# Patient Record
Sex: Female | Born: 1950 | Race: Black or African American | Hispanic: No | Marital: Married
Health system: Southern US, Community
[De-identification: ages and names within clinical notes are randomized; demographics above are authoritative.]

## PROBLEM LIST (undated history)

## (undated) DIAGNOSIS — Z923 Personal history of irradiation: Secondary | ICD-10-CM

## (undated) DIAGNOSIS — E119 Type 2 diabetes mellitus without complications: Secondary | ICD-10-CM

## (undated) DIAGNOSIS — Z8669 Personal history of other diseases of the nervous system and sense organs: Secondary | ICD-10-CM

## (undated) DIAGNOSIS — M1990 Unspecified osteoarthritis, unspecified site: Secondary | ICD-10-CM

## (undated) DIAGNOSIS — G4710 Hypersomnia, unspecified: Secondary | ICD-10-CM

## (undated) DIAGNOSIS — T7840XA Allergy, unspecified, initial encounter: Secondary | ICD-10-CM

## (undated) DIAGNOSIS — Z9989 Dependence on other enabling machines and devices: Secondary | ICD-10-CM

## (undated) DIAGNOSIS — G4733 Obstructive sleep apnea (adult) (pediatric): Secondary | ICD-10-CM

## (undated) DIAGNOSIS — R42 Dizziness and giddiness: Secondary | ICD-10-CM

## (undated) DIAGNOSIS — Z9221 Personal history of antineoplastic chemotherapy: Secondary | ICD-10-CM

## (undated) DIAGNOSIS — K5790 Diverticulosis of intestine, part unspecified, without perforation or abscess without bleeding: Secondary | ICD-10-CM

## (undated) DIAGNOSIS — I10 Essential (primary) hypertension: Secondary | ICD-10-CM

## (undated) DIAGNOSIS — C50911 Malignant neoplasm of unspecified site of right female breast: Secondary | ICD-10-CM

## (undated) DIAGNOSIS — Z8543 Personal history of malignant neoplasm of ovary: Secondary | ICD-10-CM

## (undated) DIAGNOSIS — Z9889 Other specified postprocedural states: Secondary | ICD-10-CM

## (undated) DIAGNOSIS — R112 Nausea with vomiting, unspecified: Secondary | ICD-10-CM

## (undated) DIAGNOSIS — J309 Allergic rhinitis, unspecified: Secondary | ICD-10-CM

## (undated) DIAGNOSIS — F329 Major depressive disorder, single episode, unspecified: Secondary | ICD-10-CM

## (undated) DIAGNOSIS — Z8489 Family history of other specified conditions: Secondary | ICD-10-CM

## (undated) DIAGNOSIS — G629 Polyneuropathy, unspecified: Secondary | ICD-10-CM

## (undated) DIAGNOSIS — F32A Depression, unspecified: Secondary | ICD-10-CM

## (undated) DIAGNOSIS — E785 Hyperlipidemia, unspecified: Secondary | ICD-10-CM

## (undated) DIAGNOSIS — Z8639 Personal history of other endocrine, nutritional and metabolic disease: Secondary | ICD-10-CM

## (undated) HISTORY — DX: Allergy, unspecified, initial encounter: T78.40XA

## (undated) HISTORY — PX: TUBAL LIGATION: SHX77

## (undated) HISTORY — PX: TONSILLECTOMY: SUR1361

## (undated) HISTORY — PX: EYE SURGERY: SHX253

## (undated) HISTORY — DX: Essential (primary) hypertension: I10

## (undated) HISTORY — PX: COLONOSCOPY: SHX174

## (undated) HISTORY — DX: Major depressive disorder, single episode, unspecified: F32.9

## (undated) HISTORY — DX: Personal history of other endocrine, nutritional and metabolic disease: Z86.39

## (undated) HISTORY — DX: Personal history of other diseases of the nervous system and sense organs: Z86.69

## (undated) HISTORY — DX: Hypersomnia, unspecified: G47.10

## (undated) HISTORY — DX: Allergic rhinitis, unspecified: J30.9

## (undated) HISTORY — DX: Depression, unspecified: F32.A

## (undated) HISTORY — DX: Hyperlipidemia, unspecified: E78.5

## (undated) HISTORY — DX: Obstructive sleep apnea (adult) (pediatric): G47.33

## (undated) HISTORY — DX: Type 2 diabetes mellitus without complications: E11.9

## (undated) HISTORY — DX: Personal history of malignant neoplasm of ovary: Z85.43

## (undated) HISTORY — DX: Dependence on other enabling machines and devices: Z99.89

---

## 1993-02-02 HISTORY — PX: TOTAL ABDOMINAL HYSTERECTOMY W/ BILATERAL SALPINGOOPHORECTOMY: SHX83

## 1995-09-05 HISTORY — PX: ABDOMINAL ADHESION SURGERY: SHX90

## 1995-09-05 HISTORY — PX: LAPAROSCOPIC SALPINGO OOPHERECTOMY: SHX5927

## 1998-10-05 HISTORY — PX: BREAST BIOPSY: SHX20

## 1999-04-25 ENCOUNTER — Other Ambulatory Visit: Admission: RE | Admit: 1999-04-25 | Discharge: 1999-04-25 | Payer: Self-pay | Admitting: Obstetrics and Gynecology

## 1999-05-06 ENCOUNTER — Other Ambulatory Visit: Admission: RE | Admit: 1999-05-06 | Discharge: 1999-05-06 | Payer: Self-pay | Admitting: Podiatry

## 1999-06-16 ENCOUNTER — Encounter (HOSPITAL_BASED_OUTPATIENT_CLINIC_OR_DEPARTMENT_OTHER): Payer: Self-pay | Admitting: General Surgery

## 1999-06-16 ENCOUNTER — Ambulatory Visit (HOSPITAL_COMMUNITY): Admission: RE | Admit: 1999-06-16 | Discharge: 1999-06-16 | Payer: Self-pay | Admitting: General Surgery

## 1999-12-30 ENCOUNTER — Ambulatory Visit (HOSPITAL_COMMUNITY): Admission: RE | Admit: 1999-12-30 | Discharge: 1999-12-30 | Payer: Self-pay | Admitting: General Surgery

## 1999-12-30 ENCOUNTER — Encounter (HOSPITAL_BASED_OUTPATIENT_CLINIC_OR_DEPARTMENT_OTHER): Payer: Self-pay | Admitting: General Surgery

## 2000-09-21 ENCOUNTER — Encounter (INDEPENDENT_AMBULATORY_CARE_PROVIDER_SITE_OTHER): Payer: Self-pay

## 2000-09-21 ENCOUNTER — Other Ambulatory Visit: Admission: RE | Admit: 2000-09-21 | Discharge: 2000-09-21 | Payer: Self-pay | Admitting: Internal Medicine

## 2001-05-13 ENCOUNTER — Encounter: Admission: RE | Admit: 2001-05-13 | Discharge: 2001-08-11 | Payer: Self-pay | Admitting: Family Medicine

## 2001-08-15 ENCOUNTER — Emergency Department (HOSPITAL_COMMUNITY): Admission: EM | Admit: 2001-08-15 | Discharge: 2001-08-15 | Payer: Self-pay | Admitting: Emergency Medicine

## 2001-08-24 ENCOUNTER — Encounter: Admission: RE | Admit: 2001-08-24 | Discharge: 2001-11-22 | Payer: Self-pay | Admitting: Family Medicine

## 2001-08-25 ENCOUNTER — Encounter: Payer: Self-pay | Admitting: Obstetrics and Gynecology

## 2001-08-25 ENCOUNTER — Ambulatory Visit (HOSPITAL_COMMUNITY): Admission: RE | Admit: 2001-08-25 | Discharge: 2001-08-25 | Payer: Self-pay | Admitting: Obstetrics and Gynecology

## 2002-01-03 ENCOUNTER — Encounter: Admission: RE | Admit: 2002-01-03 | Discharge: 2002-04-03 | Payer: Self-pay | Admitting: Family Medicine

## 2002-06-22 ENCOUNTER — Encounter: Admission: RE | Admit: 2002-06-22 | Discharge: 2002-09-20 | Payer: Self-pay | Admitting: Family Medicine

## 2002-07-05 HISTORY — PX: BREAST REDUCTION SURGERY: SHX8

## 2002-10-05 HISTORY — PX: REDUCTION MAMMAPLASTY: SUR839

## 2002-11-06 ENCOUNTER — Encounter: Admission: RE | Admit: 2002-11-06 | Discharge: 2003-02-04 | Payer: Self-pay | Admitting: Family Medicine

## 2003-01-17 ENCOUNTER — Emergency Department (HOSPITAL_COMMUNITY): Admission: EM | Admit: 2003-01-17 | Discharge: 2003-01-17 | Payer: Self-pay | Admitting: Emergency Medicine

## 2003-01-17 ENCOUNTER — Encounter: Payer: Self-pay | Admitting: Emergency Medicine

## 2003-04-16 ENCOUNTER — Encounter: Admission: RE | Admit: 2003-04-16 | Discharge: 2003-07-15 | Payer: Self-pay | Admitting: Family Medicine

## 2003-05-10 ENCOUNTER — Encounter (HOSPITAL_BASED_OUTPATIENT_CLINIC_OR_DEPARTMENT_OTHER): Payer: Self-pay | Admitting: General Surgery

## 2003-05-10 ENCOUNTER — Ambulatory Visit (HOSPITAL_COMMUNITY): Admission: RE | Admit: 2003-05-10 | Discharge: 2003-05-10 | Payer: Self-pay | Admitting: General Surgery

## 2003-05-18 ENCOUNTER — Encounter (HOSPITAL_BASED_OUTPATIENT_CLINIC_OR_DEPARTMENT_OTHER): Payer: Self-pay | Admitting: General Surgery

## 2003-05-18 ENCOUNTER — Encounter: Admission: RE | Admit: 2003-05-18 | Discharge: 2003-05-18 | Payer: Self-pay | Admitting: General Surgery

## 2003-07-19 ENCOUNTER — Encounter: Admission: RE | Admit: 2003-07-19 | Discharge: 2003-10-17 | Payer: Self-pay | Admitting: Family Medicine

## 2004-03-31 ENCOUNTER — Ambulatory Visit (HOSPITAL_BASED_OUTPATIENT_CLINIC_OR_DEPARTMENT_OTHER): Admission: RE | Admit: 2004-03-31 | Discharge: 2004-03-31 | Payer: Self-pay | Admitting: Family Medicine

## 2004-11-17 ENCOUNTER — Emergency Department (HOSPITAL_COMMUNITY): Admission: EM | Admit: 2004-11-17 | Discharge: 2004-11-18 | Payer: Self-pay | Admitting: Emergency Medicine

## 2004-12-03 ENCOUNTER — Encounter: Admission: RE | Admit: 2004-12-03 | Discharge: 2004-12-03 | Payer: Self-pay | Admitting: Obstetrics and Gynecology

## 2004-12-26 ENCOUNTER — Ambulatory Visit: Payer: Self-pay | Admitting: Internal Medicine

## 2005-01-11 ENCOUNTER — Ambulatory Visit (HOSPITAL_BASED_OUTPATIENT_CLINIC_OR_DEPARTMENT_OTHER): Admission: RE | Admit: 2005-01-11 | Discharge: 2005-01-11 | Payer: Self-pay | Admitting: Internal Medicine

## 2005-01-18 ENCOUNTER — Ambulatory Visit: Payer: Self-pay | Admitting: Internal Medicine

## 2005-01-23 ENCOUNTER — Ambulatory Visit: Payer: Self-pay | Admitting: Internal Medicine

## 2005-02-12 ENCOUNTER — Ambulatory Visit (HOSPITAL_BASED_OUTPATIENT_CLINIC_OR_DEPARTMENT_OTHER): Admission: RE | Admit: 2005-02-12 | Discharge: 2005-02-12 | Payer: Self-pay | Admitting: Internal Medicine

## 2005-02-26 ENCOUNTER — Ambulatory Visit: Payer: Self-pay | Admitting: Internal Medicine

## 2005-03-24 ENCOUNTER — Ambulatory Visit: Payer: Self-pay | Admitting: Internal Medicine

## 2005-04-03 ENCOUNTER — Ambulatory Visit: Payer: Self-pay

## 2005-04-23 ENCOUNTER — Ambulatory Visit: Payer: Self-pay | Admitting: Internal Medicine

## 2005-06-18 ENCOUNTER — Ambulatory Visit: Payer: Self-pay | Admitting: Internal Medicine

## 2005-07-21 ENCOUNTER — Ambulatory Visit: Payer: Self-pay | Admitting: Internal Medicine

## 2005-07-28 ENCOUNTER — Ambulatory Visit: Admission: RE | Admit: 2005-07-28 | Discharge: 2005-07-28 | Payer: Self-pay | Admitting: Gynecologic Oncology

## 2005-08-19 ENCOUNTER — Emergency Department (HOSPITAL_COMMUNITY): Admission: EM | Admit: 2005-08-19 | Discharge: 2005-08-19 | Payer: Self-pay | Admitting: Emergency Medicine

## 2006-01-19 ENCOUNTER — Ambulatory Visit: Payer: Self-pay | Admitting: Internal Medicine

## 2006-01-29 ENCOUNTER — Encounter: Admission: RE | Admit: 2006-01-29 | Discharge: 2006-01-29 | Payer: Self-pay | Admitting: Obstetrics and Gynecology

## 2006-04-08 ENCOUNTER — Encounter: Admission: RE | Admit: 2006-04-08 | Discharge: 2006-04-08 | Payer: Self-pay | Admitting: Obstetrics & Gynecology

## 2006-04-21 ENCOUNTER — Encounter: Admission: RE | Admit: 2006-04-21 | Discharge: 2006-04-21 | Payer: Self-pay | Admitting: Internal Medicine

## 2006-06-11 ENCOUNTER — Encounter: Admission: RE | Admit: 2006-06-11 | Discharge: 2006-06-11 | Payer: Self-pay | Admitting: Internal Medicine

## 2006-09-22 ENCOUNTER — Ambulatory Visit: Admission: RE | Admit: 2006-09-22 | Discharge: 2006-09-22 | Payer: Self-pay | Admitting: Gynecologic Oncology

## 2006-11-05 HISTORY — PX: LAPAROSCOPIC SALPINGO OOPHERECTOMY: SHX5927

## 2006-11-05 HISTORY — PX: LAPAROSCOPY ABDOMEN DIAGNOSTIC: PRO50

## 2007-01-04 ENCOUNTER — Ambulatory Visit: Admission: RE | Admit: 2007-01-04 | Discharge: 2007-01-04 | Payer: Self-pay | Admitting: Gynecologic Oncology

## 2007-02-01 ENCOUNTER — Encounter: Admission: RE | Admit: 2007-02-01 | Discharge: 2007-02-01 | Payer: Self-pay | Admitting: Obstetrics and Gynecology

## 2007-08-03 ENCOUNTER — Ambulatory Visit: Admission: RE | Admit: 2007-08-03 | Discharge: 2007-08-03 | Payer: Self-pay | Admitting: Gynecologic Oncology

## 2007-12-16 DIAGNOSIS — E669 Obesity, unspecified: Secondary | ICD-10-CM | POA: Insufficient documentation

## 2007-12-16 DIAGNOSIS — J309 Allergic rhinitis, unspecified: Secondary | ICD-10-CM | POA: Insufficient documentation

## 2007-12-16 DIAGNOSIS — G4733 Obstructive sleep apnea (adult) (pediatric): Secondary | ICD-10-CM | POA: Insufficient documentation

## 2007-12-16 DIAGNOSIS — E119 Type 2 diabetes mellitus without complications: Secondary | ICD-10-CM | POA: Insufficient documentation

## 2007-12-16 DIAGNOSIS — E7800 Pure hypercholesterolemia, unspecified: Secondary | ICD-10-CM | POA: Insufficient documentation

## 2007-12-29 ENCOUNTER — Ambulatory Visit: Payer: Self-pay | Admitting: Internal Medicine

## 2007-12-30 ENCOUNTER — Ambulatory Visit: Payer: Self-pay | Admitting: Internal Medicine

## 2007-12-30 DIAGNOSIS — I10 Essential (primary) hypertension: Secondary | ICD-10-CM | POA: Insufficient documentation

## 2007-12-30 DIAGNOSIS — G4710 Hypersomnia, unspecified: Secondary | ICD-10-CM | POA: Insufficient documentation

## 2007-12-30 DIAGNOSIS — E785 Hyperlipidemia, unspecified: Secondary | ICD-10-CM | POA: Insufficient documentation

## 2007-12-30 DIAGNOSIS — G4700 Insomnia, unspecified: Secondary | ICD-10-CM | POA: Insufficient documentation

## 2008-01-20 ENCOUNTER — Ambulatory Visit (HOSPITAL_COMMUNITY): Admission: RE | Admit: 2008-01-20 | Discharge: 2008-01-20 | Payer: Self-pay | Admitting: Internal Medicine

## 2008-01-31 ENCOUNTER — Ambulatory Visit: Payer: Self-pay | Admitting: Internal Medicine

## 2008-02-01 ENCOUNTER — Encounter: Payer: Self-pay | Admitting: Internal Medicine

## 2008-02-06 ENCOUNTER — Encounter: Admission: RE | Admit: 2008-02-06 | Discharge: 2008-02-06 | Payer: Self-pay | Admitting: Internal Medicine

## 2008-03-20 ENCOUNTER — Ambulatory Visit: Payer: Self-pay | Admitting: Internal Medicine

## 2008-04-19 ENCOUNTER — Ambulatory Visit: Payer: Self-pay | Admitting: Internal Medicine

## 2008-05-21 ENCOUNTER — Encounter: Payer: Self-pay | Admitting: Internal Medicine

## 2009-01-09 ENCOUNTER — Ambulatory Visit: Admission: RE | Admit: 2009-01-09 | Discharge: 2009-01-09 | Payer: Self-pay | Admitting: Gynecologic Oncology

## 2009-02-11 ENCOUNTER — Encounter: Admission: RE | Admit: 2009-02-11 | Discharge: 2009-02-11 | Payer: Self-pay | Admitting: Internal Medicine

## 2009-03-15 ENCOUNTER — Ambulatory Visit: Payer: Self-pay | Admitting: Internal Medicine

## 2009-10-03 ENCOUNTER — Encounter: Payer: Self-pay | Admitting: Internal Medicine

## 2009-10-21 ENCOUNTER — Encounter: Payer: Self-pay | Admitting: Internal Medicine

## 2009-12-09 ENCOUNTER — Telehealth: Payer: Self-pay | Admitting: Internal Medicine

## 2010-01-06 ENCOUNTER — Encounter: Payer: Self-pay | Admitting: Internal Medicine

## 2010-01-10 ENCOUNTER — Telehealth: Payer: Self-pay | Admitting: Internal Medicine

## 2010-02-03 ENCOUNTER — Encounter: Payer: Self-pay | Admitting: Internal Medicine

## 2010-02-19 ENCOUNTER — Encounter: Admission: RE | Admit: 2010-02-19 | Discharge: 2010-02-19 | Payer: Self-pay | Admitting: Internal Medicine

## 2010-05-13 ENCOUNTER — Ambulatory Visit: Payer: Self-pay | Admitting: Internal Medicine

## 2010-07-21 ENCOUNTER — Encounter
Admission: RE | Admit: 2010-07-21 | Discharge: 2010-08-19 | Payer: Self-pay | Source: Home / Self Care | Attending: Endocrinology | Admitting: Endocrinology

## 2010-11-06 NOTE — Miscellaneous (Signed)
Summary: noshow appt 01-30-08  Clinical Lists Changes  pt noshowed for appointment with Dr. Maple Hudson 01-30-08, has not rescheduled. Boone Master CNA  February 01, 2008 2:42 PM

## 2010-11-06 NOTE — Medication Information (Signed)
Summary: Denied/[facility]co  Denied/[facility]   Imported By: Lester Landover 11/21/2009 07:02:22  _____________________________________________________________________  External Attachment:    Type:   Image     Comment:   External Document

## 2010-11-06 NOTE — Medication Information (Signed)
Summary: Prior autho for Nuvigil/[facility]  Prior autho for Nuvigil/[facility]   Imported By: Sherian Rein 10/23/2009 12:06:33  _____________________________________________________________________  External Attachment:    Type:   Image     Comment:   External Document

## 2010-11-06 NOTE — Assessment & Plan Note (Signed)
Summary: rov 1 month////kp   Visit Type:  Follow-up PCP:  Avebeure  Chief Complaint:  1 month follow up-still sleepy.  History of Present Illness: Current Problems:  SLEEP APNEA, OBSTRUCTIVE (ICD-327.23) INSOMNIA (ICD-780.52) ALLERGIC RHINITIS (ICD-477.9) HYPERTENSION (ICD-401.9) HYPERLIPIDEMIA (ICD-272.4) DIABETES, TYPE 2 (ICD-250.00) HYPERSOMNIA (ICD-780.54) EXOGENOUS OBESITY (ICD-278.00) HYPERCHOLESTEROLEMIA (ICD-272.0) DIABETES MELLITUS (ICD-40.24)  60 year old woman returning for follow-up of sleep apnea with insomnia, complicated by allergic rhinitis.  She is using her CPAP machine  longer each night.  She avoids driving and uses bus to avoid risk of daytime sleepiness.  She failed Ritalin and aAdderall and still fights daytime sleepiness.  Ambien, CR 12.5 mg helps consolidate sleep.  Nuvigil has been some help in the daytime, sometimes needing two tablets.  She denies syncope, headache, palpitation, nausea or vomiting.  She does complain of waking after sleep onset and of daytime sleepiness.  I do not get a history suggesting cataplexy or sleep paralysis.       Prior Medications Reviewed Using: Patient Recall  Updated Prior Medication List: LIPITOR 40 MG  TABS (ATORVASTATIN CALCIUM) take 1 by mouth at bedtime DIOVAN HCT 320-25 MG  TABS (VALSARTAN-HYDROCHLOROTHIAZIDE) take 1 by mouth once daily * CPAP 13  LEVEMIR FLEXPEN 100 UNIT/ML  SOLN (INSULIN DETEMIR) take 20 units at bedtime TOPROL XL 50 MG  TB24 (METOPROLOL SUCCINATE) once daily PROVIGIL 200 MG  TABS (MODAFINIL) take 1 by mouth once daily AVANDAMET 4-500 MG  TABS (ROSIGLITAZONE-METFORMIN) take 1 by mouth two times a day AMBIEN CR 12.5 MG  TBCR (ZOLPIDEM TARTRATE) 1 at bedtime * NUVIGIL 150 MG  TABS (ARMODAFINIL) 1 daily as needed  Current Allergies (reviewed today): No known allergies   Past Medical History:    Reviewed history from 12/30/2007 and no changes required:       Allergic Rhinitis  Diabetes, Type 2       Hyperlipidemia       Hypertension       Sleep Apnea   Social History:    Reviewed history from 12/30/2007 and no changes required:       Patient never smoked.     Review of Systems      See HPI   Vital Signs:  Patient Profile:   60 Years Old Female Weight:      249.50 pounds O2 Sat:      99 % O2 treatment:    Room Air Pulse rate:   80 / minute BP sitting:   114 / 74  (left arm) Cuff size:   regular  Vitals Entered By: Reynaldo Minium CMA (April 19, 2008 3:50 PM)                 Physical Exam  GENERAL:  A/Ox3; pleasant & cooperative.NAD, obese. Neuro grossly intact to observation. HEENT:  [place]/AT, EOM-wnl, PERRLA, EACs-clear, TMs-wnl, NOSE-clear, THROAT-clear & wnl. NECK:  Supple w/ fair ROM; no JVD; normal carotid impulses w/o bruits; no thyromegaly or nodules palpated; no lymphadenopathy. CHEST: Clear to P&A HEART:  RRR, no m/r/g  heard ABDOMEN:  Soft & nt; nml bowel sounds; no organomegaly or masses detected. EXT: Warm bilat,  no calf pain, edema, clubbing, pulses intact Skin: no rash/lesion        Impression & Recommendations:  Problem # 1:  SLEEP APNEA, OBSTRUCTIVE (ICD-327.23) weight loss would help.  Continued efforts at improving CPAP compliance and comfort.  Problem # 2:  INSOMNIA (ICD-780.52) complaints of insomnia with difficulty initiating and maintaining sleep, but also complains of significant  daytime sleepiness, requiring stimulant medication and naps.  We are going to auto titrate CPAP for pressure check, try samples of Nuvigil 250 mg and continue Ambien C. R. with sleep hygiene efforts at night.  If problems persist, consider multiple sleep latency test. Her updated medication list for this problem includes:    Ambien Cr 12.5 Mg Tbcr (Zolpidem tartrate) .Marland Kitchen... 1 at bedtime    Patient Instructions: 1)  Please schedule a follow-up appointment in 3 months. 2)  Try samples of Nuvigil 250, one daily   ]

## 2010-11-06 NOTE — Progress Notes (Signed)
Summary: nuvigil rx-  Phone Note Call from Patient Call back at Home Phone [phone]   Caller: Patient Call For: Jessilyn Catino Reason for Call: Talk to Nurse Summary of Call: Patient is requesting to speak to nurse about nuvigil rx.   Initial call taken by: Lehman Prom,  December 09, 2009 12:14 PM  Follow-up for Phone Call        [facility]. Carron Curie CMA  December 09, 2009 12:34 PM   Additional Follow-up for Phone Call Additional follow up Details #1::        [facility] had denied Rx for Nuvigil back in January according to note in chart.  Pt has not had meds since then and is c/o being extremely sleepy all the time.  Pt reports that her out of pocket for this med would be $90 and she will pay this if she has to but would prefer not to.  Please advise on what to do next. Abigail Miyamoto RN  December 09, 2009 1:04 PM     Additional Follow-up for Phone Call Additional follow up Details #2::    I have dictated an appeal letter. Follow-up by: Waymon Budge MD,  December 09, 2009 5:31 PM  Additional Follow-up for Phone Call Additional follow up Details #3:: Details for Additional Follow-up Action Taken: [facility] to advised appeal letter dictated. Carron Curie CMA  December 10, 2009 9:23 AM  Pt advised that appeal letter dictated and faxed to ins company. Carron Curie CMA  December 11, 2009 10:06 AM

## 2010-11-06 NOTE — Medication Information (Signed)
Summary: Nuvigil/[facility]  Nuvigil/[facility]   Imported By: Sherian Rein 02/13/2010 09:33:08  _____________________________________________________________________  External Attachment:    Type:   Image     Comment:   External Document

## 2010-11-06 NOTE — Assessment & Plan Note (Signed)
Summary: follow up/ mbw   PCP:  Avebeure  Chief Complaint:  follow up visit.  History of Present Illness: Current Problems:  SLEEP APNEA (ICD-780.57) HYPERTENSION (ICD-401.9) HYPERLIPIDEMIA (ICD-272.4) DIABETES, TYPE 2 (ICD-250.00) HYPERSOMNIA (ICD-780.54) INSOMNIA (ICD-780.52) EXOGENOUS OBESITY (ICD-278.00) ALLERGIC RHINITIS (ICD-477.9) SLEEP APNEA, OBSTRUCTIVE (ICD-327.23) HYPERCHOLESTEROLEMIA (ICD-272.0) DIABETES MELLITUS (ICD-250.00) HYPERTENSION NEC (ICD-997.91)       Current Allergies (reviewed today): No known allergies       Vital Signs:  Patient Profile:   60 Years Old Female Weight:      244.13 pounds O2 Sat:      100 % O2 treatment:    Room Air Pulse rate:   54 / minute BP sitting:   126 / 72  (left arm) Cuff size:   large  Vitals Entered By: Reynaldo Minium CMA (March 20, 2008 10:41 AM)             Comments Medications reviewed with patient Reynaldo Minium CMA  March 20, 2008 10:41 AM         Medications Added to Medication List This Visit: 1)  Lipitor 40 Mg Tabs (Atorvastatin calcium) .... Take 1 by mouth at bedtime 2)  Diovan Hct 320-25 Mg Tabs (Valsartan-hydrochlorothiazide) .... Take 1 by mouth once daily 3)  Levemir Flexpen 100 Unit/ml Soln (Insulin detemir) .... Take 20 units at bedtime 4)  Toprol Xl 50 Mg Tb24 (Metoprolol succinate) .... Once daily 5)  Provigil 200 Mg Tabs (Modafinil) .... Take 1 by mouth once daily 6)  Avandamet 4-500 Mg Tabs (Rosiglitazone-metformin) .... Take 1 by mouth two times a day 7)  Ambien Cr 12.5 Mg Tbcr (Zolpidem tartrate) .Marland Kitchen.. 1 at bedtime 8)  Nuvigil 250 Mg Tabs (Armodafinil) .Marland Kitchen.. 1 daily as needed 9)  Nuvigil 150 Mg Tabs (armodafinil)  .Marland Kitchen.. 1 daily as needed   Patient Instructions: 1)  Please schedule a follow-up appointment in 1 month. 2)  Please call us the strength of your Ambien 3)  Try Nuvigil in the morning instead of Provigil   Prescriptions: NUVIGIL 250 MG  TABS (ARMODAFINIL) 1 daily as  needed  #30 x 5   Entered and Authorized by:   Waymon Budge MD   Signed by:   Waymon Budge MD on 03/20/2008   Method used:   Print then Give to Patient   RxID:   [id number]  ]  Appended Document: follow up/ mbw Office note signed prematurely. addendum to repair documentation.    Visit Type:  Follow-up PCP:  Avebeure   History of Present Illness: Current Problems:  SLEEP APNEA, OBSTRUCTIVE (ICD-327.23) INSOMNIA (ICD-780.52) ALLERGIC RHINITIS (ICD-477.9) HYPERTENSION (ICD-401.9) HYPERLIPIDEMIA (ICD-272.4) DIABETES, TYPE 2 (ICD-250.00) HYPERSOMNIA (ICD-780.54) EXOGENOUS OBESITY (ICD-278.00) HYPERCHOLESTEROLEMIA (ICD-272.0) DIABETES MELLITUS (ICD-51.2)  60 year old woman with sleep apnea, and rhinitis, returning for follow-up.  She fights, daytime sleepiness when she sits.  She had been given Ambien C. R., hoping to consolidate nighttime sleep.  She is afraid to drive in the morning and uses a bus.  She is trying to wear CPAP at 13 but takes it off after a few hours.  She tosses and turns in her sleep. If  She takes two ambiens she can sleep through the night fairly well, but she still wakes at night repeatedly if she only takes one.  Provigil has worked well in the morning to maintain daytime alertness.  She has run out of samples.  We discussed sleep hygiene, and CPAP.  We discussed sleep medications and stimulants, and I emphasize strongly her  responsibility to drive safely or not to drive.     Current Allergies: No known allergies   Past Medical History:    Reviewed history from 12/30/2007 and no changes required:       Allergic Rhinitis       Diabetes, Type 2       Hyperlipidemia       Hypertension       Sleep Apnea   Social History:    Reviewed history from 12/30/2007 and no changes required:       Patient never smoked.     Review of Systems      See HPI    Physical Exam  GENERAL:  A/Ox3; pleasant & cooperative.NAD, obese HEENT:  [place]/AT,  EOM-wnl, PERRLA, EACs-clear, TMs-wnl, NOSE-clear, THROAT-clear & wnl. NECK:  Supple w/ fair ROM; no JVD; normal carotid impulses w/o bruits; no thyromegaly or nodules palpated; no lymphadenopathy. CHEST:Clear to P&A HEART:  RRR, no m/r/g  heard ABDOMEN:  Soft & nt; nml bowel sounds; no organomegaly or masses detected. EXT: Warm bilat,  no calf pain, edema, clubbing, pulses intact Skin: no rash/lesion       Impression & Recommendations:  Problem # 1:  SLEEP APNEA, OBSTRUCTIVE (ICD-327.23) we have a management problem with significant sleep apnea, unable to tolerate CPAP comfortably, unable to sleep soundly at night, and residual daytime sleepiness.  We discussed sleep hygiene, CPAP, driving responsibility and treatment options today.  We are going to try Nuvigil Orders: Est. Patient Level III (66440)    Patient Instructions: 1)  Please schedule a follow-up appointment in 1 month. 2)  Try Nuvigil 250, 1 qAM 3)  Consolidate this note into base note for this date.   ]

## 2010-11-06 NOTE — Assessment & Plan Note (Signed)
Summary: rov/ mbw   Primary Kathy Reese/Referring [name]:  Avebeure  CC:  Follow up visit-sleep and insomnia; still having trouble staying awake on some days; been taking 2 nuvigil tablets on those days.Kathy Reese  History of Present Illness: 04/19/08- 60 year old woman returning for follow-up of sleep apnea with insomnia, complicated by allergic rhinitis.  She is using her CPAP machine  longer each night.  She avoids driving and uses bus to avoid risk of daytime sleepiness.  She failed Ritalin and aAdderall and still fights daytime sleepiness.  Ambien, CR 12.5 mg helps consolidate sleep.  Nuvigil has been some help in the daytime, sometimes needing two tablets.  She denies syncope, headache, palpitation, nausea or vomiting.  She does complain of waking after sleep onset and of daytime sleepiness.  I do not get a history suggesting cataplexy or sleep paralysis.  2009/03/17- OSA Continues to wake at lest briefly at night despite CPAP, but main c/o is irresistable daytime somnolence. She failed ritalin and adderall. Nuvigil worked well at 150 mg daily. No headache or intolerance problems.  Ambien helped night time sleep, but insurance no longer covers., but only helped draytime sleepiness "some'".Denies cataplexy or sleep paralysis. Some lightheaded if she bends over.  May 13, 2010- OSA, Hypersomnia Still uses the CPAP every night. She still fights sleepiness- finding she will go to sleep anytime she sits. Takes Nuvigil two times a day - works very well for about 5-6 hours. Takes usually 8AM and 3PM. No headache or palpitation. She likes Nuvigil much better than ritalin or adderall tried prevously. She is always sleepy. She tries some days to get by on one pill. She will take a nap most days after work, for 1-2 hours. Denies cataplexy, sleep paralysis.    Preventive Screening-Counseling & Management  Alcohol-Tobacco     Smoking Status: never  Current Medications (verified): 1)  Lipitor 40 Mg  Tabs  (Atorvastatin Calcium) .... Take 1 By Mouth At Bedtime 2)  Diovan Hct 320-25 Mg  Tabs (Valsartan-Hydrochlorothiazide) .... Take 1 By Mouth Once Daily 3)  Cpap 13 .... Ahc 4)  Levemir Flexpen 100 Unit/ml  Soln (Insulin Detemir) .... Take 20 Units At Bedtime 5)  Toprol Xl 50 Mg  Tb24 (Metoprolol Succinate) .... Once Daily 6)  Nuvigil 150 Mg  Tabs (Armodafinil) .Kathy Reese.. 1 Daily As Needed  Allergies (verified): No Known Drug Allergies  Past History:  Past Surgical History: Last updated: 03-17-09 T A H and B S O Tonsillectomy Plantar fasciitis  Family History: Last updated: 03-17-2009 Mother - died lung cancer- smoked Father- died colon cancer  Social History: Last updated: 2009/03/17 Patient never smoked.  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    Vital Signs:  Patient profile:   60 year old female Height:      64 inches Weight:      257.13 pounds BMI:     44.30 O2 Sat:      91 % on Room air Pulse rate:   68 / minute BP sitting:   130 / 68  (left arm) Cuff size:   large  Vitals Entered By: Florentina Addison  Welchel CMA (May 13, 2010 3:54 PM)  O2 Flow:  Room air CC: Follow up visit-sleep and insomnia; still having trouble staying awake on some days; been taking 2 nuvigil tablets on those days.   Physical Exam  Additional Exam:  GENERAL:  A/Ox3; pleasant & cooperative.NAD, obese.  Neuro grossly intact to observation. Calm and appropriate HEENT:  [place]/AT, EOM-wnl, PERRLA, EACs-clear, TMs-wnl, NOSE-clear,  THROAT-clear & wnl. NECK:  Supple w/ fair ROM; no JVD; normal carotid impulses w/o bruits; no thyromegaly or nodules palpated; no lymphadenopathy. CHEST: Clear to P&A HEART:  RRR, no m/r/g  heard ABDOMEN:  Soft & nl, overweight EXT: Warm bilat,  no calf pain, edema, clubbing, pulses intact Skin: no rash/lesion     Impression & Recommendations:  Problem # 1:  SLEEP APNEA, OBSTRUCTIVE (ICD-327.23)  Good cpap control and compliance.  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 Ronne Binning is working for her and is the best treatment I currently can offer.   Problem # 2:  HYPERSOMNIA (ICD-780.54) Discussion as above. We again reviewed basic sleep hygiene. It sounds as if she is spending generous amounts of time asleep.  Medications Added to Medication List This Visit: 1)  Cpap 13  .... Ahc  Other Orders: Consultation Level IV (16109) Est. Patient Level III (60454)  Patient Instructions: 1)  Please schedule a follow-up appointment in 6 months. 2)  Call sooner if needed

## 2010-11-06 NOTE — Medication Information (Signed)
Summary: Tax adviser   Imported By: Valinda Hoar 01/06/2010 07:54:57  _____________________________________________________________________  External Attachment:    Type:   Image     Comment:   External Document

## 2010-11-06 NOTE — Assessment & Plan Note (Signed)
Summary: can't stay awake/using cpap/apc   Primary Provider/Referring Provider:  Avebeure  CC:  can't sleep; using CPAP;woke up two times last night..  History of Present Illness: 04/19/08- 60 year old woman returning for follow-up of sleep apnea with insomnia, complicated by allergic rhinitis.  She is using her CPAP machine  longer each night.  She avoids driving and uses bus to avoid risk of daytime sleepiness.  She failed Ritalin and aAdderall and still fights daytime sleepiness.  Ambien, CR 12.5 mg helps consolidate sleep.  Nuvigil has been some help in the daytime, sometimes needing two tablets.  She denies syncope, headache, palpitation, nausea or vomiting.  She does complain of waking after sleep onset and of daytime sleepiness.  I do not get a history suggesting cataplexy or sleep paralysis.  03/22/2009- OSA Continues to wake at lest briefly at night despite CPAP, but main c/o is irresistable daytime somnolence. She failed ritalin and adderall. Nuvigil worked well at 150 mg daily. No headache or intolerance problems.  Ambien helped night time sleep, but insurance no longer covers., but only helped draytime sleepiness "some'".Denies cataplexy or sleep paralysis. Some lightheaded if she bends over.    Current Medications (verified): 1)  Lipitor 40 Mg  Tabs (Atorvastatin Calcium) .... Take 1 By Mouth At Bedtime 2)  Diovan Hct 320-25 Mg  Tabs (Valsartan-Hydrochlorothiazide) .... Take 1 By Mouth Once Daily 3)  Cpap 13 4)  Levemir Flexpen 100 Unit/ml  Soln (Insulin Detemir) .... Take 20 Units At Bedtime 5)  Toprol Xl 50 Mg  Tb24 (Metoprolol Succinate) .... Once Daily 6)  Avandamet 4-500 Mg  Tabs (Rosiglitazone-Metformin) .... Take 1 By Mouth Two Times A Day 7)  Ambien Cr 12.5 Mg  Tbcr (Zolpidem Tartrate) .Marland Kitchen.. 1 At Bedtime 8)  Nuvigil 150 Mg  Tabs (Armodafinil) .Marland Kitchen.. 1 Daily As Needed  Allergies (verified): No Known Drug Allergies  Past History:  Past Medical History: Last updated:  12/30/2007 Allergic Rhinitis Diabetes, Type 2 Hyperlipidemia Hypertension Sleep Apnea  Family History: Last updated: 2009-03-22 Mother - died lung cancer- smoked Father- died colon cancer  Social History: Last updated: Mar 22, 2009 Patient never smoked.  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 Patient never smoked.  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 Comments Medications reviewed with patient Reynaldo Minium CMA  2009-03-22 2:36 PM    Physical Exam  Additional Exam:  GENERAL:  A/Ox3; pleasant & cooperative.NAD, obese. Neuro grossly intact to observation. HEENT:  [place]/AT, EOM-wnl, PERRLA, EACs-clear, TMs-wnl, NOSE-clear, THROAT-clear & wnl. NECK:  Supple w/ fair ROM; no JVD; normal carotid impulses w/o bruits; no thyromegaly or  nodules palpated; no lymphadenopathy. CHEST: Clear to P&A HEART:  RRR, no m/r/g  heard ABDOMEN:  Soft & nl EXT: Warm bilat,  no calf pain, edema, clubbing, pulses intact Skin: no rash/lesion     Impression & Recommendations:  Problem # 1:  SLEEP APNEA, OBSTRUCTIVE (ICD-327.23)  Significant residual hypersomnolence despite good compliance with cpap. She makes an effort at sleep hygiene. Best approachhas been to use Nuvigil- will refill  that.  Orders: Est. Patient Level III (04540)  Patient Instructions: 1)  Please schedule a follow-up appointment in 1 year. 2)  Script and card for Nuvigil 3)  Continue CPAP and be sure you are going to bed on time. Prescriptions: NUVIGIL 150 MG  TABS (ARMODAFINIL) 1 daily as needed  #30 x 5   Entered and Authorized by:   Waymon Budge MD   Signed by:   Waymon Budge MD on 03/15/2009   Method used:   Print then Give to Patient   RxID:   [id number]

## 2010-11-06 NOTE — Medication Information (Signed)
Summary: Denial/[facility]  Denial/[facility]   Imported By: Lester [place] 12/05/2009 10:31:41  _____________________________________________________________________  External Attachment:    Type:   Image     Comment:   External Document

## 2010-11-06 NOTE — Medication Information (Signed)
Summary: Nuvigil / [facility] - # [phone]  Nuvigil / [facility] - # [id number]   Imported By: Lennie Odor 10/10/2009 14:29:29  _____________________________________________________________________  External Attachment:    Type:   Image     Comment:   External Document

## 2010-11-06 NOTE — Progress Notes (Signed)
Summary: nuvigil request  Phone Note Call from Patient Call back at Home Phone [phone]   Caller: Patient Call For: Tenisha Fleece Summary of Call: pt states she received a letter stating that her ins has approved nuvigil. [facility] [address] # [phone].  Initial call taken by: Tivis Ringer, CNA,  January 10, 2010 10:33 AM  Follow-up for Phone Call        called pt pahrmacy adn advised nuvigil was approved. they ran it through and states copay will be $105. I called the pt to advise of this. She states she will think about htis an dlet [place] know if she is able to get med or not. Carron Curie CMA  January 10, 2010 11:57 AM

## 2010-11-10 ENCOUNTER — Encounter: Payer: Self-pay | Admitting: Internal Medicine

## 2010-11-10 ENCOUNTER — Ambulatory Visit (INDEPENDENT_AMBULATORY_CARE_PROVIDER_SITE_OTHER): Payer: Self-pay | Admitting: Internal Medicine

## 2010-11-10 DIAGNOSIS — G4733 Obstructive sleep apnea (adult) (pediatric): Secondary | ICD-10-CM

## 2010-11-10 DIAGNOSIS — G4700 Insomnia, unspecified: Secondary | ICD-10-CM

## 2010-11-10 DIAGNOSIS — G4710 Hypersomnia, unspecified: Secondary | ICD-10-CM

## 2010-11-20 NOTE — Assessment & Plan Note (Signed)
Summary: 6 MONTHS   Primary Provider/Referring Provider:  Teresa Pelton  CC:  6 month follow up visit-OSA; uses CPAP each night but still waking up in middle of night..  History of Present Illness: Mar 20, 2009- OSA Continues to wake at lest briefly at night despite CPAP, but main c/o is irresistable daytime somnolence. She failed ritalin and adderall. Nuvigil worked well at 150 mg daily. No headache or intolerance problems.  Ambien helped night time sleep, but insurance no longer covers., but only helped draytime sleepiness "some'".Denies cataplexy or sleep paralysis. Some lightheaded if she bends over.  May 13, 2010- OSA, Hypersomnia Still uses the CPAP every night. She still fights sleepiness- finding she will go to sleep anytime she sits. Takes Nuvigil two times a day - works very well for about 5-6 hours. Takes usually 8AM and 3PM. No headache or palpitation. She likes Nuvigil much better than ritalin or adderall tried prevously. She is always sleepy. She tries some days to get by on one pill. She will take a nap most days after work, for 1-2 hours. Denies cataplexy, sleep paralysis.  November 10, 2010-  OSA, Hypersomnia Nurse-CC:  6 month follow up visit-OSA; uses CPAP each night but still waking up in middle of night. CPAP unchanged and used every night. She doesn't feel that is waking her. Continues to need Nuvigil most days, once or twice, for daytime sleepiness. If a second dose, takes around 3PM. Bedtime around 9PM, up 4AM. She takes bus in AM because of sleepiness, for driving safety. No cataplexy.     Preventive Screening-Counseling & Management  Alcohol-Tobacco     Smoking Status: never  Current Medications (verified): 1)  Lipitor 40 Mg  Tabs (Atorvastatin Calcium) .... Take 1 By Mouth At Bedtime 2)  Diovan Hct 320-25 Mg  Tabs (Valsartan-Hydrochlorothiazide) .... Take 1 By Mouth Once Daily 3)  Cpap 13 .... Ahc 4)  Levemir Flexpen 100 Unit/ml  Soln (Insulin Detemir) .... Take 20  Units At Bedtime 5)  Toprol Xl 50 Mg  Tb24 (Metoprolol Succinate) .... Once Daily 6)  Nuvigil 150 Mg  Tabs (Armodafinil) .Marland Kitchen.. 1 Daily As Needed  Allergies (verified): No Known Drug Allergies  Past History:  Past Medical History: Last updated: 05/13/2010 Allergic Rhinitis Diabetes, Type 2 Hyperlipidemia Hypertension Sleep Apnea Hypersomnia  Past Surgical History: Last updated: 03-20-2009 T A H and B S O Tonsillectomy Plantar fasciitis  Family History: Last updated: 20-Mar-2009 Mother - died lung cancer- smoked Father- died colon cancer  Social History: Last updated: Mar 20, 2009 Patient never smoked.  Married  Customer service for [employer]  Risk Factors: Smoking Status: never (11/10/2010)  Review of Systems      See HPI  The patient denies anorexia, fever, weight loss, weight gain, vision loss, decreased hearing, hoarseness, chest pain, syncope, dyspnea on exertion, peripheral edema, prolonged cough, headaches, hemoptysis, abdominal pain, melena, severe indigestion/heartburn, enlarged lymph nodes, and angioedema.    Vital Signs:  Patient profile:   60 year old female Height:      64 inches Weight:      249.50 pounds BMI:     42.98 O2 Sat:      100 % on Room air Pulse rate:   64 / minute BP sitting:   114 / 74  (left arm) Cuff size:   large  Vitals Entered By: Reynaldo Minium CMA (November 10, 2010 4:10 PM)  O2 Flow:  Room air CC: 6 month follow up visit-OSA; uses CPAP each night but  still waking up in middle of night.   Physical Exam  Additional Exam:  GENERAL:  A/Ox3; pleasant & cooperative.NAD, obese.  Neuro grossly intact to observation. Calm and appropriate, fully alert and pleasant HEENT:  [place]/AT, EOM-wnl, PERRLA, EACs-clear, TMs-wnl, NOSE-clear, THROAT-clear & wnl. NECK:  Supple w/ fair ROM; no JVD; normal carotid impulses w/o bruits; no thyromegaly or nodules palpated; no lymphadenopathy. 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 She does snore through her CPAP at times, so we will try increasing pressure to 14. We will also try temazepam for sleep consolidation on an as needed basis. Emphasis on sleep hygiene, with question of inteviewing her husband for his ovbservations about her sleep.  Medications Added to Medication List This Visit: 1)  Cpap 14  .... Ahc 2)  Nuvigil 150 Mg Tabs (armodafinil)  .... 1, twice daily as needed 3)  Temazepam 15 Mg Caps (Temazepam) .Marland Kitchen.. 1 or 2 for sleep as needed  Other Orders: Est. Patient Level III (62130) DME Referral (DME)  Patient Instructions: 1)  Please schedule a follow-up appointment in 6 months. 2)  We will ask [facility] to increase your CPAP to 14 3)  Try temazepam for sleep as needed 4)  Ok to continue Nuvigil Prescriptions: TEMAZEPAM 15 MG CAPS (TEMAZEPAM) 1 or 2 for sleep as needed  #50 x 5   Entered and Authorized by:   Waymon Budge MD   Signed by:   Waymon Budge MD on 11/10/2010   Method used:   Print then Give to Patient   RxID:   [id number]

## 2011-02-17 NOTE — Assessment & Plan Note (Signed)
[facility]                         GASTROENTEROLOGY OFFICE NOTE   Kathy Reese, Kathy Reese                        MRN:          [id number]  DATE:12/29/2007                            DOB:          Mar 14, 1951    Kathy Reese is a 60 year old African American female who is to set up for  colonoscopy.  She has a positive family history of colon cancer in her  father and she is a personal history of tubular adenoma on colonoscopy  in December 2001.  Last colonoscopy was done in April 2004 and did not  show any recurrent polyps.  She denies any specific GI symptoms.  She is  here today to set up a colonoscopy and discussed the insulin management  prior to the colonoscopy.   MEDICATIONS:  1. Avandamet 4/500 1 p.o. b.i.d.  2. Diovan/Hydrochlorothiazide 320/25 mg p.o. daily.  3. Toprol XL 50 mg daily.  4. __________20 units nightly.  5. Lipitor 40 mg daily.  6. CPAP machine nightly.   PHYSICAL EXAMINATION:  Blood pressure 134/76.  Pulse 96.  Weight 242  pounds.  The patient was alert, oriented in no distress.  LUNGS:  Clear to auscultation.  CORE:  Normal S1 and normal S2.  ABDOMEN:  Obese, soft, nontender.  RECTAL:  Not done.   IMPRESSION:  A 60 year old African American female diabetic, insulin  dependent, with strong family history of colon cancer in close relative.  She due for colonoscopy.   PLAN:  1. Routine colonoscopic prep.  2. Reduce insulin dose to 10 units the night prior to the colonoscopy      and hold the insulin dose on the day of the colonoscopy, which will      be scheduled in the morning.  I have discussed the procedure,      sedation, and the prep with the patient.     Kathy Reese. Kathy Chance, MD  Electronically Signed    DMB/MedQ  DD: 12/29/2007  DT: 12/29/2007  Job #: [id number]   cc:   Duncan Dull, M.D.

## 2011-02-17 NOTE — Consult Note (Signed)
NAME:  Kathy Reese, Kathy Reese                 ACCOUNT NO.:  [id number]   MEDICAL RECORD NO.:  [id number]          PATIENT TYPE:  OUT   LOCATION:  GYN                          FACILITY:  [facility]   PHYSICIAN:  Paola A. Duard Brady, MD    DATE OF BIRTH:  10/17/1950   DATE OF CONSULTATION:  08/03/2007  DATE OF DISCHARGE:                                 CONSULTATION   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  She underwent diagnostic  laparoscopy, exploratory laparotomy, extensive lysis of adhesions on  December 03, 2006.  Operative findings included extensive adhesive  disease of the small bowel to the anterior abdominal wall, to the colon,  to the pelvic floor.  It was noted that she had a 4 x 5 cm multicystic  firm and nodular mass with minimal ascites.  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  She comes in today for  follow-up.  I last saw her in April 2008 at which time her exam was  unremarkable.  We had discussed hormone replacement therapy and she  declined.  She comes in today for follow-up.  She is overall doing quite  well and denies any significant complaints.  She did have some issues  with hemorrhoids, was seen by her primary care physician and was placed  on Citrucel which has helped her constipation symptoms and in turn has  helped significantly with her hemorrhoids.  She otherwise denies any  abdominal pain, pelvic pressure, increased abdominal girth.   Medication list is reviewed and is unchanged.   MAMMOGRAM AND COLONOSCOPY:  Mammogram is up to date.  Colonoscopy, she  is due.   FAMILY HISTORY:  There is no interval change.   REVIEW OF SYSTEMS:  She has any chest pain, shortness of breath, nausea,  vomiting, fevers, chills, headaches, visual changes significant change  in her bladder habits.  She does have the bowel issues as discussed  above.   PHYSICAL EXAMINATION:  Weight  235 pounds which is up 9 pounds from her  last visit, blood pressure 110/70.  Well-nourished, well-developed  female in no acute distress.  NECK:  Is supple.  There is no lymphadenopathy, no thyromegaly.  LUNGS:  Are clear to auscultation bilaterally.  CARDIOVASCULAR EXAM:  Regular rate and rhythm.  ABDOMEN:  Shows a well-healed vertical skin incision.  Abdomen is soft,  nontender, nondistended.  There is no palpable mass or  hepatosplenomegaly. Groins are negative for adenopathy.  EXTREMITIES:  There is no edema.  PELVIC:  External genitalia is within normal limits.  Bimanual  examination reveals no masses or nodularity.  Rectal confirms.  Exam is  somewhat limited by habitus.   ASSESSMENT:  A 60-year with a stage IC low-malignant potential tumor of  the ovary who clinically has no evidence of recurrent disease.   PLAN:  We will follow up on the results of her CA-125 from today.  She  will return to me in 6 months.      Paola A.  Duard Brady, MD  Electronically Signed     PAG/MEDQ  D:  08/03/2007  T:  08/04/2007  Job:  [id number]   cc:   Edwena Felty. Romine, M.D.  Fax: [phone]   Telford Nab, R.N.  [address]  [place], [place] [address]   Fleet Contras, M.D.  Fax: [phone]

## 2011-02-17 NOTE — Consult Note (Signed)
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   HISTORY OF PRESENT ILLNESS:  This is a 60 year old who underwent  exploratory laparotomy, extensive lysis of adhesions and staging on  December 03, 2006 for a low malignant potential tumor.  Findings were  notable for adhesive disease of the small bowel, anterior abdominal and  a 4 x 5 cm multicystic firm nodular mass with minimal ascites.  Final  pathology is consistent with serous cystadenofibroma with LMP component.  At this visit, Kathy Reese is without any complaints.   PAST MEDICAL HISTORY:  No changes.   PAST SURGICAL HISTORY:  No changes.   SCREENING HISTORY:  Mammogram will be collected this month.  Kathy Reese  informs that her colonoscopy has been performed.   SOCIAL HISTORY:  Her husband was treated for brain aneurysm with  resultant short-term memory loss.  Otherwise, she states her life is  unremarkable.   REVIEW OF SYSTEMS:  A 10-point review of systems was unremarkable.   PHYSICAL EXAMINATION:  GENERAL:  Well-developed female in no acute  distress.  VITAL SIGNS:  Weight 253 pounds.  ABDOMEN:  Soft, obese, nontender.  No palpable masses.  No evidence of  fluid wave.  PELVIC:  Normal external genitalia.  BARTHOLIN, URETHRA, SKENE:  No nodularity noted within the cul-de-sac.  RECTAL:  Good sphincter tone without any masses.   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  Ms.  Reese remains asymptomatic and is without any evidence of disease.   FOLLOW UP:  1. I have asked her to  follow up with Dr. Tresa Res in October 2010 for      her annual evaluation.  2. I have asked that she follow up in our office in April 2011.      Laurette Schimke, MD  Electronically Signed     WB/MEDQ  D:  01/09/2009  T:  01/09/2009  Job:  [id number]   cc:   Telford Nab, R.N.  [address]  [place], [place] [address]   Edwena Felty. Romine, M.D.  Fax: [phone]   Fleet Contras, M.D.  Fax: [phone]

## 2011-02-20 NOTE — Consult Note (Signed)
NAME:  Kathy Reese, Kathy Reese                 ACCOUNT NO.:  [id number]   MEDICAL RECORD NO.:  [id number]          PATIENT TYPE:  OUT   LOCATION:  GYN                          FACILITY:  [facility]   PHYSICIAN:  Paola A. Duard Brady, MD    DATE OF BIRTH:  05-23-1951   DATE OF CONSULTATION:  01/04/2007  DATE OF DISCHARGE:                                 CONSULTATION   Ms. Bizzarro is a very pleasant 60 year old with a history of having  undergone a hysterectomy for fibroids and ovarian cyst removal several  years later.  She has been followed with ultrasounds and CA-125 for a  complex ovarian cyst and peritoneal inclusion cyst.  Most recently, she  had an ultrasound in Dr. Harlene Salts office that revealed a 3.2 x 3.9 x 2.4  cm cystic and solid mass with a 4 mm excrescence with septations and  thickened walls.  Her CA-125 was normal.  After discussion, we opted to  proceed with surgery secondary to the excrescences, though they were  smaller.  She wished to proceed with laparoscopic surgery.  We did get  medical clearance from her.  She subsequently underwent diagnostic  laparoscopy, extensive lysis of adhesions, exploratory laparotomy on  December 03, 2006.  Operative findings included extensive adhesions of  the small bowel to the anterior abdominal wall and to the colon to the  pelvic floor obscuring the view to the pelvis.  There was a 4 x 5 cm  multicystic firm, nodular left ovary with minimal ascites.  Postoperatively, she did quite well.  Final pathology washings were  consistent with serous tumor.  The pelvic mass revealed a serous  cystadenofibroma with low malignant potential, a Brenner tumorlet of 3  mm.  There was no evidence of malignancy.  There was a pelvic nodule  that was positive.  Final pathology was consistent with a stage 1C low  malignant potential tumor of the ovary.  She was dispositioned to close  follow-up.  She comes in today for her postoperative check.   She is overall doing quite  well and denies any significant complaints.  She feels that she has recovered fairly well from surgery.  She denies  any vasomotor symptoms or any abdominal pain.  She has normal return of  bowel and bladder function.   PHYSICAL EXAMINATION:  GENERAL APPEARANCE:  Weight 226 pounds.  Well-  nourished, well-developed female in no acute distress.  ABDOMEN:  Shows a well-healing vertical skin incision.  Abdomen is soft  and nontender.   ASSESSMENT:  A 60 year old status post exploratory laparotomy, left  salpingo-oophorectomy, extensive lysis of adhesions for a stage 1C low  malignant potential tumor of the ovary.  We spent an extensive amount of  time discussing her pathology and the need for follow-up on an every 6  month basis with pelvic examination, CA-125, if there is ever a concern  based on exam or patient's symptomatology that there may be a recurrence  of her low malignant probability tumor, we would proceed with imaging.  She understands the need for every 6 month follow-up and will return  to  see [place] in 6 months.   SUMMARY:  1. We had discussion regarding hormone replacement therapy.  At this      point, the patient is not having any      vasomotor symptoms and she does not wish to proceed with any.  I      think that would be reasonable.  She knows that this could be an      ongoing conversation should her symptoms change.  2. Ultimately, we may be able to alternate visits with Dr. Harlene Salts      office, but will see how she does initially.      Paola A. Duard Brady, MD  Electronically Signed     PAG/MEDQ  D:  01/04/2007  T:  01/04/2007  Job:  [id number]   cc:   Edwena Felty. Romine, M.D.  Fax: [phone]   Telford Nab, R.N.  [address]  [place], [place] [address]   Fleet Contras, M.D.  Fax: [phone]

## 2011-02-20 NOTE — Procedures (Signed)
NAME:  Kathy Reese, Kathy Reese                 ACCOUNT NO.:  [id number]   MEDICAL RECORD NO.:  [id number]          PATIENT TYPE:  OUT   LOCATION:  SLEEP CENTER                 FACILITY:  [facility]   PHYSICIAN:  Clinton D. Maple Hudson, M.D. DATE OF BIRTH:  September 24, 1951   DATE OF STUDY:  02/12/2005                              NOCTURNAL POLYSOMNOGRAM   REFERRING PHYSICIAN:  Dr. Jetty Duhamel   DATE OF STUDY:  Feb 12, 2005   INDICATION FOR STUDY:  Hypersomnia with sleep apnea.  Epworth Sleepiness  Score 18/24, BMI 42, weight 232 pounds.  A diagnostic NPSG on January 11, 2005  recorded RDI of 51.9 per hour.  CPAP titration is requested.   SLEEP ARCHITECTURE:  Total sleep time 385 minutes with sleep efficiency 82%.  Stage I was 7%, stage II 88%, stages III and IV 2%, REM was 3% of total  sleep time.  Sleep latency 15.5 minutes, REM latency 131 minutes, awake  after sleep onset 68 minutes, arousal index 26.  She had been provided  Sonata but technician did not know if she took it, commenting that the  patient nodded off to sleep while being placed on monitors at 8:45 p.m.  Patient had complained of headache on the study night.   RESPIRATORY DATA:  CPAP titration protocol.  CPAP was titrated up to 15 CWP  with recommended initial pressure setting 9 CWP, RDI 1.1 per hour.  A petite  Profile Gel Mask was used with heated humidifier.   OXYGEN DATA:  Mean oxygen saturation through the study was 96% on room air  while on CPAP.   CARDIAC DATA:  Normal sinus rhythm with some PACs.   MOVEMENT/PARASOMNIA:  Occasional leg jerks with arousal, 1.4 per hour.   IMPRESSION/RECOMMENDATION:  Successful continuous positive airway pressure  titration suggesting initial trial pressure of 9 CWP, respiratory  disturbance index 1.1 per hour.  She may also do well at 13 CWP, respiratory  disturbance index 3.7 per hour associated with a longer trial time and a  lower percentage of time awake while on that pressure.  A Profile Gel  Mask,  size petite, was used with heated humidifier.      CDY/MEDQ  D:  02/15/2005 14:44:20  T:  02/15/2005 20:07:44  Job:  [id number]

## 2011-02-20 NOTE — Letter (Signed)
December 09, 2009     RE:  [name], [name]  MRN:  [id number]  /  DOB:  1951-07-27   To Whom It May Concern:   This is a letter of appeal for denial of your coverage for Nuvigil  therapy.  I believe this woman meets the standard criteria for use of  Nuvigil to treat residual significant daytime hypersomnolence.  She is  compliant with CPAP therapy for documented obstructive sleep apnea.  She  failed to tolerate Ritalin or Adderall.  Ambien is used to consolidate  nighttime sleep, but does not contribute to daytime sleepiness, which  has been a significant and persistent complaint for her.  Nuvigil has  been well tolerated and very effective.  We have addressed sleep hygiene  and excluded other potentially complicating medical conditions.  Thank  you for reconsidering Nuvigil coverage at 150 mg daily, #90 tablets for  a 8-month cycle, refilled x3.    Sincerely,      Clinton D. Maple Hudson, MD, Tonny Bollman, FACP  Electronically Signed    CDY/MedQ  DD: 12/09/2009  DT: 12/10/2009  Job #: [id number]

## 2011-02-20 NOTE — Consult Note (Signed)
NAME:  Kathy Reese, Kathy Reese                 ACCOUNT NO.:  [id number]   MEDICAL RECORD NO.:  [id number]          PATIENT TYPE:  OUT   LOCATION:  GYN                          [facility]ACILITY:  [facility]   PHYSICIAN:  Paola A. Duard Brady, MD    DATE OF BIRTH:  10-Jun-1951   DATE OF CONSULTATION:  09/22/2006  DATE OF DISCHARGE:                                 CONSULTATION   HISTORY:  [name] is a 60 year old who was initially seen by me in  October 2006.  She had a history of undergoing hysterectomy  approximately 20 years prior for fibroids and an ovarian cyst removal  several years later which per her report was benign, since March 2004  she had been followed with ultrasounds and a CA-125 for complex ovarian  cyst and peritoneal inclusion cyst.  Most recently, she had an  ultrasound performed by Dr. Harlene Salts office that revealed a 3.2 x 3.9 x  2.4 cm cystic and solid mass with a 4 mm excrescence.  There has been  septations and thickened walls.  In addition there was a 4.1 x 1.7 x 7.1  cm collection of echo free fluid with a single septation transversing  the fluid.  This appeared to be all arising from the left adnexa.  CA-  125 was normal.  This was a significant change from the 1.5 x 1.4 x 2.2  cm cyst that she had in the past.  The excrescence is smaller (in the  past it was 6 x 5 mm, today it is 4 x 4 mm).  After discussion the  patient wished to proceed with surgery.  Since we last saw her, her  husband had a ruptured brain aneurysm and has had significant issues  with short-term memory so she has had significant stressors in her life  but she is willing to proceed with surgery.  She would very much like to  proceed with laparoscopic surgery.  I discussed this at length with her,  that we would attempt it laparoscopically, the biggest limitation of  proceeding with laparoscopic surgery would be her adhesive disease.   PHYSICAL EXAMINATION:  VITAL SIGNS:  Weight 234 pounds, height 5 feet 2  inches.  GENERAL:  Well-nourished, well-developed female in no acute distress.  ABDOMEN:  Well-healed vertical skin incision.  Her abdomen is morbidly  obese, soft, nontender, there is no palpable mass or hepatosplenomegaly  though exam is limited by habitus.  Groins are negative for adenopathy.  EXTREMITIES:  There is no edema.  BIMANUAL EXAMINATION:  There is no masses palpable.   ASSESSMENT:  A 60 year old morbidly obese female status post  hysterectomy/right salpingo-oophorectomy who has a complex left adnexal  mass that over the past year has enlarged.   PLAN:  She wish to proceed with laparoscopic surgery.  I have a letter  of medical clearance from her primary physician, Dr. Concepcion Elk who feels  that the patient is mild to moderate risk.  They recommend that she hold  her Avandamet for 48 hours after surgery, that we carefully monitor her  glucose and use sliding scale NovoLog,  that we encourage chest PT and  spirometry and DVT prophylaxis.  I discussed with her that if we are  able to do this laparoscopically she will be discharged the same day and  may resume her regular medications.  If this is a malignant process or  if she has adhesive disease too significant to proceed with laparoscopic  surgery, she does consent to a laparotomy in which case she will be in  the hospital for a longer period of time and she will have a longer  convalescence.  Risks and benefits of surgery were discussed with the  patient and she wished to proceed.  At this point we are looking at  performing the surgery on January 22, which is our next available OR  date here in [place].  We will contact of Dr. Harlene Salts office.      Paola A. Duard Brady, MD  Electronically Signed     PAG/MEDQ  D:  09/22/2006  T:  09/22/2006  Job:  [id number]   cc:   Edwena Felty. Romine, M.D.  Fax: [phone]   Telford Nab, R.N.  [address]  [place], [place] [address]   Fleet Contras, M.D.  Fax: [phone]

## 2011-02-20 NOTE — Procedures (Signed)
NAME:  Kathy Reese, CULMER                 ACCOUNT NO.:  [id number]   MEDICAL RECORD NO.:  [id number]          PATIENT TYPE:  OUT   LOCATION:  SLEEP CENTER                 FACILITY:  [facility]   PHYSICIAN:  Clinton D. Maple Hudson, M.D. DATE OF BIRTH:  25-Apr-1951   DATE OF STUDY:  01/12/2005                            MULTIPLE SLEEP LATENCY TEST   DATE OF STUDY:  January 12, 2005   REFERRING PHYSICIAN:  Dr. Jetty Duhamel   INDICATION FOR STUDY:  Narcolepsy with cataplexy, hypersomnia with sleep  apnea.  Epworth Sleepiness Score 20/24.  No medications taken.  NPSG on  January 11, 2005 recorded 372 minutes of sleep including 24% REM, RDI 51.9 per  hour.   Nap Times:              Sleep Latency:                REM Latency:  1)  0800                      13 minutes                    N/A  2)  1000                      12 minutes                    2.5 minutes  3)  1200                      6 minutes                     11 minutes  4)  1400                      20 minutes                    N/A  5)  1600                      20 minutes                    N/A   MEAN SLEEP LATENCY:  12.8 minutes   IMPRESSION/RECOMMENDATION:  Mean sleep latency of 12.8 minutes is not  clearly abnormal and may be normal, especially in the context of diagnosed  obstructive sleep apnea/hypopnea based on a nocturnal polysomnogram the  previous night.      CDY/MEDQ  D:  01/18/2005 15:35:19  T:  01/18/2005 20:15:43  Job:  [id number]

## 2011-02-20 NOTE — Procedures (Signed)
NAME:  Kathy Reese, Kathy Reese                 ACCOUNT NO.:  [id number]   MEDICAL RECORD NO.:  [id number]          PATIENT TYPE:  OUT   LOCATION:  SLEEP CENTER                 FACILITY:  [facility]   PHYSICIAN:  Clinton D. Maple Hudson, M.D. DATE OF BIRTH:  07-16-1951   DATE OF STUDY:  01/11/2005                              NOCTURNAL POLYSOMNOGRAM   STUDY DATE:  January 11, 2005   REFERRING PHYSICIAN:  Dr. Jetty Duhamel   INDICATION FOR STUDY:  Hypersomnia with sleep apnea.  Epworth Sleepiness  Score 20/24, BMI 34, weight 192 pounds.   SLEEP ARCHITECTURE:  Total sleep time 372 minutes with sleep efficiency 81%.  Stage I was 15%, stage II 61%, stages III and IV 1%, REM was 24% of total  sleep time.  Sleep latency 33 minutes, REM latency 215 minutes, awake after  sleep onset 58 minutes, arousal index 21.  No medications were taken at  bedtime.   RESPIRATORY DATA:  Respiratory disturbance index (RDI, AHI) 51.9 obstructive  events per hour indicating severe obstructive apnea/hypopnea syndrome.  This  included 11 obstructive apneas and 311 hypopneas.  Events were not  positional.  REM RDI was 66.  CPAP titration was not requested.   OXYGEN DATA:  Moderate snoring with oxygen desaturation to a nadir of 84%.  Mean oxygen saturation through the study was 95% on room air.   CARDIAC DATA:  Normal sinus rhythm.   MOVEMENT/PARASOMNIA:  Frequent leg jerks but very few had any impact on  sleep.   IMPRESSION/RECOMMENDATION:  1.  Severe obstructive sleep apnea/hypopnea syndrome, respiratory      disturbance index 51.9 per hour, mostly hypopneas.  Oxygen desaturation      to 84%.  2.  See multiple sleep latency test report.      CDY/MEDQ  D:  01/18/2005 15:26:33  T:  01/18/2005 19:27:41  Job:  [id number]

## 2011-02-20 NOTE — Consult Note (Signed)
NAME:  Kathy Reese, Kathy Reese                 ACCOUNT NO.:  [id number]   MEDICAL RECORD NO.:  [id number]          PATIENT TYPE:  OUT   LOCATION:  GYN                          FACILITY:  [facility]   PHYSICIAN:  Paola A. Duard Brady, MD    DATE OF BIRTH:  September 12, 1951   DATE OF CONSULTATION:  07/28/2005  DATE OF DISCHARGE:                                   CONSULTATION   HISTORY OF PRESENT ILLNESS:  The patient is seen today in consultation at  the request of Dr. Anibal Henderson.  Kathy Reese is a 60 year old gravida 4, para 3 who  underwent a hysterectomy about 10-15 years ago for fibroids and ovarian cyst  removal for which she describes as benign disease a few years later.  She  has been followed since March 2004 with ultrasounds of CA-125 for complex  ovarian cyst and peritoneal inclusion cyst.  She is followed on a fairly  religious basis.  Most recently, she had a transvaginal ultrasound May 27, 2005, which revealed a 3.3x3.2x3.4 cm ovarian mass.  There was a  continued presence of a 1.5x1.4x2.2 cm cyst that contained low level echo  with a 6x5 mm excressence that was not apparent in the past.  In addition,  she had the cul-de-sac fluid collection measuring 5.9x3.1x1.8 cm.  Prior to  that, she had an ultrasound September 15, 2005, that revealed a complex cyst  measuring 15x14x28 mm and increased peritoneal fluid collection measuring  4.9x2.0x8.0 cm.  CA-125 has always been normal.  Per Dr. Alver Fisher last note,  the plan was to proceed with routine follow up and surveillance.  However,  the patient wished to be seen for a second opinion.  As stated above, her  most recent CA-125 was normal August 23 at 8.5.  Prior to that, in May 2006,  it was 13.4.  She is, otherwise, asymptomatic.  She denies any pain.  She  denies any change in bowel or bladder habits, nausea, vomiting, fevers,  chills, early satiety.   PAST MEDICAL HISTORY:  1.  Hypertension.  2.  Diabetes.  3.  Hypercholesterolemia.  4.  Sleep apnea.   MEDICATIONS:  1.  Diovan.  2.  Metformin.  3.  Lipitor.  4.  Other medications, she does not recall the names of.  5.  Per Dr. Alver Fisher note include:      1.  Hydrochlorothiazide.      2.  Zoloft.  6.  She is also on CPAP at night.   ALLERGIES:  None.   PAST SURGICAL HISTORY:  1.  Hysterectomy as above.  2.  RSO.  3.  NSVD x3.   SOCIAL HISTORY:  She is married.  She works for [employer].  She denies use  of tobacco or alcohol.   FAMILY HISTORY:  Her brother has diabetes.  Her mother had lung cancer, but  she was a smoker.  Father had prostate cancer in his 15's.  She has a  brother with prostate cancer at age 16.  Her children are alive and well.   HEALTH MANAGEMENT:  Mammogram negative in 2006.  She has never had a  colonoscopy.   PHYSICAL EXAMINATION:  VITAL SIGNS:  Height 5 feet 2 inches, weight 228  pounds, blood pressure 128/68, pulse 72, respirations 18.  GENERAL APPEARANCE:  Well-nourished, well-developed female in no acute  distress.  NECK:  Supple.  No lymphadenopathy.  No thyromegaly.  LUNGS:  Clear to auscultation bilaterally.  CARDIOVASCULAR:  Regular rate and rhythm with distant heart sounds.  ABDOMEN:  Morbidly obese.  She has a well healed vertical skin incision and  well healed transverse skin incision.  No evidence of incision drain, though  exam is limited.  Soft, nontender, nondistended.  There are no palpable  masses or hepatosplenomegaly.  Exam is limited by habitus.  Groin is  negative for adenopathy.  PELVIC:  Bimanual examination reveals no masses or nodularity.  RECTAL:  Confirmed.  Exam is limited by obesity.   ASSESSMENT:  A 60 year old gravida 4, para 3 with adnexal mass that has been  followed and has some intermittent changes since 2004.  The patient very  much would like to avoid surgery if at all possible.  When I discussed this  with her, her fear is not menopausal loss of the ovary but the fear of  surgery and how it relates to her other  medical comorbidities.  I discussed  with her that I do not believe that this represents a malignant process as  it has been followed over time and has not changed.  However, I could not  completely exclude a malignant process with a persistent ovarian mass in a  woman who is perimenopausal and acidic fluids.  I think it would be  reasonable to proceed with a repeat ultrasound in approximately three weeks  which will be three months from her prior ultrasound.  The mass does appear  to be getting small, and based on the measurements given to me, the fluid  collection also appears to be smaller.  I think it is reasonable to follow  up, and she will call Dr. Woody Seller office to schedule an appointment for  follow up ultrasound and CA-125.  In the interim, I would recommend that she  contact her primary care physician, Dr. Shaune Pollack in [place], to see  what her perioperative risks would be in terms of risk stratification.  She  will forward her ultrasound note to [place] when it is available so we can review  it and discuss the results and determine disposition pending them.      Paola A. Duard Brady, MD  Electronically Signed     PAG/MEDQ  D:  07/28/2005  T:  07/29/2005  Job:  [id number]   cc:   Cheryle Horsfall, M.D.   Duncan Dull, M.D.  Fax: [phone]   Telford Nab, R.N.  [address]  [place], [place] [address]

## 2011-02-24 ENCOUNTER — Other Ambulatory Visit: Payer: Self-pay | Admitting: Internal Medicine

## 2011-02-24 DIAGNOSIS — Z1231 Encounter for screening mammogram for malignant neoplasm of breast: Secondary | ICD-10-CM

## 2011-02-27 ENCOUNTER — Ambulatory Visit
Admission: RE | Admit: 2011-02-27 | Discharge: 2011-02-27 | Disposition: A | Discharge status: Home / Self Care | Payer: 59 | Source: Ambulatory Visit | Attending: Internal Medicine | Admitting: Internal Medicine

## 2011-02-27 DIAGNOSIS — Z1231 Encounter for screening mammogram for malignant neoplasm of breast: Secondary | ICD-10-CM

## 2011-05-06 ENCOUNTER — Encounter: Payer: Self-pay | Admitting: Internal Medicine

## 2011-05-11 ENCOUNTER — Ambulatory Visit: Payer: Self-pay | Admitting: Internal Medicine

## 2011-06-02 ENCOUNTER — Ambulatory Visit: Payer: Self-pay | Admitting: Internal Medicine

## 2011-07-01 ENCOUNTER — Telehealth: Payer: Self-pay | Admitting: Internal Medicine

## 2011-07-01 NOTE — Telephone Encounter (Signed)
LMTCB

## 2011-07-01 NOTE — Telephone Encounter (Signed)
ATC NA unable to leave VM WCB 

## 2011-07-01 NOTE — Telephone Encounter (Signed)
ATC NA WCB unable to leave VM 

## 2011-07-02 NOTE — Telephone Encounter (Signed)
lmomtcb  

## 2011-07-03 NOTE — Telephone Encounter (Signed)
LMTCB

## 2011-07-07 NOTE — Telephone Encounter (Signed)
Before I send I want to be sure she knows she will be getting nuvigil and not provigil which is what she inquired about in the initial msg taken. LMTCB

## 2011-07-07 NOTE — Telephone Encounter (Signed)
Per CY-okay to give Rx.

## 2011-07-07 NOTE — Telephone Encounter (Signed)
Called, spoke with pt.  States she is using the cpap every night but still feeling really tired during the day.  She is requesting a refill for the "medication to help with this." Per med list, pt has nuvigil 150 mg 1 twice daily as needed.  She would like a 90 day rx for [facility].  Her last OV with CDY was on 11/10/10.  She does have a pending appt with him on Oct 11 and plans to keep this.  Dr. Maple Hudson, are you ok with a 90 day rx for this?  Pls advise.  Thanks!

## 2011-07-08 NOTE — Telephone Encounter (Signed)
LMTCB

## 2011-07-09 NOTE — Telephone Encounter (Signed)
ATC pt at home # but mailbox is full and unable to accept new messages at this time but I was given an option to leave a call back # which I did- left the office phone # for pt to call back.

## 2011-07-14 NOTE — Telephone Encounter (Signed)
ATC pt at home # but was not able to leave VM [facility]

## 2011-07-15 LAB — CA 125: CA 125: 6

## 2011-07-15 NOTE — Telephone Encounter (Signed)
Called [phone] and was able to leave message on pt's named VM -- lmomtcb to clarify below.

## 2011-07-16 ENCOUNTER — Encounter: Payer: Self-pay | Admitting: Internal Medicine

## 2011-07-16 ENCOUNTER — Ambulatory Visit (INDEPENDENT_AMBULATORY_CARE_PROVIDER_SITE_OTHER): Payer: 59 | Admitting: Internal Medicine

## 2011-07-16 VITALS — BP 128/78 | HR 70 | Ht 64.0 in | Wt 220.0 lb

## 2011-07-16 DIAGNOSIS — Z23 Encounter for immunization: Secondary | ICD-10-CM

## 2011-07-16 DIAGNOSIS — G4710 Hypersomnia, unspecified: Secondary | ICD-10-CM

## 2011-07-16 DIAGNOSIS — G4733 Obstructive sleep apnea (adult) (pediatric): Secondary | ICD-10-CM

## 2011-07-16 MED ORDER — ARMODAFINIL 150 MG PO TABS: ORAL_TABLET | ORAL | Status: DC

## 2011-07-16 NOTE — Telephone Encounter (Signed)
Pt is scheduled for an appt with CDY today at 3:15pm. I will forward msg to Florentina Addison so she is aware and make sure the pt is aware the rx is for nuvigil and not provigil. RX has not been sent to [facility].

## 2011-07-16 NOTE — Telephone Encounter (Signed)
This was addressed with patient by CY during OV today.

## 2011-07-16 NOTE — Progress Notes (Signed)
07/16/11- 60 year old female never smoker followed for obstructive sleep apnea, hypersomnia complicated by DM, HBP , allergic rhinitis. Last here -11/10/2010 Continues CPAP 14 CWP/Advanced, using at all night every night. She says she still fights daytime sleepiness. I don't get a history of sleep process or cataplexy. She works a Water engineer job. Temazepam does help consolidate sleep. She uses Nuvigil once or rarely twice in a day but only uses a couple of them every 3 months or so.  ROS See HPI Constitutional:   No-   weight loss, night sweats, fevers, chills, fatigue, lassitude. HEENT:   No-  headaches, difficulty swallowing, tooth/dental problems, sore throat,       No-  sneezing, itching, ear ache, nasal congestion, post nasal drip,  CV:  No-   chest pain, orthopnea, PND, swelling in lower extremities, anasarca, dizziness, palpitations Resp: No-   shortness of breath with exertion or at rest.              No-   productive cough,  No non-productive cough,  No-  coughing up of blood.              No-   change in color of mucus.  No- wheezing.   Skin: No-   rash or lesions. GI:  No-   heartburn, indigestion, abdominal pain, nausea, vomiting, diarrhea,                 change in bowel habits, loss of appetite GU: No-   dysuria, change in color of urine, no urgency or frequency.  No- flank pain. MS:  No-   joint pain or swelling.  No- decreased range of motion.  No- back pain. Neuro- grossly normal to observation, Or:  Psych:  No- change in mood or affect. No depression or anxiety.  No memory loss.  OBJ General- Alert, Oriented, Affect-appropriate, Distress- none acute; obese, pleasant and cooperative Skin- rash-none, lesions- none, excoriation- none Lymphadenopathy- none Head- atraumatic            Eyes- Gross vision intact, PERRLA, conjunctivae clear secretions            Ears- Hearing, canals-normal            Nose- Clear, no-Septal dev, mucus, polyps, erosion, perforation   Throat- Mallampati III , mucosa clear , drainage- none, tonsils- atrophic Neck- flexible , trachea midline, no stridor , thyroid nl, carotid no bruit Chest - symmetrical excursion , unlabored           Heart/CV- RRR , no murmur , no gallop  , no rub, nl s1 s2                           - JVD- none , edema- none, stasis changes- none, varices- none           Lung- clear to P&A, wheeze- none, cough- none , dullness-none, rub- none           Chest wall-  Abd- tender-no, distended-no, bowel sounds-present, HSM- no Br/ Gen/ Rectal- Not done, not indicated Extrem- cyanosis- none, clubbing, none, atrophy- none, strength- nl Neuro- grossly intact to observation

## 2011-07-16 NOTE — Patient Instructions (Addendum)
Continue CPAP- our goal is "all night every night"  Ok to use 1 or 2 Nuvigil tabs (150 mg) daily if needed       Please call as needed  Try to catch an occasional nap when you can  Flu vax

## 2011-07-18 NOTE — Assessment & Plan Note (Addendum)
I believe she is adequately compliant with CPAP and the pressure control is appropriate. There has been and insomnia component at rest with temazepam.

## 2011-07-18 NOTE — Assessment & Plan Note (Signed)
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 We have discussed the possibility of getting a multiple sleep latency test. She uses Nuvigil appropriately, so for now I think we can continue with that treatment.

## 2011-07-27 ENCOUNTER — Telehealth: Payer: Self-pay | Admitting: Internal Medicine

## 2011-07-27 NOTE — Telephone Encounter (Signed)
LMTCB

## 2011-07-27 NOTE — Telephone Encounter (Signed)
Dr Maple Hudson, I just want to verify that you wanted to give pt the rx for nuvigil 150 mg 1 to 2 daily before I call the pharmacy back. Please advise, thanks!

## 2011-07-27 NOTE — Telephone Encounter (Signed)
Per CY-tell her that her insurance limits her to 1 of the 150mg  tabs/daily so give her #30 with 5 refills.

## 2011-07-28 NOTE — Telephone Encounter (Signed)
Kathy [facility]ese with [facility] aware of the new rx given below for nuvigil 150mg  1 daily #90 with 1 refill. Left message on pt's cell phone with this info also and stated if she had further questions to please call back and leave a new message. We have tried to reach pt a couple of times and the pharmacy was going to close out the rx so we had to go ahead and give the new order advised by dr young

## 2011-07-28 NOTE — Telephone Encounter (Signed)
lmomtcb for pt 

## 2011-07-28 NOTE — Telephone Encounter (Signed)
Kathy Reese w/ [facility] called re: same msg as yesterday- ref# is the same as well as call back #- needs an answer before thurs of this wk. Kathy Reese

## 2012-01-14 ENCOUNTER — Ambulatory Visit: Payer: 59 | Admitting: Internal Medicine

## 2012-02-12 ENCOUNTER — Other Ambulatory Visit: Payer: Self-pay | Admitting: Internal Medicine

## 2012-02-12 DIAGNOSIS — Z1231 Encounter for screening mammogram for malignant neoplasm of breast: Secondary | ICD-10-CM

## 2012-02-15 ENCOUNTER — Ambulatory Visit (INDEPENDENT_AMBULATORY_CARE_PROVIDER_SITE_OTHER): Payer: 59 | Admitting: Internal Medicine

## 2012-02-15 ENCOUNTER — Encounter: Payer: Self-pay | Admitting: Internal Medicine

## 2012-02-15 VITALS — BP 124/76 | HR 69 | Ht 64.0 in | Wt 222.8 lb

## 2012-02-15 DIAGNOSIS — G4710 Hypersomnia, unspecified: Secondary | ICD-10-CM

## 2012-02-15 DIAGNOSIS — E669 Obesity, unspecified: Secondary | ICD-10-CM

## 2012-02-15 DIAGNOSIS — G4733 Obstructive sleep apnea (adult) (pediatric): Secondary | ICD-10-CM

## 2012-02-15 MED ORDER — TEMAZEPAM 15 MG PO CAPS: 15.0000 mg | ORAL_CAPSULE | Freq: Every evening | ORAL | Status: DC | PRN

## 2012-02-15 NOTE — Progress Notes (Signed)
07/16/11- 60 year old female never smoker followed for obstructive sleep apnea, hypersomnia complicated by DM, HBP , allergic rhinitis. Last here -11/10/2010 Continues CPAP 14 CWP/Advanced, using at all night every night. She says she still fights daytime sleepiness. I don't get a history of sleep process or cataplexy. She works a Water engineer job. Temazepam does help consolidate sleep. She uses Nuvigil once or rarely twice in a day but only uses a couple of them every 3 months or so.  02/15/12- 61 year old female never smoker followed for obstructive sleep apnea, hypersomnia complicated by DM, HBP , allergic rhinitis. Uses CPAP every night for approx 3-4 hours right now due to severe knee pain-may end up having knee surgery. Temazepam used to help her sleep. Work interferes with nap opportunities during the day. 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  ROS-See HPI Constitutional:   No-   weight loss, night sweats, fevers, chills, fatigue, lassitude. HEENT:   No-  headaches, difficulty swallowing, tooth/dental problems, sore throat,       No-  sneezing, itching, ear ache, nasal congestion, post nasal drip,  CV:  No-   chest pain, orthopnea, PND, swelling in lower extremities, anasarca, dizziness, palpitations Resp: No-   shortness of breath with exertion or at rest.              No-   productive cough,  No non-productive cough,  No-  coughing up of blood.              No-   change in color of mucus.  No- wheezing.   Skin: No-   rash or lesions. GI:  No-   heartburn, indigestion, abdominal pain, nausea, vomiting,  GU:  MS:  + joint pain or swelling.   Neuro- grossly normal to observation, Or:  Psych:  No- change in mood or affect. No depression or anxiety.  No memory loss.  OBJ General- Alert, Oriented, Affect-appropriate, Distress- none acute; obese, pleasant and cooperative Skin- rash-none, lesions- none, excoriation- none Lymphadenopathy- none Head- atraumatic        Eyes- Gross vision intact, PERRLA, conjunctivae clear secretions            Ears- Hearing, canals-normal            Nose- Clear, no-Septal dev, mucus, polyps, erosion, perforation             Throat- Mallampati III-IV , mucosa clear , drainage- none, tonsils- atrophic Neck- flexible , trachea midline, no stridor , thyroid nl, carotid no bruit Chest - symmetrical excursion , unlabored           Heart/CV- RRR , no murmur , no gallop  , no rub, nl s1 s2                           - JVD- none , edema- none, stasis changes- none, varices- none           Lung- clear to P&A, wheeze- none, cough- none , dullness-none, rub- none           Chest wall-  Abd-  Br/ Gen/ Rectal- Not done, not indicated Extrem- cyanosis- none, clubbing, none, atrophy- none, strength- nl Neuro- grossly intact to observation

## 2012-02-15 NOTE — Patient Instructions (Signed)
Refill script for temazepam  Keep using CPAP all night, every night while you are sleeping.  Pease call as needed

## 2012-02-20 NOTE — Assessment & Plan Note (Signed)
Her weight loss efforts have not been successful in her further limited by arthritis.

## 2012-02-20 NOTE — Assessment & Plan Note (Signed)
Arthritis pain is keeping her awake. This needs to be addressed by her orthopedist. CPAP provides good control when she wears it.

## 2012-02-20 NOTE — Assessment & Plan Note (Signed)
Limited use of CPAP makes her more dependent on Nuvigil so that she can stay employed during the daytime. We have discussed sleep hygiene and use of medication for alertness. Emphasized responsibility to drive safely.

## 2012-03-01 ENCOUNTER — Ambulatory Visit
Admission: RE | Admit: 2012-03-01 | Discharge: 2012-03-01 | Disposition: A | Discharge status: Home / Self Care | Payer: 59 | Source: Ambulatory Visit | Attending: Internal Medicine | Admitting: Internal Medicine

## 2012-03-01 DIAGNOSIS — Z1231 Encounter for screening mammogram for malignant neoplasm of breast: Secondary | ICD-10-CM

## 2012-06-24 ENCOUNTER — Other Ambulatory Visit: Payer: Self-pay | Admitting: Orthopedic Surgery

## 2012-06-30 ENCOUNTER — Encounter (HOSPITAL_BASED_OUTPATIENT_CLINIC_OR_DEPARTMENT_OTHER): Payer: Self-pay | Admitting: *Deleted

## 2012-06-30 NOTE — Progress Notes (Signed)
Pt to bring all meds  Day of surgery and will also bring cpap/

## 2012-06-30 NOTE — Progress Notes (Signed)
Will come Monday 9 30 2013 for labs and ekg .

## 2012-07-04 ENCOUNTER — Encounter (HOSPITAL_BASED_OUTPATIENT_CLINIC_OR_DEPARTMENT_OTHER)
Admission: RE | Admit: 2012-07-04 | Discharge: 2012-07-04 | Disposition: A | Discharge status: Home / Self Care | Payer: 59 | Source: Ambulatory Visit | Attending: Orthopedic Surgery | Admitting: Orthopedic Surgery

## 2012-07-04 LAB — BASIC METABOLIC PANEL
BUN: 17 mg/dL (ref 6–23)
CO2: 28 mEq/L (ref 19–32)
Calcium: 9.3 mg/dL (ref 8.4–10.5)
Chloride: 103 mEq/L (ref 96–112)
Creatinine, Ser: 0.56 mg/dL (ref 0.50–1.10)
GFR calc Af Amer: >90 mL/min (ref >90)
GFR calc non Af Amer: >90 mL/min (ref >90)
Glucose, Bld: 169 mg/dL — ABNORMAL HIGH (ref 70–99)
Potassium: 4 mEq/L (ref 3.5–5.1)
Sodium: 137 mEq/L (ref 135–145)

## 2012-07-06 ENCOUNTER — Ambulatory Visit (HOSPITAL_BASED_OUTPATIENT_CLINIC_OR_DEPARTMENT_OTHER)
Admission: RE | Admit: 2012-07-06 | Discharge: 2012-07-06 | Disposition: A | Discharge status: Home / Self Care | Payer: 59 | Source: Ambulatory Visit | Attending: Orthopedic Surgery | Admitting: Orthopedic Surgery

## 2012-07-06 ENCOUNTER — Ambulatory Visit (HOSPITAL_BASED_OUTPATIENT_CLINIC_OR_DEPARTMENT_OTHER): Payer: 59 | Admitting: Anesthesiology

## 2012-07-06 ENCOUNTER — Encounter (HOSPITAL_BASED_OUTPATIENT_CLINIC_OR_DEPARTMENT_OTHER)
Admission: RE | Disposition: A | Discharge status: Home / Self Care | Payer: Self-pay | Source: Ambulatory Visit | Attending: Orthopedic Surgery

## 2012-07-06 ENCOUNTER — Encounter (HOSPITAL_BASED_OUTPATIENT_CLINIC_OR_DEPARTMENT_OTHER): Payer: Self-pay

## 2012-07-06 ENCOUNTER — Encounter (HOSPITAL_BASED_OUTPATIENT_CLINIC_OR_DEPARTMENT_OTHER): Payer: Self-pay | Admitting: Anesthesiology

## 2012-07-06 DIAGNOSIS — Z794 Long term (current) use of insulin: Secondary | ICD-10-CM | POA: Insufficient documentation

## 2012-07-06 DIAGNOSIS — Z01810 Encounter for preprocedural cardiovascular examination: Secondary | ICD-10-CM | POA: Insufficient documentation

## 2012-07-06 DIAGNOSIS — E119 Type 2 diabetes mellitus without complications: Secondary | ICD-10-CM | POA: Insufficient documentation

## 2012-07-06 DIAGNOSIS — I10 Essential (primary) hypertension: Secondary | ICD-10-CM | POA: Insufficient documentation

## 2012-07-06 DIAGNOSIS — M6530 Trigger finger, unspecified finger: Secondary | ICD-10-CM | POA: Insufficient documentation

## 2012-07-06 DIAGNOSIS — Z01812 Encounter for preprocedural laboratory examination: Secondary | ICD-10-CM | POA: Insufficient documentation

## 2012-07-06 HISTORY — PX: TRIGGER FINGER RELEASE: SHX641

## 2012-07-06 LAB — GLUCOSE, CAPILLARY
Glucose-Capillary: 90 mg/dL (ref 70–99)
Glucose-Capillary: 110 mg/dL — ABNORMAL HIGH (ref 70–99)

## 2012-07-06 LAB — POCT HEMOGLOBIN-HEMACUE: Hemoglobin: 11.8 g/dL — ABNORMAL LOW (ref 12.0–15.0)

## 2012-07-06 SURGERY — RELEASE, A1 PULLEY, FOR TRIGGER FINGER
Anesthesia: General | Site: Finger | Laterality: Left | Wound class: Clean

## 2012-07-06 MED ORDER — HYDROCODONE-ACETAMINOPHEN 5-500 MG PO TABS: 1.0000 | ORAL_TABLET | Freq: Four times a day (QID) | ORAL | Status: DC | PRN

## 2012-07-06 MED ORDER — PROPOFOL 10 MG/ML IV BOLUS
INTRAVENOUS | Status: DC | PRN
  Administered 2012-07-06: 150 mg via INTRAVENOUS
  Administered 2012-07-06: 100 mg via INTRAVENOUS

## 2012-07-06 MED ORDER — POVIDONE-IODINE 7.5 % EX SOLN: Freq: Once | CUTANEOUS | Status: DC

## 2012-07-06 MED ORDER — MIDAZOLAM HCL 2 MG/2ML IJ SOLN: 0.5000 mg | Freq: Once | INTRAMUSCULAR | Status: DC | PRN

## 2012-07-06 MED ORDER — BUPIVACAINE HCL (PF) 0.5 % IJ SOLN
INTRAMUSCULAR | Status: DC | PRN
  Administered 2012-07-06: 5 mL via INTRA_ARTICULAR

## 2012-07-06 MED ORDER — FENTANYL CITRATE 0.05 MG/ML IJ SOLN
INTRAMUSCULAR | Status: DC | PRN
  Administered 2012-07-06: 50 ug via INTRAVENOUS

## 2012-07-06 MED ORDER — FENTANYL CITRATE 0.05 MG/ML IJ SOLN: 25.0000 ug | INTRAMUSCULAR | Status: DC | PRN

## 2012-07-06 MED ORDER — DEXAMETHASONE SODIUM PHOSPHATE 10 MG/ML IJ SOLN
INTRAMUSCULAR | Status: DC | PRN
  Administered 2012-07-06: 5 mg via INTRAVENOUS

## 2012-07-06 MED ORDER — ONDANSETRON HCL 4 MG/2ML IJ SOLN
INTRAMUSCULAR | Status: DC | PRN
  Administered 2012-07-06: 4 mg via INTRAVENOUS

## 2012-07-06 MED ORDER — LACTATED RINGERS IV SOLN
INTRAVENOUS | Status: DC
  Administered 2012-07-06: 12:00:00 via INTRAVENOUS

## 2012-07-06 MED ORDER — CEFAZOLIN SODIUM-DEXTROSE 2-3 GM-% IV SOLR
2.0000 g | INTRAVENOUS | Status: AC
  Administered 2012-07-06: 2 g via INTRAVENOUS

## 2012-07-06 MED ORDER — MIDAZOLAM HCL 5 MG/5ML IJ SOLN
INTRAMUSCULAR | Status: DC | PRN
  Administered 2012-07-06: 2 mg via INTRAVENOUS

## 2012-07-06 MED ORDER — PROMETHAZINE HCL 25 MG/ML IJ SOLN: 6.2500 mg | INTRAMUSCULAR | Status: DC | PRN

## 2012-07-06 MED ORDER — MEPERIDINE HCL 25 MG/ML IJ SOLN: 6.2500 mg | INTRAMUSCULAR | Status: DC | PRN

## 2012-07-06 SURGICAL SUPPLY — 45 items
BLADE MINI RND TIP GREEN BEAV (BLADE) IMPLANT
BLADE SURG 15 STRL LF DISP TIS (BLADE) ×1 IMPLANT
BLADE SURG 15 STRL SS (BLADE) ×2
BNDG CMPR 9X4 STRL LF SNTH (GAUZE/BANDAGES/DRESSINGS)
BNDG CMPR MD 5X2 ELC HKLP STRL (GAUZE/BANDAGES/DRESSINGS) ×1
BNDG ELASTIC 2 VLCR STRL LF (GAUZE/BANDAGES/DRESSINGS) ×2 IMPLANT
BNDG ESMARK 4X9 LF (GAUZE/BANDAGES/DRESSINGS) IMPLANT
CLOTH BEACON ORANGE TIMEOUT ST (SAFETY) ×2 IMPLANT
COVER MAYO STAND STRL (DRAPES) ×2 IMPLANT
COVER TABLE BACK 60X90 (DRAPES) ×2 IMPLANT
CUFF TOURNIQUET SINGLE 18IN (TOURNIQUET CUFF) ×1 IMPLANT
DECANTER SPIKE VIAL GLASS SM (MISCELLANEOUS) IMPLANT
DRAPE EXTREMITY T 121X128X90 (DRAPE) ×2 IMPLANT
DRSG EMULSION OIL 3X3 NADH (GAUZE/BANDAGES/DRESSINGS) ×1 IMPLANT
DURAPREP 26ML APPLICATOR (WOUND CARE) ×2 IMPLANT
ELECT NDL TIP 2.8 STRL (NEEDLE) IMPLANT
ELECT NEEDLE TIP 2.8 STRL (NEEDLE) IMPLANT
ELECT REM PT RETURN 9FT ADLT (ELECTROSURGICAL)
ELECTRODE REM PT RTRN 9FT ADLT (ELECTROSURGICAL) IMPLANT
GLOVE BIO SURGEON STRL SZ 6.5 (GLOVE) ×1 IMPLANT
GLOVE BIOGEL PI IND STRL 8 (GLOVE) ×2 IMPLANT
GLOVE BIOGEL PI INDICATOR 8 (GLOVE) ×2
GLOVE ECLIPSE 7.5 STRL STRAW (GLOVE) ×4 IMPLANT
GOWN BRE IMP PREV XXLGXLNG (GOWN DISPOSABLE) ×3 IMPLANT
GOWN PREVENTION PLUS XLARGE (GOWN DISPOSABLE) ×2 IMPLANT
GOWN PREVENTION PLUS XXLARGE (GOWN DISPOSABLE) ×1 IMPLANT
NDL HYPO 25X1 1.5 SAFETY (NEEDLE) IMPLANT
NEEDLE HYPO 25X1 1.5 SAFETY (NEEDLE) ×2 IMPLANT
NS IRRIG 1000ML POUR BTL (IV SOLUTION) ×2 IMPLANT
PACK BASIN DAY SURGERY FS (CUSTOM PROCEDURE TRAY) ×2 IMPLANT
PADDING CAST ABS 4INX4YD NS (CAST SUPPLIES) ×1
PADDING CAST ABS COTTON 4X4 ST (CAST SUPPLIES) ×1 IMPLANT
PADDING UNDERCAST 2  STERILE (CAST SUPPLIES) ×2 IMPLANT
PENCIL BUTTON HOLSTER BLD 10FT (ELECTRODE) IMPLANT
SPLINT PLASTER CAST XFAST 3X15 (CAST SUPPLIES) IMPLANT
SPLINT PLASTER XTRA FASTSET 3X (CAST SUPPLIES)
SPONGE GAUZE 4X4 12PLY (GAUZE/BANDAGES/DRESSINGS) ×2 IMPLANT
STOCKINETTE 4X48 STRL (DRAPES) ×2 IMPLANT
SUT ETHILON 4 0 P 3 18 (SUTURE) ×2 IMPLANT
SYR BULB 3OZ (MISCELLANEOUS) ×1 IMPLANT
SYR CONTROL 10ML LL (SYRINGE) ×1 IMPLANT
TOWEL OR 17X24 6PK STRL BLUE (TOWEL DISPOSABLE) ×4 IMPLANT
TOWEL OR NON WOVEN STRL DISP B (DISPOSABLE) ×2 IMPLANT
UNDERPAD 30X30 INCONTINENT (UNDERPADS AND DIAPERS) ×2 IMPLANT
WATER STERILE IRR 1000ML POUR (IV SOLUTION) ×1 IMPLANT

## 2012-07-06 NOTE — Anesthesia Preprocedure Evaluation (Signed)
Anesthesia Evaluation  Patient identified by MRN, date of birth, ID band Patient awake    Reviewed: Allergy & Precautions, H&P , NPO status , Patient's Chart, lab work & pertinent test results, reviewed documented beta blocker date and time   History of Anesthesia Complications Negative for: history of anesthetic complications  Airway Mallampati: II TM Distance: >3 FB Neck ROM: Full    Dental  (+) Teeth Intact and Dental Advisory Given   Pulmonary sleep apnea and Continuous Positive Airway Pressure Ventilation ,  breath sounds clear to auscultation  Pulmonary exam normal       Cardiovascular hypertension, Pt. on medications and Pt. on home beta blockers Rhythm:Regular Rate:Normal     Neuro/Psych negative neurological ROS     GI/Hepatic negative GI ROS, Neg liver ROS,   Endo/Other  diabetes, Well Controlled, Type 2, Insulin Dependent and Oral Hypoglycemic AgentsMorbid obesity  Renal/GU negative Renal ROS     Musculoskeletal   Abdominal (+) + obese,   Peds  Hematology negative hematology ROS (+)   Anesthesia Other Findings   Reproductive/Obstetrics                           Anesthesia Physical Anesthesia Plan  ASA: III  Anesthesia Plan: General   Post-op Pain Management:    Induction: Intravenous  Airway Management Planned: LMA  Additional Equipment:   Intra-op Plan:   Post-operative Plan:   Informed Consent: I have reviewed the patients History and Physical, chart, labs and discussed the procedure including the risks, benefits and alternatives for the proposed anesthesia with the patient or authorized representative who has indicated his/her understanding and acceptance.   Dental advisory given  Plan Discussed with: CRNA and Surgeon  Anesthesia Plan Comments: (Plan routine monitors, GA- LMA OK)        Anesthesia Quick Evaluation

## 2012-07-06 NOTE — Anesthesia Procedure Notes (Addendum)
Performed by: Signa Kell C   Procedure Name: LMA Insertion Date/Time: 07/06/2012 12:07 PM Performed by: Burna Cash Pre-anesthesia Checklist: Patient identified, Emergency Drugs available, Suction available and Patient being monitored Patient Re-evaluated:Patient Re-evaluated prior to inductionOxygen Delivery Method: Circle System Utilized Preoxygenation: Pre-oxygenation with 100% oxygen Intubation Type: IV induction Ventilation: Mask ventilation without difficulty LMA: LMA with gastric port inserted LMA Size: 4.0 Number of attempts: 2 Placement Confirmation: positive ETCO2 Tube secured with: Tape Dental Injury: Teeth and Oropharynx as per pre-operative assessment

## 2012-07-06 NOTE — H&P (Signed)
PREOPERATIVE H&P  Chief Complaint: l. Ring trigger finger  HPI: Kathy Reese is a 61 y.o. female who presents for evaluation of l. Ring trigger. It has been present for greater than 3 months and has been worsening. She has failed conservative measures. Pain is rated as moderate.  Past Medical History  Diagnosis Date  . Allergic rhinitis, cause unspecified   . Type II or unspecified type diabetes mellitus without mention of complication, not stated as uncontrolled   . Other and unspecified hyperlipidemia   . Unspecified essential hypertension   . OSA on CPAP   . Hypersomnia    Past Surgical History  Procedure Date  . Tonsillectomy   . Total abdominal hysterectomy w/ bilateral salpingoophorectomy    History   Social History  . Marital Status: Married    Spouse Name: N/A    Number of Children: N/A  . Years of Education: N/A   Occupational History  . [employer]    Social History Main Topics  . Smoking status: Never Smoker   . Smokeless tobacco: None  . Alcohol Use: None  . Drug Use: None  . Sexually Active: None   Other Topics Concern  . None   Social History Narrative  . None   Family History  Problem Relation Age of Onset  . Lung cancer Mother   . Colon cancer Father   . Stroke Brother    No Known Allergies Prior to Admission medications   Medication Sig Start Date End Date Taking? Authorizing Provider  Armodafinil (NUVIGIL) 150 MG tablet 1-2 tablets at bedtime as needed 07/16/11 07/15/12 Yes Clinton D Young, MD  atorvastatin (LIPITOR) 40 MG tablet Take 40 mg by mouth daily.     Yes Historical Provider, MD  glipiZIDE (GLUCOTROL) 5 MG tablet Take 5 mg by mouth 2 (two) times daily before a meal.   Yes Historical Provider, MD  insulin detemir (LEVEMIR) 100 UNIT/ML injection Inject 20 Units into the skin at bedtime.     Yes Historical Provider, MD  metFORMIN (GLUCOPHAGE) 500 MG tablet Take 500 mg by mouth 2 (two) times daily with a meal.   Yes Historical  Provider, MD  metoprolol (TOPROL-XL) 50 MG 24 hr tablet Take 50 mg by mouth daily.     Yes Historical Provider, MD  temazepam (RESTORIL) 15 MG capsule Take 1 capsule (15 mg total) by mouth at bedtime as needed for sleep. 02/15/12 02/14/13 Yes Clinton D Young, MD  valsartan-hydrochlorothiazide (DIOVAN-HCT) 320-25 MG per tablet Take 1 tablet by mouth daily.     Yes Historical Provider, MD     Positive ROS: none  All other systems have been reviewed and were otherwise negative with the exception of those mentioned in the HPI and as above.  Physical Exam: There were no vitals filed for this visit.  General: Alert, no acute distress Cardiovascular: No pedal edema Respiratory: No cyanosis, no use of accessory musculature GI: No organomegaly, abdomen is soft and non-tender Skin: No lesions in the area of chief complaint Neurologic: Sensation intact distally Psychiatric: Patient is competent for consent with normal mood and affect Lymphatic: No axillary or cervical lymphadenopathy  MUSCULOSKELETAL: l ring: locked trigger finger  Assessment/Plan: left ring finger trigger Plan for Procedure(s): RELEASE TRIGGER FINGER/A-1 PULLEY  The risks benefits and alternatives were discussed with the patient including but not limited to the risks of nonoperative treatment, versus surgical intervention including infection, bleeding, nerve injury, malunion, nonunion, hardware prominence, hardware failure, need for hardware removal,  blood clots, cardiopulmonary complications, morbidity, mortality, among others, and they were willing to proceed.  Predicted outcome is good, although there will be at least a six to nine month expected recovery.  Jesica Goheen L, MD 07/06/2012 11:16 AM

## 2012-07-06 NOTE — Progress Notes (Signed)
All documentation since admission done by Makynlee Kressin,RN. 

## 2012-07-06 NOTE — Brief Op Note (Signed)
07/06/2012  12:21 PM  PATIENT:  Abbeygail D Contino  61 y.o. female  PRE-OPERATIVE DIAGNOSIS:  left ring finger trigger  POST-OPERATIVE DIAGNOSIS:  left ring finger trigger  PROCEDURE:  Procedure(s) (LRB) with comments: RELEASE TRIGGER FINGER/A-1 PULLEY (Left) - left ring finger  SURGEON:  Surgeon(s) and Role:    * Harvie Junior, MD - Primary  PHYSICIAN ASSISTANT:   ASSISTANTS: bethune   ANESTHESIA:   general  EBL:  Total I/O In: 300 [I.V.:300] Out: -   BLOOD ADMINISTERED:none  DRAINS: none   LOCAL MEDICATIONS USED:  MARCAINE     SPECIMEN:  No Specimen  DISPOSITION OF SPECIMEN:  N/A  COUNTS:  YES  TOURNIQUET:  * Missing tourniquet times found for documented tourniquets in log:  [address] *  DICTATION: .Other Dictation: Dictation Number [id number]  PLAN OF CARE: Discharge to home after PACU  PATIENT DISPOSITION:  PACU - hemodynamically stable.   Delay start of Pharmacological VTE agent (>24hrs) due to surgical blood loss or risk of bleeding: not applicable

## 2012-07-06 NOTE — Anesthesia Postprocedure Evaluation (Signed)
  Anesthesia Post-op Note  Patient: Kathy Reese  Procedure(s) Performed: Procedure(s) (LRB) with comments: RELEASE TRIGGER FINGER/A-1 PULLEY (Left) - left ring finger  Patient Location: PACU  Anesthesia Type: General  Level of Consciousness: awake, alert  and oriented  Airway and Oxygen Therapy: Patient Spontanous Breathing  Post-op Pain: none  Post-op Assessment: Post-op Vital signs reviewed, Patient's Cardiovascular Status Stable, Respiratory Function Stable, Patent Airway, No signs of Nausea or vomiting and Pain level controlled  Post-op Vital Signs: Reviewed and stable  Complications: No apparent anesthesia complications

## 2012-07-06 NOTE — Transfer of Care (Signed)
Immediate Anesthesia Transfer of Care Note  Patient: Kathy Reese  Procedure(s) Performed: Procedure(s) (LRB) with comments: RELEASE TRIGGER FINGER/A-1 PULLEY (Left) - left ring finger  Patient Location: PACU  Anesthesia Type: General  Level of Consciousness: awake, alert  and oriented  Airway & Oxygen Therapy: Patient Spontanous Breathing and Patient connected to face mask oxygen  Post-op Assessment: Report given to PACU RN and Post -op Vital signs reviewed and stable  Post vital signs: Reviewed and stable  Complications: No apparent anesthesia complications

## 2012-07-07 ENCOUNTER — Encounter (HOSPITAL_BASED_OUTPATIENT_CLINIC_OR_DEPARTMENT_OTHER): Payer: Self-pay | Admitting: Orthopedic Surgery

## 2012-07-07 NOTE — Op Note (Signed)
NAME:  TRACYE, SZUCH                 ACCOUNT NO.:  [id number]  MEDICAL RECORD NO.:  [id number]  LOCATION:                                 FACILITY:  PHYSICIAN:  Harvie Junior, M.D.   DATE OF BIRTH:  [birth year]  DATE OF PROCEDURE:  07/06/2012 DATE OF DISCHARGE:                              OPERATIVE REPORT   PREOPERATIVE DIAGNOSIS:  Left long trigger finger.  POSTOPERATIVE DIAGNOSIS:  Left long trigger finger.  PROCEDURE:  Left long trigger finger release.  SURGEON:  Harvie Junior, MD  ASSISTANT:  Marshia Ly, PA.  ANESTHESIA:  General.  BRIEF HISTORY:  Ms. Quaranta is a 61 year old female with a history of having left long trigger finger.  She had been locking with this; and in fact, she came in today locked.  Then, we had a long talk about treatment options and felt that release was the only appropriate course of action given she had failed conservative care.  She was taken to the operating room for this procedure.  PROCEDURE IN DETAIL:  The patient taken to the operating room.  After adequate general anesthesia obtained with general anesthetic, the patient was placed on the operating table.  The left hand was then prepped and draped in usual sterile fashion.  Following this, a small incision was made over the A1 pulley, subcutaneous tissue down to the level of the A1 pulley.  Retractors were then placed to protect the neurovascular bundle.  A1 pulley was clearly identified and released. The tendons were then pulled into the wound, and there was no triggering.  At this point, the wound was irrigated, suctioned dry, closed with 4-0 nylon interrupted suture.  Sterile compressive dressing was applied.  The patient was taken to the recovery.  She was noted to be in satisfactory condition.  Estimated blood loss for the procedure was none.     Harvie Junior, M.D.     Ranae Plumber  D:  07/06/2012  T:  07/07/2012  Job:  [id number]

## 2012-08-16 ENCOUNTER — Ambulatory Visit: Payer: 59 | Admitting: Internal Medicine

## 2012-09-16 ENCOUNTER — Telehealth: Payer: Self-pay | Admitting: Internal Medicine

## 2012-09-16 NOTE — Telephone Encounter (Signed)
Per CY---pt will need to explain a lot better what meds, and why it is a problem for her.  lmomtcb

## 2012-09-16 NOTE — Telephone Encounter (Signed)
Pt returned call.  Kathy Reese ° °

## 2012-09-16 NOTE — Telephone Encounter (Signed)
Called and spoke with pt and she stated that she works for [employer] and they are wanting to change her hours of work.  She stated that she currently works from 9:30 am to 6:30 pm.   She stated that they wanted to change her hours to 10;30 am to 7:30 pm.   She stated that she would like CY to write her a letter stating that she will need to keep her current hours due to the meds that she is taking, etc.  CY please advise if you can do this letter.  Thanks  Last ov--02/15/2012 No pending ov   No Known Allergies

## 2012-09-16 NOTE — Telephone Encounter (Signed)
LMOM TCB x1 - as this is a named voice mail, left detailed message stating that CY needs additional information on why her work schedule effects her medications, etc.

## 2012-09-19 ENCOUNTER — Encounter: Payer: Self-pay | Admitting: Internal Medicine

## 2012-09-19 NOTE — Telephone Encounter (Signed)
Pt states that she needs the letter so she can go back to 9:30am to 6"30pm at work. She takes Nuvigil once in the morning and only takes the second dose if needed in the afternoon. She does not drive much anymore due to her sleepiness and must take the bus to and from work. The last bus runs at 6:30pm according to the pt. She is afraid of having an accident and does not want to drive that far to work daily. Pls advise on letter.

## 2012-09-19 NOTE — Telephone Encounter (Signed)
Spoke with patient-aware that letter has been done by CY-faxed to [phone] at patients request and sent letter to patient by mail.

## 2012-09-19 NOTE — Telephone Encounter (Signed)
Letter done

## 2012-12-29 ENCOUNTER — Encounter: Payer: Self-pay | Admitting: Internal Medicine

## 2013-01-25 ENCOUNTER — Encounter: Payer: Self-pay | Admitting: Internal Medicine

## 2013-02-03 ENCOUNTER — Other Ambulatory Visit: Payer: Self-pay

## 2013-02-03 DIAGNOSIS — Z1231 Encounter for screening mammogram for malignant neoplasm of breast: Secondary | ICD-10-CM

## 2013-03-08 ENCOUNTER — Ambulatory Visit (AMBULATORY_SURGERY_CENTER): Payer: BC Managed Care – PPO

## 2013-03-08 ENCOUNTER — Encounter: Payer: Self-pay | Admitting: Internal Medicine

## 2013-03-08 VITALS — Ht 63.0 in | Wt 230.0 lb

## 2013-03-08 DIAGNOSIS — Z1211 Encounter for screening for malignant neoplasm of colon: Secondary | ICD-10-CM

## 2013-03-08 DIAGNOSIS — Z86010 Personal history of colonic polyps: Secondary | ICD-10-CM

## 2013-03-08 DIAGNOSIS — Z800 Family history of malignant neoplasm of digestive organs: Secondary | ICD-10-CM

## 2013-03-08 MED ORDER — MOVIPREP 100 G PO SOLR: ORAL | Status: DC

## 2013-03-13 ENCOUNTER — Ambulatory Visit
Admission: RE | Admit: 2013-03-13 | Discharge: 2013-03-13 | Disposition: A | Discharge status: Home / Self Care | Payer: BC Managed Care – PPO | Source: Ambulatory Visit

## 2013-03-13 DIAGNOSIS — Z1231 Encounter for screening mammogram for malignant neoplasm of breast: Secondary | ICD-10-CM

## 2013-03-22 ENCOUNTER — Encounter: Payer: Self-pay | Admitting: Internal Medicine

## 2013-03-22 ENCOUNTER — Ambulatory Visit (AMBULATORY_SURGERY_CENTER): Payer: BC Managed Care – PPO | Admitting: Internal Medicine

## 2013-03-22 VITALS — BP 154/87 | HR 59 | Temp 97.4°F | Resp 16 | Ht 63.0 in | Wt 230.0 lb

## 2013-03-22 DIAGNOSIS — Z1211 Encounter for screening for malignant neoplasm of colon: Secondary | ICD-10-CM

## 2013-03-22 DIAGNOSIS — Z860100 Personal history of colon polyps, unspecified: Secondary | ICD-10-CM

## 2013-03-22 LAB — GLUCOSE, CAPILLARY
Glucose-Capillary: 153 mg/dL — ABNORMAL HIGH (ref 70–99)
Glucose-Capillary: 150 mg/dL — ABNORMAL HIGH (ref 70–99)

## 2013-03-22 MED ORDER — SODIUM CHLORIDE 0.9 % IV SOLN: 500.0000 mL | INTRAVENOUS | Status: DC

## 2013-03-22 NOTE — Op Note (Signed)
[facility] [address] [place], [address]   COLONOSCOPY PROCEDURE REPORT  PATIENT: Kathy, Reese  MR#: [id number] BIRTHDATE: 09-Aug-1951 , 61  yrs. old GENDER: Female ENDOSCOPIST: Hart Carwin, MD REFERRED BY:  Fleet Contras, M.D. PROCEDURE DATE:  03/22/2013 PROCEDURE:   Colonoscopy, screening ASA CLASS:   Class II INDICATIONS:2001 colonoscopy- adenomatous polyp, 2009 colon- no polyp. MEDICATIONS: MAC sedation, administered by CRNA and propofol (Diprivan) 250mg  IV  DESCRIPTION OF PROCEDURE:   After the risks and benefits and of the procedure were explained, informed consent was obtained.  A digital rectal exam revealed no abnormalities of the rectum.    The LB [id number]  endoscope was introduced through the anus and advanced to the cecum, which was identified by both the appendix and ileocecal valve .  The quality of the prep was good, using MoviPrep .  The instrument was then slowly withdrawn as the colon was fully examined.     COLON FINDINGS: There was mild diverticulosis noted in the sigmoid colon with associated muscular hypertrophy.     Retroflexed views revealed no abnormalities.     The scope was then withdrawn from the patient and the procedure completed.  COMPLICATIONS: There were no complications. ENDOSCOPIC IMPRESSION: There was mild diverticulosis noted in the sigmoid colon  RECOMMENDATIONS: high fiber diet   REPEAT EXAM: In 10 year(s)  for Colonoscopy.  cc:  _______________________________ eSignedHart Carwin, MD 03/22/2013 8:35 AM

## 2013-03-22 NOTE — Progress Notes (Signed)
Pt stable to RR 

## 2013-03-22 NOTE — Progress Notes (Signed)
Patient did not experience any of the following events: a burn prior to discharge; a fall within the facility; wrong site/side/patient/procedure/implant event; or a hospital transfer or hospital admission upon discharge from the facility. (G8907) Patient did not have preoperative order for IV antibiotic SSI prophylaxis. (G8918)  

## 2013-03-22 NOTE — Patient Instructions (Signed)
YOU HAD AN ENDOSCOPIC PROCEDURE TODAY AT THE  ENDOSCOPY CENTER: Refer to the procedure report that was given to you for any specific questions about what was found during the examination.  If the procedure report does not answer your questions, please call your gastroenterologist to clarify.  If you requested that your care partner not be given the details of your procedure findings, then the procedure report has been included in a sealed envelope for you to review at your convenience later.  YOU SHOULD EXPECT: Some feelings of bloating in the abdomen. Passage of more gas than usual.  Walking can help get rid of the air that was put into your GI tract during the procedure and reduce the bloating. If you had a lower endoscopy (such as a colonoscopy or flexible sigmoidoscopy) you may notice spotting of blood in your stool or on the toilet paper. If you underwent a bowel prep for your procedure, then you may not have a normal bowel movement for a few days.  DIET: Your first meal following the procedure should be a light meal and then it is ok to progress to your normal diet.  A half-sandwich or bowl of soup is an example of a good first meal.  Heavy or fried foods are harder to digest and may make you feel nauseous or bloated.  Likewise meals heavy in dairy and vegetables can cause extra gas to form and this can also increase the bloating.  Drink plenty of fluids but you should avoid alcoholic beverages for 24 hours.  ACTIVITY: Your care partner should take you home directly after the procedure.  You should plan to take it easy, moving slowly for the rest of the day.  You can resume normal activity the day after the procedure however you should NOT DRIVE or use heavy machinery for 24 hours (because of the sedation medicines used during the test).    SYMPTOMS TO REPORT IMMEDIATELY: A gastroenterologist can be reached at any hour.  During normal business hours, 8:30 AM to 5:00 PM Monday through Friday,  call [phone].  After hours and on weekends, please call the GI answering service at [phone] who will take a message and have the physician on call contact you.   Following lower endoscopy (colonoscopy or flexible sigmoidoscopy):  Excessive amounts of blood in the stool  Significant tenderness or worsening of abdominal pains  Swelling of the abdomen that is new, acute  Fever of 100F or higher    FOLLOW UP: If any biopsies were taken you will be contacted by phone or by letter within the next 1-3 weeks.  Call your gastroenterologist if you have not heard about the biopsies in 3 weeks.  Our staff will call the home number listed on your records the next business day following your procedure to check on you and address any questions or concerns that you may have at that time regarding the information given to you following your procedure. This is a courtesy call and so if there is no answer at the home number and we have not heard from you through the emergency physician on call, we will assume that you have returned to your regular daily activities without incident.  SIGNATURES/CONFIDENTIALITY: You and/or your care partner have signed paperwork which will be entered into your electronic medical record.  These signatures attest to the fact that that the information above on your After Visit Summary has been reviewed and is understood.  Full responsibility of the confidentiality   of this discharge information lies with you and/or your care-partner.     

## 2013-03-23 ENCOUNTER — Telehealth: Payer: Self-pay

## 2013-03-23 NOTE — Telephone Encounter (Signed)
  Follow up Call-  Call back number 03/22/2013  Post procedure Call Back phone  # [phone]  Permission to leave phone message Yes     Patient questions:  Do you have a fever, pain , or abdominal swelling? no Pain Score  0 *  Have you tolerated food without any problems? yes  Have you been able to return to your normal activities? yes  Do you have any questions about your discharge instructions: Diet   no Medications  no Follow up visit  no  Do you have questions or concerns about your Care? no  Actions: * If pain score is 4 or above: No action needed, pain <4.

## 2013-04-28 ENCOUNTER — Encounter (HOSPITAL_COMMUNITY): Payer: Self-pay | Admitting: Pharmacy Technician

## 2013-05-01 ENCOUNTER — Other Ambulatory Visit: Payer: Self-pay | Admitting: Orthopedic Surgery

## 2013-05-04 NOTE — Pre-Procedure Instructions (Addendum)
Tayonna D Shepherd  05/04/2013  Your procedure is scheduled on:  Friday, August 8th   Report to [facility] at  5:30 AM.             Bonita Quin will come through Entrance "A", register at desk)  Call this number if you have problems the morning of surgery: [phone]   Remember:   Do not eat food or drink liquids after midnight Thursday.   Take these medicines the morning of surgery with A SIP OF WATER: Metoprolol, Diovan   Do not wear jewelry, make-up or nail polish.  Do not wear lotions, powders, or perfumes. You may NOT wear deodorant.  Do not shave underarms & legs 48 hours prior to surgery.  Do not bring valuables to the hospital.  [facility] is not responsible for any belongings or valuables.   Contacts, dentures or bridgework may not be worn into surgery.  Leave suitcase in the car. After surgery it may be brought to your room.   For patients admitted to the hospital, checkout time is 11:00 AM the day of discharge.   Name and phone number of your driver:    Special Instructions: Shower using CHG 2 nights before surgery and the night before surgery.  If you shower the day of surgery use CHG.  Use special wash - you have one bottle of CHG for all showers.  You should use approximately 1/3 of the bottle for each shower.   Please read over the following fact sheets that you were given: Pain Booklet, Coughing and Deep Breathing, Blood Transfusion Information, MRSA Information and Surgical Site Infection Prevention

## 2013-05-05 ENCOUNTER — Encounter (HOSPITAL_COMMUNITY): Payer: Self-pay

## 2013-05-05 ENCOUNTER — Encounter (HOSPITAL_COMMUNITY)
Admission: RE | Admit: 2013-05-05 | Discharge: 2013-05-05 | Disposition: A | Discharge status: Home / Self Care | Payer: BC Managed Care – PPO | Source: Ambulatory Visit | Attending: Orthopedic Surgery | Admitting: Orthopedic Surgery

## 2013-05-05 DIAGNOSIS — Z01812 Encounter for preprocedural laboratory examination: Secondary | ICD-10-CM | POA: Insufficient documentation

## 2013-05-05 DIAGNOSIS — Z01818 Encounter for other preprocedural examination: Secondary | ICD-10-CM | POA: Insufficient documentation

## 2013-05-05 HISTORY — DX: Diverticulosis of intestine, part unspecified, without perforation or abscess without bleeding: K57.90

## 2013-05-05 HISTORY — DX: Other specified postprocedural states: Z98.890

## 2013-05-05 HISTORY — DX: Nausea with vomiting, unspecified: R11.2

## 2013-05-05 HISTORY — DX: Unspecified osteoarthritis, unspecified site: M19.90

## 2013-05-05 LAB — COMPREHENSIVE METABOLIC PANEL
ALT: 24 U/L (ref 0–35)
AST: 17 U/L (ref 0–37)
Albumin: 3.6 g/dL (ref 3.5–5.2)
Alkaline Phosphatase: 147 U/L — ABNORMAL HIGH (ref 39–117)
BUN: 10 mg/dL (ref 6–23)
CO2: 30 mEq/L (ref 19–32)
Calcium: 9.3 mg/dL (ref 8.4–10.5)
Chloride: 104 mEq/L (ref 96–112)
Creatinine, Ser: 0.51 mg/dL (ref 0.50–1.10)
GFR calc Af Amer: >90 mL/min (ref >90)
GFR calc non Af Amer: >90 mL/min (ref >90)
Glucose, Bld: 148 mg/dL — ABNORMAL HIGH (ref 70–99)
Potassium: 3.5 mEq/L (ref 3.5–5.1)
Sodium: 141 mEq/L (ref 135–145)
Total Bilirubin: 1.2 mg/dL (ref 0.3–1.2)
Total Protein: 6.6 g/dL (ref 6.0–8.3)

## 2013-05-05 LAB — DIFFERENTIAL
Basophils Absolute: 0 10*3/uL (ref 0.0–0.1)
Basophils Relative: 0 % (ref 0–1)
Eosinophils Absolute: 0.1 10*3/uL (ref 0.0–0.7)
Eosinophils Relative: 2 % (ref 0–5)
Lymphocytes Relative: 35 % (ref 12–46)
Lymphs Abs: 2.5 10*3/uL (ref 0.7–4.0)
Monocytes Absolute: 0.5 10*3/uL (ref 0.1–1.0)
Monocytes Relative: 8 % (ref 3–12)
Neutro Abs: 3.9 10*3/uL (ref 1.7–7.7)
Neutrophils Relative %: 55 % (ref 43–77)

## 2013-05-05 LAB — SURGICAL PCR SCREEN
MRSA, PCR: NEGATIVE
Staphylococcus aureus: NEGATIVE

## 2013-05-05 LAB — URINALYSIS, ROUTINE W REFLEX MICROSCOPIC
Bilirubin Urine: NEGATIVE
Glucose, UA: NEGATIVE mg/dL
Hgb urine dipstick: NEGATIVE
Ketones, ur: NEGATIVE mg/dL
Leukocytes, UA: NEGATIVE
Nitrite: NEGATIVE
Protein, ur: NEGATIVE mg/dL
Specific Gravity, Urine: 1.019 (ref 1.005–1.030)
Urobilinogen, UA: 0.2 mg/dL (ref 0.0–1.0)
pH: 5.5 (ref 5.0–8.0)

## 2013-05-05 LAB — CBC
HCT: 38.6 % (ref 36.0–46.0)
Hemoglobin: 13.4 g/dL (ref 12.0–15.0)
MCH: 30.2 pg (ref 26.0–34.0)
MCHC: 34.7 g/dL (ref 30.0–36.0)
MCV: 86.9 fL (ref 78.0–100.0)
Platelets: 192 10*3/uL (ref 150–400)
RBC: 4.44 MIL/uL (ref 3.87–5.11)
RDW: 13.3 % (ref 11.5–15.5)
WBC: 6.9 10*3/uL (ref 4.0–10.5)

## 2013-05-05 LAB — TYPE AND SCREEN
ABO/RH(D): O POS
Antibody Screen: NEGATIVE

## 2013-05-05 LAB — ABO/RH: ABO/RH(D): O POS

## 2013-05-05 LAB — APTT: aPTT: 32 seconds (ref 24–37)

## 2013-05-05 LAB — PROTIME-INR
INR: 1.01 (ref 0.00–1.49)
Prothrombin Time: 13.1 seconds (ref 11.6–15.2)

## 2013-05-05 NOTE — Progress Notes (Addendum)
Pt uses Cpap, but does not know settings  (sleep study results inside chart) She states she had CXR & EKG done at PCP's...will call for that...DA 1220....message left for Aldean Baker for orders.  DA

## 2013-05-11 MED ORDER — CEFAZOLIN SODIUM-DEXTROSE 2-3 GM-% IV SOLR
2.0000 g | INTRAVENOUS | Status: AC
  Administered 2013-05-12: 2 g via INTRAVENOUS
  Filled 2013-05-11 (×2): qty 50

## 2013-05-12 ENCOUNTER — Inpatient Hospital Stay (HOSPITAL_COMMUNITY)
Admission: RE | Admit: 2013-05-12 | Discharge: 2013-05-15 | DRG: 209 | Disposition: A | Discharge status: Home Health | Payer: BC Managed Care – PPO | Source: Ambulatory Visit | Attending: Orthopedic Surgery | Admitting: Orthopedic Surgery

## 2013-05-12 ENCOUNTER — Ambulatory Visit (HOSPITAL_COMMUNITY): Payer: BC Managed Care – PPO | Admitting: Certified Registered"

## 2013-05-12 ENCOUNTER — Encounter (HOSPITAL_COMMUNITY): Payer: Self-pay | Admitting: *Deleted

## 2013-05-12 ENCOUNTER — Ambulatory Visit (HOSPITAL_COMMUNITY): Payer: BC Managed Care – PPO

## 2013-05-12 ENCOUNTER — Encounter (HOSPITAL_COMMUNITY): Payer: Self-pay | Admitting: Certified Registered"

## 2013-05-12 ENCOUNTER — Encounter (HOSPITAL_COMMUNITY)
Admission: RE | Disposition: A | Discharge status: Home Health | Payer: Self-pay | Source: Ambulatory Visit | Attending: Orthopedic Surgery

## 2013-05-12 DIAGNOSIS — M1711 Unilateral primary osteoarthritis, right knee: Secondary | ICD-10-CM

## 2013-05-12 DIAGNOSIS — Z800 Family history of malignant neoplasm of digestive organs: Secondary | ICD-10-CM

## 2013-05-12 DIAGNOSIS — E119 Type 2 diabetes mellitus without complications: Secondary | ICD-10-CM | POA: Diagnosis present

## 2013-05-12 DIAGNOSIS — Z7982 Long term (current) use of aspirin: Secondary | ICD-10-CM

## 2013-05-12 DIAGNOSIS — Z823 Family history of stroke: Secondary | ICD-10-CM

## 2013-05-12 DIAGNOSIS — Z79899 Other long term (current) drug therapy: Secondary | ICD-10-CM

## 2013-05-12 DIAGNOSIS — G4733 Obstructive sleep apnea (adult) (pediatric): Secondary | ICD-10-CM | POA: Diagnosis present

## 2013-05-12 DIAGNOSIS — Z801 Family history of malignant neoplasm of trachea, bronchus and lung: Secondary | ICD-10-CM

## 2013-05-12 DIAGNOSIS — Z794 Long term (current) use of insulin: Secondary | ICD-10-CM

## 2013-05-12 DIAGNOSIS — E785 Hyperlipidemia, unspecified: Secondary | ICD-10-CM | POA: Diagnosis present

## 2013-05-12 DIAGNOSIS — Z6841 Body Mass Index (BMI) 40.0 and over, adult: Secondary | ICD-10-CM

## 2013-05-12 DIAGNOSIS — M1710 Unilateral primary osteoarthritis, unspecified knee: Principal | ICD-10-CM | POA: Diagnosis present

## 2013-05-12 DIAGNOSIS — I10 Essential (primary) hypertension: Secondary | ICD-10-CM | POA: Diagnosis present

## 2013-05-12 DIAGNOSIS — E669 Obesity, unspecified: Secondary | ICD-10-CM | POA: Diagnosis present

## 2013-05-12 DIAGNOSIS — K5730 Diverticulosis of large intestine without perforation or abscess without bleeding: Secondary | ICD-10-CM | POA: Diagnosis present

## 2013-05-12 DIAGNOSIS — E7800 Pure hypercholesterolemia, unspecified: Secondary | ICD-10-CM | POA: Diagnosis present

## 2013-05-12 HISTORY — DX: Family history of other specified conditions: Z84.89

## 2013-05-12 HISTORY — PX: TOTAL KNEE ARTHROPLASTY: SHX125

## 2013-05-12 LAB — GLUCOSE, CAPILLARY
Glucose-Capillary: 123 mg/dL — ABNORMAL HIGH (ref 70–99)
Glucose-Capillary: 185 mg/dL — ABNORMAL HIGH (ref 70–99)
Glucose-Capillary: 152 mg/dL — ABNORMAL HIGH (ref 70–99)
Glucose-Capillary: 171 mg/dL — ABNORMAL HIGH (ref 70–99)

## 2013-05-12 SURGERY — ARTHROPLASTY, KNEE, TOTAL
Anesthesia: General | Site: Knee | Laterality: Right | Wound class: Clean

## 2013-05-12 MED ORDER — PROPOFOL 10 MG/ML IV BOLUS
INTRAVENOUS | Status: DC | PRN
  Administered 2013-05-12: 110 mg via INTRAVENOUS

## 2013-05-12 MED ORDER — BUPIVACAINE HCL (PF) 0.25 % IJ SOLN
INTRAMUSCULAR | Status: AC
  Filled 2013-05-12: qty 10

## 2013-05-12 MED ORDER — BUPIVACAINE-EPINEPHRINE PF 0.25-1:200000 % IJ SOLN
INTRAMUSCULAR | Status: AC
  Filled 2013-05-12: qty 30

## 2013-05-12 MED ORDER — LIDOCAINE HCL (CARDIAC) 20 MG/ML IV SOLN
INTRAVENOUS | Status: DC | PRN
  Administered 2013-05-12: 80 mg via INTRAVENOUS

## 2013-05-12 MED ORDER — SODIUM CHLORIDE 0.9 % IV SOLN
INTRAVENOUS | Status: DC
  Administered 2013-05-12: 75 mL/h via INTRAVENOUS
  Administered 2013-05-13: 03:00:00 via INTRAVENOUS

## 2013-05-12 MED ORDER — HYDROMORPHONE HCL PF 1 MG/ML IJ SOLN
INTRAMUSCULAR | Status: AC
  Filled 2013-05-12: qty 1

## 2013-05-12 MED ORDER — SCOPOLAMINE 1 MG/3DAYS TD PT72
MEDICATED_PATCH | TRANSDERMAL | Status: AC
  Administered 2013-05-12: 1 via TRANSDERMAL
  Filled 2013-05-12: qty 1

## 2013-05-12 MED ORDER — ACETAMINOPHEN 650 MG RE SUPP: 650.0000 mg | Freq: Four times a day (QID) | RECTAL | Status: DC | PRN

## 2013-05-12 MED ORDER — DOCUSATE SODIUM 100 MG PO CAPS
100.0000 mg | ORAL_CAPSULE | Freq: Two times a day (BID) | ORAL | Status: DC
Start: 2013-05-12 — End: 2013-05-15
  Administered 2013-05-12 – 2013-05-15 (×6): 100 mg via ORAL
  Filled 2013-05-12 (×7): qty 1

## 2013-05-12 MED ORDER — CEFUROXIME SODIUM 1.5 G IJ SOLR: INTRAMUSCULAR | Status: DC | PRN

## 2013-05-12 MED ORDER — KETOROLAC TROMETHAMINE 30 MG/ML IJ SOLN
INTRAMUSCULAR | Status: DC | PRN
  Administered 2013-05-12: 30 mg via INTRAVENOUS

## 2013-05-12 MED ORDER — DIPHENHYDRAMINE HCL 12.5 MG/5ML PO ELIX
12.5000 mg | ORAL_SOLUTION | ORAL | Status: DC | PRN
  Administered 2013-05-13: 12.5 mg via ORAL
  Administered 2013-05-14 – 2013-05-15 (×2): 25 mg via ORAL
  Filled 2013-05-12 (×3): qty 10

## 2013-05-12 MED ORDER — LACTATED RINGERS IV SOLN
INTRAVENOUS | Status: DC | PRN
  Administered 2013-05-12 (×2): via INTRAVENOUS

## 2013-05-12 MED ORDER — ALUM & MAG HYDROXIDE-SIMETH 200-200-20 MG/5ML PO SUSP: 30.0000 mL | ORAL | Status: DC | PRN

## 2013-05-12 MED ORDER — ONDANSETRON HCL 4 MG PO TABS: 4.0000 mg | ORAL_TABLET | Freq: Four times a day (QID) | ORAL | Status: DC | PRN

## 2013-05-12 MED ORDER — ACETAMINOPHEN 325 MG PO TABS: 650.0000 mg | ORAL_TABLET | Freq: Four times a day (QID) | ORAL | Status: DC | PRN

## 2013-05-12 MED ORDER — METHOCARBAMOL 500 MG PO TABS
500.0000 mg | ORAL_TABLET | Freq: Four times a day (QID) | ORAL | Status: DC | PRN
  Administered 2013-05-13 – 2013-05-15 (×4): 500 mg via ORAL
  Filled 2013-05-12 (×4): qty 1

## 2013-05-12 MED ORDER — GLYCOPYRROLATE 0.2 MG/ML IJ SOLN
INTRAMUSCULAR | Status: DC | PRN
  Administered 2013-05-12: .5 mg via INTRAVENOUS

## 2013-05-12 MED ORDER — INSULIN DETEMIR 100 UNIT/ML ~~LOC~~ SOLN
20.0000 [IU] | Freq: Every day | SUBCUTANEOUS | Status: DC
  Administered 2013-05-12 – 2013-05-14 (×3): 20 [IU] via SUBCUTANEOUS
  Filled 2013-05-12 (×4): qty 0.2

## 2013-05-12 MED ORDER — DEXTROSE 5 % IV SOLN
500.0000 mg | Freq: Four times a day (QID) | INTRAVENOUS | Status: DC | PRN
  Filled 2013-05-12: qty 5

## 2013-05-12 MED ORDER — METOPROLOL SUCCINATE ER 50 MG PO TB24
50.0000 mg | ORAL_TABLET | Freq: Every day | ORAL | Status: DC
Start: 2013-05-13 — End: 2013-05-15
  Administered 2013-05-13 – 2013-05-15 (×3): 50 mg via ORAL
  Filled 2013-05-12 (×3): qty 1

## 2013-05-12 MED ORDER — TRANEXAMIC ACID 100 MG/ML IV SOLN
1000.0000 mg | INTRAVENOUS | Status: AC
  Administered 2013-05-12: 1000 mg via INTRAVENOUS
  Filled 2013-05-12: qty 10

## 2013-05-12 MED ORDER — INSULIN DETEMIR 100 UNIT/ML ~~LOC~~ SOLN
25.0000 [IU] | Freq: Every day | SUBCUTANEOUS | Status: DC
  Administered 2013-05-12 – 2013-05-15 (×4): 25 [IU] via SUBCUTANEOUS
  Filled 2013-05-12 (×4): qty 0.25

## 2013-05-12 MED ORDER — ONDANSETRON HCL 4 MG/2ML IJ SOLN: 4.0000 mg | Freq: Four times a day (QID) | INTRAMUSCULAR | Status: DC | PRN

## 2013-05-12 MED ORDER — DEXAMETHASONE SODIUM PHOSPHATE 10 MG/ML IJ SOLN: 10.0000 mg | Freq: Once | INTRAMUSCULAR | Status: DC

## 2013-05-12 MED ORDER — CEFAZOLIN SODIUM-DEXTROSE 2-3 GM-% IV SOLR
2.0000 g | Freq: Four times a day (QID) | INTRAVENOUS | Status: AC
Start: 2013-05-12 — End: 2013-05-12
  Administered 2013-05-12 (×2): 2 g via INTRAVENOUS
  Filled 2013-05-12 (×2): qty 50

## 2013-05-12 MED ORDER — IRBESARTAN 150 MG PO TABS
150.0000 mg | ORAL_TABLET | Freq: Every day | ORAL | Status: DC
  Administered 2013-05-13 – 2013-05-15 (×3): 150 mg via ORAL
  Filled 2013-05-12 (×3): qty 1

## 2013-05-12 MED ORDER — POLYETHYLENE GLYCOL 3350 17 G PO PACK
17.0000 g | PACK | Freq: Every day | ORAL | Status: DC | PRN
  Administered 2013-05-15: 17 g via ORAL
  Filled 2013-05-12: qty 1

## 2013-05-12 MED ORDER — OFLOXACIN 0.3 % OP SOLN
1.0000 [drp] | Freq: Two times a day (BID) | OPHTHALMIC | Status: DC
  Administered 2013-05-13 – 2013-05-14 (×2): 1 [drp] via OPHTHALMIC
  Filled 2013-05-12: qty 5

## 2013-05-12 MED ORDER — CEFUROXIME SODIUM 1.5 G IJ SOLR
INTRAMUSCULAR | Status: AC
  Filled 2013-05-12: qty 1.5

## 2013-05-12 MED ORDER — FENTANYL CITRATE 0.05 MG/ML IJ SOLN
INTRAMUSCULAR | Status: DC | PRN
  Administered 2013-05-12 (×2): 50 ug via INTRAVENOUS
  Administered 2013-05-12: 25 ug via INTRAVENOUS
  Administered 2013-05-12: 125 ug via INTRAVENOUS
  Administered 2013-05-12: 25 ug via INTRAVENOUS
  Administered 2013-05-12: 50 ug via INTRAVENOUS
  Administered 2013-05-12 (×3): 25 ug via INTRAVENOUS
  Administered 2013-05-12 (×2): 50 ug via INTRAVENOUS

## 2013-05-12 MED ORDER — KETOROLAC TROMETHAMINE 15 MG/ML IJ SOLN
15.0000 mg | Freq: Four times a day (QID) | INTRAMUSCULAR | Status: AC
  Administered 2013-05-12 – 2013-05-13 (×3): 15 mg via INTRAVENOUS
  Filled 2013-05-12 (×4): qty 1

## 2013-05-12 MED ORDER — PROMETHAZINE HCL 25 MG/ML IJ SOLN: 12.5000 mg | Freq: Four times a day (QID) | INTRAMUSCULAR | Status: DC | PRN

## 2013-05-12 MED ORDER — MIDAZOLAM HCL 5 MG/5ML IJ SOLN
INTRAMUSCULAR | Status: DC | PRN
  Administered 2013-05-12: 2 mg via INTRAVENOUS

## 2013-05-12 MED ORDER — FERROUS SULFATE 325 (65 FE) MG PO TABS
325.0000 mg | ORAL_TABLET | Freq: Two times a day (BID) | ORAL | Status: DC
  Administered 2013-05-12 – 2013-05-15 (×6): 325 mg via ORAL
  Filled 2013-05-12 (×8): qty 1

## 2013-05-12 MED ORDER — INSULIN DETEMIR 100 UNIT/ML ~~LOC~~ SOLN: 20.0000 [IU] | Freq: Two times a day (BID) | SUBCUTANEOUS | Status: DC

## 2013-05-12 MED ORDER — ONDANSETRON HCL 4 MG/2ML IJ SOLN
INTRAMUSCULAR | Status: DC | PRN
  Administered 2013-05-12: 4 mg via INTRAVENOUS

## 2013-05-12 MED ORDER — OXYCODONE-ACETAMINOPHEN 5-325 MG PO TABS
1.0000 | ORAL_TABLET | ORAL | Status: DC | PRN
  Administered 2013-05-12 – 2013-05-15 (×11): 2 via ORAL
  Filled 2013-05-12 (×12): qty 2

## 2013-05-12 MED ORDER — INSULIN ASPART 100 UNIT/ML ~~LOC~~ SOLN
0.0000 [IU] | Freq: Three times a day (TID) | SUBCUTANEOUS | Status: DC
  Administered 2013-05-12 – 2013-05-13 (×4): 3 [IU] via SUBCUTANEOUS
  Administered 2013-05-14: 5 [IU] via SUBCUTANEOUS
  Administered 2013-05-14 – 2013-05-15 (×2): 2 [IU] via SUBCUTANEOUS

## 2013-05-12 MED ORDER — HYDROMORPHONE HCL PF 1 MG/ML IJ SOLN
1.0000 mg | INTRAMUSCULAR | Status: DC | PRN
  Administered 2013-05-12 – 2013-05-14 (×3): 1 mg via INTRAVENOUS
  Filled 2013-05-12 (×3): qty 1

## 2013-05-12 MED ORDER — ROCURONIUM BROMIDE 100 MG/10ML IV SOLN
INTRAVENOUS | Status: DC | PRN
  Administered 2013-05-12: 50 mg via INTRAVENOUS

## 2013-05-12 MED ORDER — HYDROMORPHONE HCL PF 1 MG/ML IJ SOLN
0.2500 mg | INTRAMUSCULAR | Status: DC | PRN
  Administered 2013-05-12: 0.5 mg via INTRAVENOUS

## 2013-05-12 MED ORDER — BUPIVACAINE HCL (PF) 0.25 % IJ SOLN
INTRAMUSCULAR | Status: AC
  Filled 2013-05-12: qty 30

## 2013-05-12 MED ORDER — ATORVASTATIN CALCIUM 40 MG PO TABS
40.0000 mg | ORAL_TABLET | Freq: Two times a day (BID) | ORAL | Status: DC
  Administered 2013-05-12 – 2013-05-15 (×6): 40 mg via ORAL
  Filled 2013-05-12 (×7): qty 1

## 2013-05-12 MED ORDER — NEOSTIGMINE METHYLSULFATE 1 MG/ML IJ SOLN
INTRAMUSCULAR | Status: DC | PRN
  Administered 2013-05-12: 3 mg via INTRAVENOUS

## 2013-05-12 MED ORDER — ZOLPIDEM TARTRATE 5 MG PO TABS: 5.0000 mg | ORAL_TABLET | Freq: Every evening | ORAL | Status: DC | PRN

## 2013-05-12 MED ORDER — METFORMIN HCL 500 MG PO TABS
500.0000 mg | ORAL_TABLET | Freq: Two times a day (BID) | ORAL | Status: DC
  Administered 2013-05-12 – 2013-05-15 (×6): 500 mg via ORAL
  Filled 2013-05-12 (×8): qty 1

## 2013-05-12 MED ORDER — SODIUM CHLORIDE 0.9 % IR SOLN
Status: DC | PRN
  Administered 2013-05-12: 1000 mL

## 2013-05-12 MED ORDER — ASPIRIN EC 325 MG PO TBEC
325.0000 mg | DELAYED_RELEASE_TABLET | Freq: Two times a day (BID) | ORAL | Status: DC
  Administered 2013-05-12 – 2013-05-15 (×6): 325 mg via ORAL
  Filled 2013-05-12 (×8): qty 1

## 2013-05-12 SURGICAL SUPPLY — 65 items
APL SKNCLS STERI-STRIP NONHPOA (GAUZE/BANDAGES/DRESSINGS) ×1
BANDAGE ELASTIC 6 VELCRO ST LF (GAUZE/BANDAGES/DRESSINGS) ×1 IMPLANT
BANDAGE ESMARK 6X9 LF (GAUZE/BANDAGES/DRESSINGS) ×1 IMPLANT
BENZOIN TINCTURE PRP APPL 2/3 (GAUZE/BANDAGES/DRESSINGS) ×2 IMPLANT
BLADE SAGITTAL 25.0X1.19X90 (BLADE) ×2 IMPLANT
BLADE SAW SAG 90X13X1.27 (BLADE) ×2 IMPLANT
BNDG CMPR 9X6 STRL LF SNTH (GAUZE/BANDAGES/DRESSINGS) ×1
BNDG ESMARK 6X9 LF (GAUZE/BANDAGES/DRESSINGS) ×2
BOWL SMART MIX CTS (DISPOSABLE) ×2 IMPLANT
CAPT RP KNEE ×1 IMPLANT
CEMENT HV SMART SET (Cement) ×4 IMPLANT
CLOTH BEACON ORANGE TIMEOUT ST (SAFETY) ×2 IMPLANT
COVER SURGICAL LIGHT HANDLE (MISCELLANEOUS) ×2 IMPLANT
CUFF TOURNIQUET SINGLE 34IN LL (TOURNIQUET CUFF) ×2 IMPLANT
CUFF TOURNIQUET SINGLE 44IN (TOURNIQUET CUFF) IMPLANT
DRAPE EXTREMITY T 121X128X90 (DRAPE) ×2 IMPLANT
DRAPE U-SHAPE 47X51 STRL (DRAPES) ×2 IMPLANT
DRSG PAD ABDOMINAL 8X10 ST (GAUZE/BANDAGES/DRESSINGS) ×2 IMPLANT
DURAPREP 26ML APPLICATOR (WOUND CARE) ×2 IMPLANT
ELECT REM PT RETURN 9FT ADLT (ELECTROSURGICAL) ×2
ELECTRODE REM PT RTRN 9FT ADLT (ELECTROSURGICAL) ×1 IMPLANT
EVACUATOR 1/8 PVC DRAIN (DRAIN) ×2 IMPLANT
FACESHIELD LNG OPTICON STERILE (SAFETY) ×2 IMPLANT
GAUZE XEROFORM 5X9 LF (GAUZE/BANDAGES/DRESSINGS) ×2 IMPLANT
GLOVE BIOGEL PI IND STRL 8 (GLOVE) ×2 IMPLANT
GLOVE BIOGEL PI INDICATOR 8 (GLOVE) ×2
GLOVE ECLIPSE 7.5 STRL STRAW (GLOVE) ×5 IMPLANT
GOWN PREVENTION PLUS LG XLONG (DISPOSABLE) IMPLANT
GOWN STRL NON-REIN LRG LVL3 (GOWN DISPOSABLE) ×3 IMPLANT
GOWN STRL REIN XL XLG (GOWN DISPOSABLE) ×4 IMPLANT
HANDPIECE INTERPULSE COAX TIP (DISPOSABLE) ×2
HOOD PEEL AWAY FACE SHEILD DIS (HOOD) ×6 IMPLANT
IMMOBILIZER KNEE 20 (SOFTGOODS)
IMMOBILIZER KNEE 20 THIGH 36 (SOFTGOODS) IMPLANT
IMMOBILIZER KNEE 22 UNIV (SOFTGOODS) ×2 IMPLANT
IMMOBILIZER KNEE 24 THIGH 36 (MISCELLANEOUS) IMPLANT
IMMOBILIZER KNEE 24 UNIV (MISCELLANEOUS)
KIT BASIN OR (CUSTOM PROCEDURE TRAY) ×2 IMPLANT
KIT ROOM TURNOVER OR (KITS) ×2 IMPLANT
MANIFOLD NEPTUNE II (INSTRUMENTS) ×2 IMPLANT
NDL HYPO 25GX1X1/2 BEV (NEEDLE) IMPLANT
NEEDLE HYPO 25GX1X1/2 BEV (NEEDLE) ×2 IMPLANT
NS IRRIG 1000ML POUR BTL (IV SOLUTION) ×2 IMPLANT
PACK TOTAL JOINT (CUSTOM PROCEDURE TRAY) ×2 IMPLANT
PAD ARMBOARD 7.5X6 YLW CONV (MISCELLANEOUS) ×4 IMPLANT
PAD CAST 4YDX4 CTTN HI CHSV (CAST SUPPLIES) ×1 IMPLANT
PADDING CAST ABS 4INX4YD NS (CAST SUPPLIES) ×1
PADDING CAST ABS COTTON 4X4 ST (CAST SUPPLIES) IMPLANT
PADDING CAST COTTON 4X4 STRL (CAST SUPPLIES) ×2
SET HNDPC FAN SPRY TIP SCT (DISPOSABLE) ×1 IMPLANT
SPONGE GAUZE 4X4 12PLY (GAUZE/BANDAGES/DRESSINGS) ×2 IMPLANT
STAPLER VISISTAT 35W (STAPLE) ×1 IMPLANT
STRIP CLOSURE SKIN 1/2X4 (GAUZE/BANDAGES/DRESSINGS) ×2 IMPLANT
SUCTION FRAZIER TIP 10 FR DISP (SUCTIONS) ×2 IMPLANT
SUT MON AB 3-0 SH 27 (SUTURE) ×2
SUT MON AB 3-0 SH27 (SUTURE) IMPLANT
SUT VIC AB 0 CTB1 27 (SUTURE) ×4 IMPLANT
SUT VIC AB 1 CT1 27 (SUTURE) ×4
SUT VIC AB 1 CT1 27XBRD ANBCTR (SUTURE) ×2 IMPLANT
SUT VIC AB 2-0 CTB1 (SUTURE) ×4 IMPLANT
SYR CONTROL 10ML LL (SYRINGE) IMPLANT
TOWEL OR 17X24 6PK STRL BLUE (TOWEL DISPOSABLE) ×2 IMPLANT
TOWEL OR 17X26 10 PK STRL BLUE (TOWEL DISPOSABLE) ×2 IMPLANT
TRAY FOLEY CATH 16FRSI W/METER (SET/KITS/TRAYS/PACK) ×2 IMPLANT
WATER STERILE IRR 1000ML POUR (IV SOLUTION) ×4 IMPLANT

## 2013-05-12 NOTE — Anesthesia Procedure Notes (Addendum)
Procedure Name: Intubation Date/Time: 05/12/2013 8:00 AM Performed by: Armandina Gemma Pre-anesthesia Checklist: Patient identified, Timeout performed, Emergency Drugs available, Suction available and Patient being monitored Patient Re-evaluated:Patient Re-evaluated prior to inductionOxygen Delivery Method: Circle system utilized Preoxygenation: Pre-oxygenation with 100% oxygen Intubation Type: IV induction Ventilation: Mask ventilation without difficulty Laryngoscope Size: Miller and 2 Tube type: Oral Tube size: 7.0 mm Number of attempts: 1 Airway Equipment and Method: Stylet and LTA kit utilized Placement Confirmation: ETT inserted through vocal cords under direct vision,  breath sounds checked- equal and bilateral and positive ETCO2 Secured at: 21 cm Tube secured with: Tape Dental Injury: Teeth and Oropharynx as per pre-operative assessment  Comments: IV induction Edwards- intubation AM CRNA- atraumatic teeth and mouth as preop   Anesthesia Regional Block:  Femoral nerve block  Pre-Anesthetic Checklist: ,, timeout performed, Correct Patient, Correct Site, Correct Laterality, Correct Procedure, Correct Position, site marked, Risks and benefits discussed,  Surgical consent,  Pre-op evaluation,  At surgeon's request and post-op pain management  Laterality: Right  Prep: chloraprep       Needles:   Needle Type: Echogenic Stimulator Needle          Additional Needles:  Procedures: Doppler guided and nerve stimulator Femoral nerve block  Nerve Stimulator or Paresthesia:  Response: 0.5 mA,   Additional Responses:   Narrative:  Start time: 05/12/2013 7:25 AM End time: 05/12/2013 7:40 AM Injection made incrementally with aspirations every 5 mL.  Performed by: Personally  Anesthesiologist: Dr. Randa Evens

## 2013-05-12 NOTE — Evaluation (Signed)
Physical Therapy Evaluation Patient Details Name: Kathy Reese MRN: [id number] DOB: 10-10-1950 Today's Date: 05/12/2013 Time: 1442-1500 PT Time Calculation (min): 18 min  PT Assessment / Plan / Recommendation History of Present Illness  s/p elective Rt TKA  Clinical Impression  Pt is a 62 y.o. Female s/p Rt TKA POD#0 resulting in the deficits listed below (see PT Problem List). Pt limited in eval today due to "10/10" pain with WB through Rt LE.  Pt will benefit from skilled PT to increase their independence and safety with mobility to allow discharge to the venue listed below.      PT Assessment  Patient needs continued PT services    Follow Up Recommendations  Home health PT;Supervision/Assistance - 24 hour    Does the patient have the potential to tolerate intense rehabilitation      Barriers to Discharge   none    Equipment Recommendations  None recommended by PT    Recommendations for Other Services OT consult   Frequency 7X/week    Precautions / Restrictions Precautions Precautions: Fall;Knee Required Braces or Orthoses: Knee Immobilizer - Right Knee Immobilizer - Right: Discontinue once straight leg raise with < 10 degree lag Restrictions Weight Bearing Restrictions: Yes RLE Weight Bearing: Weight bearing as tolerated   Pertinent Vitals/Pain 10/10; pt premedicated by RN       Mobility  Bed Mobility Bed Mobility: Supine to Sit;Sitting - Scoot to Edge of Bed Supine to Sit: 4: Min assist;HOB elevated;With rails Sitting - Scoot to Edge of Bed: With rail;4: Min assist Details for Bed Mobility Assistance: (A) to advance and control Rt LE to/off EOB; required increased time due to pain; vc's for hand placement and sequencing  Transfers Transfers: Sit to Stand;Stand to Sit Sit to Stand: 1: +2 Total assist;From bed;With upper extremity assist Sit to Stand: Patient Percentage: 60% Stand to Sit: 3: Mod assist;To chair/3-in-1;With upper extremity assist;With  armrests Details for Transfer Assistance: pt required increased (A) due to pain to achieve upright standing position; vc's for hand placement and safety with RW  Ambulation/Gait Ambulation/Gait Assistance: 3: Mod assist Ambulation Distance (Feet): 3 Feet Assistive device: Rolling walker Ambulation/Gait Assistance Details: Pt required facilitation to shift weight and advacne Rt LE; pt c/o 10/10 with activity and WB through Rt LE which limited amb distance; vc's for gt sequencing; required (A) to manage RW  Gait Pattern: Step-to pattern;Decreased weight shift to right;Decreased stance time - right;Decreased step length - left;Trunk flexed Gait velocity: signifcantly decreased  Stairs: No Wheelchair Mobility Wheelchair Mobility: No    Exercises Total Joint Exercises Ankle Circles/Pumps: AROM;15 reps;Both;Supine;Seated   PT Diagnosis: Difficulty walking;Acute pain;Generalized weakness  PT Problem List: Decreased strength;Decreased activity tolerance;Decreased balance;Decreased range of motion;Decreased mobility;Decreased knowledge of use of DME;Pain PT Treatment Interventions: DME instruction;Stair training;Gait training;Functional mobility training;Therapeutic activities;Therapeutic exercise;Balance training;Neuromuscular re-education;Patient/family education     PT Goals(Current goals can be found in the care plan section) Acute Rehab PT Goals Patient Stated Goal: to have less pain PT Goal Formulation: With patient Time For Goal Achievement: 05/19/13 Potential to Achieve Goals: Good  Visit Information  Last PT Received On: 05/12/13 Assistance Needed: +2 History of Present Illness: s/p elective Rt TKA       Prior Functioning  Home Living Family/patient expects to be discharged to:: Private residence Living Arrangements: Spouse/significant other Available Help at Discharge: Family;Available 24 hours/day Type of Home: House Home Access: Stairs to enter [employer] of  Steps: 2 Entrance Stairs-Rails: None Home Layout: One level Home  Equipment: Cane - single point;Bedside commode;Walker - 2 wheels Prior Function Level of Independence: Independent (used SPC only when pain was increased ) Communication Communication: No difficulties Dominant Hand: Right    Cognition  Cognition Arousal/Alertness: Awake/alert Behavior During Therapy: WFL for tasks assessed/performed Overall Cognitive Status: Within Functional Limits for tasks assessed    Extremity/Trunk Assessment Upper Extremity Assessment Upper Extremity Assessment: Defer to OT evaluation Lower Extremity Assessment Lower Extremity Assessment: RLE deficits/detail RLE Deficits / Details: ankle WFL; unable to assess knee fully due to pain RLE: Unable to fully assess due to pain RLE Sensation: decreased light touch Cervical / Trunk Assessment Cervical / Trunk Assessment: Kyphotic   Balance Balance Balance Assessed: Yes Static Sitting Balance Static Sitting - Balance Support: Bilateral upper extremity supported;Feet supported Static Sitting - Level of Assistance: 5: Stand by assistance  End of Session PT - End of Session Equipment Utilized During Treatment: Gait belt;Right knee immobilizer Activity Tolerance: Patient limited by pain Patient left: in chair;with call bell/phone within reach;with family/visitor present;with nursing/sitter in room Nurse Communication: Mobility status;Patient requests pain meds CPM Right Knee CPM Right Knee: Off Right Knee Flexion (Degrees): 60 Right Knee Extension (Degrees): 0  GP     Shelva Majestic [place], [place] [phone] 05/12/2013, 3:08 PM

## 2013-05-12 NOTE — Progress Notes (Signed)
Utilization Review Completed.Shizuko Wojdyla T8/05/2013  

## 2013-05-12 NOTE — Anesthesia Postprocedure Evaluation (Signed)
  Anesthesia Post-op Note  Patient: Kathy Reese  Procedure(s) Performed: Procedure(s): TOTAL KNEE ARTHROPLASTY (Right)  Patient Location: PACU  Anesthesia Type:General  Level of Consciousness: awake  Airway and Oxygen Therapy: Patient Spontanous Breathing  Post-op Pain: mild  Post-op Assessment: Post-op Vital signs reviewed  Post-op Vital Signs: Reviewed  Complications: No apparent anesthesia complications

## 2013-05-12 NOTE — H&P (Signed)
TOTAL KNEE ADMISSION H&P  Patient is being admitted for right total knee arthroplasty.  Subjective:  Chief Complaint:right knee pain.  HPI: Kathy Reese, 62 y.o. female, has a history of pain and functional disability in the right knee due to arthritis and has failed non-surgical conservative treatments for greater than 12 weeks to includeNSAID's and/or analgesics, corticosteriod injections, viscosupplementation injections, supervised PT with diminished ADL's post treatment and weight reduction as appropriate.  Onset of symptoms was gradual, starting 3 years ago with gradually worsening course since that time. The patient noted no past surgery on the right knee(s).  Patient currently rates pain in the right knee(s) at 9 out of 10 with activity. Patient has night pain, worsening of pain with activity and weight bearing, pain that interferes with activities of daily living, pain with passive range of motion and joint swelling.  Patient has evidence of subchondral sclerosis, periarticular osteophytes and joint subluxation by imaging studies. This patient has had failure of all conservative care. There is no active infection.  Patient Active Problem List   Diagnosis Date Noted  . HYPERLIPIDEMIA 12/30/2007  . HYPERTENSION 12/30/2007  . INSOMNIA 12/30/2007  . HYPERSOMNIA 12/30/2007  . DM w/o Complication Type II 12/16/2007  . HYPERCHOLESTEROLEMIA 12/16/2007  . EXOGENOUS OBESITY 12/16/2007  . SLEEP APNEA, OBSTRUCTIVE 12/16/2007  . ALLERGIC RHINITIS 12/16/2007   Past Medical History  Diagnosis Date  . Allergic rhinitis, cause unspecified   . Type II or unspecified type diabetes mellitus without mention of complication, not stated as uncontrolled   . Other and unspecified hyperlipidemia   . Unspecified essential hypertension   . Hypersomnia   . PONV (postoperative nausea and vomiting)   . OSA on CPAP     ? what yr or exactly when  . Diverticulosis   . Arthritis     Past Surgical History   Procedure Laterality Date  . Tonsillectomy    . Total abdominal hysterectomy w/ bilateral salpingoophorectomy    . Trigger finger release  07/06/2012    Procedure: RELEASE TRIGGER FINGER/A-1 PULLEY;  Surgeon: Harvie Junior, MD;  Location: [facility];  Service: Orthopedics;  Laterality: Left;  left ring finger  . Abdominal hysterectomy    . Eye surgery Left     cataracts    Prescriptions prior to admission  Medication Sig Dispense Refill  . aspirin 81 MG tablet Take 81 mg by mouth daily.      Marland Kitchen atorvastatin (LIPITOR) 40 MG tablet Take 40 mg by mouth 2 (two) times daily.       . Calcium Carbonate (CALTRATE 600 PO) Take 1 tablet by mouth 2 (two) times daily.      . insulin detemir (LEVEMIR) 100 UNIT/ML injection Inject 20-25 Units into the skin 2 (two) times daily. 25 qam and 20 qhs      . metFORMIN (GLUCOPHAGE) 500 MG tablet Take 500 mg by mouth 2 (two) times daily with a meal.      . metoprolol (TOPROL-XL) 50 MG 24 hr tablet Take 50 mg by mouth daily.        Marland Kitchen ofloxacin (OCUFLOX) 0.3 % ophthalmic solution Place 1 drop into the left eye 2 (two) times daily.      . valsartan (DIOVAN) 160 MG tablet Take 160 mg by mouth daily.       No Known Allergies  History  Substance Use Topics  . Smoking status: Never Smoker   . Smokeless tobacco: Never Used  . Alcohol Use: No  Family History  Problem Relation Age of Onset  . Lung cancer Mother   . Colon cancer Father   . Stroke Brother   . Stroke Sister      ROS  Objective:  Physical Exam  Vital signs in last 24 hours: Temp:  [98.2 F (36.8 C)] 98.2 F (36.8 C) (08/08 0602) Pulse Rate:  [63] 63 (08/08 0602) Resp:  [18] 18 (08/08 0602) BP: (155)/(82) 155/82 mmHg (08/08 0602) SpO2:  [100 %] 100 % (08/08 0602)  Labs:   Estimated body mass index is 40.75 kg/(m^2) as calculated from the following:   Height as of 03/22/13: 5\' 3"  (1.6 m).   Weight as of 03/22/13: 104.327 kg (230 lb).   Imaging Review Plain  radiographs demonstrate severe degenerative joint disease of the right knee(s). The overall alignment ismild varus. The bone quality appears to be good for age and reported activity level.  Assessment/Plan:  End stage arthritis, right knee   The patient history, physical examination, clinical judgment of the provider and imaging studies are consistent with end stage degenerative joint disease of the right knee(s) and total knee arthroplasty is deemed medically necessary. The treatment options including medical management, injection therapy arthroscopy and arthroplasty were discussed at length. The risks and benefits of total knee arthroplasty were presented and reviewed. The risks due to aseptic loosening, infection, stiffness, patella tracking problems, thromboembolic complications and other imponderables were discussed. The patient acknowledged the explanation, agreed to proceed with the plan and consent was signed. Patient is being admitted for inpatient treatment for surgery, pain control, PT, OT, prophylactic antibiotics, VTE prophylaxis, progressive ambulation and ADL's and discharge planning. The patient is planning to be discharged home with home health services

## 2013-05-12 NOTE — Transfer of Care (Signed)
Immediate Anesthesia Transfer of Care Note  Patient: Kathy Reese  Procedure(s) Performed: Procedure(s): TOTAL KNEE ARTHROPLASTY (Right)  Patient Location: PACU  Anesthesia Type:General  Level of Consciousness: awake, alert  and oriented  Airway & Oxygen Therapy: Patient Spontanous Breathing and Patient connected to nasal cannula oxygen  Post-op Assessment: Report given to PACU RN and Post -op Vital signs reviewed and stable  Post vital signs: Reviewed and stable  Complications: No apparent anesthesia complications

## 2013-05-12 NOTE — Progress Notes (Signed)
Orthopedic Tech Progress Note Patient Details:  [name] [birth year] Patient just came out of CPM after being in for several hours after surgery. Is now sitting up in chair eating lunch. Patient to go back in CPM tonight.  Patient ID: Kathy Reese, female   DOB: Feb 21, 1951, 62 y.o.   MRN: [id number]   Orie Rout 05/12/2013, 3:19 PM

## 2013-05-12 NOTE — Anesthesia Preprocedure Evaluation (Signed)
Anesthesia Evaluation  Patient identified by MRN, date of birth, ID band Patient awake    Reviewed: Allergy & Precautions, H&P , NPO status   Airway Mallampati: II      Dental   Pulmonary sleep apnea ,          Cardiovascular hypertension,     Neuro/Psych    GI/Hepatic   Endo/Other  diabetes  Renal/GU      Musculoskeletal   Abdominal   Peds  Hematology   Anesthesia Other Findings   Reproductive/Obstetrics                           Anesthesia Physical Anesthesia Plan  ASA: III  Anesthesia Plan: General   Post-op Pain Management:    Induction:   Airway Management Planned: Oral ETT  Additional Equipment:   Intra-op Plan:   Post-operative Plan: Extubation in OR  Informed Consent:   Dental advisory given  Plan Discussed with:   Anesthesia Plan Comments:         Anesthesia Quick Evaluation

## 2013-05-12 NOTE — Progress Notes (Signed)
Orthopedic Tech Progress Note Patient Details:  Kathy Reese 06/30/1951 [id number] CPM applied to Right LE with appropriate settings. OHF applied to bed.  CPM Right Knee CPM Right Knee: On Right Knee Flexion (Degrees): 60 Right Knee Extension (Degrees): 0   Asia R Thompson 05/12/2013, 12:13 PM

## 2013-05-12 NOTE — Brief Op Note (Signed)
05/12/2013  9:42 AM  PATIENT:  Kathy Reese  62 y.o. female  PRE-OPERATIVE DIAGNOSIS:  DEGENERATIVE JOINT   POST-OPERATIVE DIAGNOSIS:  degenerative joint  PROCEDURE:  Procedure(s): TOTAL KNEE ARTHROPLASTY (Right)  SURGEON:  Surgeon(s) and Role:    * Harvie Junior, MD - Primary  PHYSICIAN ASSISTANT:   ASSISTANTS: bethune   ANESTHESIA:   general  EBL:  Total I/O In: 1000 [I.V.:1000] Out: 300 [Urine:250; Blood:50]  BLOOD ADMINISTERED:none  DRAINS: (1) Hemovact drain(s) in the r knee with  Suction Open   LOCAL MEDICATIONS USED:  NONE  SPECIMEN:  No Specimen  DISPOSITION OF SPECIMEN:  N/A  COUNTS:  YES  TOURNIQUET:   Total Tourniquet Time Documented: Thigh (Right) - 59 minutes Total: Thigh (Right) - 59 minutes   DICTATION: .Other Dictation: Dictation Number [id number]  PLAN OF CARE: Admit to inpatient   PATIENT DISPOSITION:  PACU - hemodynamically stable.   Delay start of Pharmacological VTE agent (>24hrs) due to surgical blood loss or risk of bleeding: no

## 2013-05-12 NOTE — Progress Notes (Signed)
[facility]  Patient Status: New  [facility] is providing the following services: PT - referral from MD office. On the schedule for a PT visit at home on Monday.  If patient discharges after hours, please call [phone].   Jodene Nam 05/12/2013, 5:10 PM

## 2013-05-12 NOTE — Preoperative (Signed)
Beta Blockers   Reason not to administer Beta Blockers:Not Applicable 

## 2013-05-13 LAB — GLUCOSE, CAPILLARY
Glucose-Capillary: 166 mg/dL — ABNORMAL HIGH (ref 70–99)
Glucose-Capillary: 192 mg/dL — ABNORMAL HIGH (ref 70–99)
Glucose-Capillary: 175 mg/dL — ABNORMAL HIGH (ref 70–99)

## 2013-05-13 LAB — BASIC METABOLIC PANEL
BUN: 11 mg/dL (ref 6–23)
CO2: 27 mEq/L (ref 19–32)
Calcium: 8.1 mg/dL — ABNORMAL LOW (ref 8.4–10.5)
Chloride: 101 mEq/L (ref 96–112)
Creatinine, Ser: 0.62 mg/dL (ref 0.50–1.10)
GFR calc Af Amer: >90 mL/min (ref >90)
GFR calc non Af Amer: >90 mL/min (ref >90)
Glucose, Bld: 155 mg/dL — ABNORMAL HIGH (ref 70–99)
Potassium: 3.4 mEq/L — ABNORMAL LOW (ref 3.5–5.1)
Sodium: 137 mEq/L (ref 135–145)

## 2013-05-13 LAB — CBC
HCT: 33.7 % — ABNORMAL LOW (ref 36.0–46.0)
Hemoglobin: 11.1 g/dL — ABNORMAL LOW (ref 12.0–15.0)
MCH: 29.7 pg (ref 26.0–34.0)
MCHC: 32.9 g/dL (ref 30.0–36.0)
MCV: 90.1 fL (ref 78.0–100.0)
Platelets: 178 10*3/uL (ref 150–400)
RBC: 3.74 MIL/uL — ABNORMAL LOW (ref 3.87–5.11)
RDW: 13.7 % (ref 11.5–15.5)
WBC: 8.6 10*3/uL (ref 4.0–10.5)

## 2013-05-13 NOTE — Op Note (Signed)
NAME:  Kathy Reese, BIAS NO.:  [id number]  MEDICAL RECORD NO.:  [id number]  LOCATION:  [id number]                        FACILITY:  [facility]  PHYSICIAN:  Harvie Junior, M.D.   DATE OF BIRTH:  1951/07/12  DATE OF PROCEDURE:  05/12/2013 DATE OF DISCHARGE:                              OPERATIVE REPORT   PREOPERATIVE DIAGNOSIS:  End-stage degenerative joint disease, right knee.  POSTOPERATIVE DIAGNOSIS:  End-stage degenerative joint disease, right knee.  PROCEDURE:  Right total knee replacement with a Sigma system, size 2.5 femur, size 2.5 tibia, 10 mm bridging bearing, and a 32 mm all polyethylene patella.  SURGEON:  Harvie Junior, M.D.  ASSISTANT:  Marshia Ly, P.A.  ANESTHESIA:  General.  BRIEF HISTORY:  Kathy Reese is a 62 year old female with history of having had severe complaints of arthritic pain in her right knee.  She has been treated conservatively for prolonged period of time and failed all conservative care.  She was having significant night pain and light activity pain.  After failure of all conservative care, and x-ray showing bone-on-bone change.  The patient was taken to the operating room for right total knee replacement.  PROCEDURE IN DETAIL:  The patient was taken to the operating room. After adequate anesthesia was obtained with general anesthetic, patient was placed supine on the operating table.  The right leg was then prepped and draped in sterile fashion.  Following this, the leg was exsanguinated.  Tourniquet was inflated to 350 mmHg.  Following this, a midline incision was made in the subcutaneous tissue and dissected down to the level of the extensor mechanism and a medial parapatellar arthrotomy was undertaken.  Once this was completed, attention was turned to the knee where medial and lateral meniscus were removed retropatellar fat pad, and synovium in the anterior aspect of the femur. Once this was done, attention was turned to the  tibia, which was cut perpendicular to its long axis.  Attention was turned to the femur, which is cut with a 5-degree valgus attitude with an intramedullary rod as alignment.  Once this was done, the spacing block was put in place. The patient does come into full extension with a 10 block.  Following this, attention was turned towards the femur where it sized to a 2.5. Anterior and posterior cuts were made with chamfers and box.  Attention was then turned to the tibia, which was sized to a 2.5.  It was drilled and keeled and the trial components were then put into place with a 2.5 tibia and 2.5 femur.  The patella is very small and cut down to a level of 13 mm and a 32 trial chosen.  It was generous for her patella, though there was no smaller size.  Once this was done, attention turned towards the trial components.  The knee was put through range of motion; excellent stability and range of motion was achieved at this time.  Once this was completed, attention was turned towards the knee, which is copious and thoroughly lavaged with pulsatile lavage irrigation and suctioned dry.  The final components were opened and there then cemented into place size 2.5, tibia size 2.5  femur, a 10 mm bridging bearing trial was placed and a 32 all poly patella was placed and held with a clamp.  The knee was then put through a range of motion, and excellent stability and full range of motion was achieved.  At this point, tourniquet was let down.  A 1 g of tranexamic acid was given during the surgery about 30 minutes after incision and was just completing at the time of letting the tourniquet down.  All bleeding was controlled with electrocautery.  Trial poly was removed.  The final poly is put in place.  Excellent range of motion and stability of the knee was achieved.  A medium Hemovac drain was placed.  The medial parapatellar arthrotomy was closed with a #1 Vicryl running suture.  The skin was then  closed with 0 and 2-0 Vicryl and 3-0 Monocryl subcuticular sutures. Benzoin and Steri-Strips were applied.  Sterile compressive dressing were applied.  The patient was taken to the recovery room and was noted in satisfactory condition.  Estimated blood loss for this procedure was minimal.     Harvie Junior, M.D.     Ranae Plumber  D:  05/12/2013  T:  05/13/2013  Job:  [id number]

## 2013-05-13 NOTE — Progress Notes (Signed)
Physical Therapy Treatment Patient Details Name: Kathy Reese MRN: [id number] DOB: Jun 26, 1951 Today's Date: 05/13/2013 Time: 8756-4332 PT Time Calculation (min): 27 min  PT Assessment / Plan / Recommendation  History of Present Illness s/p elective Rt TKA   PT Comments   Patient progressing with ambulation and therex. Family present this afternoon and pleased with progress. Will continue with current POC and plan that patient will DC on Monday  Follow Up Recommendations  Home health PT;Supervision/Assistance - 24 hour     Does the patient have the potential to tolerate intense rehabilitation     Barriers to Discharge        Equipment Recommendations  None recommended by PT    Recommendations for Other Services OT consult  Frequency 7X/week   Progress towards PT Goals Progress towards PT goals: Progressing toward goals  Plan Current plan remains appropriate    Precautions / Restrictions Precautions Precautions: Fall;Knee Required Braces or Orthoses: Knee Immobilizer - Right Knee Immobilizer - Right: Discontinue once straight leg raise with < 10 degree lag Restrictions RLE Weight Bearing: Weight bearing as tolerated   Pertinent Vitals/Pain 8/10 R knee pain. patient repositioned and iced for comfort     Mobility  Bed Mobility Bed Mobility: Sit to Supine Supine to Sit: 4: Min assist;HOB elevated;With rails Sitting - Scoot to Edge of Bed: With rail;4: Min assist Sit to Supine: 4: Min assist Details for Bed Mobility Assistance: (A) to advance and control Rt LE to/off EOB; required increased time due to pain; vc's for hand placement and sequencing  Transfers Sit to Stand: 3: Mod assist;With upper extremity assist;From bed Stand to Sit: 4: Min assist;With upper extremity assist;To bed Details for Transfer Assistance: A for R LE. Cues for safe technique Ambulation/Gait Ambulation/Gait Assistance: 4: Min guard Ambulation Distance (Feet): 50 Feet Assistive device: Rolling  walker Ambulation/Gait Assistance Details: Cues to increase step length.  Gait Pattern: Step-to pattern;Decreased weight shift to right;Decreased stance time - right;Decreased step length - left;Trunk flexed Gait velocity: decreased    Exercises Total Joint Exercises Quad Sets: AROM;Right;10 reps Heel Slides: AAROM;Right;10 reps Hip ABduction/ADduction: Right;AAROM;10 reps Straight Leg Raises: AAROM;Right;10 reps   PT Diagnosis:    PT Problem List:   PT Treatment Interventions:     PT Goals (current goals can now be found in the care plan section)    Visit Information  Last PT Received On: 05/13/13 Assistance Needed: +2 History of Present Illness: s/p elective Rt TKA    Subjective Data      Cognition  Cognition Arousal/Alertness: Awake/alert Behavior During Therapy: WFL for tasks assessed/performed Overall Cognitive Status: Within Functional Limits for tasks assessed    Balance     End of Session PT - End of Session Equipment Utilized During Treatment: Gait belt;Right knee immobilizer Activity Tolerance: Patient tolerated treatment well Patient left: in chair;with call bell/phone within reach;with family/visitor present;with nursing/sitter in room Nurse Communication: Mobility status;Patient requests pain meds   GP     Robinette, Adline Potter 05/13/2013, 1:40 PM 05/13/2013 Fredrich Birks PTA [phone] pager [phone] office

## 2013-05-13 NOTE — Progress Notes (Signed)
Orthopedic Tech Progress Note Patient Details:  Kathy Reese, Kathy Reese [id number] On cpm at 8:00 pm RLE 0-40 Patient ID: Kathy Reese, female   DOB: [birth year], 62 y.o.   MRN: [id number]   Kathy Reese 05/13/2013, 8:06 PM

## 2013-05-13 NOTE — Progress Notes (Signed)
PATIENT ID: Kathy Reese   1 Day Post-Op Procedure(s) (LRB): TOTAL KNEE ARTHROPLASTY (Right)  Subjective: Reports soreness in right knee put overall pain under control. Eating food this am and tolerating well. Ready to get up with PT today.   Objective:  Filed Vitals:   05/13/13 0547  BP: 110/54  Pulse: 64  Temp: 98.8 F (37.1 C)  Resp: 18     R knee dressing c/d/i Drain removed today Mild swelling distally, calf soft, nontender Wiggles toes, distally nvi  Labs:   Recent Labs  05/13/13 0545  HGB 11.1*   Recent Labs  05/13/13 0545  WBC 8.6  RBC 3.74*  HCT 33.7*  PLT 178   Recent Labs  05/13/13 0545  NA 137  K 3.4*  CL 101  CO2 27  BUN 11  CREATININE 0.62  GLUCOSE 155*  CALCIUM 8.1*    Assessment and Plan: 1 day s/p TKA, right Will get up with PT today WBAT anticipate  D/c home tomorrow or Monday Cont current pain mgmt  VTE proph: ASA 325mg  BID, SCDs

## 2013-05-13 NOTE — Progress Notes (Signed)
Physical Therapy Treatment Patient Details Name: LINETH THIELKE MRN: [id number] DOB: 1950/11/16 Today's Date: 05/13/2013 Time: 6213-0865 PT Time Calculation (min): 31 min  PT Assessment / Plan / Recommendation  History of Present Illness s/p elective Rt TKA   PT Comments   Patient progressing with ambulation this morning. Still limited by pain. Will attempt to increase ambulation and add more therex this afternoon  Follow Up Recommendations  Home health PT;Supervision/Assistance - 24 hour     Does the patient have the potential to tolerate intense rehabilitation     Barriers to Discharge        Equipment Recommendations       Recommendations for Other Services    Frequency 7X/week   Progress towards PT Goals Progress towards PT goals: Progressing toward goals  Plan Current plan remains appropriate    Precautions / Restrictions Precautions Precautions: Fall;Knee Required Braces or Orthoses: Knee Immobilizer - Right Knee Immobilizer - Right: Discontinue once straight leg raise with < 10 degree lag Restrictions RLE Weight Bearing: Weight bearing as tolerated   Pertinent Vitals/Pain 8/10 knee pain. patient repositioned for comfort     Mobility  Bed Mobility Supine to Sit: 4: Min assist;HOB elevated;With rails Sitting - Scoot to Edge of Bed: With rail;4: Min assist Details for Bed Mobility Assistance: (A) to advance and control Rt LE to/off EOB; required increased time due to pain; vc's for hand placement and sequencing  Transfers Sit to Stand: 1: +2 Total assist;From bed;With upper extremity assist;From toilet Sit to Stand: Patient Percentage: 80% Stand to Sit: 4: Min assist;To toilet;To chair/3-in-1 Details for Transfer Assistance: pt required increased (A) due to pain to achieve upright standing position; vc's for hand placement and safety with RW  Ambulation/Gait Ambulation/Gait Assistance: 4: Min assist Ambulation Distance (Feet): 20 Feet Assistive device: Rolling  walker Ambulation/Gait Assistance Details: A for RW management and stability Gait Pattern: Step-to pattern;Decreased weight shift to right;Decreased stance time - right;Decreased step length - left;Trunk flexed Gait velocity: decreased    Exercises Total Joint Exercises Quad Sets: AROM;Right;10 reps Heel Slides: AAROM;Right;10 reps   PT Diagnosis:    PT Problem List:   PT Treatment Interventions:     PT Goals (current goals can now be found in the care plan section)    Visit Information  Last PT Received On: 05/13/13 Assistance Needed: +2 (for ambulation) History of Present Illness: s/p elective Rt TKA    Subjective Data      Cognition  Cognition Arousal/Alertness: Awake/alert Behavior During Therapy: WFL for tasks assessed/performed Overall Cognitive Status: Within Functional Limits for tasks assessed    Balance     End of Session PT - End of Session Equipment Utilized During Treatment: Gait belt;Right knee immobilizer Activity Tolerance: Patient limited by pain Patient left: in chair;with call bell/phone within reach;with family/visitor present;with nursing/sitter in room Nurse Communication: Mobility status;Patient requests pain meds   GP     Fredrich Birks 05/13/2013, 9:25 AM 05/13/2013 Fredrich Birks PTA [phone] pager [phone] office

## 2013-05-14 LAB — GLUCOSE, CAPILLARY
Glucose-Capillary: 149 mg/dL — ABNORMAL HIGH (ref 70–99)
Glucose-Capillary: 134 mg/dL — ABNORMAL HIGH (ref 70–99)
Glucose-Capillary: 214 mg/dL — ABNORMAL HIGH (ref 70–99)
Glucose-Capillary: 121 mg/dL — ABNORMAL HIGH (ref 70–99)
Glucose-Capillary: 213 mg/dL — ABNORMAL HIGH (ref 70–99)

## 2013-05-14 LAB — CBC
HCT: 31.4 % — ABNORMAL LOW (ref 36.0–46.0)
Hemoglobin: 10.6 g/dL — ABNORMAL LOW (ref 12.0–15.0)
MCH: 30 pg (ref 26.0–34.0)
MCHC: 33.8 g/dL (ref 30.0–36.0)
MCV: 89 fL (ref 78.0–100.0)
Platelets: 172 10*3/uL (ref 150–400)
RBC: 3.53 MIL/uL — ABNORMAL LOW (ref 3.87–5.11)
RDW: 13.9 % (ref 11.5–15.5)
WBC: 9.5 10*3/uL (ref 4.0–10.5)

## 2013-05-14 LAB — BASIC METABOLIC PANEL
BUN: 8 mg/dL (ref 6–23)
CO2: 28 mEq/L (ref 19–32)
Calcium: 8.5 mg/dL (ref 8.4–10.5)
Chloride: 103 mEq/L (ref 96–112)
Creatinine, Ser: 0.57 mg/dL (ref 0.50–1.10)
GFR calc Af Amer: >90 mL/min (ref >90)
GFR calc non Af Amer: >90 mL/min (ref >90)
Glucose, Bld: 144 mg/dL — ABNORMAL HIGH (ref 70–99)
Potassium: 3.5 mEq/L (ref 3.5–5.1)
Sodium: 138 mEq/L (ref 135–145)

## 2013-05-14 NOTE — Progress Notes (Signed)
PATIENT ID: Kathy Reese   2 Days Post-Op Procedure(s) (LRB): TOTAL KNEE ARTHROPLASTY (Right)  Subjective: Reports she is feeling well today. Encouraged by her work with PT yesterday. Reports pain is well controlled and she is comfortable.   Objective:  Filed Vitals:   05/14/13 0544  BP: 151/69  Pulse: 69  Temp: 99.2 F (37.3 C)  Resp: 18     R knee dressing removed today and mepliex applied Incision benign, no erythema, warmth Mild swelling distally, calf soft, nontender  Wiggles toes, distally nvi   Labs:   Recent Labs  05/13/13 0545 05/14/13 0645  HGB 11.1* 10.6*   Recent Labs  05/13/13 0545 05/14/13 0645  WBC 8.6 9.5  RBC 3.74* 3.53*  HCT 33.7* 31.4*  PLT 178 172   Recent Labs  05/13/13 0545 05/14/13 0645  NA 137 138  K 3.4* 3.5  CL 101 103  CO2 27 28  BUN 11 8  CREATININE 0.62 0.57  GLUCOSE 155* 144*  CALCIUM 8.1* 8.5    Assessment and Plan: 2 days s/p TKA, right  Will work with PT again today WBAT, CPM anticipate D/c home tomorrow with home health PT Cont current pain mgmt   VTE proph: ASA 325mg  BID, SCDs

## 2013-05-14 NOTE — Progress Notes (Signed)
Reviewed and agree;  Mykela Mewborn, PT [phone]  

## 2013-05-14 NOTE — Progress Notes (Signed)
Physical Therapy Treatment Patient Details Name: Kathy Reese MRN: [id number] DOB: July 30, 1951 Today's Date: 05/14/2013 Time: 1133-1206 PT Time Calculation (min): 33 min  PT Assessment / Plan / Recommendation  History of Present Illness s/p elective Rt TKA   PT Comments   Pt progressing with ambulation and stair training. Performed exercises in bed, limited by pain. Simulated home environment by getting out of bed with step stool.  Taught pt stair descent with RW from bed, family was also present and taught stair technique. Pt reported lightheadedness upon standing and initial ambulation that did not resolve.  Pt required two standing rest breaks during ambulation and needed to sit down reporting lightheadedness and fatigue.   Pt will benefit from continued PT to increase independence and safety with mobility. D/C plan remains appropriate.   Follow Up Recommendations  Home health PT;Supervision/Assistance - 24 hour                 Frequency 7X/week   Progress towards PT Goals Progress towards PT goals: Progressing toward goals  Plan Current plan remains appropriate    Precautions / Restrictions Precautions Precautions: Fall;Knee Restrictions Weight Bearing Restrictions: Yes RLE Weight Bearing: Weight bearing as tolerated   Pertinent Vitals/Pain Pt reported 4/10 pain with exercises and ambulation, repositioned for comfort.    Mobility  Bed Mobility Bed Mobility: Supine to Sit;Sitting - Scoot to Edge of Bed Supine to Sit: 4: Min assist;HOB flat (Pt needed UE assist) Sitting - Scoot to Edge of Bed: 4: Min guard Details for Bed Mobility Assistance:  (Pt required increased time, cueing for hand placement) Transfers Transfers: Sit to Stand;Stand to Sit Sit to Stand: 4: Min assist;From bed;With upper extremity assist Stand to Sit: 4: Min guard;With upper extremity assist;With armrests;To chair/3-in-1 Details for Transfer Assistance: Pt needed assistance with RLE and cues for  hand placement Ambulation/Gait Ambulation/Gait Assistance: 4: Min guard Ambulation Distance (Feet): 35 Feet Assistive device: Rolling walker Ambulation/Gait Assistance Details: Pt required two standing rest breaks, reported feeling lightheaded and needed to sit down after ambulating, second person helpful for following behind with chair for concern of increasing lightheadedness. Verbal cues for upright posture. Gait Pattern: Step-to pattern;Decreased weight shift to right;Decreased stride length;Antalgic Gait velocity: decreased General Gait Details: Pt reported that she had recently ambulated ~50 feet earlier in the day.  Pt required 2 standing rest breaks and reported lightheadedness that did not get better while ambulating. Stairs: Yes Stairs Assistance: 4: Min assist Stairs Assistance Details (indicate cue type and reason): Pt taught to go down one step first with bad leg then follow with the LLE with RW. Pt uses step stool at home to get in/out of bed, and we used a step to simulate.  Stair Management Technique: No rails;Forwards;With walker;Step to pattern Number of Stairs: 1    Exercises Total Joint Exercises Quad Sets: 5 reps;Right;AROM Short Arc Quad: AAROM;10 reps;Right;Supine Straight Leg Raises: AAROM;Right;10 reps;Supine     PT Goals (current goals can now be found in the care plan section) Acute Rehab PT Goals Time For Goal Achievement: 05/19/13 Potential to Achieve Goals: Good  Visit Information  Last PT Received On: 05/14/13 Assistance Needed: +2 History of Present Illness: s/p elective Rt TKA       Cognition  Cognition Arousal/Alertness: Awake/alert Behavior During Therapy: WFL for tasks assessed/performed Overall Cognitive Status: Within Functional Limits for tasks assessed    Balance    Standing balanced assessed with weight shift to RLE without knee immobilizer, pt stable on  R knee with RW  End of Session PT - End of Session Equipment Utilized During  Treatment: Gait belt Activity Tolerance: Patient tolerated treatment well Patient left: in chair;with call bell/phone within reach;with family/visitor present Nurse Communication: Mobility status   GP     Faye Ramsay 05/14/2013, 2:11 PM

## 2013-05-14 NOTE — Progress Notes (Signed)
Orthopedic Tech Progress Note Patient Details:  Kathy Reese 1950/11/02 [id number] On cpm at 3:00 pm RLE 0-45 Patient ID: [name], female   DOB: 1951/04/01, 62 y.o.   MRN: [id number]   Jennye Moccasin 05/14/2013, 3:08 PM

## 2013-05-14 NOTE — Progress Notes (Signed)
Physical Therapy Treatment Patient Details Name: Kathy Reese MRN: [id number] DOB: 03/01/1951 Today's Date: 05/14/2013 Time: 8469-6295 PT Time Calculation (min): 32 min  PT Assessment / Plan / Recommendation  History of Present Illness     PT Comments   Pt progressing towards goals. Pt required increased time with bed mobility due to pain.  Pt able to ambulate much further compared to this morning.  Performed 1 step up/down at edge of bed and in gym, both without rails. Pt required two standing rest breaks with ambulation, no reported lightheadedness.   D/C plan remains appropriate.  Follow Up Recommendations  Home health PT;Supervision/Assistance - 24 hour                 Frequency 7X/week   Progress towards PT Goals Progress towards PT goals: Progressing toward goals  Plan Current plan remains appropriate    Precautions / Restrictions Precautions Precautions: Fall;Knee Restrictions Weight Bearing Restrictions: Yes RLE Weight Bearing: Weight bearing as tolerated   Pertinent Vitals/Pain Pt reported 3/10 with ambulation, repositioned for comfort.     Mobility  Bed Mobility Bed Mobility: Supine to Sit;Sit to Supine;Sitting - Scoot to Edge of Bed Supine to Sit: 4: Min assist;HOB flat Sitting - Scoot to [place] of Bed: 4: Min guard Sit to Supine: 4: Min assist;HOB flat Details for Bed Mobility Assistance: Pt needed UE assist with supine to sit and RLE assist with sit to supine Transfers Transfers: Sit to Stand;Stand to Sit Sit to Stand: 4: Min assist;From bed;With upper extremity assist Stand to Sit: 4: Min guard;To bed;With upper extremity assist Details for Transfer Assistance: Pt needed cueing for technique and hand placement for pushing up into sitting and going from sit to supine Ambulation/Gait Ambulation/Gait Assistance: 4: Min guard Ambulation Distance (Feet): 125 Feet Assistive device: Rolling walker Ambulation/Gait Assistance Details: Pt required two standing  rest breaks, reported no dizziness/lightheadedness.  Decreased gait velocity with increased distance. Gait Pattern: Step-to pattern;Step-through pattern;Decreased stride length;Antalgic;Decreased weight shift to right Gait velocity: decreased General Gait Details: Pt required two standing rest breaks, decreased velocity with increased distance. Stairs: Yes Stairs Assistance: 4: Min assist Stairs Assistance Details (indicate cue type and reason): Pt descended stair in/out of bed with step stool. Pt also performed 1 step without rails with RW and proper technique. Stair Management Technique: No rails;Step to pattern;Backwards;Forwards;With walker Number of Stairs: 2      PT Goals (current goals can now be found in the care plan section) Acute Rehab PT Goals PT Goal Formulation: With patient Time For Goal Achievement: 05/19/13 Potential to Achieve Goals: Good  Visit Information  Last PT Received On: 05/14/13 Assistance Needed: +1       Cognition  Cognition Arousal/Alertness: Awake/alert;Lethargic (Lathargic at beginning session, quickly became awake/alert) Behavior During Therapy: WFL for tasks assessed/performed Overall Cognitive Status: Within Functional Limits for tasks assessed       End of Session PT - End of Session Equipment Utilized During Treatment: Gait belt Activity Tolerance: Patient tolerated treatment well Patient left: in bed;in CPM;with call bell/phone within reach;with family/visitor present   GP     Faye Ramsay 05/14/2013, 4:11 PM

## 2013-05-15 ENCOUNTER — Encounter (HOSPITAL_COMMUNITY): Payer: Self-pay | Admitting: Orthopedic Surgery

## 2013-05-15 LAB — CBC
HCT: 32 % — ABNORMAL LOW (ref 36.0–46.0)
Hemoglobin: 10.8 g/dL — ABNORMAL LOW (ref 12.0–15.0)
MCH: 30 pg (ref 26.0–34.0)
MCHC: 33.8 g/dL (ref 30.0–36.0)
MCV: 88.9 fL (ref 78.0–100.0)
Platelets: 171 10*3/uL (ref 150–400)
RBC: 3.6 MIL/uL — ABNORMAL LOW (ref 3.87–5.11)
RDW: 13.6 % (ref 11.5–15.5)
WBC: 8.5 10*3/uL (ref 4.0–10.5)

## 2013-05-15 LAB — BASIC METABOLIC PANEL
BUN: 9 mg/dL (ref 6–23)
CO2: 29 mEq/L (ref 19–32)
Calcium: 8.9 mg/dL (ref 8.4–10.5)
Chloride: 104 mEq/L (ref 96–112)
Creatinine, Ser: 0.53 mg/dL (ref 0.50–1.10)
GFR calc Af Amer: >90 mL/min (ref >90)
GFR calc non Af Amer: >90 mL/min (ref >90)
Glucose, Bld: 133 mg/dL — ABNORMAL HIGH (ref 70–99)
Potassium: 3.8 mEq/L (ref 3.5–5.1)
Sodium: 141 mEq/L (ref 135–145)

## 2013-05-15 LAB — GLUCOSE, CAPILLARY
Glucose-Capillary: 135 mg/dL — ABNORMAL HIGH (ref 70–99)
Glucose-Capillary: 111 mg/dL — ABNORMAL HIGH (ref 70–99)

## 2013-05-15 MED ORDER — OXYCODONE-ACETAMINOPHEN 5-325 MG PO TABS: 1.0000 | ORAL_TABLET | ORAL | Status: DC | PRN

## 2013-05-15 MED ORDER — ASPIRIN 325 MG PO TBEC: 325.0000 mg | DELAYED_RELEASE_TABLET | Freq: Two times a day (BID) | ORAL | Status: DC

## 2013-05-15 MED ORDER — METHOCARBAMOL 750 MG PO TABS: 750.0000 mg | ORAL_TABLET | Freq: Three times a day (TID) | ORAL | Status: DC

## 2013-05-15 NOTE — Progress Notes (Signed)
Reviewed and agree;  Rasheka Denard, PT [phone]  

## 2013-05-15 NOTE — Progress Notes (Signed)
Subjective: 3 Days Post-Op Procedure(s) (LRB): TOTAL KNEE ARTHROPLASTY (Right) Patient reports pain as 4 on 0-10 scale.   Progressing with PT. Taking po/voiding.  Objective: Vital signs in last 24 hours: Temp:  [97.9 F (36.6 C)-99.3 F (37.4 C)] 98.8 F (37.1 C) (08/11 0659) Pulse Rate:  [62-75] 62 (08/11 0659) Resp:  [18] 18 (08/11 0659) BP: (121-157)/(46-64) 145/46 mmHg (08/11 0659) SpO2:  [94 %-97 %] 97 % (08/11 0659)  Intake/Output from previous day: 08/10 0701 - 08/11 0700 In: 2040 [P.O.:2040] Out: -  Intake/Output this shift:     Recent Labs  05/13/13 0545 05/14/13 0645 05/15/13 0500  HGB 11.1* 10.6* 10.8*    Recent Labs  05/14/13 0645 05/15/13 0500  WBC 9.5 8.5  RBC 3.53* 3.60*  HCT 31.4* 32.0*  PLT 172 171    Recent Labs  05/14/13 0645 05/15/13 0500  NA 138 141  K 3.5 3.8  CL 103 104  CO2 28 29  BUN 8 9  CREATININE 0.57 0.53  GLUCOSE 144* 133*  CALCIUM 8.5 8.9   Right knee exam: Dressing clean and dry. Calf soft and non tender. NV intact distally. Assessment/Plan: 3 Days Post-Op Procedure(s) (LRB): TOTAL KNEE ARTHROPLASTY (Right) Plan: Discharge home with home health  Aziza Stuckert G 05/15/2013, 8:09 AM

## 2013-05-15 NOTE — Discharge Summary (Signed)
Patient ID: Kathy Reese MRN: [id number] DOB/AGE: 01/03/1951 62 y.o.  Admit date: 05/12/2013 Discharge date: 05/15/2013  Admission Diagnoses:  Principal Problem:   Osteoarthritis of right knee Active Problems:   DM w/o Complication Type II   EXOGENOUS OBESITY   SLEEP APNEA, OBSTRUCTIVE   Discharge Diagnoses:  Same  Past Medical History  Diagnosis Date  . Allergic rhinitis, cause unspecified   . Type II or unspecified type diabetes mellitus without mention of complication, not stated as uncontrolled   . Other and unspecified hyperlipidemia   . Unspecified essential hypertension   . Hypersomnia   . PONV (postoperative nausea and vomiting)   . OSA on CPAP     ? what yr or exactly when  . Diverticulosis   . Arthritis   . Family history of anesthesia complication     NIECE HAD DIFFICULTY WAKING UP    Surgeries: Procedure(s):RIGHT TOTAL KNEE ARTHROPLASTY on 05/12/2013    Discharged Condition: Improved  Hospital Course: Kathy Reese is an 62 y.o. female who was admitted 05/12/2013 for operative treatment ofOsteoarthritis of right knee. Patient has severe unremitting pain that affects sleep, daily activities, and work/hobbies. After pre-op clearance the patient was taken to the operating room on 05/12/2013 and underwent  Procedure(s): TOTAL KNEE ARTHROPLASTY.    Patient was given perioperative antibiotics: Anti-infectives   Start     Dose/Rate Route Frequency Ordered Stop   05/12/13 1430  ceFAZolin (ANCEF) IVPB 2 g/50 mL premix     2 g 100 mL/hr over 30 Minutes Intravenous Every 6 hours 05/12/13 1338 05/12/13 2355   05/12/13 0836  cefUROXime (ZINACEF) injection  Status:  Discontinued       As needed 05/12/13 0836 05/12/13 0956   05/12/13 0600  ceFAZolin (ANCEF) IVPB 2 g/50 mL premix     2 g 100 mL/hr over 30 Minutes Intravenous 30 min pre-op 05/11/13 2038 05/12/13 0803       Patient was given sequential compression devices, early ambulation, and chemoprophylaxis to prevent  DVT.  Patient benefited maximally from hospital stay and there were no complications.    Recent vital signs: Patient Vitals for the past 24 hrs:  BP Temp Pulse Resp SpO2  05/15/13 0659 145/46 mmHg 98.8 F (37.1 C) 62 18 97 %  05/14/13 2342 - 98.5 F (36.9 C) 75 18 96 %  05/14/13 2131 157/64 mmHg 99.3 F (37.4 C) 74 18 95 %  05/14/13 1358 121/55 mmHg 97.9 F (36.6 C) 67 18 94 %     Recent laboratory studies:  Recent Labs  05/14/13 0645 05/15/13 0500  WBC 9.5 8.5  HGB 10.6* 10.8*  HCT 31.4* 32.0*  PLT 172 171  NA 138 141  K 3.5 3.8  CL 103 104  CO2 28 29  BUN 8 9  CREATININE 0.57 0.53  GLUCOSE 144* 133*  CALCIUM 8.5 8.9     Discharge Medications:     Medication List    STOP taking these medications       aspirin 81 MG tablet  Replaced by:  aspirin 325 MG EC tablet      TAKE these medications       aspirin 325 MG EC tablet  Take 1 tablet (325 mg total) by mouth 2 (two) times daily after a meal.     atorvastatin 40 MG tablet  Commonly known as:  LIPITOR  Take 40 mg by mouth 2 (two) times daily.     CALTRATE 600 PO  Take 1 tablet  by mouth 2 (two) times daily.     insulin detemir 100 UNIT/ML injection  Commonly known as:  LEVEMIR  Inject 20-25 Units into the skin 2 (two) times daily. 25 qam and 20 qhs     metFORMIN 500 MG tablet  Commonly known as:  GLUCOPHAGE  Take 500 mg by mouth 2 (two) times daily with a meal.     methocarbamol 750 MG tablet  Commonly known as:  ROBAXIN-750  Take 1 tablet (750 mg total) by mouth 3 (three) times daily. Prn spasm.     metoprolol succinate 50 MG 24 hr tablet  Commonly known as:  TOPROL-XL  Take 50 mg by mouth daily.     ofloxacin 0.3 % ophthalmic solution  Commonly known as:  OCUFLOX  Place 1 drop into the left eye 2 (two) times daily.     oxyCODONE-acetaminophen 5-325 MG per tablet  Commonly known as:  PERCOCET/ROXICET  Take 1-2 tablets by mouth every 4 (four) hours as needed for pain.     valsartan 160 MG  tablet  Commonly known as:  DIOVAN  Take 160 mg by mouth daily.        Diagnostic Studies: Dg Chest 2 View  05/12/2013   *RADIOLOGY REPORT*  Clinical Data: Preoperative respiratory films.  CHEST - 2 VIEW  Comparison: Single view of the chest 11/17/2004.  Findings: Lungs are clear.  Heart size is normal.  No pneumothorax or pleural effusion.  No focal bony abnormality.  IMPRESSION: Negative chest.   Original Report Authenticated By: Holley Dexter, M.D.    Disposition: 01-Home or Self Care      Discharge Orders   Future Appointments Provider Department Dept Phone   08/15/2013 8:30 AM Lauro Franklin, FNP [facility] [phone]   Future Orders Complete By Expires     CPM  As directed     Comments:      Continuous passive motion machine (CPM):      Use the CPM from 0 degrees  to 60 degrees for 6-8 hours per day.      You may increase by 10 degrees per day.  You may break it up into 2 or 3 sessions per day.      Use CPM for 1-2 weeks or until you are told to stop.    Call MD / Call 911  As directed     Comments:      If you experience chest pain or shortness of breath, CALL 911 and be transported to the hospital emergency room.  If you develope a fever above 101 F, pus (white drainage) or increased drainage or redness at the wound, or calf pain, call your surgeon's office.    Constipation Prevention  As directed     Comments:      Drink plenty of fluids.  Prune juice may be helpful.  You may use a stool softener, such as Colace (over the counter) 100 mg twice a day.  Use MiraLax (over the counter) for constipation as needed.    Diet Carb Modified  As directed     Do not put a pillow under the knee. Place it under the heel.  As directed     Increase activity slowly as tolerated  As directed     Weight bearing as tolerated  As directed        Follow-up Information   Follow up with GRAVES,JOHN L, MD. Schedule an appointment as soon as possible for a visit in  2 weeks.   Contact information:   [address] [place] [address] [phone]        Signed: Matthew Folks 05/15/2013, 8:17 AM

## 2013-05-15 NOTE — Progress Notes (Signed)
05/15/13 Patient set up with [facility] for HHPT by MD office. Spoke with patient, no change in d/c plan.She states that she has a rolling walker and BSC at home. Contacted Debbie at [facility] and informed her that patient being d/c'd to home today. They will see patient 05/16/13. Jacquelynn Cree RN, BSN, CCM

## 2013-05-15 NOTE — Progress Notes (Signed)
D/C instruction and scripts given to Pt. Pt verbalized understanding of instructions and husband is on the way to take Pt home.

## 2013-05-15 NOTE — Progress Notes (Signed)
Physical Therapy Treatment Patient Details Name: Kathy Reese MRN: [id number] DOB: 1950/10/27 Today's Date: 05/15/2013 Time: 1308-6578 PT Time Calculation (min): 30 min  PT Assessment / Plan / Recommendation  History of Present Illness  s/p elective R TKA   PT Comments   Pt is progressing towards goals. Pain was 2/10 with bed mobility and ambulation.  Pt descended one step out of bed with RW and without rails with proper technique and sequence. Pt able to ambulate further today, with several short standing rest breaks, and no reported lightheadedness.  In regards to functional mobility, pt is ready to D/C home today safely with 24 hour supervision and assistance.   Follow Up Recommendations  Home health PT;Supervision/Assistance - 24 hour           Equipment Recommendations  None recommended by PT       Frequency 7X/week   Progress towards PT Goals Progress towards PT goals: Progressing toward goals  Plan Current plan remains appropriate    Precautions / Restrictions Precautions Precautions: Fall;Knee Restrictions Weight Bearing Restrictions: Yes RLE Weight Bearing: Weight bearing as tolerated   Pertinent Vitals/Pain 2/10 pain with bed mobility, transfers, and ambulation, pt repositioned at end of session for comfort.     Mobility  Bed Mobility Bed Mobility: Supine to Sit;Sitting - Scoot to Edge of Bed Supine to Sit: 4: Min guard;HOB elevated Sitting - Scoot to [place] of Bed: 5: Supervision Details for Bed Mobility Assistance: Cueing for hand placement and technique with sheet looped around RLE to assist with lowering leg out of bed. Transfers Transfers: Sit to Stand;Stand to Sit Sit to Stand: 4: Min guard;From bed Stand to Sit: 5: Supervision;With armrests;To chair/3-in-1 Details for Transfer Assistance: Pt demonstrated correct technique with transfers, some cueing for hand placement and RLE placement. Ambulation/Gait Ambulation/Gait Assistance: 4: Min guard;5:  Supervision Ambulation Distance (Feet): 290 Feet Assistive device: Rolling walker Ambulation/Gait Assistance Details: Pt required several short standing rest breaks, but reported no dizziness/lightheadedness. Gait Pattern: Step-to pattern;Step-through pattern;Decreased stride length;Decreased weight shift to right;Antalgic Gait velocity: decreased Stairs: Yes Stairs Assistance: 4: Min guard Stairs Assistance Details (indicate cue type and reason): Pt used step to get out of bed to simulate home environment, required some cueing for proper technique to descend step. Used RW, no rails. Stair Management Technique: No rails;Step to pattern;Forwards;With walker Number of Stairs: 1    Exercises Total Joint Exercises Quad Sets: 5 reps;Right;AROM     PT Goals (current goals can now be found in the care plan section) Acute Rehab PT Goals PT Goal Formulation: With patient Time For Goal Achievement: 05/19/13 Potential to Achieve Goals: Good  Visit Information  Last PT Received On: 05/15/13 Assistance Needed: +1       Cognition  Cognition Arousal/Alertness: Awake/alert Behavior During Therapy: WFL for tasks assessed/performed Overall Cognitive Status: Within Functional Limits for tasks assessed       End of Session PT - End of Session Equipment Utilized During Treatment: Gait belt Activity Tolerance: Patient tolerated treatment well Patient left: in chair;with call bell/phone within reach   GP     Faye Ramsay 05/15/2013, 10:01 AM

## 2013-05-15 NOTE — Progress Notes (Signed)
Reviewed and agree;  Lark Runk, PT [phone]  

## 2013-07-17 ENCOUNTER — Telehealth: Payer: Self-pay | Admitting: Internal Medicine

## 2013-07-17 NOTE — Telephone Encounter (Signed)
Spoke with patient-appointment has been made for Thursday 07-27-13 at 11:00am. Nothing more needed at this time.

## 2013-07-27 ENCOUNTER — Encounter: Payer: Self-pay | Admitting: Internal Medicine

## 2013-07-27 ENCOUNTER — Ambulatory Visit (INDEPENDENT_AMBULATORY_CARE_PROVIDER_SITE_OTHER): Payer: BC Managed Care – PPO | Admitting: Internal Medicine

## 2013-07-27 VITALS — BP 114/68 | HR 68 | Ht 64.0 in | Wt 225.6 lb

## 2013-07-27 DIAGNOSIS — G4710 Hypersomnia, unspecified: Secondary | ICD-10-CM

## 2013-07-27 DIAGNOSIS — G4733 Obstructive sleep apnea (adult) (pediatric): Secondary | ICD-10-CM

## 2013-07-27 NOTE — Progress Notes (Signed)
07/16/11- 62 year old female never smoker followed for obstructive sleep apnea, hypersomnia complicated by DM, HBP , allergic rhinitis. Last here -11/10/2010 Continues CPAP 14 CWP/[facility], using at all night every night. She says she still fights daytime sleepiness. I don't get a history of sleep process or cataplexy. She works a Water engineer job. Temazepam does help consolidate sleep. She uses Nuvigil once or rarely twice in a day but only uses a couple of them every 3 months or so.  02/15/12- 62 year old female never smoker followed for obstructive sleep apnea, hypersomnia complicated by DM, HBP , allergic rhinitis. Uses CPAP every night for approx 3-4 hours right now due to severe knee pain-may end up having knee surgery. Temazepam used to help her sleep. Work interferes with nap opportunities during the day. 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  07/27/13- 62 year old female never smoker followed for obstructive sleep apnea, hypersomnia complicated by DM, HBP , allergic rhinitis. FOLLOWS FOR:Had Rt.knee replacement 2 mths. ago,needs replacement supplies,wears CPAP 14/ Advanced doing well She says old machine needs replacement, not running right. She has not been needing Nuvigil while out of work after knee replacement.  ROS-See HPI Constitutional:   No-   weight loss, night sweats, fevers, chills, fatigue, lassitude. HEENT:   No-  headaches, difficulty swallowing, tooth/dental problems, sore throat,       No-  sneezing, itching, ear ache, nasal congestion, post nasal drip,  CV:  No-   chest pain, orthopnea, PND, swelling in lower extremities, anasarca, dizziness, palpitations Resp: No-   shortness of breath with exertion or at rest.              No-   productive cough,  No non-productive cough,  No-  coughing up of blood.              No-   change in color of mucus.  No- wheezing.   Skin: No-   rash or lesions. GI:  No-   heartburn, indigestion, abdominal pain,  nausea, vomiting,  GU:  MS:  + joint pain or swelling.   Neuro- grossly normal to observation Psych:  No- change in mood or affect. No depression or anxiety.  No memory loss.  OBJ General- Alert, Oriented, Affect-appropriate, Distress- none acute; obese, pleasant and cooperative Skin- rash-none, lesions- none, excoriation- none Lymphadenopathy- none Head- atraumatic            Eyes- Gross vision intact, PERRLA, conjunctivae clear secretions            Ears- Hearing, canals-normal            Nose- Clear, no-Septal dev, mucus, polyps, erosion, perforation             Throat- Mallampati III-IV , mucosa clear , drainage- none, tonsils- atrophic Neck- flexible , trachea midline, no stridor , thyroid nl, carotid no bruit Chest - symmetrical excursion , unlabored           Heart/CV- RRR , no murmur , no gallop  , no rub, nl s1 s2                           - JVD- none , edema- none, stasis changes- none, varices- none           Lung- clear to P&A, wheeze- none, cough- none , dullness-none, rub- none           Chest wall-  Abd-  Br/  Gen/ Rectal- Not done, not indicated Extrem- cyanosis- none, clubbing, none, atrophy- none, strength- nl Neuro- grossly intact to observation

## 2013-07-27 NOTE — Patient Instructions (Signed)
Order- [facility]- replacement for worn out CPAP machine 14 cwp, humidifier/ climate tubing, mask of choice, supplies   Dx OSA  Please call as needed

## 2013-08-10 ENCOUNTER — Telehealth: Payer: Self-pay | Admitting: Nurse Practitioner

## 2013-08-12 NOTE — Assessment & Plan Note (Signed)
Appropriate occasional use, careful not to aggravate insomnia at night. Refill as needed

## 2013-08-12 NOTE — Assessment & Plan Note (Signed)
Good compliance and control. Needs replacement for worn out machine 

## 2013-08-14 ENCOUNTER — Encounter: Payer: Self-pay | Admitting: Obstetrics and Gynecology

## 2013-08-15 ENCOUNTER — Ambulatory Visit: Payer: BC Managed Care – PPO | Admitting: Nurse Practitioner

## 2013-08-29 ENCOUNTER — Ambulatory Visit (INDEPENDENT_AMBULATORY_CARE_PROVIDER_SITE_OTHER): Payer: BC Managed Care – PPO | Admitting: Nurse Practitioner

## 2013-08-29 ENCOUNTER — Encounter: Payer: Self-pay | Admitting: Nurse Practitioner

## 2013-08-29 VITALS — BP 140/82 | HR 80 | Resp 16 | Ht 63.25 in | Wt 227.0 lb

## 2013-08-29 DIAGNOSIS — Z124 Encounter for screening for malignant neoplasm of cervix: Secondary | ICD-10-CM

## 2013-08-29 DIAGNOSIS — Z8543 Personal history of malignant neoplasm of ovary: Secondary | ICD-10-CM

## 2013-08-29 DIAGNOSIS — Z01419 Encounter for gynecological examination (general) (routine) without abnormal findings: Secondary | ICD-10-CM

## 2013-08-29 NOTE — Patient Instructions (Addendum)
General topics  Next pap or exam is  due in 1 year Take a Women's multivitamin Take 1200 mg. of calcium daily - prefer dietary If any concerns in interim to call back  Breast Self-Awareness Practicing breast self-awareness may pick up problems early, prevent significant medical complications, and possibly save your life. By practicing breast self-awareness, you can become familiar with how your breasts look and feel and if your breasts are changing. This allows you to notice changes early. It can also offer you some reassurance that your breast health is good. One way to learn what is normal for your breasts and whether your breasts are changing is to do a breast self-exam. If you find a lump or something that was not present in the past, it is best to contact your caregiver right away. Other findings that should be evaluated by your caregiver include nipple discharge, especially if it is bloody; skin changes or reddening; areas where the skin seems to be pulled in (retracted); or new lumps and bumps. Breast pain is seldom associated with cancer (malignancy), but should also be evaluated by a caregiver. BREAST SELF-EXAM The best time to examine your breasts is 5 7 days after your menstrual period is over.  ExitCare Patient Information 2013 [employer].   Exercise to Stay Healthy Exercise helps you become and stay healthy. EXERCISE IDEAS AND TIPS Choose exercises that:  You enjoy.  Fit into your day. You do not need to exercise really hard to be healthy. You can do exercises at a slow or medium level and stay healthy. You can:  Stretch before and after working out.  Try yoga, Pilates, or tai chi.  Lift weights.  Walk fast, swim, jog, run, climb stairs, bicycle, dance, or rollerskate.  Take aerobic classes. Exercises that burn about 150 calories:  Running 1  miles in 15 minutes.  Playing volleyball for 45 to 60 minutes.  Washing and waxing a car for 45 to 60  minutes.  Playing touch football for 45 minutes.  Walking 1  miles in 35 minutes.  Pushing a stroller 1  miles in 30 minutes.  Playing basketball for 30 minutes.  Raking leaves for 30 minutes.  Bicycling 5 miles in 30 minutes.  Walking 2 miles in 30 minutes.  Dancing for 30 minutes.  Shoveling snow for 15 minutes.  Swimming laps for 20 minutes.  Walking up stairs for 15 minutes.  Bicycling 4 miles in 15 minutes.  Gardening for 30 to 45 minutes.  Jumping rope for 15 minutes.  Washing windows or floors for 45 to 60 minutes. Document Released: 10/24/2010 Document Revised: 12/14/2011 Document Reviewed: 10/24/2010 ExitCare Patient Information 2013 [employer].   Other topics ( that may be useful information):    Sexually Transmitted Disease Sexually transmitted disease (STD) refers to any infection that is passed from person to person during sexual activity. This may happen by way of saliva, semen, blood, vaginal mucus, or urine. Common STDs include:  Gonorrhea.  Chlamydia.  Syphilis.  HIV/AIDS.  Genital herpes.  Hepatitis B and C.  Trichomonas.  Human papillomavirus (HPV).  Pubic lice. CAUSES  An STD may be spread by bacteria, virus, or parasite. A person can get an STD by:  Sexual intercourse with an infected person.  Sharing sex toys with an infected person.  Sharing needles with an infected person.  Having intimate contact with the genitals, mouth, or rectal areas of an infected person. SYMPTOMS  Some people may not have any symptoms, but   they can still pass the infection to others. Different STDs have different symptoms. Symptoms include:  Painful or bloody urination.  Pain in the pelvis, abdomen, vagina, anus, throat, or eyes.  Skin rash, itching, irritation, growths, or sores (lesions). These usually occur in the genital or anal area.  Abnormal vaginal discharge.  Penile discharge in men.  Soft, flesh-colored skin growths in the  genital or anal area.  Fever.  Pain or bleeding during sexual intercourse.  Swollen glands in the groin area.  Yellow skin and eyes (jaundice). This is seen with hepatitis. DIAGNOSIS  To make a diagnosis, your caregiver may:  Take a medical history.  Perform a physical exam.  Take a specimen (culture) to be examined.  Examine a sample of discharge under a microscope.  Perform blood test TREATMENT   Chlamydia, gonorrhea, trichomonas, and syphilis can be cured with antibiotic medicine.  Genital herpes, hepatitis, and HIV can be treated, but not cured, with prescribed medicines. The medicines will lessen the symptoms.  Genital warts from HPV can be treated with medicine or by freezing, burning (electrocautery), or surgery. Warts may come back.  HPV is a virus and cannot be cured with medicine or surgery.However, abnormal areas may be followed very closely by your caregiver and may be removed from the cervix, vagina, or vulva through office procedures or surgery. If your diagnosis is confirmed, your recent sexual partners need treatment. This is true even if they are symptom-free or have a negative culture or evaluation. They should not have sex until their caregiver says it is okay. HOME CARE INSTRUCTIONS  All sexual partners should be informed, tested, and treated for all STDs.  Take your antibiotics as directed. Finish them even if you start to feel better.  Only take over-the-counter or prescription medicines for pain, discomfort, or fever as directed by your caregiver.  Rest.  Eat a balanced diet and drink enough fluids to keep your urine clear or pale yellow.  Do not have sex until treatment is completed and you have followed up with your caregiver. STDs should be checked after treatment.  Keep all follow-up appointments, Pap tests, and blood tests as directed by your caregiver.  Only use latex condoms and water-soluble lubricants during sexual activity. Do not use  petroleum jelly or oils.  Avoid alcohol and illegal drugs.  Get vaccinated for HPV and hepatitis. If you have not received these vaccines in the past, talk to your caregiver about whether one or both might be right for you.  Avoid risky sex practices that can break the skin. The only way to avoid getting an STD is to avoid all sexual activity.Latex condoms and dental dams (for oral sex) will help lessen the risk of getting an STD, but will not completely eliminate the risk. SEEK MEDICAL CARE IF:   You have a fever.  You have any new or worsening symptoms. Document Released: 12/12/2002 Document Revised: 12/14/2011 Document Reviewed: 12/19/2010 ExitCare Patient Information 2013 [employer].    Domestic Abuse You are being battered or abused if someone close to you hits, pushes, or physically hurts you in any way. You also are being abused if you are forced into activities. You are being sexually abused if you are forced to have sexual contact of any kind. You are being emotionally abused if you are made to feel worthless or if you are constantly threatened. It is important to remember that help is available. No one has the right to abuse you. PREVENTION OF FURTHER   ABUSE  Learn the warning signs of danger. This varies with situations but may include: the use of alcohol, threats, isolation from friends and family, or forced sexual contact. Leave if you feel that violence is going to occur.  If you are attacked or beaten, report it to the police so the abuse is documented. You do not have to press charges. The police can protect you while you or the attackers are leaving. Get the officer's name and badge number and a copy of the report.  Find someone you can trust and tell them what is happening to you: your caregiver, a nurse, clergy member, close friend or family member. Feeling ashamed is natural, but remember that you have done nothing wrong. No one deserves abuse. Document Released:  09/18/2000 Document Revised: 12/14/2011 Document Reviewed: 11/27/2010 ExitCare Patient Information 2013 [employer].    How Much is Too Much Alcohol? Drinking too much alcohol can cause injury, accidents, and health problems. These types of problems can include:   Car crashes.  Falls.  Family fighting (domestic violence).  Drowning.  Fights.  Injuries.  Burns.  Damage to certain organs.  Having a baby with birth defects. ONE DRINK CAN BE TOO MUCH WHEN YOU ARE:  Working.  Pregnant or breastfeeding.  Taking medicines. Ask your doctor.  Driving or planning to drive. If you or someone you know has a drinking problem, get help from a doctor.  Document Released: 07/18/2009 Document Revised: 12/14/2011 Document Reviewed: 07/18/2009 ExitCare Patient Information 2013 [employer].   Smoking Hazards Smoking cigarettes is extremely bad for your health. Tobacco smoke has over 200 known poisons in it. There are over 60 chemicals in tobacco smoke that cause cancer. Some of the chemicals found in cigarette smoke include:   Cyanide.  Benzene.  Formaldehyde.  Methanol (wood alcohol).  Acetylene (fuel used in welding torches).  Ammonia. Cigarette smoke also contains the poisonous gases nitrogen oxide and carbon monoxide.  Cigarette smokers have an increased risk of many serious medical problems and Smoking causes approximately:  90% of all lung cancer deaths in men.  80% of all lung cancer deaths in women.  90% of deaths from chronic obstructive lung disease. Compared with nonsmokers, smoking increases the risk of:  Coronary heart disease by 2 to 4 times.  Stroke by 2 to 4 times.  Men developing lung cancer by 23 times.  Women developing lung cancer by 13 times.  Dying from chronic obstructive lung diseases by 12 times.  . Smoking is the most preventable cause of death and disease in our society.  WHY IS SMOKING ADDICTIVE?  Nicotine is the chemical  agent in tobacco that is capable of causing addiction or dependence.  When you smoke and inhale, nicotine is absorbed rapidly into the bloodstream through your lungs. Nicotine absorbed through the lungs is capable of creating a powerful addiction. Both inhaled and non-inhaled nicotine may be addictive.  Addiction studies of cigarettes and spit tobacco show that addiction to nicotine occurs mainly during the teen years, when young people begin using tobacco products. WHAT ARE THE BENEFITS OF QUITTING?  There are many health benefits to quitting smoking.   Likelihood of developing cancer and heart disease decreases. Health improvements are seen almost immediately.  Blood pressure, pulse rate, and breathing patterns start returning to normal soon after quitting. QUITTING SMOKING   American Lung Association - 1-800-LUNGUSA  American Cancer Society - 1-800-ACS-2345 Document Released: 10/29/2004 Document Revised: 12/14/2011 Document Reviewed: 07/03/2009 ExitCare Patient Information 2013 [employer].   Stress Management Stress is a state of physical or mental tension that often results from changes in your life or normal routine. Some common causes of stress are:  Death of a loved one.  Injuries or severe illnesses.  Getting fired or changing jobs.  Moving into a new home. Other causes may be:  Sexual problems.  Business or financial losses.  Taking on a large debt.  Regular conflict with someone at home or at work.  Constant tiredness from lack of sleep. It is not just bad things that are stressful. It may be stressful to:  Win the lottery.  Get married.  Buy a new car. The amount of stress that can be easily tolerated varies from person to person. Changes generally cause stress, regardless of the types of change. Too much stress can affect your health. It may lead to physical or emotional problems. Too little stress (boredom) may also become stressful. SUGGESTIONS TO  REDUCE STRESS:  Talk things over with your family and friends. It often is helpful to share your concerns and worries. If you feel your problem is serious, you may want to get help from a professional counselor.  Consider your problems one at a time instead of lumping them all together. Trying to take care of everything at once may seem impossible. List all the things you need to do and then start with the most important one. Set a goal to accomplish 2 or 3 things each day. If you expect to do too many in a single day you will naturally fail, causing you to feel even more stressed.  Do not use alcohol or drugs to relieve stress. Although you may feel better for a short time, they do not remove the problems that caused the stress. They can also be habit forming.  Exercise regularly - at least 3 times per week. Physical exercise can help to relieve that "uptight" feeling and will relax you.  The shortest distance between despair and hope is often a good night's sleep.  Go to bed and get up on time allowing yourself time for appointments without being rushed.  Take a short "time-out" period from any stressful situation that occurs during the day. Close your eyes and take some deep breaths. Starting with the muscles in your face, tense them, hold it for a few seconds, then relax. Repeat this with the muscles in your neck, shoulders, hand, stomach, back and legs.  Take good care of yourself. Eat a balanced diet and get plenty of rest.  Schedule time for having fun. Take a break from your daily routine to relax. HOME CARE INSTRUCTIONS   Call if you feel overwhelmed by your problems and feel you can no longer manage them on your own.  Return immediately if you feel like hurting yourself or someone else. Document Released: 03/17/2001 Document Revised: 12/14/2011 Document Reviewed: 11/07/2007 ExitCare Patient Information 2013 [employer].  EXERCISE AND DIET:  We recommended that you start or  continue a regular exercise program for good health. Regular exercise means any activity that makes your heart beat faster and makes you sweat.  We recommend exercising at least 30 minutes per day at least 3 days a week, preferably 4 or 5.  We also recommend a diet low in fat and sugar.  Inactivity, poor dietary choices and obesity can cause diabetes, heart attack, stroke, and kidney damage, among others.    ALCOHOL AND SMOKING:  Women should limit their alcohol intake to no   more than 7 drinks/beers/glasses of wine (combined, not each!) per week. Moderation of alcohol intake to this level decreases your risk of breast cancer and liver damage. And of course, no recreational drugs are part of a healthy lifestyle.  And absolutely no smoking or even second hand smoke. Most people know smoking can cause heart and lung diseases, but did you know it also contributes to weakening of your bones? Aging of your skin?  Yellowing of your teeth and nails?  CALCIUM AND VITAMIN D:  Adequate intake of calcium and Vitamin D are recommended.  The recommendations for exact amounts of these supplements seem to change often, but generally speaking 600 mg of calcium (either carbonate or citrate) and 800 units of Vitamin D per day seems prudent. Certain women may benefit from higher intake of Vitamin D.  If you are among these women, your doctor will have told you during your visit.    PAP SMEARS:  Pap smears, to check for cervical cancer or precancers,  have traditionally been done yearly, although recent scientific advances have shown that most women can have pap smears less often.  However, every woman still should have a physical exam from her gynecologist every year. It will include a breast check, inspection of the vulva and vagina to check for abnormal growths or skin changes, a visual exam of the cervix, and then an exam to evaluate the size and shape of the uterus and ovaries.  And after 62 years of age, a rectal exam is  indicated to check for rectal cancers. We will also provide age appropriate advice regarding health maintenance, like when you should have certain vaccines, screening for sexually transmitted diseases, bone density testing, colonoscopy, mammograms, etc.   MAMMOGRAMS:  All women over 40 years old should have a yearly mammogram. Many facilities now offer a "3D" mammogram, which may cost around $50 extra out of pocket. If possible,  we recommend you accept the option to have the 3D mammogram performed.  It both reduces the number of women who will be called back for extra views which then turn out to be normal, and it is better than the routine mammogram at detecting truly abnormal areas.    COLONOSCOPY:  Colonoscopy to screen for colon cancer is recommended for all women at age 50.  We know, you hate the idea of the prep.  We agree, BUT, having colon cancer and not knowing it is worse!!  Colon cancer so often starts as a polyp that can be seen and removed at colonscopy, which can quite literally save your life!  And if your first colonoscopy is normal and you have no family history of colon cancer, most women don't have to have it again for 10 years.  Once every ten years, you can do something that may end up saving your life, right?  We will be happy to help you get it scheduled when you are ready.  Be sure to check your insurance coverage so you understand how much it will cost.  It may be covered as a preventative service at no cost, but you should check your particular policy.      

## 2013-08-29 NOTE — Progress Notes (Signed)
Patient ID: Kathy Reese, female   DOB: June 10, 1951, 62 y.o.   MRN: [id number] 62 y.o. [id number] Married African American Fe here for annual exam.   right knee replacement 05/12/13.  Will retire 09/04/13.  No LMP recorded. Patient has had a hysterectomy.          Sexually active: no  The current method of family planning is status post hysterectomy.    Exercising: yes  walking around building at work three times a day.  Pt will continuing walking daily at home after 09/04/13. Smoker:  no  Health Maintenance: Pap; 08/09/12, WNL MMG: 03/13/13, Bi-Rads 1: negative Colonoscopy: 03/22/13, repeat 10 years BMD: 04/14/06, Normal TDaP: UTD Labs: PCP   reports that she has never smoked. She has never used smokeless tobacco. She reports that she does not drink alcohol or use illicit drugs.  Past Medical History  Diagnosis Date  . Allergic rhinitis, cause unspecified   . Type II or unspecified type diabetes mellitus without mention of complication, not stated as uncontrolled   . Other and unspecified hyperlipidemia   . Unspecified essential hypertension   . Hypersomnia   . PONV (postoperative nausea and vomiting)   . OSA on CPAP     ? what yr or exactly when  . Diverticulosis   . Arthritis   . Family history of anesthesia complication     NIECE HAD DIFFICULTY WAKING UP  . Personal history of malignant neoplasm of ovary   . Depression     Past Surgical History  Procedure Laterality Date  . Tonsillectomy    . Total abdominal hysterectomy w/ bilateral salpingoophorectomy  5/94    secondary to uterine fibroids  . Trigger finger release  07/06/2012    Procedure: RELEASE TRIGGER FINGER/A-1 PULLEY;  Surgeon: Harvie Junior, MD;  Location: [facility];  Service: Orthopedics;  Laterality: Left;  left ring finger  . Eye surgery Left     cataracts  . Total knee arthroplasty Right 05/12/2013    Procedure: TOTAL KNEE ARTHROPLASTY;  Surgeon: Harvie Junior, MD;  Location: [facility];  Service:  Orthopedics;  Laterality: Right;  . Tubal ligation    . Laparoscopic salpingo oopherectomy Right 12/96  . Abdominal adhesion surgery  12/96    exc peritoneal cyst  . Breast biopsy Left 2000    stereotactic, negative  . Laparoscopy abdomen diagnostic  2/08    w/LOA, LSO  . 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 Breast reduction surgery Bilateral 10/03    Current Outpatient Prescriptions  Medication Sig Dispense Refill  . aspirin 325 MG EC tablet Take 325 mg by mouth daily.      Marland Kitchen atorvastatin (LIPITOR) 40 MG tablet Take 40 mg by mouth 2 (two) times daily.       . Calcium Carbonate (CALTRATE 600 PO) Take 1 tablet by mouth 2 (two) times daily.      . insulin detemir (LEVEMIR) 100 UNIT/ML injection Inject 20-25 Units into the skin 2 (two) times daily. 25 qam and 20 qhs      . meloxicam (MOBIC) 15 MG tablet Take 1 tablet by mouth daily as needed.      . metFORMIN (GLUCOPHAGE) 500 MG tablet Take 500 mg by mouth 2 (two) times daily with a meal.      . metoprolol (TOPROL-XL) 50 MG 24 hr tablet Take 50 mg by mouth daily.        Marland Kitchen  oxyCODONE-acetaminophen (PERCOCET/ROXICET) 5-325 MG per tablet Take 1-2 tablets by mouth every 4 (four) hours as needed for pain.  50 tablet  0  . valsartan (DIOVAN) 160 MG tablet Take 160 mg by mouth daily.       No current facility-administered medications for this visit.    Family History  Problem Relation Age of Onset  . Lung cancer Mother   . Colon cancer Father   . Stroke Brother   . Stroke Sister   . Pulmonary Hypertension Sister     ROS:  Pertinent items are noted in HPI.  Otherwise, a comprehensive ROS was negative.  Exam:   BP 140/82  Pulse 80  Resp 16  Ht 5' 3.25" (1.607 m)  Wt 227 lb (102.967 kg)  BMI 39.87 kg/m2 Height: 5' 3.25" (160.7 cm)  Ht Readings from Last 3 Encounters:  08/29/13 5' 3.25" (1.607 m)  07/27/13 5\' 4"  (1.626 m)  03/22/13 5\' 3"  (1.6 m)    General appearance: alert,  cooperative and appears stated age Head: Normocephalic, without obvious abnormality, atraumatic Neck: no adenopathy, supple, symmetrical, trachea midline and thyroid normal to inspection and palpation Lungs: clear to auscultation bilaterally Breasts: normal appearance, no masses or tenderness Heart: regular rate and rhythm Abdomen: soft, non-tender; no masses,  no organomegaly Extremities: extremities normal, atraumatic, no cyanosis or edema Skin: Skin color, texture, turgor normal. No rashes or lesions Lymph nodes: Cervical, supraclavicular, and axillary nodes normal. No abnormal inguinal nodes palpated Neurologic: Grossly normal   Pelvic: External genitalia:  no lesions              Urethra:  normal appearing urethra with no masses, tenderness or lesions              Bartholin's and Skene's: normal                 Vagina: normal appearing vagina with normal color and discharge, no lesions              Cervix: absent              Pap taken: yes Bimanual Exam:  Uterus:  uterus absent              Adnexa: no mass, fullness, tenderness               Rectovaginal: Confirms               Anus:  normal sphincter tone, no lesions  A:  Well Woman with normal exam  S/P TAH secondary to fibroids 5/94  S/P RSO, LOA peritoneal cyst 12/96  S/P Diag Lap with LOA, LSO 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

## 2013-08-30 LAB — VITAMIN D 25 HYDROXY (VIT D DEFICIENCY, FRACTURES): Vit D, 25-Hydroxy: 49 ng/mL (ref 30–89)

## 2013-08-30 NOTE — Progress Notes (Signed)
Encounter reviewed by Dr. Brook Silva.  

## 2013-09-01 LAB — IPS PAP TEST WITH REFLEX TO HPV

## 2014-04-26 ENCOUNTER — Other Ambulatory Visit: Payer: Self-pay

## 2014-07-27 ENCOUNTER — Ambulatory Visit: Payer: BC Managed Care – PPO | Admitting: Internal Medicine

## 2014-08-06 ENCOUNTER — Encounter: Payer: Self-pay | Admitting: Nurse Practitioner

## 2014-08-22 ENCOUNTER — Other Ambulatory Visit: Payer: Self-pay | Admitting: Obstetrics and Gynecology

## 2014-08-22 DIAGNOSIS — Z1231 Encounter for screening mammogram for malignant neoplasm of breast: Secondary | ICD-10-CM

## 2014-09-11 ENCOUNTER — Ambulatory Visit: Payer: BC Managed Care – PPO | Admitting: Nurse Practitioner

## 2014-10-11 ENCOUNTER — Ambulatory Visit (HOSPITAL_COMMUNITY): Payer: Self-pay

## 2014-10-11 ENCOUNTER — Ambulatory Visit (HOSPITAL_COMMUNITY): Payer: Self-pay | Attending: Obstetrics and Gynecology

## 2014-11-15 ENCOUNTER — Ambulatory Visit (HOSPITAL_COMMUNITY)
Admission: RE | Admit: 2014-11-15 | Discharge: 2014-11-15 | Disposition: A | Discharge status: Home / Self Care | Payer: Self-pay | Source: Ambulatory Visit | Attending: Obstetrics and Gynecology | Admitting: Obstetrics and Gynecology

## 2014-11-15 ENCOUNTER — Encounter (HOSPITAL_COMMUNITY): Payer: Self-pay

## 2014-11-15 VITALS — BP 132/80 | Temp 98.3°F | Ht 62.0 in | Wt 230.0 lb

## 2014-11-15 DIAGNOSIS — Z1239 Encounter for other screening for malignant neoplasm of breast: Secondary | ICD-10-CM

## 2014-11-15 DIAGNOSIS — Z1231 Encounter for screening mammogram for malignant neoplasm of breast: Secondary | ICD-10-CM

## 2014-11-15 NOTE — Addendum Note (Signed)
Encounter addended by: Loletta Parish, RN on: 11/15/2014  1:31 PM<BR>     Documentation filed: Notes Section

## 2014-11-15 NOTE — Patient Instructions (Signed)
Education materials on breast awareness given. Explained to [name] that she did not need a Pap smear today due to her history of a hysterectomy for benign reasons. Let patient know that she does not need any further Pap smears due to her history of a hysterectomy for benign reasons. Let patient know the [facility] will follow up with her within the next couple weeks with results by letter or phone. Kathy Reese verbalized understanding. Patient escorted to mammography for a screening mammogram.  [name], Arvil Chaco, RN 11:53 AM

## 2014-11-15 NOTE — Progress Notes (Addendum)
No complaints today.  Pap Smear: Pap smear not completed today. Last Pap smear was 08/29/2013 and normal Per patient has no history of an abnormal Pap smear. Patient has a history of a complete hysterectomy due to fibroids. Patient does not need any further Pap smears due to her history of a hysterectomy for benign reasons. Last Pap smear result is in EPIC.  Physical exam: Breasts Breasts symmetrical. Scars bilateral breasts from history of breast reduction. No nipple retraction bilateral breasts. No nipple discharge bilateral breasts. No lymphadenopathy. No lumps palpated bilateral breasts. No complaints of pain or tenderness on exam. Patient escorted to mammography for a screening mammogram.        Pelvic/Bimanual No Pap smear completed today since last Pap smear was 08/29/2013 and patient has a history of a hysterectomy for benign reasons. Pap smear not indicated per BCCCP guidelines.

## 2014-11-20 ENCOUNTER — Encounter (HOSPITAL_COMMUNITY): Payer: Self-pay | Admitting: Emergency Medicine

## 2014-11-20 ENCOUNTER — Emergency Department (HOSPITAL_COMMUNITY)
Admission: EM | Admit: 2014-11-20 | Discharge: 2014-11-20 | Disposition: A | Discharge status: Home / Self Care | Payer: No Typology Code available for payment source | Attending: Emergency Medicine | Admitting: Emergency Medicine

## 2014-11-20 DIAGNOSIS — Z9981 Dependence on supplemental oxygen: Secondary | ICD-10-CM | POA: Diagnosis not present

## 2014-11-20 DIAGNOSIS — Z7982 Long term (current) use of aspirin: Secondary | ICD-10-CM | POA: Diagnosis not present

## 2014-11-20 DIAGNOSIS — G4733 Obstructive sleep apnea (adult) (pediatric): Secondary | ICD-10-CM | POA: Diagnosis not present

## 2014-11-20 DIAGNOSIS — Z79899 Other long term (current) drug therapy: Secondary | ICD-10-CM | POA: Insufficient documentation

## 2014-11-20 DIAGNOSIS — I10 Essential (primary) hypertension: Secondary | ICD-10-CM | POA: Diagnosis not present

## 2014-11-20 DIAGNOSIS — E785 Hyperlipidemia, unspecified: Secondary | ICD-10-CM | POA: Diagnosis not present

## 2014-11-20 DIAGNOSIS — Y9389 Activity, other specified: Secondary | ICD-10-CM | POA: Insufficient documentation

## 2014-11-20 DIAGNOSIS — Z794 Long term (current) use of insulin: Secondary | ICD-10-CM | POA: Diagnosis not present

## 2014-11-20 DIAGNOSIS — Y92410 Unspecified street and highway as the place of occurrence of the external cause: Secondary | ICD-10-CM | POA: Diagnosis not present

## 2014-11-20 DIAGNOSIS — Y998 Other external cause status: Secondary | ICD-10-CM | POA: Insufficient documentation

## 2014-11-20 DIAGNOSIS — Z8679 Personal history of other diseases of the circulatory system: Secondary | ICD-10-CM

## 2014-11-20 DIAGNOSIS — M1990 Unspecified osteoarthritis, unspecified site: Secondary | ICD-10-CM | POA: Diagnosis not present

## 2014-11-20 DIAGNOSIS — Z8719 Personal history of other diseases of the digestive system: Secondary | ICD-10-CM | POA: Insufficient documentation

## 2014-11-20 DIAGNOSIS — Z8659 Personal history of other mental and behavioral disorders: Secondary | ICD-10-CM | POA: Diagnosis not present

## 2014-11-20 DIAGNOSIS — Z8543 Personal history of malignant neoplasm of ovary: Secondary | ICD-10-CM | POA: Diagnosis not present

## 2014-11-20 DIAGNOSIS — Z043 Encounter for examination and observation following other accident: Secondary | ICD-10-CM | POA: Insufficient documentation

## 2014-11-20 DIAGNOSIS — E119 Type 2 diabetes mellitus without complications: Secondary | ICD-10-CM | POA: Diagnosis not present
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
## 2014-11-20 NOTE — Discharge Instructions (Signed)
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
## 2014-11-20 NOTE — ED Notes (Signed)
Pt denies pain at this time

## 2015-01-17 ENCOUNTER — Ambulatory Visit: Payer: Self-pay | Admitting: Nurse Practitioner

## 2015-01-22 ENCOUNTER — Ambulatory Visit: Payer: Self-pay | Admitting: Nurse Practitioner

## 2015-03-06 ENCOUNTER — Encounter: Payer: Self-pay | Admitting: Nurse Practitioner

## 2015-03-06 ENCOUNTER — Ambulatory Visit (INDEPENDENT_AMBULATORY_CARE_PROVIDER_SITE_OTHER): Payer: Self-pay | Admitting: Nurse Practitioner

## 2015-03-06 VITALS — BP 116/68 | HR 68 | Ht 63.0 in | Wt 228.0 lb

## 2015-03-06 DIAGNOSIS — Z8543 Personal history of malignant neoplasm of ovary: Secondary | ICD-10-CM

## 2015-03-06 DIAGNOSIS — Z01419 Encounter for gynecological examination (general) (routine) without abnormal findings: Secondary | ICD-10-CM

## 2015-03-06 DIAGNOSIS — T8351XD Infection and inflammatory reaction due to indwelling urinary catheter, subsequent encounter: Secondary | ICD-10-CM

## 2015-03-06 DIAGNOSIS — N390 Urinary tract infection, site not specified: Secondary | ICD-10-CM

## 2015-03-06 DIAGNOSIS — Z0000 Encounter for general adult medical examination without abnormal findings: Secondary | ICD-10-CM

## 2015-03-06 DIAGNOSIS — T83511D Infection and inflammatory reaction due to indwelling urethral catheter, subsequent encounter: Secondary | ICD-10-CM

## 2015-03-06 LAB — POCT URINALYSIS DIPSTICK
Blood, UA: NEGATIVE
Glucose, UA: NEGATIVE
Ketones, UA: NEGATIVE
Leukocytes, UA: NEGATIVE
Nitrite, UA: NEGATIVE
Urobilinogen, UA: 1
pH, UA: 5

## 2015-03-06 NOTE — Patient Instructions (Addendum)

## 2015-03-06 NOTE — Progress Notes (Addendum)
Patient ID: Kathy Reese, female   DOB: December 17, 1950, 64 y.o.   MRN: [id number] 64 y.o. [id number] Married  African American Fe here for annual exam.  Now retired and will go on [employer] next year.  UTI recently at PCP and wants to get TOC while here today.  Patient's last menstrual period was 02/02/1993.          Sexually active: No.  The current method of family planning is post hysterectomy  Exercising: Yes.    walking X5 Smoker:  no  Health Maintenance: Pap:  Hysterectomy secondary to fibroids, but history of low grade OV cancer MMG: 11/15/2014 Bi RADS Negative Colonoscopy:  03-22-2013  No abnormalities BMD:   04-14-2006  T Score: S,0.2/L hip neck,1.1 TDaP: 05/1994 Labs: PCP CA 125 yearly here   reports that she has never smoked. She has never used smokeless tobacco. She reports that she does not drink alcohol or use illicit drugs.  Past Medical History  Diagnosis Date  . Allergic rhinitis, cause unspecified   . Type II or unspecified type diabetes mellitus without mention of complication, not stated as uncontrolled   . Other and unspecified hyperlipidemia   . Unspecified essential hypertension   . Hypersomnia   . PONV (postoperative nausea and vomiting)   . OSA on CPAP     ? what yr or exactly when  . Diverticulosis   . Arthritis   . Family history of anesthesia complication     NIECE HAD DIFFICULTY WAKING UP  . Depression   . Personal history of malignant neoplasm of ovary     Past Surgical History  Procedure Laterality Date  . Tonsillectomy    . Total abdominal hysterectomy w/ bilateral salpingoophorectomy  5/94    secondary to uterine fibroids  . Trigger finger release  07/06/2012    Procedure: RELEASE TRIGGER FINGER/A-1 PULLEY;  Surgeon: Alta Corning, MD;  Location: [facility];  Service: Orthopedics;  Laterality: Left;  left ring finger  . Eye surgery Left     cataracts  . Total knee arthroplasty Right 05/12/2013    Procedure: TOTAL KNEE ARTHROPLASTY;   Surgeon: Alta Corning, MD;  Location: [facility];  Service: Orthopedics;  Laterality: Right;  . Tubal ligation    . Laparoscopic salpingo oopherectomy Right 12/96  . Abdominal adhesion surgery  12/96    exc peritoneal cyst  . Breast biopsy Left 2000    stereotactic, negative  . Laparoscopy abdomen diagnostic  2/08    w/LOA, LSO  . 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 Breast reduction surgery Bilateral 10/03    Current Outpatient Prescriptions  Medication Sig Dispense Refill  . aspirin 325 MG EC tablet Take 81 mg by mouth daily.     Marland Kitchen atorvastatin (LIPITOR) 40 MG tablet Take 40 mg by mouth every morning.     . Calcium Carbonate (CALTRATE 600 PO) Take 1 tablet by mouth 2 (two) times daily.    . insulin detemir (LEVEMIR) 100 UNIT/ML injection Inject 20-25 Units into the skin 2 (two) times daily. 25 qam and 30 qhs    . meloxicam (MOBIC) 15 MG tablet Take 1 tablet by mouth daily as needed.    . metFORMIN (GLUCOPHAGE) 500 MG tablet Take 500 mg by mouth every morning.     . metoprolol (TOPROL-XL) 50 MG 24 hr tablet Take 50 mg by mouth daily.      Marland Kitchen oxyCODONE-acetaminophen (PERCOCET/ROXICET)  5-325 MG per tablet Take 1-2 tablets by mouth every 4 (four) hours as needed for pain. 50 tablet 0  . valsartan (DIOVAN) 160 MG tablet Take 160 mg by mouth daily.     No current facility-administered medications for this visit.    Family History  Problem Relation Age of Onset  . Lung cancer Mother   . Colon cancer Father   . Stroke Brother   . Stroke Sister   . Pulmonary Hypertension Sister     ROS:  Pertinent items are noted in HPI.  Otherwise, a comprehensive ROS was negative.  Exam:   BP 116/68 mmHg  Pulse 68  Ht 5\' 3"  (1.6 m)  Wt 228 lb (103.42 kg)  BMI 40.40 kg/m2  LMP 02/02/1993 Height: 5\' 3"  (160 cm) Ht Readings from Last 3 Encounters:  03/06/15 5\' 3"  (1.6 m)  11/20/14 5\' 2"  (1.575 m)  11/15/14 5\' 2"  (1.575 m)    General  appearance: alert, cooperative and appears stated age Head: Normocephalic, without obvious abnormality, atraumatic Neck: no adenopathy, supple, symmetrical, trachea midline and thyroid normal to inspection and palpation Lungs: clear to auscultation bilaterally Breasts: normal appearance, no masses or tenderness Heart: regular rate and rhythm Abdomen: soft, non-tender; no masses,  no organomegaly Extremities: extremities normal, atraumatic, no cyanosis or edema Skin: Skin color, texture, turgor normal. No rashes or lesions Lymph nodes: Cervical, supraclavicular, and axillary nodes normal. No abnormal inguinal nodes palpated Neurologic: Grossly normal   Pelvic: External genitalia:  no lesions              Urethra:  normal appearing urethra with no masses, tenderness or lesions              Bartholin's and Skene's: normal                 Vagina: normal appearing vagina with normal color and discharge, no lesions              Cervix: absent              Pap taken: Yes.   Bimanual Exam:  Uterus:  uterus absent              Adnexa: no mass, fullness, tenderness               Rectovaginal: Confirms               Anus:  normal sphincter tone, no lesions  Chaperone present: No  A:  Well Woman with normal exam  S/P TAH secondary to fibroids 5/94 S/P RSO, LOA peritoneal cyst 12/96 S/P Diag Lap with LOA, LSO state 1C low malignancy tumor of ovary 12/03/06 Vit D deficiency, DM, HTN  P:   Reviewed health and wellness pertinent to exam  Pap smear as above  Mammogram is due 11/2015  Follow with CA 125  Counseled on breast self exam, mammography screening, adequate intake of calcium and vitamin D, diet and exercise return annually or prn  An After Visit Summary was printed and given to the patient.

## 2015-03-07 LAB — CA 125: CA 125: 4 U/mL (ref <35)

## 2015-03-08 LAB — URINE CULTURE
Colony Count: NO GROWTH
Organism ID, Bacteria: NO GROWTH

## 2015-03-10 NOTE — Progress Notes (Signed)
Encounter reviewed by Dr. Brook Silva.  

## 2015-03-13 ENCOUNTER — Telehealth: Payer: Self-pay | Admitting: *Deleted

## 2015-03-13 NOTE — Telephone Encounter (Signed)
   Notes Recorded by Elroy Channel, CMA on 03/13/2015 at 1:57 PM LM for pt to call back. Notes Recorded by Kem Boroughs, FNP on 03/12/2015 at 5:38 PM Note for follow up to pt about CA 125 - may need to call her about this as well.  Kathy Reese, I was able to discuss the CA 125 test with Dr. Sabra Heck and she said to continue having this test done yearly.  Will repeat next year.

## 2015-03-13 NOTE — Telephone Encounter (Signed)
Patient states she calling back for results wanting to speak with [facility]. Patient ok for call back

## 2015-03-13 NOTE — Telephone Encounter (Signed)
Spoke with patient. Advised of message as seen below from Milford Cage, [place]. Patient is agreeable and verbalizes understanding.  Routing to provider for final review. Patient agreeable to disposition. Will close encounter.

## 2015-04-01 ENCOUNTER — Other Ambulatory Visit: Payer: Self-pay

## 2015-12-18 ENCOUNTER — Other Ambulatory Visit: Payer: Self-pay | Admitting: Nurse Practitioner

## 2015-12-18 DIAGNOSIS — Z1231 Encounter for screening mammogram for malignant neoplasm of breast: Secondary | ICD-10-CM

## 2015-12-19 ENCOUNTER — Ambulatory Visit
Admission: RE | Admit: 2015-12-19 | Discharge: 2015-12-19 | Disposition: A | Discharge status: Home / Self Care | Payer: No Typology Code available for payment source | Source: Ambulatory Visit | Attending: Nurse Practitioner | Admitting: Nurse Practitioner

## 2015-12-19 DIAGNOSIS — Z1231 Encounter for screening mammogram for malignant neoplasm of breast: Secondary | ICD-10-CM

## 2016-09-09 ENCOUNTER — Encounter: Payer: Self-pay | Admitting: Nurse Practitioner

## 2016-09-09 ENCOUNTER — Ambulatory Visit (INDEPENDENT_AMBULATORY_CARE_PROVIDER_SITE_OTHER): Payer: Medicare Other | Admitting: Nurse Practitioner

## 2016-09-09 VITALS — BP 122/80 | HR 60 | Ht 63.0 in | Wt 225.0 lb

## 2016-09-09 DIAGNOSIS — Z01419 Encounter for gynecological examination (general) (routine) without abnormal findings: Secondary | ICD-10-CM | POA: Diagnosis not present

## 2016-09-09 DIAGNOSIS — Z0000 Encounter for general adult medical examination without abnormal findings: Secondary | ICD-10-CM | POA: Diagnosis not present

## 2016-09-09 DIAGNOSIS — Z8543 Personal history of malignant neoplasm of ovary: Secondary | ICD-10-CM

## 2016-09-09 NOTE — Progress Notes (Signed)
Patient ID: Kathy Reese, female   DOB: [birth year], 65 y.o.   MRN: [id number]  65 y.o. [id number] Married  African American Fe here for annual exam.  Doing well.  Now on Medicare.  She is loving retirement and has been on several trips with friends.  Upset that her husband has been diagnosed with mild dementia.  Patient's last menstrual period was 02/02/1993.          Sexually active: No.  The current method of family planning is abstinence.    Exercising: Yes.    Gym/ health club routine includes aerobics, water aerobics or exercise machines every day. Smoker:  no  Health Maintenance: Pap:  08/29/13, Negative MMG:  12/19/15, Bi-Rads 1:  Negative Colonoscopy:  03/22/13, Normal, repeat in 10 years BMD: 04/14/06 T Score: 0.2 Spine / 1.1 Left Femur Neck TDaP: 1995 Shingles: Never - will get at pharmacy Pneumonia: Never Hep C and HIV:  discuss today and will do at PCP Labs: PCP takes care of labs, pt has CA 125 yearly   reports that she has never smoked. She has never used smokeless tobacco. She reports that she does not drink alcohol or use drugs.  Past Medical History:  Diagnosis Date  . Allergic rhinitis, cause unspecified   . Arthritis   . Depression   . Diverticulosis   . Family history of anesthesia complication    NIECE HAD DIFFICULTY WAKING UP  . Hypersomnia   . OSA on CPAP    ? what yr or exactly when  . Other and unspecified hyperlipidemia   . Personal history of malignant neoplasm of ovary   . PONV (postoperative nausea and vomiting)   . Type II or unspecified type diabetes mellitus without mention of complication, not stated as uncontrolled   . Unspecified essential hypertension     Past Surgical History:  Procedure Laterality Date  . ABDOMINAL ADHESION SURGERY  12/96   exc peritoneal cyst  . BREAST BIOPSY Left 2000   stereotactic, negative  . BREAST REDUCTION SURGERY Bilateral 10/03  . EYE SURGERY Left    cataracts  . LAPAROSCOPIC SALPINGO OOPHERECTOMY Right 12/96   . 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 LAPAROSCOPY ABDOMEN DIAGNOSTIC  2/08   w/LOA, LSO  . TONSILLECTOMY    . TOTAL ABDOMINAL HYSTERECTOMY W/ BILATERAL SALPINGOOPHORECTOMY  5/94   secondary to uterine fibroids  . TOTAL KNEE ARTHROPLASTY Right 05/12/2013   Procedure: TOTAL KNEE ARTHROPLASTY;  Surgeon: Alta Corning, MD;  Location: [facility];  Service: Orthopedics;  Laterality: Right;  . TRIGGER FINGER RELEASE  07/06/2012   Procedure: RELEASE TRIGGER FINGER/A-1 PULLEY;  Surgeon: Alta Corning, MD;  Location: [facility];  Service: Orthopedics;  Laterality: Left;  left ring finger  . TUBAL LIGATION      Current Outpatient Prescriptions  Medication Sig Dispense Refill  . aspirin 325 MG EC tablet Take 81 mg by mouth daily.     Marland Kitchen atorvastatin (LIPITOR) 40 MG tablet Take 40 mg by mouth every morning.     . Calcium Carbonate (CALTRATE 600 PO) Take 1 tablet by mouth 2 (two) times daily.    . insulin detemir (LEVEMIR) 100 UNIT/ML injection Inject 20-25 Units into the skin 2 (two) times daily. 25 qam and 30 qhs    . meloxicam (MOBIC) 15 MG tablet Take 1 tablet by mouth daily as needed.    . metFORMIN (GLUCOPHAGE) 500 MG tablet Take  500 mg by mouth every morning.     . metoprolol (TOPROL-XL) 50 MG 24 hr tablet Take 50 mg by mouth daily.      Marland Kitchen oxyCODONE-acetaminophen (PERCOCET/ROXICET) 5-325 MG per tablet Take 1-2 tablets by mouth every 4 (four) hours as needed for pain. 50 tablet 0  . valsartan (DIOVAN) 160 MG tablet Take 160 mg by mouth daily.     No current facility-administered medications for this visit.     Family History  Problem Relation Age of Onset  . Lung cancer Mother   . Colon cancer Father   . Stroke Brother   . Stroke Sister   . Pulmonary Hypertension Sister     ROS:  Pertinent items are noted in HPI.  Otherwise, a comprehensive ROS was negative.  Exam:   LMP 02/02/1993    Ht Readings from Last 3 Encounters:   03/06/15 5\' 3"  (1.6 m)  11/20/14 5\' 2"  (1.575 m)  11/15/14 5\' 2"  (1.575 m)    General appearance: alert, cooperative and appears stated age Head: Normocephalic, without obvious abnormality, atraumatic Neck: no adenopathy, supple, symmetrical, trachea midline and thyroid normal to inspection and palpation Lungs: clear to auscultation bilaterally Breasts: normal appearance, no masses or tenderness Heart: regular rate and rhythm Abdomen: soft, non-tender; no masses,  no organomegaly Extremities: extremities normal, atraumatic, no cyanosis or edema Skin: Skin color, texture, turgor normal. No rashes or lesions Lymph nodes: Cervical, supraclavicular, and axillary nodes normal. No abnormal inguinal nodes palpated Neurologic: Grossly normal   Pelvic: External genitalia:  no lesions              Urethra:  normal appearing urethra with no masses, tenderness or lesions              Bartholin's and Skene's: normal                 Vagina: normal appearing vagina with normal color and discharge, no lesions              Cervix: absent              Pap taken: No. Bimanual Exam:  Uterus:  uterus absent              Adnexa: no mass, fullness, tenderness               Rectovaginal: Confirms               Anus:  normal sphincter tone, no lesions  Chaperone present: yes  A:  Well Woman with normal exam     S/P TAH secondary to fibroids 5/94 S/P RSO, LOA peritoneal cyst 12/96 S/P Diag Lap with LOA, LSO state 1C low malignancy tumor of ovary 12/03/06 Vit D deficiency, DM, HTN    P:   Reviewed health and wellness pertinent to exam  Pap smear as above  Mammogram is due 12/2016  Will follow with labs   She is given a list of immunizations she needs to get updated  Counseled on breast self exam, mammography screening, adequate intake of calcium and vitamin D, diet and exercise return annually or prn  An After Visit Summary was printed and given to the  patient.

## 2016-09-09 NOTE — Patient Instructions (Addendum)

## 2016-09-10 LAB — CA 125: CA 125: 3 U/mL (ref <35)

## 2016-09-11 NOTE — Progress Notes (Signed)
Encounter reviewed by Dr. Nada Godley Amundson C. Silva.  

## 2016-10-22 ENCOUNTER — Ambulatory Visit: Payer: Medicare Other | Admitting: Podiatry

## 2016-10-29 ENCOUNTER — Ambulatory Visit: Payer: Medicare Other | Admitting: Podiatry

## 2016-11-04 ENCOUNTER — Encounter: Payer: Self-pay | Admitting: Podiatry

## 2016-11-04 ENCOUNTER — Ambulatory Visit (INDEPENDENT_AMBULATORY_CARE_PROVIDER_SITE_OTHER): Payer: Medicare Other | Admitting: Podiatry

## 2016-11-04 VITALS — BP 133/72 | HR 53 | Resp 16 | Ht 63.0 in | Wt 219.0 lb

## 2016-11-04 DIAGNOSIS — M79605 Pain in left leg: Secondary | ICD-10-CM

## 2016-11-04 DIAGNOSIS — M79604 Pain in right leg: Secondary | ICD-10-CM | POA: Diagnosis not present

## 2016-11-04 DIAGNOSIS — B351 Tinea unguium: Secondary | ICD-10-CM

## 2016-11-04 NOTE — Progress Notes (Signed)
   Subjective:    Patient ID: Kathy Reese, female    DOB: [birth year], 66 y.o.   MRN: [id number]  HPI Chief Complaint  Patient presents with  . Diabetic Foot Check    Pt stated,"Feet do not hurt; wants to get a diabetic foot check today"; Pt Diabetic Type 2; Sugar=106 this am; A1C=6.0      Review of Systems  Constitutional: Positive for activity change.  All other systems reviewed and are negative.      Objective:   Physical Exam        Assessment & Plan:

## 2016-11-05 NOTE — Progress Notes (Signed)
Subjective:     Patient ID: Kathy Reese, female   DOB: Jan 01, 1951, 66 y.o.   MRN: [id number]  HPI patient presents stating that her arches aren't good but she's had trouble with toenails and she does have diabetes   Review of Systems  All other systems reviewed and are negative.      Objective:   Physical Exam  Constitutional: She is oriented to person, place, and time.  Cardiovascular: Intact distal pulses.   Musculoskeletal: Normal range of motion.  Neurological: She is oriented to person, place, and time.  Skin: Skin is warm and dry.  Nursing note and vitals reviewed.  neurovascular status was found to be intact with normal sharp Dole vibratory with patient found to have dry skin and thickened nails 1-5 both feet with incurvation of the beds and mild to moderate discomfort with deep palpation. Patient does have slight redness in the corners but not significant and is found to have good digital perfusion and well oriented 3     Assessment:     Diabetic with moderately painful nailbeds 1-5 both feet    Plan:     H&P diabetic education rendered and today debridement of nailbeds 1-5 both feet accomplished and explained daily inspections due to her diabetes. Patient will be seen back for [place] to recheck as needed and can't cut these straight across on her own

## 2016-12-23 ENCOUNTER — Other Ambulatory Visit: Payer: Self-pay | Admitting: Nurse Practitioner

## 2016-12-23 DIAGNOSIS — Z1231 Encounter for screening mammogram for malignant neoplasm of breast: Secondary | ICD-10-CM

## 2016-12-24 ENCOUNTER — Other Ambulatory Visit: Payer: Self-pay | Admitting: Internal Medicine

## 2016-12-24 DIAGNOSIS — E2839 Other primary ovarian failure: Secondary | ICD-10-CM

## 2017-01-13 ENCOUNTER — Ambulatory Visit
Admission: RE | Admit: 2017-01-13 | Discharge: 2017-01-13 | Disposition: A | Discharge status: Home / Self Care | Payer: Medicare Other | Source: Ambulatory Visit | Attending: Nurse Practitioner | Admitting: Nurse Practitioner

## 2017-01-13 ENCOUNTER — Ambulatory Visit
Admission: RE | Admit: 2017-01-13 | Discharge: 2017-01-13 | Disposition: A | Discharge status: Home / Self Care | Payer: Medicare Other | Source: Ambulatory Visit | Attending: Internal Medicine | Admitting: Internal Medicine

## 2017-01-13 DIAGNOSIS — Z1231 Encounter for screening mammogram for malignant neoplasm of breast: Secondary | ICD-10-CM

## 2017-01-13 DIAGNOSIS — E2839 Other primary ovarian failure: Secondary | ICD-10-CM

## 2017-01-29 ENCOUNTER — Ambulatory Visit: Payer: Medicare Other | Admitting: Podiatry

## 2017-02-05 ENCOUNTER — Encounter: Payer: Self-pay | Admitting: Podiatry

## 2017-02-05 ENCOUNTER — Ambulatory Visit (INDEPENDENT_AMBULATORY_CARE_PROVIDER_SITE_OTHER): Payer: Medicare Other | Admitting: Podiatry

## 2017-02-05 DIAGNOSIS — M79604 Pain in right leg: Secondary | ICD-10-CM

## 2017-02-05 DIAGNOSIS — M79605 Pain in left leg: Secondary | ICD-10-CM | POA: Diagnosis not present

## 2017-02-05 DIAGNOSIS — B351 Tinea unguium: Secondary | ICD-10-CM | POA: Diagnosis not present

## 2017-02-06 NOTE — Progress Notes (Signed)
Subjective:    Patient ID: Kathy Reese, female   DOB: 66 y.o.   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

## 2017-03-10 ENCOUNTER — Ambulatory Visit: Payer: Medicare Other

## 2017-03-10 ENCOUNTER — Encounter: Payer: Self-pay | Admitting: Podiatry

## 2017-03-10 ENCOUNTER — Ambulatory Visit (INDEPENDENT_AMBULATORY_CARE_PROVIDER_SITE_OTHER): Payer: Medicare Other | Admitting: Podiatry

## 2017-03-10 DIAGNOSIS — S90122A Contusion of left lesser toe(s) without damage to nail, initial encounter: Secondary | ICD-10-CM

## 2017-03-10 DIAGNOSIS — B351 Tinea unguium: Secondary | ICD-10-CM

## 2017-03-12 NOTE — Progress Notes (Signed)
Subjective:    Patient ID: Kathy Reese, female   DOB: 66 y.o.   MRN: [id number]   HPI patient presents stating she bruised her left fifth toe and she is worried that could be fracture    ROS      Objective:  Physical Exam neurovascular status intact with patient found to have a mild swelling the left fifth digit with rotational issues     Assessment:    Possibility for fracture left fifth digit     Plan:    X-ray reviewed and advised this patient on wider shoes and just to take an eye on it. Patient be seen back as needed  X-rays indicate no signs of fracture appears to be soft tissue contusion

## 2017-03-29 ENCOUNTER — Telehealth: Payer: Self-pay | Admitting: Podiatry

## 2017-03-29 NOTE — Telephone Encounter (Signed)
Pt states her diabetic doctor wanted her to have a looser shoe to wear since the toe is still swollen and tender. Pt states it is her little toe. I offered her an darco surgery shoe and told her it was $35.00 and we would file for insurance and what was not covered she would be billed for. Pt states she will come by tomorrow to pick up.

## 2017-03-29 NOTE — Telephone Encounter (Signed)
Pt was seen by another physician for her left great toe. Stated it is painful and swollen. Stated she was told to wear a non-closed shoe and wants to know if there is anything we would recommend as it hurts to wear a shoe and walk. Stated she saw Dr. Paulla Dolly last week but nothing much was said.

## 2017-05-11 ENCOUNTER — Other Ambulatory Visit: Payer: Medicare Other

## 2017-06-17 ENCOUNTER — Encounter: Payer: Self-pay | Admitting: Podiatry

## 2017-06-17 ENCOUNTER — Ambulatory Visit (INDEPENDENT_AMBULATORY_CARE_PROVIDER_SITE_OTHER): Payer: Medicare Other | Admitting: Podiatry

## 2017-06-17 DIAGNOSIS — E1140 Type 2 diabetes mellitus with diabetic neuropathy, unspecified: Secondary | ICD-10-CM | POA: Diagnosis not present

## 2017-06-17 DIAGNOSIS — M79675 Pain in left toe(s): Secondary | ICD-10-CM | POA: Diagnosis not present

## 2017-06-17 DIAGNOSIS — E1149 Type 2 diabetes mellitus with other diabetic neurological complication: Secondary | ICD-10-CM

## 2017-06-17 DIAGNOSIS — B351 Tinea unguium: Secondary | ICD-10-CM

## 2017-06-17 DIAGNOSIS — M79674 Pain in right toe(s): Secondary | ICD-10-CM | POA: Diagnosis not present

## 2017-06-17 NOTE — Progress Notes (Signed)
Subjective:    Patient ID: Kathy Reese, female   DOB: 66 y.o.   MRN: [id number]   HPI patient presents with thick yellow brittle nailbeds 1-5 both feet that she cannot cut with long-term diabetes    ROS      Objective:  Physical Exam neurovascular status intact with thick yellow brittle nailbeds 1-5 both feet that are painful     Assessment:    Mycotic nail infection with pain 1-5 both feet     Plan:    Debris painful nailbeds 1-5 both feet with no iatrogenic bleeding noted

## 2017-06-22 ENCOUNTER — Telehealth: Payer: Self-pay | Admitting: Obstetrics and Gynecology

## 2017-06-22 NOTE — Telephone Encounter (Signed)
Message forwarded to Dr. Talbert Nan.  Cc- Dr. Talbert Nan.

## 2017-06-22 NOTE — Telephone Encounter (Signed)
Patient called requesting to speak with the nurse about recent "excessive sweating."

## 2017-06-22 NOTE — Telephone Encounter (Signed)
Spoke with patient. Patient has had a hysterectomy. Patient states that a couple of weeks ago she began having increased hot flashes. Reports she will randomly have episodes of being really hot and sweaty and then will cool off. Impacting her daily life due to sweating. Advised will need to be seen for further evaluation. Patient is agreeable. Appointment scheduled for 06/24/2017 at 10 am with Dr.Jertson. Patient is agreeable to date and time.  Routing to provider for final review. Patient agreeable to disposition. Will close encounter.

## 2017-06-24 ENCOUNTER — Encounter: Payer: Self-pay | Admitting: Obstetrics and Gynecology

## 2017-06-24 ENCOUNTER — Ambulatory Visit (INDEPENDENT_AMBULATORY_CARE_PROVIDER_SITE_OTHER): Payer: Medicare Other | Admitting: Obstetrics and Gynecology

## 2017-06-24 VITALS — BP 138/80 | HR 60 | Resp 16 | Wt 224.0 lb

## 2017-06-24 DIAGNOSIS — G479 Sleep disorder, unspecified: Secondary | ICD-10-CM | POA: Diagnosis not present

## 2017-06-24 DIAGNOSIS — R61 Generalized hyperhidrosis: Secondary | ICD-10-CM | POA: Diagnosis not present

## 2017-06-24 DIAGNOSIS — R351 Nocturia: Secondary | ICD-10-CM

## 2017-06-24 DIAGNOSIS — R232 Flushing: Secondary | ICD-10-CM | POA: Diagnosis not present

## 2017-06-24 NOTE — Progress Notes (Signed)
GYNECOLOGY  VISIT   HPI: 66 y.o.   Married  [place] American  female   [id number] with Patient's last menstrual period was 02/02/1993 (exact date).   here c/o hot flashes X 3 weeks. She just gets hot, then cools off. She has been having sweats at night, just in the last few weeks.  She has had a hysterectomy and BSO. She never had vasomotor symptoms when her ovaries were removed. Her husband has dementia, he is up at night and walks through the house. It wakes her up. Not sleeping well. She is also waking up because she is sweating and has to void. She does gets up 2-3 x at night to void.  She drinks coffee in the morning. She drinks decaffeinated. Drinks lots of water during the day and evening. She is diabetic, her FS have been excellent, primary keeps decreasing her medication. Her endocrinologist see's her every 6 months. Due for blood work in 11/18.  She has 3 kids, 2 grand kids (local)  GYNECOLOGIC HISTORY: Patient's last menstrual period was 02/02/1993 (exact date). 5Contraception:hysterectomy  Menopausal hormone therapy: none         OB History    Gravida Para Term Preterm AB Living   4 4 3 1  0 3   SAB TAB Ectopic Multiple Live Births   0 0 0 0 4         Patient Active Problem List   Diagnosis Date Noted  . Osteoarthritis of right knee 05/12/2013  . HYPERLIPIDEMIA 12/30/2007  . HYPERTENSION 12/30/2007  . INSOMNIA 12/30/2007  . HYPERSOMNIA 12/30/2007  . DM w/o Complication Type II 23/55/7322  . HYPERCHOLESTEROLEMIA 12/16/2007  . EXOGENOUS OBESITY 12/16/2007  . SLEEP APNEA, OBSTRUCTIVE 12/16/2007  . ALLERGIC RHINITIS 12/16/2007    Past Medical History:  Diagnosis Date  . Allergic rhinitis, cause unspecified   . Arthritis   . Depression   . Diverticulosis   . Family history of anesthesia complication    NIECE HAD DIFFICULTY WAKING UP  . Hypersomnia   . OSA on CPAP    ? what yr or exactly when  . Other and unspecified hyperlipidemia   . Personal history of  malignant neoplasm of ovary   . PONV (postoperative nausea and vomiting)   . Type II or unspecified type diabetes mellitus without mention of complication, not stated as uncontrolled   . Unspecified essential hypertension     Past Surgical History:  Procedure Laterality Date  . ABDOMINAL ADHESION SURGERY  12/96   exc peritoneal cyst  . BREAST BIOPSY Left 2000   stereotactic, negative  . BREAST REDUCTION SURGERY Bilateral 10/03  . EYE SURGERY Left    cataracts  . LAPAROSCOPIC SALPINGO OOPHERECTOMY Right 12/96  . 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 LAPAROSCOPY ABDOMEN DIAGNOSTIC  2/08   w/LOA, LSO  . REDUCTION MAMMAPLASTY Bilateral 2004  . TONSILLECTOMY    . TOTAL ABDOMINAL HYSTERECTOMY W/ BILATERAL SALPINGOOPHORECTOMY  5/94   secondary to uterine fibroids  . TOTAL KNEE ARTHROPLASTY Right 05/12/2013   Procedure: TOTAL KNEE ARTHROPLASTY;  Surgeon: Alta Corning, MD;  Location: [facility];  Service: Orthopedics;  Laterality: Right;  . TRIGGER FINGER RELEASE  07/06/2012   Procedure: RELEASE TRIGGER FINGER/A-1 PULLEY;  Surgeon: Alta Corning, MD;  Location: [facility];  Service: Orthopedics;  Laterality: Left;  left ring finger  . TUBAL LIGATION      Current Outpatient Prescriptions  Medication  Sig Dispense Refill  . aspirin 325 MG EC tablet Take 81 mg by mouth daily.     Marland Kitchen atorvastatin (LIPITOR) 40 MG tablet Take 40 mg by mouth every morning.     . Calcium Carbonate (CALTRATE 600 PO) Take 1 tablet by mouth 2 (two) times daily.    . insulin detemir (LEVEMIR) 100 UNIT/ML injection Inject 20-25 Units into the skin 2 (two) times daily. 25 qam and 30 qhs    . metFORMIN (GLUCOPHAGE) 500 MG tablet Take 500 mg by mouth every morning.     . metoprolol (TOPROL-XL) 50 MG 24 hr tablet Take 50 mg by mouth daily.      . valsartan (DIOVAN) 160 MG tablet Take 160 mg by mouth daily.     No current facility-administered medications for  this visit.      ALLERGIES: Patient has no known allergies.  Family History  Problem Relation Age of Onset  . Lung cancer Mother   . Colon cancer Father   . Stroke Brother   . Stroke Sister   . Pulmonary Hypertension Sister     Social History   Social History  . Marital status: Married    Spouse name: N/A  . Number of children: 3  . Years of education: N/A   Occupational History  . CUSTOMER [employer]   Social History Main Topics  . Smoking status: Never Smoker  . Smokeless tobacco: Never Used  . Alcohol use No  . Drug use: No  . Sexual activity: No   Other Topics Concern  . Not on file   Social History Narrative  . No narrative on file    Review of Systems  Constitutional:       Hot flashes   HENT: Negative.   Eyes: Negative.   Respiratory: Negative.   Cardiovascular: Negative.   Gastrointestinal: Negative.   Genitourinary: Negative.   Musculoskeletal: Negative.   Skin: Negative.   Neurological: Negative.   Endo/Heme/Allergies: Negative.   Psychiatric/Behavioral: Negative.     PHYSICAL EXAMINATION:    BP 138/80 (BP Location: Right Arm, Patient Position: Sitting, Cuff Size: Large)   Pulse 60   Resp 16   Wt 224 lb (101.6 kg)   LMP 02/02/1993 (Exact Date)   BMI 39.68 kg/m     General appearance: alert, cooperative and appears stated age Neck: no adenopathy, supple, symmetrical, trachea midline and thyroid normal to inspection and palpation  Heart: regular rate and rhythm Lungs: CTAB Neurologic: grossly normal   ASSESSMENT Hot flashes, night sweats. Symptoms just started in the last few weeks, she had her ovaries removed years ago. Not c/w menopausal vasomotor symptoms Nocturia (normal amounts, drinks lots of water) Sleep disturbance    PLAN CBC, TSH Discussed avoiding triggers, behavioral changes for handling her vasomotor symptoms Discussed fluid intake, particularly in the evening   An After Visit Summary was printed and  given to the patient.  ~15 minutes face to face time of which over 50% was spent in counseling.

## 2017-06-25 LAB — CBC WITH DIFFERENTIAL/PLATELET
Basophils Absolute: 0 10*3/uL (ref 0.0–0.2)
Basos: 0 %
EOS (ABSOLUTE): 0.2 10*3/uL (ref 0.0–0.4)
Eos: 2 %
Hematocrit: 39.1 % (ref 34.0–46.6)
Hemoglobin: 13.1 g/dL (ref 11.1–15.9)
Immature Grans (Abs): 0 10*3/uL (ref 0.0–0.1)
Immature Granulocytes: 0 %
Lymphocytes Absolute: 3 10*3/uL (ref 0.7–3.1)
Lymphs: 40 %
MCH: 29.1 pg (ref 26.6–33.0)
MCHC: 33.5 g/dL (ref 31.5–35.7)
MCV: 87 fL (ref 79–97)
Monocytes Absolute: 0.5 10*3/uL (ref 0.1–0.9)
Monocytes: 7 %
Neutrophils Absolute: 3.7 10*3/uL (ref 1.4–7.0)
Neutrophils: 51 %
Platelets: 205 10*3/uL (ref 150–379)
RBC: 4.5 x10E6/uL (ref 3.77–5.28)
RDW: 14.7 % (ref 12.3–15.4)
WBC: 7.4 10*3/uL (ref 3.4–10.8)

## 2017-06-25 LAB — TSH: TSH: 1.71 u[IU]/mL (ref 0.450–4.500)

## 2017-07-14 ENCOUNTER — Institutional Professional Consult (permissible substitution): Payer: Medicare Other | Admitting: Internal Medicine

## 2017-09-13 ENCOUNTER — Ambulatory Visit: Payer: Medicare Other | Admitting: Nurse Practitioner

## 2017-09-16 ENCOUNTER — Encounter: Payer: Self-pay | Admitting: Obstetrics and Gynecology

## 2017-09-16 ENCOUNTER — Other Ambulatory Visit: Payer: Self-pay

## 2017-09-16 ENCOUNTER — Ambulatory Visit (INDEPENDENT_AMBULATORY_CARE_PROVIDER_SITE_OTHER): Payer: Medicare Other | Admitting: Obstetrics and Gynecology

## 2017-09-16 ENCOUNTER — Ambulatory Visit: Payer: Medicare Other

## 2017-09-16 VITALS — BP 140/70 | HR 64 | Resp 18 | Ht 63.0 in | Wt 226.0 lb

## 2017-09-16 DIAGNOSIS — Z01419 Encounter for gynecological examination (general) (routine) without abnormal findings: Secondary | ICD-10-CM

## 2017-09-16 DIAGNOSIS — D3910 Neoplasm of uncertain behavior of unspecified ovary: Secondary | ICD-10-CM

## 2017-09-16 NOTE — Progress Notes (Addendum)
66 y.o. [id number] MarriedAfrican AmericanF here for annual exam.  She has a h/o hysterectomy with BSO. She had a Stage IC low malignancy tumor of her ovary and gets yearly CA 125's She has well controlled diabetes.  Earlier in the fall she had some vasomotor symptoms for a few weeks. Normal TSH, CBC. Symptoms spontaneously resolved. Currently fine.  She is up 1-2 x at night to void, not every night. Related to how much water she is drinking. Husband with Dementia. She can leave him alone for a while. Causes her lots of stress, he doesn't listen. She is trying to take care of herself. She has good support at [employer].     Patient's last menstrual period was 02/02/1993 (exact date).          Sexually active: No.  The current method of family planning is post menopausal/hysterectomy.    Exercising: Yes.    goes to the [employer] Smoker:  no  Health Maintenance: Pap:  08-29-13 WNL  History of abnormal Pap:  no MMG:  01-20-17 WNL Colonoscopy:  03-22-13 WNL repeat in 10 yrs  BMD:   01-13-17 WNL TDaP:  Unsure, will check with primary Gardasil: N/A   reports that  has never smoked. she has never used smokeless tobacco. She reports that she does not drink alcohol or use drugs. has 3 kids, 2 grand kids (local). 20 year old and 60 year old grand daughters. She see's the 33 year old a lot. Husband has dementia.   Past Medical History:  Diagnosis Date  . Allergic rhinitis, cause unspecified   . Arthritis   . Depression   . Diverticulosis   . Family history of anesthesia complication    NIECE HAD DIFFICULTY WAKING UP  . Hypersomnia   . OSA on CPAP    ? what yr or exactly when  . Other and unspecified hyperlipidemia   . Personal history of malignant neoplasm of ovary   . PONV (postoperative nausea and vomiting)   . Type II or unspecified type diabetes mellitus without mention of complication, not stated as uncontrolled   . Unspecified essential hypertension     Past Surgical History:  Procedure  Laterality Date  . ABDOMINAL ADHESION SURGERY  12/96   exc peritoneal cyst  . BREAST BIOPSY Left 2000   stereotactic, negative  . BREAST REDUCTION SURGERY Bilateral 10/03  . EYE SURGERY Left    cataracts  . LAPAROSCOPIC SALPINGO OOPHERECTOMY Right 12/96  . 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 LAPAROSCOPY ABDOMEN DIAGNOSTIC  2/08   w/LOA, LSO  . REDUCTION MAMMAPLASTY Bilateral 2004  . TONSILLECTOMY    . TOTAL ABDOMINAL HYSTERECTOMY W/ BILATERAL SALPINGOOPHORECTOMY  5/94   secondary to uterine fibroids  . TOTAL KNEE ARTHROPLASTY Right 05/12/2013   Procedure: TOTAL KNEE ARTHROPLASTY;  Surgeon: Alta Corning, MD;  Location: [facility];  Service: Orthopedics;  Laterality: Right;  . TRIGGER FINGER RELEASE  07/06/2012   Procedure: RELEASE TRIGGER FINGER/A-1 PULLEY;  Surgeon: Alta Corning, MD;  Location: [facility];  Service: Orthopedics;  Laterality: Left;  left ring finger  . TUBAL LIGATION      Current Outpatient Medications  Medication Sig Dispense Refill  . aspirin 325 MG EC tablet Take 81 mg by mouth daily.     Marland Kitchen atorvastatin (LIPITOR) 40 MG tablet Take 40 mg by mouth every morning.     . Calcium Carbonate (CALTRATE 600 PO) Take 1  tablet by mouth 2 (two) times daily.    . insulin detemir (LEVEMIR) 100 UNIT/ML injection Inject 20-25 Units into the skin 2 (two) times daily. 25 qam and 30 qhs    . metFORMIN (GLUCOPHAGE) 500 MG tablet Take 500 mg by mouth every morning.     . metoprolol (TOPROL-XL) 50 MG 24 hr tablet Take 50 mg by mouth daily.      . valsartan (DIOVAN) 160 MG tablet Take 160 mg by mouth daily.     No current facility-administered medications for this visit.     Family History  Problem Relation Age of Onset  . Lung cancer Mother   . Colon cancer Father   . Stroke Brother   . Stroke Sister   . Pulmonary Hypertension Sister     Review of Systems  Constitutional: Negative.   HENT: Negative.   Eyes:  Negative.   Respiratory: Negative.   Cardiovascular: Negative.   Gastrointestinal: Negative.   Endocrine: Negative.   Genitourinary: Negative.   Musculoskeletal: Negative.   Skin: Negative.   Allergic/Immunologic: Negative.   Neurological: Negative.   Psychiatric/Behavioral: Negative.     Exam:   BP 140/70 (BP Location: Right Arm, Patient Position: Sitting, Cuff Size: Normal)   Pulse 64   Resp 18   Ht 5\' 3"  (1.6 m)   Wt 226 lb (102.5 kg)   LMP 02/02/1993 (Exact Date)   BMI 40.03 kg/m   Weight change: @WEIGHTCHANGE @ Height:   Height: 5\' 3"  (160 cm)  Ht Readings from Last 3 Encounters:  09/16/17 5\' 3"  (1.6 m)  11/04/16 5\' 3"  (1.6 m)  09/09/16 5\' 3"  (1.6 m)    General appearance: alert, cooperative and appears stated age Head: Normocephalic, without obvious abnormality, atraumatic Neck: no adenopathy, supple, symmetrical, trachea midline and thyroid normal to inspection and palpation Lungs: clear to auscultation bilaterally Cardiovascular: regular rate and rhythm Breasts: normal appearance, no masses or tenderness, evidence of breast reduction Abdomen: soft, non-tender; non distended,  no masses,  no organomegaly Extremities: extremities normal, atraumatic, no cyanosis or edema Skin: Skin color, texture, turgor normal. No rashes or lesions Lymph nodes: Cervical, supraclavicular, and axillary nodes normal. No abnormal inguinal nodes palpated Neurologic: Grossly normal   Pelvic: External genitalia:  no lesions              Urethra:  normal appearing urethra with no masses, tenderness or lesions              Bartholins and Skenes: normal                 Vagina: normal appearing vagina with normal color and discharge, no lesions. Grade 1 cystocele and grade one rectocele (not symptomatic)              Cervix: absent               Bimanual Exam:  Uterus:  uterus absent              Adnexa: no mass, fullness, tenderness               Rectovaginal: Confirms                Anus:  normal sphincter tone, no lesions  Chaperone was present for exam.  A:  Well Woman with normal exam  H/O low malignant potential ovarian tumor  P:   She has been getting yearly CA 125  Mammogram UTD  Colonoscopy UTD  Labs with primary  Discussed  breast self exam  Discussed calcium and vit D intake   Addendum: Reviewed patient with Dr Denman George, no further f/u is needed after 10 years. Patient will be informed.

## 2017-09-16 NOTE — Patient Instructions (Signed)

## 2017-09-17 ENCOUNTER — Telehealth: Payer: Self-pay

## 2017-09-17 LAB — CA 125: Cancer Antigen (CA) 125: 5.2 U/mL (ref 0.0–38.1)

## 2017-09-17 NOTE — Telephone Encounter (Signed)
-----   Message from Kathy Dom, MD sent at 09/17/2017 12:32 PM EST ----- Please advise the patient of normal results.

## 2017-09-17 NOTE — Telephone Encounter (Signed)
Left detailed message at number provided [phone], okay per ROI. Advised of normal results and to return call to the office with any additional questions. Encounter closed.

## 2017-09-21 ENCOUNTER — Telehealth: Payer: Self-pay | Admitting: Obstetrics and Gynecology

## 2017-09-21 NOTE — Telephone Encounter (Signed)
Spoke with patient and gave recommendation per Dr. Talbert Nan and Dr. Denman George -eh

## 2017-09-21 NOTE — Telephone Encounter (Signed)
-----   Message from Everitt Amber, MD sent at 09/21/2017 10:59 AM EST ----- Warden Fillers, 10 years is usually adequate in these patients who have had a complete hysterectomy. So no more specific LMP follow-up. 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 I can't find any of the original recommendations from the Oncologist. She has been getting yearly CA 125's, should she continue this indefinitely? Thanks as always for your advice! Sharee Pimple

## 2017-09-21 NOTE — Telephone Encounter (Signed)
Please let the patient know that the GYN Oncologist said she doesn't need any further specific screening for her low malignant potential ovarian tumor since she is 10 years out.

## 2017-11-11 ENCOUNTER — Institutional Professional Consult (permissible substitution): Payer: Medicare Other | Admitting: Internal Medicine

## 2017-11-18 ENCOUNTER — Other Ambulatory Visit: Payer: Self-pay | Admitting: Podiatry

## 2017-11-18 ENCOUNTER — Ambulatory Visit: Payer: Medicare Other | Admitting: Podiatry

## 2017-11-18 ENCOUNTER — Encounter: Payer: Self-pay | Admitting: Podiatry

## 2017-11-18 ENCOUNTER — Ambulatory Visit (INDEPENDENT_AMBULATORY_CARE_PROVIDER_SITE_OTHER): Payer: Medicare Other

## 2017-11-18 DIAGNOSIS — M21621 Bunionette of right foot: Secondary | ICD-10-CM

## 2017-11-18 DIAGNOSIS — M21622 Bunionette of left foot: Secondary | ICD-10-CM | POA: Diagnosis not present

## 2017-11-18 DIAGNOSIS — L84 Corns and callosities: Secondary | ICD-10-CM | POA: Diagnosis not present

## 2017-11-18 NOTE — Progress Notes (Signed)
Subjective:   Patient ID: Kathy Reese, female   DOB: 67 y.o.   MRN: [id number]   HPI Patient presents with inflammation of the plantar forefoot bilateral with keratotic lesion formation fifth metatarsal left over right and mild to moderate fluid accumulation   ROS      Objective:  Physical Exam  Neurovascular status intact with tailor's bunion deformity bilateral with mild plantar inflammation     Assessment:  Inflammatory condition of the plantar foot bilateral     Plan:  Advised on anti-inflammatories physical therapy and patient will be seen back to recheck on an as-needed basis.  Sugar will be kept under control and patient is encouraged to exercise

## 2017-12-31 ENCOUNTER — Other Ambulatory Visit: Payer: Self-pay | Admitting: Internal Medicine

## 2017-12-31 DIAGNOSIS — Z1231 Encounter for screening mammogram for malignant neoplasm of breast: Secondary | ICD-10-CM

## 2018-01-26 ENCOUNTER — Ambulatory Visit: Payer: Medicare Other

## 2018-01-26 ENCOUNTER — Ambulatory Visit
Admission: RE | Admit: 2018-01-26 | Discharge: 2018-01-26 | Disposition: A | Discharge status: Home / Self Care | Payer: Medicare Other | Source: Ambulatory Visit | Attending: Internal Medicine | Admitting: Internal Medicine

## 2018-01-26 DIAGNOSIS — Z1231 Encounter for screening mammogram for malignant neoplasm of breast: Secondary | ICD-10-CM

## 2018-01-27 ENCOUNTER — Other Ambulatory Visit: Payer: Self-pay | Admitting: Internal Medicine

## 2018-01-27 DIAGNOSIS — R928 Other abnormal and inconclusive findings on diagnostic imaging of breast: Secondary | ICD-10-CM

## 2018-01-31 ENCOUNTER — Other Ambulatory Visit: Payer: Self-pay | Admitting: Internal Medicine

## 2018-01-31 ENCOUNTER — Other Ambulatory Visit: Payer: Self-pay

## 2018-01-31 DIAGNOSIS — R928 Other abnormal and inconclusive findings on diagnostic imaging of breast: Secondary | ICD-10-CM

## 2018-02-01 ENCOUNTER — Other Ambulatory Visit: Payer: Self-pay | Admitting: Internal Medicine

## 2018-02-01 ENCOUNTER — Ambulatory Visit
Admission: RE | Admit: 2018-02-01 | Discharge: 2018-02-01 | Disposition: A | Discharge status: Home / Self Care | Payer: Medicare Other | Source: Ambulatory Visit | Attending: Internal Medicine | Admitting: Internal Medicine

## 2018-02-01 DIAGNOSIS — R928 Other abnormal and inconclusive findings on diagnostic imaging of breast: Secondary | ICD-10-CM

## 2018-02-01 DIAGNOSIS — N6489 Other specified disorders of breast: Secondary | ICD-10-CM

## 2018-08-09 ENCOUNTER — Other Ambulatory Visit: Payer: Medicare Other

## 2018-08-19 ENCOUNTER — Ambulatory Visit: Admission: RE | Admit: 2018-08-19 | Payer: Medicare Other | Source: Ambulatory Visit

## 2018-08-19 ENCOUNTER — Ambulatory Visit
Admission: RE | Admit: 2018-08-19 | Discharge: 2018-08-19 | Disposition: A | Discharge status: Home / Self Care | Payer: Medicare Other | Source: Ambulatory Visit | Attending: Internal Medicine | Admitting: Internal Medicine

## 2018-08-19 DIAGNOSIS — N6489 Other specified disorders of breast: Secondary | ICD-10-CM

## 2018-08-24 ENCOUNTER — Other Ambulatory Visit: Payer: Self-pay

## 2018-08-24 ENCOUNTER — Emergency Department (HOSPITAL_COMMUNITY)
Admission: EM | Admit: 2018-08-24 | Discharge: 2018-08-24 | Disposition: A | Discharge status: Home / Self Care | Payer: Medicare Other | Attending: Emergency Medicine | Admitting: Emergency Medicine

## 2018-08-24 ENCOUNTER — Emergency Department (HOSPITAL_COMMUNITY): Payer: Medicare Other

## 2018-08-24 ENCOUNTER — Encounter (HOSPITAL_COMMUNITY): Payer: Self-pay | Admitting: Emergency Medicine

## 2018-08-24 DIAGNOSIS — Z79899 Other long term (current) drug therapy: Secondary | ICD-10-CM | POA: Insufficient documentation

## 2018-08-24 DIAGNOSIS — G510 Bell's palsy: Secondary | ICD-10-CM | POA: Insufficient documentation

## 2018-08-24 DIAGNOSIS — Z7982 Long term (current) use of aspirin: Secondary | ICD-10-CM | POA: Diagnosis not present

## 2018-08-24 DIAGNOSIS — R29810 Facial weakness: Secondary | ICD-10-CM | POA: Diagnosis present

## 2018-08-24 DIAGNOSIS — I10 Essential (primary) hypertension: Secondary | ICD-10-CM | POA: Diagnosis not present

## 2018-08-24 DIAGNOSIS — E119 Type 2 diabetes mellitus without complications: Secondary | ICD-10-CM | POA: Insufficient documentation

## 2018-08-24 LAB — CBC
HCT: 42.2 % (ref 36.0–46.0)
Hemoglobin: 13.3 g/dL (ref 12.0–15.0)
MCH: 28.7 pg (ref 26.0–34.0)
MCHC: 31.5 g/dL (ref 30.0–36.0)
MCV: 90.9 fL (ref 80.0–100.0)
Platelets: 211 10*3/uL (ref 150–400)
RBC: 4.64 MIL/uL (ref 3.87–5.11)
RDW: 13.4 % (ref 11.5–15.5)
WBC: 10.6 10*3/uL — ABNORMAL HIGH (ref 4.0–10.5)
nRBC: 0 % (ref 0.0–0.2)

## 2018-08-24 LAB — DIFFERENTIAL
Abs Immature Granulocytes: 0.03 10*3/uL (ref 0.00–0.07)
Basophils Absolute: 0 10*3/uL (ref 0.0–0.1)
Basophils Relative: 0 %
Eosinophils Absolute: 0.1 10*3/uL (ref 0.0–0.5)
Eosinophils Relative: 1 %
Immature Granulocytes: 0 %
Lymphocytes Relative: 31 %
Lymphs Abs: 3.2 10*3/uL (ref 0.7–4.0)
Monocytes Absolute: 0.9 10*3/uL (ref 0.1–1.0)
Monocytes Relative: 8 %
Neutro Abs: 6.3 10*3/uL (ref 1.7–7.7)
Neutrophils Relative %: 60 %

## 2018-08-24 LAB — COMPREHENSIVE METABOLIC PANEL
ALT: 28 U/L (ref 0–44)
AST: 26 U/L (ref 15–41)
Albumin: 4.4 g/dL (ref 3.5–5.0)
Alkaline Phosphatase: 96 U/L (ref 38–126)
Anion gap: 10 (ref 5–15)
BUN: 19 mg/dL (ref 8–23)
CO2: 24 mmol/L (ref 22–32)
Calcium: 10 mg/dL (ref 8.9–10.3)
Chloride: 102 mmol/L (ref 98–111)
Creatinine, Ser: 0.81 mg/dL (ref 0.44–1.00)
GFR calc Af Amer: >60 mL/min (ref >60)
GFR calc non Af Amer: >60 mL/min (ref >60)
Glucose, Bld: 142 mg/dL — ABNORMAL HIGH (ref 70–99)
Potassium: 3.5 mmol/L (ref 3.5–5.1)
Sodium: 136 mmol/L (ref 135–145)
Total Bilirubin: 0.8 mg/dL (ref 0.3–1.2)
Total Protein: 7.9 g/dL (ref 6.5–8.1)

## 2018-08-24 LAB — PROTIME-INR
INR: 1.01
Prothrombin Time: 13.2 seconds (ref 11.4–15.2)

## 2018-08-24 LAB — I-STAT TROPONIN, ED: Troponin i, poc: 0 ng/mL (ref 0.00–0.08)

## 2018-08-24 LAB — APTT: aPTT: 27 seconds (ref 24–36)

## 2018-08-24 MED ORDER — PREDNISONE 10 MG PO TABS: 60.0000 mg | ORAL_TABLET | Freq: Every day | ORAL | 0 refills | Status: AC

## 2018-08-24 MED ORDER — VALACYCLOVIR HCL 1 G PO TABS: 1000.0000 mg | ORAL_TABLET | Freq: Three times a day (TID) | ORAL | 0 refills | Status: AC

## 2018-08-24 MED ORDER — HYPROMELLOSE (GONIOSCOPIC) 2.5 % OP SOLN
1.0000 [drp] | Freq: Once | OPHTHALMIC | Status: AC
  Administered 2018-08-24: 1 [drp] via OPHTHALMIC
  Filled 2018-08-24: qty 15

## 2018-08-24 NOTE — ED Triage Notes (Addendum)
C/o numbness/tingling to R side of face since waking up this morning. States mouth feels like she had dental work done.  Also c/o L eye redness, itching, burning, and watering x 2 days. L sided facial droop.   LKW 5am when she got up during the night.  No arm drift.  Grips strong and equal.

## 2018-08-24 NOTE — ED Notes (Signed)
Patient transported to CT 

## 2018-08-24 NOTE — ED Notes (Signed)
Patient verbalizes understanding of discharge instructions. Opportunity for questioning and answers were provided. 

## 2018-08-24 NOTE — ED Provider Notes (Signed)
[facility] EMERGENCY DEPARTMENT Provider Note   CSN: [id number] Arrival date & time: 08/24/18  1353     History   Chief Complaint Chief Complaint  Patient presents with  . Facial Droop    HPI Kathy Reese is a 67 y.o. female with a past medical history of diabetes, hypertension who presents to ED for 8-hour history of left-sided facial droop, paresthesias to right side of her face.  She states that she has had 2-day history of left eye redness, itching and burning.  No history of similar symptoms in the past.  Denies any injuries or falls.  States that she went to bed last night feeling like her usual self.  Denies any changes to gait, weakness in arms or legs.  No history of CVA.  Denies any shortness of breath, headache, vision changes.  HPI  Past Medical History:  Diagnosis Date  . Allergic rhinitis, cause unspecified   . Arthritis   . Depression   . Diverticulosis   . Family history of anesthesia complication    NIECE HAD DIFFICULTY WAKING UP  . Hypersomnia   . OSA on CPAP    ? what yr or exactly when  . Other and unspecified hyperlipidemia   . Personal history of malignant neoplasm of ovary   . PONV (postoperative nausea and vomiting)   . Type II or unspecified type diabetes mellitus without mention of complication, not stated as uncontrolled   . Unspecified essential hypertension     Patient Active Problem List   Diagnosis Date Noted  . Osteoarthritis of right knee 05/12/2013  . HYPERLIPIDEMIA 12/30/2007  . HYPERTENSION 12/30/2007  . INSOMNIA 12/30/2007  . HYPERSOMNIA 12/30/2007  . DM w/o Complication Type II 60/07/9322  . HYPERCHOLESTEROLEMIA 12/16/2007  . EXOGENOUS OBESITY 12/16/2007  . SLEEP APNEA, OBSTRUCTIVE 12/16/2007  . ALLERGIC RHINITIS 12/16/2007    Past Surgical History:  Procedure Laterality Date  . ABDOMINAL ADHESION SURGERY  12/96   exc peritoneal cyst  . BREAST BIOPSY Left 2000   stereotactic, negative  . BREAST  REDUCTION SURGERY Bilateral 10/03  . EYE SURGERY Left    cataracts  . LAPAROSCOPIC SALPINGO OOPHERECTOMY Right 12/96  . 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 LAPAROSCOPY ABDOMEN DIAGNOSTIC  2/08   w/LOA, LSO  . REDUCTION MAMMAPLASTY Bilateral 2004  . TONSILLECTOMY    . TOTAL ABDOMINAL HYSTERECTOMY W/ BILATERAL SALPINGOOPHORECTOMY  5/94   secondary to uterine fibroids  . TOTAL KNEE ARTHROPLASTY Right 05/12/2013   Procedure: TOTAL KNEE ARTHROPLASTY;  Surgeon: Alta Corning, MD;  Location: [facility];  Service: Orthopedics;  Laterality: Right;  . TRIGGER FINGER RELEASE  07/06/2012   Procedure: RELEASE TRIGGER FINGER/A-1 PULLEY;  Surgeon: Alta Corning, MD;  Location: [facility];  Service: Orthopedics;  Laterality: Left;  left ring finger  . TUBAL LIGATION       OB History    Gravida  4   Para  4   Term  3   Preterm  1   AB  0   Living  3     SAB  0   TAB  0   Ectopic  0   Multiple  0   Live Births  4            Home Medications    Prior to Admission medications   Medication Sig Start Date End Date Taking? Authorizing Provider  aspirin 81  MG tablet Take 81 mg by mouth daily.  05/15/13  Yes Gary Fleet, PA-C  atorvastatin (LIPITOR) 40 MG tablet Take 40 mg by mouth daily at 6 PM.    Yes [provider]  Calcium Carbonate (CALTRATE 600 PO) Take 1 tablet by mouth 2 (two) times daily.   Yes [provider]  cholecalciferol (VITAMIN D3) 25 MCG (1000 UT) tablet Take 1,000 Units by mouth daily.   Yes [provider]  CINNAMON PO Take 1,000 mg by mouth daily.   Yes [provider]  insulin detemir (LEVEMIR) 100 UNIT/ML injection Inject 15 Units into the skin 2 (two) times daily.    Yes [provider]  losartan-hydrochlorothiazide (HYZAAR) 100-25 MG tablet Take 1 tablet by mouth daily. 06/19/18  Yes [provider]  metFORMIN (GLUCOPHAGE) 500 MG tablet Take  500 mg by mouth every morning.    Yes [provider]  metoprolol (TOPROL-XL) 50 MG 24 hr tablet Take 50 mg by mouth daily.     Yes [provider]  predniSONE (DELTASONE) 10 MG tablet Take 6 tablets (60 mg total) by mouth daily for 7 days. 08/24/18 08/31/18  Casper Pagliuca, PA-C  valACYclovir (VALTREX) 1000 MG tablet Take 1 tablet (1,000 mg total) by mouth 3 (three) times daily for 7 days. 08/24/18 08/31/18  Delia Heady, PA-C    Family History Family History  Problem Relation Age of Onset  . Lung cancer Mother   . Colon cancer Father   . Stroke Brother   . Stroke Sister   . Pulmonary Hypertension Sister     Social History Social History   Tobacco Use  . Smoking status: Never Smoker  . Smokeless tobacco: Never Used  Substance Use Topics  . Alcohol use: No  . Drug use: No     Allergies   Patient has no known allergies.   Review of Systems Review of Systems  Constitutional: Negative for appetite change, chills and fever.  HENT: Negative for ear pain, rhinorrhea, sneezing and sore throat.   Eyes: Positive for discharge, redness and itching. Negative for photophobia and visual disturbance.  Respiratory: Negative for cough, chest tightness, shortness of breath and wheezing.   Cardiovascular: Negative for chest pain and palpitations.  Gastrointestinal: Negative for abdominal pain, blood in stool, constipation, diarrhea, nausea and vomiting.  Genitourinary: Negative for dysuria, hematuria and urgency.  Musculoskeletal: Negative for myalgias.  Skin: Negative for rash.  Neurological: Positive for facial asymmetry. Negative for dizziness, weakness and light-headedness.     Physical Exam Updated Vital Signs BP (!) 173/56   Pulse 65   Temp 98.8 F (37.1 C) (Oral)   Resp 13   Ht 5\' 4"  (1.626 m)   Wt 96.6 kg   LMP 02/02/1993 (Exact Date)   SpO2 100%   BMI 36.56 kg/m   Physical Exam  Constitutional: She appears well-developed and well-nourished. No  distress.  HENT:  Head: Normocephalic and atraumatic.  Nose: Nose normal.  Eyes: Pupils are equal, round, and reactive to light. EOM are normal. Right eye exhibits no discharge. Left eye exhibits no discharge. Left conjunctiva is injected. No scleral icterus.  Neck: Normal range of motion. Neck supple.  Cardiovascular: Normal rate, regular rhythm, normal heart sounds and intact distal pulses. Exam reveals no gallop and no friction rub.  No murmur heard. Pulmonary/Chest: Effort normal and breath sounds normal. No respiratory distress.  Abdominal: Soft. Bowel sounds are normal. She exhibits no distension. There is no tenderness. There is no guarding.  Musculoskeletal: Normal range of motion. She exhibits no edema.  Neurological: She is alert. She exhibits normal muscle tone. Coordination normal.  Asymmetrical smile noted 2/2 subtle L sided facial droop. Dry L eye, unable to fully close. AAOx4.  Equal grip strength noted bilaterally.  Strength 5/5 in bilateral upper and lower extremities.  Sensation intact to light touch of face, upper and lower extremities.  Normal finger-to-nose coordination.  Skin: Skin is warm and dry. No rash noted.  Psychiatric: She has a normal mood and affect.  Nursing note and vitals reviewed.    ED Treatments / Results  Labs (all labs ordered are listed, but only abnormal results are displayed) Labs Reviewed  CBC - Abnormal; Notable for the following components:      Result Value   WBC 10.6 (*)    All other components within normal limits  COMPREHENSIVE METABOLIC PANEL - Abnormal; Notable for the following components:   Glucose, Bld 142 (*)    All other components within normal limits  PROTIME-INR  APTT  DIFFERENTIAL  I-STAT TROPONIN, ED    EKG EKG Interpretation  Date/Time:  Wednesday August 24 2018 13:58:35 EST Ventricular Rate:  80 PR Interval:  166 QRS Duration: 84 QT Interval:  388 QTC Calculation: 447 R Axis:   62 Text Interpretation:   Normal sinus rhythm Normal ECG When compared to prior, no significant changes seen.  No STEMI Confirmed by Antony Blackbird [phone]) on 08/24/2018 2:32:38 PM   Radiology Ct Head Wo Contrast  Result Date: 08/24/2018 CLINICAL DATA:  numbness/tingling to R side of face since waking up this morning. States mouth feels like she had dental work done. Also c/o L eye redness, itching, burning, and watering x 2 days. L sided facial droop. LKW 5am when she got up during the night. No arm drift. Grips strong and equal. EXAM: CT HEAD WITHOUT CONTRAST TECHNIQUE: Contiguous axial images were obtained from the base of the skull through the vertex without intravenous contrast. COMPARISON:  11/17/2004 FINDINGS: Brain: No evidence of acute infarction, hemorrhage, hydrocephalus, extra-axial collection or mass lesion/mass effect. Vascular: No hyperdense vessel or unexpected calcification. Skull: Normal. Negative for fracture or focal lesion. Sinuses/Orbits: No acute finding. Other: None. IMPRESSION: Negative Electronically Signed   By: Lucrezia Europe M.D.   On: 08/24/2018 15:04   Mr Brain Wo Contrast  Result Date: 08/24/2018 CLINICAL DATA:  Numbness and tingling to RIGHT-sided face. Last seen normal 5 a.m. Stroke risk factors include hypertension and diabetes. EXAM: MRI HEAD WITHOUT CONTRAST TECHNIQUE: Multiplanar, multiecho pulse sequences of the brain and surrounding structures were obtained without intravenous contrast. COMPARISON:  CT head earlier today. FINDINGS: Brain: No acute infarction, hemorrhage, hydrocephalus, extra-axial collection or mass lesion. Normal for age cerebral volume. Mild subcortical and periventricular T2 and FLAIR hyperintensities, likely chronic microvascular ischemic change. Vascular: Normal flow voids. Skull and upper cervical spine: Normal marrow signal. Mild cervical spondylosis. Sinuses/Orbits: No layering fluid.  BILATERAL cataract extraction. Other: None.  Compared with earlier CT, similar  appearance. IMPRESSION: Mild subcortical and periventricular T2 and FLAIR hyperintensities, likely chronic microvascular ischemic change. No acute intracranial findings. Electronically Signed   By: Staci Righter M.D.   On: 08/24/2018 17:20    Procedures Procedures (including critical care time)  Medications Ordered in ED Medications  hydroxypropyl methylcellulose / hypromellose (ISOPTO TEARS / GONIOVISC) 2.5 % ophthalmic solution 1 drop (has no administration in time range)     Initial Impression / Assessment and Plan / ED Course  I have reviewed  the triage vital signs and the nursing notes.  Pertinent labs & imaging results that were available during my care of the patient were reviewed by me and considered in my medical decision making (see chart for details).  Clinical Course as of Aug 24 1737  Wed Aug 24, 2018  1616 Spoke to Dr. Cheral Marker, neurology.  He recommends MRI of the brain to evaluate for neurological cause of symptoms.  States that if this is negative, she can follow-up outpatient with neurology.   [HK]    Clinical Course User Index [HK] Delia Heady, PA-C    67 year old female with past medical history of diabetes, hypertension, hyperlipidemia presents to ED for 8-hour history of left-sided facial droop, paresthesias to the right side of her face.  She has had 2-day history of left eye redness, itching and burning.  No history of similar symptoms in the past.  On exam there is mild left-sided facial droop noted.  Unable to fully close her left eye.  There is conjunctival injection noted.  She denies any pain with EOMs or visual field cuts.  Denies any vision changes, headache.  Equal strength in bilateral upper and lower extremities.  Lab work is unremarkable.  CT of the head is negative.  EKG shows normal sinus rhythm.  I spoke to Dr. Cheral Marker of neurology who recommends MRI of the brain. Will obtain MRI and reassess. More likely Bell's palsy, but due to patient's risk factors,  feel that further imaging is warranted to r/o CVA.  5:38 PM MRI shows possible chronic microvascular ischemic changes.  There is no acute abnormalities noted.  We will treat as Bell's palsy with steroids and antivirals.  Patient is diabetic so we will have her check blood sugars and adjust her insulin accordingly.  Advised patient to follow-up with her primary care provider and to follow-up with neurology.  Patient is agreeable to this plan.  She will be given eyedrops to help with lubrication.  Advised to return to ED for any severe worsening symptoms.  Patient is hemodynamically stable, in NAD, and able to ambulate in the ED. Evaluation does not show pathology that would require ongoing emergent intervention or inpatient treatment. I explained the diagnosis to the patient. Pain has been managed and has no complaints prior to discharge. Patient is comfortable with above plan and is stable for discharge at this time. All questions were answered prior to disposition. Strict return precautions for returning to the ED were discussed. Encouraged follow up with PCP.    Portions of this note were generated with [employer]. Dictation errors may occur despite best attempts at proofreading.   Final Clinical Impressions(s) / ED Diagnoses   Final diagnoses:  Left-sided Bell's palsy    ED Discharge Orders         Ordered    predniSONE (DELTASONE) 10 MG tablet  Daily     08/24/18 1734    valACYclovir (VALTREX) 1000 MG tablet  3 times daily     08/24/18 [place], Safal Halderman, PA-C 08/24/18 1738    Tegeler, Gwenyth Allegra, MD 08/25/18 [phone]

## 2018-08-24 NOTE — Discharge Instructions (Addendum)
Take the steroids and antivirals as directed. You will need to follow-up with the neurologist listed below for further evaluation because you did have some chronic changes seen in your MRI of your brain. Return to ED for worsening symptoms, trouble walking, numbness in arms or legs that worsens, injuries.

## 2018-08-24 NOTE — ED Notes (Signed)
Patient transported to MRI 

## 2018-10-10 NOTE — Progress Notes (Signed)
68 y.o. [id number] Married Black or [place] American Not Hispanic or Latino female here for annual exam.   She has a h/o a hysterectomy/BSO. 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 She was getting yearly CA 125, per Dr Denman George she no longer needs these.  Husband has dementia, going to [facility] day progam 5 days a week. Her youngest daughter is going to move in to help with her Dad.   She has urinary urgency, long term. She drinks lots of water. No regular caffeine. No urinary frequency, no leakage. Nocturia 1-2 x at night, depends when she stops drinking water.     Patient's last menstrual period was 02/02/1993 (exact date).          Sexually active: No.  The current method of family planning is status post hysterectomy.    Exercising: Yes.    gym Smoker:  no  Health Maintenance: Pap:  08-29-13 WNL  History of abnormal Pap:  No MMG:  02/01/2018 Birads 3 probably benign, 08/19/2018 diagnostic left Birads 1 negative Colonoscopy:  03-22-13 WNL repeat in 10 yrs  BMD:   2018 WNL TDaP: UTD Gardasil: N/A   reports that she has never smoked. She has never used smokeless tobacco. She reports that she does not drink alcohol or use drugs. She has 3 kids, 2 grand daughters (local). Husband has dementia.    Past Medical History:  Diagnosis Date  . Allergic rhinitis, cause unspecified   . Arthritis   . Depression   . Diverticulosis   . Family history of anesthesia complication    NIECE HAD DIFFICULTY WAKING UP  . History of Bell's palsy   . Hypersomnia   . OSA on CPAP    ? what yr or exactly when  . Other and unspecified hyperlipidemia   . Personal history of malignant neoplasm of ovary   . PONV (postoperative nausea and vomiting)   . Type II or unspecified type diabetes mellitus without mention of complication, not stated as uncontrolled   . Unspecified essential hypertension     Past Surgical History:  Procedure Laterality Date  . ABDOMINAL ADHESION SURGERY   12/96   exc peritoneal cyst  . BREAST BIOPSY Left 2000   stereotactic, negative  . BREAST REDUCTION SURGERY Bilateral 10/03  . EYE SURGERY Left    cataracts  . LAPAROSCOPIC SALPINGO OOPHERECTOMY Right 12/96  . 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 LAPAROSCOPY ABDOMEN DIAGNOSTIC  2/08   w/LOA, LSO  . REDUCTION MAMMAPLASTY Bilateral 2004  . TONSILLECTOMY    . TOTAL ABDOMINAL HYSTERECTOMY W/ BILATERAL SALPINGOOPHORECTOMY  5/94   secondary to uterine fibroids  . TOTAL KNEE ARTHROPLASTY Right 05/12/2013   Procedure: TOTAL KNEE ARTHROPLASTY;  Surgeon: Alta Corning, MD;  Location: [facility];  Service: Orthopedics;  Laterality: Right;  . TRIGGER FINGER RELEASE  07/06/2012   Procedure: RELEASE TRIGGER FINGER/A-1 PULLEY;  Surgeon: Alta Corning, MD;  Location: [place];  Service: Orthopedics;  Laterality: Left;  left ring finger  . TUBAL LIGATION      Current Outpatient Medications  Medication Sig Dispense Refill  . aspirin 81 MG tablet Take 81 mg by mouth daily.     Marland Kitchen atorvastatin (LIPITOR) 40 MG tablet Take 40 mg by mouth daily at 6 PM.     . Calcium Carbonate (CALTRATE 600 PO) Take 1 tablet by mouth 2 (two) times daily.    Marland Kitchen  cholecalciferol (VITAMIN D3) 25 MCG (1000 UT) tablet Take 1,000 Units by mouth daily.    Marland Kitchen CINNAMON PO Take 1,000 mg by mouth daily.    . insulin detemir (LEVEMIR) 100 UNIT/ML injection Inject 15 Units into the skin 2 (two) times daily.     Marland Kitchen losartan-hydrochlorothiazide (HYZAAR) 100-25 MG tablet Take 1 tablet by mouth daily.  4  . metFORMIN (GLUCOPHAGE) 500 MG tablet Take 500 mg by mouth every morning.     . metoprolol (TOPROL-XL) 50 MG 24 hr tablet Take 50 mg by mouth daily.       No current facility-administered medications for this visit.     Family History  Problem Relation Age of Onset  . Lung cancer Mother   . Colon cancer Father   . Stroke Brother   . Stroke Sister   . Pulmonary  Hypertension Sister     Review of Systems  Constitutional: Negative.   HENT: Negative.   Eyes: Negative.   Respiratory: Negative.   Cardiovascular: Negative.   Gastrointestinal: Negative.   Endocrine: Negative.   Genitourinary: Negative.   Musculoskeletal: Negative.   Skin: Negative.   Allergic/Immunologic: Negative.   Neurological: Negative.   Hematological: Negative.   Psychiatric/Behavioral: Negative.     Exam:   BP 122/62 (BP Location: Right Arm, Patient Position: Sitting, Cuff Size: Normal)   Pulse 64   Ht 5\' 3"  (1.6 m)   Wt 216 lb (98 kg)   LMP 02/02/1993 (Exact Date)   BMI 38.26 kg/m   Weight change: @WEIGHTCHANGE @ Height:   Height: 5\' 3"  (160 cm)  Ht Readings from Last 3 Encounters:  10/12/18 5\' 3"  (1.6 m)  08/24/18 5\' 4"  (1.626 m)  09/16/17 5\' 3"  (1.6 m)    General appearance: alert, cooperative and appears stated age Head: Normocephalic, without obvious abnormality, atraumatic Neck: no adenopathy, supple, symmetrical, trachea midline and thyroid normal to inspection and palpation Lungs: clear to auscultation bilaterally Cardiovascular: regular rate and rhythm Breasts: normal appearance, no masses or tenderness Abdomen: soft, non-tender; non distended,  no masses,  no organomegaly Extremities: extremities normal, atraumatic, no cyanosis or edema Skin: Skin color, texture, turgor normal. No rashes or lesions Lymph nodes: Cervical, supraclavicular, and axillary nodes normal. No abnormal inguinal nodes palpated Neurologic: Grossly normal   Pelvic: External genitalia:  no lesions              Urethra:  normal appearing urethra with no masses, tenderness or lesions              Bartholins and Skenes: normal                 Vagina: normal appearing vagina with normal color and discharge, no lesions              Cervix: absent               Bimanual Exam:  Uterus:  uterus absent              Adnexa: no mass, fullness, tenderness               Rectovaginal:  Confirms               Anus:  normal sphincter tone, no lesions  Chaperone was present for exam.  A:  Well Woman with normal exam  H/O hysterectomy BSO  Ovary with low malignant potential (2008).   No longer needs CA 125's  P:   No pap needed  Mammogram due  in 4/20  Discussed breast self exam  Discussed calcium and vit D intake  Lab with primary

## 2018-10-12 ENCOUNTER — Other Ambulatory Visit: Payer: Self-pay

## 2018-10-12 ENCOUNTER — Ambulatory Visit (INDEPENDENT_AMBULATORY_CARE_PROVIDER_SITE_OTHER): Payer: Medicare Other | Admitting: Obstetrics and Gynecology

## 2018-10-12 ENCOUNTER — Encounter: Payer: Self-pay | Admitting: Obstetrics and Gynecology

## 2018-10-12 VITALS — BP 122/62 | HR 64 | Ht 63.0 in | Wt 216.0 lb

## 2018-10-12 DIAGNOSIS — Z01419 Encounter for gynecological examination (general) (routine) without abnormal findings: Secondary | ICD-10-CM | POA: Diagnosis not present

## 2018-10-12 DIAGNOSIS — Z90722 Acquired absence of ovaries, bilateral: Secondary | ICD-10-CM

## 2018-10-12 DIAGNOSIS — Z90710 Acquired absence of both cervix and uterus: Secondary | ICD-10-CM | POA: Diagnosis not present

## 2018-10-12 DIAGNOSIS — Z8603 Personal history of neoplasm of uncertain behavior: Secondary | ICD-10-CM | POA: Diagnosis not present

## 2018-10-12 DIAGNOSIS — Z9079 Acquired absence of other genital organ(s): Secondary | ICD-10-CM

## 2018-10-12 NOTE — Patient Instructions (Signed)
EXERCISE AND DIET:  We recommended that you start or continue a regular exercise program for good health. Regular exercise means any activity that makes your heart beat faster and makes you sweat.  We recommend exercising at least 30 minutes per day at least 3 days a week, preferably 4 or 5.  We also recommend a diet low in fat and sugar.  Inactivity, poor dietary choices and obesity can cause diabetes, heart attack, stroke, and kidney damage, among others.    ALCOHOL AND SMOKING:  Women should limit their alcohol intake to no more than 7 drinks/beers/glasses of wine (combined, not each!) per week. Moderation of alcohol intake to this level decreases your risk of breast cancer and liver damage. And of course, no recreational drugs are part of a healthy lifestyle.  And absolutely no smoking or even second hand smoke. Most people know smoking can cause heart and lung diseases, but did you know it also contributes to weakening of your bones? Aging of your skin?  Yellowing of your teeth and nails?  CALCIUM AND VITAMIN D:  Adequate intake of calcium and Vitamin D are recommended.  The recommendations for exact amounts of these supplements seem to change often, but generally speaking 1,200 mg of calcium (between diet and supplement) and 800 units of Vitamin D per day seems prudent. Certain women may benefit from higher intake of Vitamin D.  If you are among these women, your doctor will have told you during your visit.    PAP SMEARS:  Pap smears, to check for cervical cancer or precancers,  have traditionally been done yearly, although recent scientific advances have shown that most women can have pap smears less often.  However, every woman still should have a physical exam from her gynecologist every year. It will include a breast check, inspection of the vulva and vagina to check for abnormal growths or skin changes, a visual exam of the cervix, and then an exam to evaluate the size and shape of the uterus and  ovaries.  And after 68 years of age, a rectal exam is indicated to check for rectal cancers. We will also provide age appropriate advice regarding health maintenance, like when you should have certain vaccines, screening for sexually transmitted diseases, bone density testing, colonoscopy, mammograms, etc.   MAMMOGRAMS:  All women over 40 years old should have a yearly mammogram. Many facilities now offer a "3D" mammogram, which may cost around $50 extra out of pocket. If possible,  we recommend you accept the option to have the 3D mammogram performed.  It both reduces the number of women who will be called back for extra views which then turn out to be normal, and it is better than the routine mammogram at detecting truly abnormal areas.    COLON CANCER SCREENING: Now recommend starting at age 45. At this time colonoscopy is not covered for routine screening until 50. There are take home tests that can be done between 45-49.   COLONOSCOPY:  Colonoscopy to screen for colon cancer is recommended for all women at age 50.  We know, you hate the idea of the prep.  We agree, BUT, having colon cancer and not knowing it is worse!!  Colon cancer so often starts as a polyp that can be seen and removed at colonscopy, which can quite literally save your life!  And if your first colonoscopy is normal and you have no family history of colon cancer, most women don't have to have it again for   10 years.  Once every ten years, you can do something that may end up saving your life, right?  We will be happy to help you get it scheduled when you are ready.  Be sure to check your insurance coverage so you understand how much it will cost.  It may be covered as a preventative service at no cost, but you should check your particular policy.      Breast Self-Awareness Breast self-awareness means being familiar with how your breasts look and feel. It involves checking your breasts regularly and reporting any changes to your  health care provider. Practicing breast self-awareness is important. A change in your breasts can be a sign of a serious medical problem. Being familiar with how your breasts look and feel allows you to find any problems early, when treatment is more likely to be successful. All women should practice breast self-awareness, including women who have had breast implants. How to do a breast self-exam One way to learn what is normal for your breasts and whether your breasts are changing is to do a breast self-exam. To do a breast self-exam: Look for Changes  1. Remove all the clothing above your waist. 2. Stand in front of a mirror in a room with good lighting. 3. Put your hands on your hips. 4. Push your hands firmly downward. 5. Compare your breasts in the mirror. Look for differences between them (asymmetry), such as: ? Differences in shape. ? Differences in size. ? Puckers, dips, and bumps in one breast and not the other. 6. Look at each breast for changes in your skin, such as: ? Redness. ? Scaly areas. 7. Look for changes in your nipples, such as: ? Discharge. ? Bleeding. ? Dimpling. ? Redness. ? A change in position. Feel for Changes Carefully feel your breasts for lumps and changes. It is best to do this while lying on your back on the floor and again while sitting or standing in the shower or tub with soapy water on your skin. Feel each breast in the following way:  Place the arm on the side of the breast you are examining above your head.  Feel your breast with the other hand.  Start in the nipple area and make  inch (2 cm) overlapping circles to feel your breast. Use the pads of your three middle fingers to do this. Apply light pressure, then medium pressure, then firm pressure. The light pressure will allow you to feel the tissue closest to the skin. The medium pressure will allow you to feel the tissue that is a little deeper. The firm pressure will allow you to feel the tissue  close to the ribs.  Continue the overlapping circles, moving downward over the breast until you feel your ribs below your breast.  Move one finger-width toward the center of the body. Continue to use the  inch (2 cm) overlapping circles to feel your breast as you move slowly up toward your collarbone.  Continue the up and down exam using all three pressures until you reach your armpit.  Write Down What You Find  Write down what is normal for each breast and any changes that you find. Keep a written record with breast changes or normal findings for each breast. By writing this information down, you do not need to depend only on memory for size, tenderness, or location. Write down where you are in your menstrual cycle, if you are still menstruating. If you are having trouble noticing differences   in your breasts, do not get discouraged. With time you will become more familiar with the variations in your breasts and more comfortable with the exam. How often should I examine my breasts? Examine your breasts every month. If you are breastfeeding, the best time to examine your breasts is after a feeding or after using a breast pump. If you menstruate, the best time to examine your breasts is 5-7 days after your period is over. During your period, your breasts are lumpier, and it may be more difficult to notice changes. When should I see my health care provider? See your health care provider if you notice:  A change in shape or size of your breasts or nipples.  A change in the skin of your breast or nipples, such as a reddened or scaly area.  Unusual discharge from your nipples.  A lump or thick area that was not there before.  Pain in your breasts.  Anything that concerns you.  

## 2018-11-02 ENCOUNTER — Ambulatory Visit (INDEPENDENT_AMBULATORY_CARE_PROVIDER_SITE_OTHER): Payer: Medicare Other | Admitting: Neurology

## 2018-11-02 ENCOUNTER — Encounter

## 2018-11-02 DIAGNOSIS — R29810 Facial weakness: Secondary | ICD-10-CM

## 2018-11-07 DIAGNOSIS — R29810 Facial weakness: Secondary | ICD-10-CM | POA: Insufficient documentation

## 2018-11-07 NOTE — Progress Notes (Signed)
Left

## 2019-01-23 ENCOUNTER — Other Ambulatory Visit: Payer: Self-pay | Admitting: Internal Medicine

## 2019-01-23 DIAGNOSIS — Z1231 Encounter for screening mammogram for malignant neoplasm of breast: Secondary | ICD-10-CM

## 2019-03-14 ENCOUNTER — Other Ambulatory Visit: Payer: Self-pay | Admitting: *Deleted

## 2019-03-14 DIAGNOSIS — Z20822 Contact with and (suspected) exposure to covid-19: Secondary | ICD-10-CM

## 2019-03-16 LAB — NOVEL CORONAVIRUS, NAA: SARS-CoV-2, NAA: NOT DETECTED

## 2019-03-29 ENCOUNTER — Ambulatory Visit
Admission: RE | Admit: 2019-03-29 | Discharge: 2019-03-29 | Disposition: A | Discharge status: Home / Self Care | Payer: Medicare Other | Source: Ambulatory Visit | Attending: Internal Medicine | Admitting: Internal Medicine

## 2019-03-29 ENCOUNTER — Other Ambulatory Visit: Payer: Self-pay

## 2019-03-29 DIAGNOSIS — Z1231 Encounter for screening mammogram for malignant neoplasm of breast: Secondary | ICD-10-CM

## 2019-03-30 ENCOUNTER — Other Ambulatory Visit: Payer: Self-pay | Admitting: Internal Medicine

## 2019-03-30 DIAGNOSIS — R928 Other abnormal and inconclusive findings on diagnostic imaging of breast: Secondary | ICD-10-CM

## 2019-04-05 ENCOUNTER — Other Ambulatory Visit: Payer: Self-pay

## 2019-04-05 ENCOUNTER — Ambulatory Visit
Admission: RE | Admit: 2019-04-05 | Discharge: 2019-04-05 | Disposition: A | Discharge status: Home / Self Care | Payer: Medicare Other | Source: Ambulatory Visit | Attending: Internal Medicine | Admitting: Internal Medicine

## 2019-04-05 ENCOUNTER — Other Ambulatory Visit: Payer: Self-pay | Admitting: Internal Medicine

## 2019-04-05 DIAGNOSIS — R928 Other abnormal and inconclusive findings on diagnostic imaging of breast: Secondary | ICD-10-CM

## 2019-04-05 DIAGNOSIS — N6310 Unspecified lump in the right breast, unspecified quadrant: Secondary | ICD-10-CM

## 2019-04-12 ENCOUNTER — Ambulatory Visit
Admission: RE | Admit: 2019-04-12 | Discharge: 2019-04-12 | Disposition: A | Discharge status: Home / Self Care | Payer: Medicare Other | Source: Ambulatory Visit | Attending: Internal Medicine | Admitting: Internal Medicine

## 2019-04-12 DIAGNOSIS — N6310 Unspecified lump in the right breast, unspecified quadrant: Secondary | ICD-10-CM

## 2019-04-19 ENCOUNTER — Encounter: Payer: Self-pay | Admitting: Adult Health

## 2019-04-19 DIAGNOSIS — C50211 Malignant neoplasm of upper-inner quadrant of right female breast: Secondary | ICD-10-CM | POA: Insufficient documentation

## 2019-04-20 ENCOUNTER — Telehealth: Payer: Self-pay | Admitting: Hematology and Oncology

## 2019-04-20 NOTE — Telephone Encounter (Signed)
Received a new patient referral from Dr. Barry Dienes for breast cancer. Pt has been cld and scheduled to see Dr. Lindi Adie on 7/20 at 345pm. Aware to arrive 20 minutes early.

## 2019-04-21 ENCOUNTER — Other Ambulatory Visit: Payer: Self-pay | Admitting: General Surgery

## 2019-04-21 DIAGNOSIS — C50411 Malignant neoplasm of upper-outer quadrant of right female breast: Secondary | ICD-10-CM

## 2019-04-23 NOTE — Progress Notes (Signed)
[facility] CONSULT NOTE  Patient Care Team: Nolene Ebbs, MD as PCP - General (Internal Medicine)  CHIEF COMPLAINTS/PURPOSE OF CONSULTATION:  Newly diagnosed breast cancer  HISTORY OF PRESENTING ILLNESS:  Kathy Reese 68 y.o. female is here because of recent diagnosis of invasive ductal carcinoma of the right breast. The cancer was detected on a routine screening mammogram on 03/29/19 and was not palpable prior to diagnosis. Diagnostic mammogram and [place] on 04/05/19 showed 2 indeterminate masses in the right breast, measuring 58m at the 1 o'clock position and 713mat the 12 o'clock position, with no axillary adenopathy. Biopsy on 04/12/19 showed invasive ductal carcinoma, grade 3, HER-2 positive (3+), ER 80%, PR negative, Ki67 40%. She presents to the clinic today for initial evaluation.  She appears to be extremely stressed out with hypertension and anxiety.  Once I got her daughter on a WebEx conference, she relaxed quite a bit.  I reviewed her records extensively and collaborated the history with the patient.  SUMMARY OF ONCOLOGIC HISTORY: Oncology History  Malignant neoplasm of upper-inner quadrant of right breast in female, estrogen receptor positive ([facility] 04/12/2019 Cancer Staging   Staging form: Breast, AJCC 8th Edition - Clinical stage from 04/12/2019: Stage IA (cT1b, cN0, cM0, G3, ER+, PR-, HER2+) - Signed by CaGardenia PhlegmNP on 04/19/2019   04/19/2019 Initial Diagnosis   Screening mammogram detected right breast asymmetry. Diagnostic mammogram [place] 2 indeterm[place] right breast masses, 63m10mt 1 o'clock, 7mm39m 12 o'clock, with no axillary adenopathy. 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      MEDICAL HISTORY:  Past Medical History:  Diagnosis Date  . Allergic rhinitis, cause unspecified   . Arthritis   . Depression   . Diverticulosis   . Family history of anesthesia complication    NIECE HAD  DIFFICULTY WAKING UP  . History of Bell's palsy   . Hypersomnia   . OSA on CPAP    ? what yr or exactly when  . Other and unspecified hyperlipidemia   . Personal history of malignant neoplasm of ovary   . PONV (postoperative nausea and vomiting)   . Type II or unspecified type diabetes mellitus without mention of complication, not stated as uncontrolled   . Unspecified essential hypertension     SURGICAL HISTORY: Past Surgical History:  Procedure Laterality Date  . ABDOMINAL ADHESION SURGERY  12/96   exc peritoneal cyst  . BREAST BIOPSY Left 2000   stereotactic, negative  . BREAST REDUCTION SURGERY Bilateral 10/03  . EYE SURGERY Left    cataracts  . LAPAROSCOPIC SALPINGO OOPHERECTOMY Right 12/96  . 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 LAPAROSCOPY ABDOMEN DIAGNOSTIC  2/08   w/LOA, LSO  . REDUCTION MAMMAPLASTY Bilateral 2004  . TONSILLECTOMY    . TOTAL ABDOMINAL HYSTERECTOMY W/ BILATERAL SALPINGOOPHORECTOMY  5/94   secondary to uterine fibroids  . TOTAL KNEE ARTHROPLASTY Right 05/12/2013   Procedure: TOTAL KNEE ARTHROPLASTY;  Surgeon: JohnAlta Corning;  [facility];  Laterality: Right;  . TRIGGER FINGER RELEASE  07/06/2012   Procedure: RELEASE TRIGGER FINGER/A-1 PULLEY;  Surgeon: JohnAlta Corning;  [facility];  Laterality: Left;  left ring finger  . TUBAL LIGATION      SOCIAL HISTORY: Social History   Socioeconomic History  . Marital status: Married  Spouse name: Not on file  . Number of children: 3  . Years of education: Not on file  . Highest education level: Not on file  Occupational History  . Occupation: Fish farm manager REP    Employer: [employer]  Social Needs  . Financial resource strain: Not on file  . Food insecurity    Worry: Not on file    Inability: Not on file  . Transportation needs    Medical: Not on file     Non-medical: Not on file  Tobacco Use  . Smoking status: Never Smoker  . Smokeless tobacco: Never Used  Substance and Sexual Activity  . Alcohol use: No  . Drug use: No  . Sexual activity: Not Currently    Partners: Male    Birth control/protection: Abstinence  Lifestyle  . Physical activity    Days per week: Not on file    Minutes per session: Not on file  . Stress: Not on file  Relationships  . Social Herbalist on phone: Not on file    Gets together: Not on file    Attends religious service: Not on file    Active member of club or organization: Not on file    Attends meetings of clubs or organizations: Not on file    Relationship status: Not on file  . Intimate partner violence    Fear of current or ex partner: Not on file    Emotionally abused: Not on file    Physically abused: Not on file    Forced sexual activity: Not on file  Other Topics Concern  . Not on file  Social History Narrative  . Not on file    FAMILY HISTORY: Family History  Problem Relation Age of Onset  . Lung cancer Mother   . Colon cancer Father   . Stroke Brother   . Stroke Sister   . Pulmonary Hypertension Sister     ALLERGIES:  has No Known Allergies.  MEDICATIONS:  Current Outpatient Medications  Medication Sig Dispense Refill  . aspirin 81 MG tablet Take 81 mg by mouth daily.     Marland Kitchen atorvastatin (LIPITOR) 40 MG tablet Take 40 mg by mouth daily at 6 PM.     . Calcium Carbonate (CALTRATE 600 PO) Take 1 tablet by mouth 2 (two) times daily.    . cholecalciferol (VITAMIN D3) 25 MCG (1000 UT) tablet Take 1,000 Units by mouth daily.    Marland Kitchen CINNAMON PO Take 1,000 mg by mouth daily.    . insulin detemir (LEVEMIR) 100 UNIT/ML injection Inject 15 Units into the skin 2 (two) times daily.     Marland Kitchen losartan-hydrochlorothiazide (HYZAAR) 100-25 MG tablet Take 1 tablet by mouth daily.  4  . metFORMIN (GLUCOPHAGE) 500 MG tablet Take 500 mg by mouth every morning.     . metoprolol (TOPROL-XL) 50 MG  24 hr tablet Take 50 mg by mouth daily.       No current facility-administered medications for this visit.     REVIEW OF SYSTEMS:   Constitutional: Denies fevers, chills or abnormal night sweats Eyes: Denies blurriness of vision, double vision or watery eyes Ears, nose, mouth, throat, and face: Denies mucositis or sore throat Respiratory: Denies cough, dyspnea or wheezes Cardiovascular: Denies palpitation, chest discomfort or lower extremity swelling Gastrointestinal:  Denies nausea, heartburn or change in bowel habits Skin: Denies abnormal skin rashes Lymphatics: Denies new lymphadenopathy or easy bruising Neurological:Denies numbness, tingling or new weaknesses Behavioral/Psych: Mood  is stable, no new changes  Breast: Denies any palpable lumps or discharge All other systems were reviewed with the patient and are negative.  PHYSICAL EXAMINATION: ECOG PERFORMANCE STATUS: 1 - Symptomatic but completely ambulatory  Vitals:   04/24/19 1552  BP: (!) 185/64  Pulse: 62  Resp: 18  Temp: (!) 97.1 F (36.2 C)  SpO2: 100%   Filed Weights   04/24/19 1552  Weight: 213 lb 4.8 oz (96.8 kg)    GENERAL:alert, no distress and comfortable SKIN: skin color, texture, turgor are normal, no rashes or significant lesions EYES: normal, conjunctiva are pink and non-injected, sclera clear OROPHARYNX:no exudate, no erythema and lips, buccal mucosa, and tongue normal  NECK: supple, thyroid normal size, non-tender, without nodularity LYMPH:  no palpable lymphadenopathy in the cervical, axillary or inguinal LUNGS: clear to auscultation and percussion with normal breathing effort HEART: regular rate & rhythm and no murmurs and no lower extremity edema ABDOMEN:abdomen soft, non-tender and normal bowel sounds Musculoskeletal:no cyanosis of digits and no clubbing  PSYCH: alert & oriented x 3 with fluent speech, stressed from diagnosis NEURO: no focal motor/sensory deficits BREAST: No palpable nodules  in breast. No palpable axillary or supraclavicular lymphadenopathy (exam performed in the presence of a chaperone)   LABORATORY DATA:  I have reviewed the data as listed Lab Results  Component Value Date   WBC 10.6 (H) 08/24/2018   HGB 13.3 08/24/2018   HCT 42.2 08/24/2018   MCV 90.9 08/24/2018   PLT 211 08/24/2018   Lab Results  Component Value Date   NA 136 08/24/2018   K 3.5 08/24/2018   CL 102 08/24/2018   CO2 24 08/24/2018    RADIOGRAPHIC STUDIES: I have personally reviewed the radiological reports and agreed with the findings in the report.  ASSESSMENT AND PLAN:  Malignant neoplasm of upper-inner quadrant of right breast in female, estrogen receptor positive ([place]) 04/19/2019:Screening mammogram detected right breast asymmetry. Diagnostic mammogram and US showed 2 indeterminate right breast masses, 45m at 1 o'clock, 770mat 12 o'clock, with no axillary adenopathy. 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  Stage Ia  Pathology and radiology counseling:Discussed with the patient, the details of pathology including the type of breast cancer,the clinical staging, the significance of ER, PR and HER-2/neu receptors and the implications for treatment. After reviewing the pathology in detail, we proceeded to discuss the different treatment options between surgery, radiation, chemotherapy, antiestrogen therapies.  Recommendations: 1. Breast conserving surgery followed by 2.  Adjuvant chemo with Taxol Herceptin if the final tumor size is more than 5 mm 3. Adjuvant radiation therapy followed by 4. Adjuvant antiestrogen therapy  We discussed the pros and cons of putting a port during her surgery.  We decided to hold off on inserting the port and will wait for the final pathology before proceeding for that.  Return to clinic after surgery to discuss final pathology report and then determine adjuvant treatment plan.  We can do a [employer] our WebEx visit  to discuss the final path.   All questions were answered. The patient knows to call the clinic with any problems, questions or concerns.   ViRulon EisenmengerMD 04/24/2019    I, Molly Dorshimer, am acting as scribe for ViNicholas LoseMD.  I have reviewed the above documentation for accuracy and completeness, and I agree with the above.

## 2019-04-24 ENCOUNTER — Inpatient Hospital Stay: Payer: Medicare Other | Attending: Hematology and Oncology | Admitting: Hematology and Oncology

## 2019-04-24 ENCOUNTER — Other Ambulatory Visit: Payer: Self-pay

## 2019-04-24 DIAGNOSIS — Z170 Estrogen receptor positive status [ER+]: Secondary | ICD-10-CM | POA: Diagnosis not present

## 2019-04-24 DIAGNOSIS — Z801 Family history of malignant neoplasm of trachea, bronchus and lung: Secondary | ICD-10-CM | POA: Diagnosis not present

## 2019-04-24 DIAGNOSIS — C50211 Malignant neoplasm of upper-inner quadrant of right female breast: Secondary | ICD-10-CM | POA: Diagnosis not present

## 2019-04-24 DIAGNOSIS — Z800 Family history of malignant neoplasm of digestive organs: Secondary | ICD-10-CM | POA: Diagnosis not present

## 2019-04-24 NOTE — Assessment & Plan Note (Addendum)
04/19/2019:Screening mammogram detected right breast asymmetry. Diagnostic mammogram and US showed 2 indeterminate right breast masses, 69m at 1 o'clock, 726mat 12 o'clock, with no axillary adenopathy. 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  Stage Ia  Pathology and radiology counseling:Discussed with the patient, the details of pathology including the type of breast cancer,the clinical staging, the significance of ER, PR and HER-2/neu receptors and the implications for treatment. After reviewing the pathology in detail, we proceeded to discuss the different treatment options between surgery, radiation, chemotherapy, antiestrogen therapies.  Recommendations: 1. Breast conserving surgery followed by 2.  Adjuvant chemo with Taxol Herceptin if the final tumor size is more than 5 mm 3. Adjuvant radiation therapy followed by 4. Adjuvant antiestrogen therapy   Return to clinic after surgery to discuss final pathology report and then determine adjuvant treatment plan.

## 2019-04-25 ENCOUNTER — Telehealth: Payer: Self-pay | Admitting: *Deleted

## 2019-04-25 NOTE — Telephone Encounter (Signed)
Spoke to pt concerning new pt appt with Dr. Lindi Adie and Dr. Barry Dienes. Denies questions or concerns regarding dx or treatment care plan. Discussed next steps. Encourage pt to call with needs. Received verbal understanding.

## 2019-04-27 ENCOUNTER — Other Ambulatory Visit: Payer: Self-pay

## 2019-04-27 ENCOUNTER — Ambulatory Visit
Admission: RE | Admit: 2019-04-27 | Discharge: 2019-04-27 | Disposition: A | Discharge status: Home / Self Care | Payer: Medicare Other | Source: Ambulatory Visit | Attending: General Surgery | Admitting: General Surgery

## 2019-04-27 DIAGNOSIS — C50411 Malignant neoplasm of upper-outer quadrant of right female breast: Secondary | ICD-10-CM

## 2019-04-27 DIAGNOSIS — Z170 Estrogen receptor positive status [ER+]: Secondary | ICD-10-CM

## 2019-04-27 MED ORDER — GADOBUTROL 1 MMOL/ML IV SOLN
10.0000 mL | Freq: Once | INTRAVENOUS | Status: AC | PRN
  Administered 2019-04-27: 10 mL via INTRAVENOUS

## 2019-05-02 ENCOUNTER — Other Ambulatory Visit: Payer: Self-pay | Admitting: General Surgery

## 2019-05-02 ENCOUNTER — Encounter: Payer: Self-pay | Admitting: *Deleted

## 2019-05-02 ENCOUNTER — Telehealth: Payer: Self-pay

## 2019-05-02 ENCOUNTER — Telehealth: Payer: Self-pay | Admitting: Hematology and Oncology

## 2019-05-02 DIAGNOSIS — R928 Other abnormal and inconclusive findings on diagnostic imaging of breast: Secondary | ICD-10-CM

## 2019-05-02 DIAGNOSIS — C50411 Malignant neoplasm of upper-outer quadrant of right female breast: Secondary | ICD-10-CM

## 2019-05-02 DIAGNOSIS — Z170 Estrogen receptor positive status [ER+]: Secondary | ICD-10-CM

## 2019-05-02 NOTE — Telephone Encounter (Signed)
Scheduled appt per 7/28 sch message - unable to reach pt . Left message with appt date and time

## 2019-05-02 NOTE — Telephone Encounter (Signed)
RN returned call, left voicemail for patient to call back.

## 2019-05-03 ENCOUNTER — Other Ambulatory Visit: Payer: Medicare Other

## 2019-05-04 ENCOUNTER — Telehealth: Payer: Self-pay | Admitting: *Deleted

## 2019-05-04 ENCOUNTER — Telehealth: Payer: Self-pay | Admitting: Hematology and Oncology

## 2019-05-04 NOTE — Telephone Encounter (Signed)
Scheduled appt per 7/30 sch message - pt aware of new appt date and time

## 2019-05-04 NOTE — Telephone Encounter (Signed)
Left message for a return phone call concerning patient's surgery and placement of port.

## 2019-05-05 ENCOUNTER — Other Ambulatory Visit (HOSPITAL_COMMUNITY): Payer: Medicare Other

## 2019-05-06 ENCOUNTER — Other Ambulatory Visit (HOSPITAL_COMMUNITY): Payer: Medicare Other

## 2019-05-08 ENCOUNTER — Other Ambulatory Visit: Payer: Self-pay

## 2019-05-08 ENCOUNTER — Ambulatory Visit
Admission: RE | Admit: 2019-05-08 | Discharge: 2019-05-08 | Disposition: A | Discharge status: Home / Self Care | Payer: Medicare Other | Source: Ambulatory Visit | Attending: General Surgery | Admitting: General Surgery

## 2019-05-08 DIAGNOSIS — R928 Other abnormal and inconclusive findings on diagnostic imaging of breast: Secondary | ICD-10-CM

## 2019-05-08 HISTORY — PX: BREAST BIOPSY: SHX20

## 2019-05-08 MED ORDER — GADOBUTROL 1 MMOL/ML IV SOLN
10.0000 mL | Freq: Once | INTRAVENOUS | Status: AC | PRN
  Administered 2019-05-08: 10 mL via INTRAVENOUS

## 2019-05-10 ENCOUNTER — Encounter (HOSPITAL_COMMUNITY): Payer: Medicare Other

## 2019-05-18 ENCOUNTER — Ambulatory Visit: Payer: Medicare Other | Admitting: Hematology and Oncology

## 2019-05-24 NOTE — Progress Notes (Signed)
[facility] ([address] [place] [address] [place] ([place]) [place] [address] Phone: [phone] Fax: [phone]      Your procedure is scheduled on August 25  Report to [facility] Main Entrance "A" at 1200 P.M., and check in at the Admitting office.  Call this number if you have problems the morning of surgery:  [phone]  Call [phone] if you have any questions prior to your surgery date Monday-Friday 8am-4pm    Remember:  Do not drink after midnight the night before your surgery  You may drink clear liquids until 1100 am the morning of your surgery.   Clear liquids allowed are: Water, Non-Citrus Juices (without pulp), Carbonated Beverages, Clear Tea, Black Coffee Only, and Gatorade    Take these medicines the morning of surgery with A SIP OF WATER  citalopram (CELEXA) metoprolol (TOPROL-XL)   Follow your surgeon's instructions on when to stop Aspirin.  If no instructions were given by your surgeon then you will need to call the office to get those instructions.    7 days prior to surgery STOP taking any meloxicam (MOBIC), Aleve, Naproxen, Ibuprofen, Motrin, Advil, Goody's, BC's, all herbal medications, fish oil, and all vitamins.     WHAT DO I DO ABOUT MY DIABETES MEDICATION?   Marland Kitchen Do not take oral diabetes medicines (pills) the morning of surgery.  metFORMIN (GLUCOPHAGE  . THE NIGHT BEFORE SURGERY, take _____6______ units of ___insulin detemir (LEVEMIR)________insulin.       . THE MORNING OF SURGERY, take _____7________ units of ___insulin detemir (LEVEMIR)_______insulin.   How to Manage Your Diabetes Before and After Surgery  Why is it important to control my blood sugar before and after surgery? . Improving blood sugar levels before and after surgery helps healing and can limit problems. . A way of improving blood sugar control is eating a healthy diet by: o  Eating less sugar and carbohydrates o  Increasing  activity/exercise o  Talking with your doctor about reaching your blood sugar goals . High blood sugars (greater than 180 mg/dL) can raise your risk of infections and slow your recovery, so you will need to focus on controlling your diabetes during the weeks before surgery. . Make sure that the doctor who takes care of your diabetes knows about your planned surgery including the date and location.  How do I manage my blood sugar before surgery? . Check your blood sugar at least 4 times a day, starting 2 days before surgery, to make sure that the level is not too high or low. o Check your blood sugar the morning of your surgery when you wake up and every 2 hours until you get to the Short Stay unit. . If your blood sugar is less than 70 mg/dL, you will need to treat for low blood sugar: o Do not take insulin. o Treat a low blood sugar (less than 70 mg/dL) with  cup of clear juice (cranberry or apple), 4 glucose tablets, OR glucose gel. o Recheck blood sugar in 15 minutes after treatment (to make sure it is greater than 70 mg/dL). If your blood sugar is not greater than 70 mg/dL on recheck, call [phone] for further instructions. . Report your blood sugar to the short stay nurse when you get to Short Stay.  . If you are admitted to the hospital after surgery: o Your blood sugar will be checked by the staff and you will probably be given insulin after surgery (  instead of oral diabetes medicines) to make sure you have good blood sugar levels. o The goal for blood sugar control after surgery is 80-180 mg/dL.  The Morning of Surgery  Do not wear jewelry, make-up or nail polish.  Do not wear lotions, powders, or perfumes/colognes, or deodorant  Do not shave 48 hours prior to surgery.  Men may shave face and neck.  Do not bring valuables to the hospital.  [facility] is not responsible for any belongings or valuables.  If you are a smoker, DO NOT Smoke 24 hours prior to surgery IF you wear a  CPAP at night please bring your mask, tubing, and machine the morning of surgery   Remember that you must have someone to transport you home after your surgery, and remain with you for 24 hours if you are discharged the same day.   Contacts, glasses, hearing aids, dentures or bridgework may not be worn into surgery.    Leave your suitcase in the car.  After surgery it may be brought to your room.  For patients admitted to the hospital, discharge time will be determined by your treatment team.  Patients discharged the day of surgery will not be allowed to drive home.    Special instructions:   [place]- Preparing For Surgery  Before surgery, you can play an important role. Because skin is not sterile, your skin needs to be as free of germs as possible. You can reduce the number of germs on your skin by washing with CHG (chlorahexidine gluconate) Soap before surgery.  CHG is an antiseptic cleaner which kills germs and bonds with the skin to continue killing germs even after washing.    Oral Hygiene is also important to reduce your risk of infection.  Remember - BRUSH YOUR TEETH THE MORNING OF SURGERY WITH YOUR REGULAR TOOTHPASTE  Please do not use if you have an allergy to CHG or antibacterial soaps. If your skin becomes reddened/irritated stop using the CHG.  Do not shave (including legs and underarms) for at least 48 hours prior to first CHG shower. It is OK to shave your face.  Please follow these instructions carefully.   1. Shower the NIGHT BEFORE SURGERY and the MORNING OF SURGERY with CHG Soap.   2. If you chose to wash your hair, wash your hair first as usual with your normal shampoo.  3. After you shampoo, rinse your hair and body thoroughly to remove the shampoo.  4. Use CHG as you would any other liquid soap. You can apply CHG directly to the skin and wash gently with a scrungie or a clean washcloth.   5. Apply the CHG Soap to your body ONLY FROM THE NECK DOWN.  Do not  use on open wounds or open sores. Avoid contact with your eyes, ears, mouth and genitals (private parts). Wash Face and genitals (private parts)  with your normal soap.   6. Wash thoroughly, paying special attention to the area where your surgery will be performed.  7. Thoroughly rinse your body with warm water from the neck down.  8. DO NOT shower/wash with your normal soap after using and rinsing off the CHG Soap.  9. Pat yourself dry with a CLEAN TOWEL.  10. Wear CLEAN PAJAMAS to bed the night before surgery, wear comfortable clothes the morning of surgery  11. Place CLEAN SHEETS on your bed the night of your first shower and DO NOT SLEEP WITH PETS.    Day of Surgery:  Do not apply any deodorants/lotions. Please shower the morning of surgery with the CHG soap  Please wear clean clothes to the hospital/surgery center.   Remember to brush your teeth WITH YOUR REGULAR TOOTHPASTE.   Please read over the following fact sheets that you were given.

## 2019-05-25 ENCOUNTER — Encounter (HOSPITAL_COMMUNITY)
Admission: RE | Admit: 2019-05-25 | Discharge: 2019-05-25 | Disposition: A | Discharge status: Home / Self Care | Payer: Medicare Other | Source: Ambulatory Visit | Attending: General Surgery | Admitting: General Surgery

## 2019-05-25 ENCOUNTER — Other Ambulatory Visit: Payer: Self-pay

## 2019-05-25 ENCOUNTER — Encounter (HOSPITAL_COMMUNITY): Payer: Self-pay

## 2019-05-25 DIAGNOSIS — Z20828 Contact with and (suspected) exposure to other viral communicable diseases: Secondary | ICD-10-CM | POA: Diagnosis not present

## 2019-05-25 DIAGNOSIS — Z01812 Encounter for preprocedural laboratory examination: Secondary | ICD-10-CM | POA: Diagnosis not present

## 2019-05-25 HISTORY — DX: Dizziness and giddiness: R42

## 2019-05-25 LAB — BASIC METABOLIC PANEL
Anion gap: 10 (ref 5–15)
BUN: 20 mg/dL (ref 8–23)
CO2: 25 mmol/L (ref 22–32)
Calcium: 9.5 mg/dL (ref 8.9–10.3)
Chloride: 104 mmol/L (ref 98–111)
Creatinine, Ser: 0.86 mg/dL (ref 0.44–1.00)
GFR calc Af Amer: >60 mL/min (ref >60)
GFR calc non Af Amer: >60 mL/min (ref >60)
Glucose, Bld: 139 mg/dL — ABNORMAL HIGH (ref 70–99)
Potassium: 4 mmol/L (ref 3.5–5.1)
Sodium: 139 mmol/L (ref 135–145)

## 2019-05-25 LAB — CBC
HCT: 39.5 % (ref 36.0–46.0)
Hemoglobin: 12.6 g/dL (ref 12.0–15.0)
MCH: 29.4 pg (ref 26.0–34.0)
MCHC: 31.9 g/dL (ref 30.0–36.0)
MCV: 92.1 fL (ref 80.0–100.0)
Platelets: 185 10*3/uL (ref 150–400)
RBC: 4.29 MIL/uL (ref 3.87–5.11)
RDW: 13.6 % (ref 11.5–15.5)
WBC: 6.5 10*3/uL (ref 4.0–10.5)
nRBC: 0 % (ref 0.0–0.2)

## 2019-05-25 LAB — GLUCOSE, CAPILLARY: Glucose-Capillary: 101 mg/dL — ABNORMAL HIGH (ref 70–99)

## 2019-05-25 NOTE — Progress Notes (Signed)
PCP - Nolene Ebbs Cardiologist - denies  Chest x-ray - n/a EKG - 08-24-18  SA - yes, does not wear CPAP   DM - type 2 Fasting Blood Sugar - 100-120   Aspirin Instructions: pt stated she has already stopped taking ASA for DOS  Anesthesia review: n/a  Patient denies shortness of breath, fever, cough and chest pain at PAT appointment   Patient verbalized understanding of instructions that were given to them at the PAT appointment. Patient was also instructed that they will need to review over the PAT instructions again at home before surgery.

## 2019-05-26 ENCOUNTER — Other Ambulatory Visit (HOSPITAL_COMMUNITY)
Admission: RE | Admit: 2019-05-26 | Discharge: 2019-05-26 | Disposition: A | Discharge status: Home / Self Care | Payer: Medicare Other | Source: Ambulatory Visit | Attending: General Surgery | Admitting: General Surgery

## 2019-05-26 DIAGNOSIS — Z01812 Encounter for preprocedural laboratory examination: Secondary | ICD-10-CM | POA: Diagnosis not present

## 2019-05-26 LAB — SARS CORONAVIRUS 2 (TAT 6-24 HRS): SARS Coronavirus 2: NEGATIVE

## 2019-05-29 NOTE — H&P (View-Only) (Signed)
 Kathy Reese Location: [facility] Patient #: [id number] DOB: 09/05/1951 Married / Language: English / Race: Black or African American Female   History of Present Illness (Olof Marcil MD; 05/08/2019 2:03 PM) The patient is a 67 year old female who presents with breast cancer. Pt is a 67 yo F who is referred for a dx of new breast cancer 04/2019 by Dr. Chacko. She was found to have screening detected right breast asymmetry. Diagnostic imaging determined that these were masses. One was 6 mm at 1 o'clock. The other was 7 mm at 12 o'clock. 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 She denies prior history of breast cancer. She is G4P3 with first child at in late teens and menarche at age 13. She denies breast pain or breast trauma.    pathology 04/12/2019 Diagnosis 1. Breast, right, needle core biopsy, 12 o'clock, 5 cm fn (ribbon) - INVASIVE DUCTAL CARCINOMA, SEE COMMENT. 2. Breast, right, needle core biopsy, 1 o'clock, 7 cm fn (coil) - INVASIVE DUCTAL CARCINOMA, SEE COMMENT. Microscopic Comment 1. The carcinoma appears grade 3 and measures 6 mm in greatest linear extent. Prognostic makers will be ordered. Dr. Kish has reviewed the case. The case was called to [facility] on 04/13/2019. 2. The carcinoma appears grade 2-3 and measures 7 mm in greatest extent. Parts 1 and 2 have a similar morphology. IMMUNOHISTOCHEMICAL AND MORPHOMETRIC ANALYSIS PERFORMED MANUALLY The tumor cells are POSTIVE for Her2 (3+). Estrogen Receptor: 80%, POSITIVE, MODERATE STAINING INTENSITY Progesterone Receptor: 0%, NEGATIVE Proliferation Marker Ki67: 40%  dx mammogrma on right 04/05/2019 CLINICAL DATA: The patient was called back for asymmetries in the superior right breast.  EXAM: DIGITAL DIAGNOSTIC RIGHT MAMMOGRAM WITH CAD AND TOMO  ULTRASOUND RIGHT BREAST  COMPARISON: Previous exam(s).  ACR Breast Density  Category c: The breast tissue is heterogeneously dense, which may obscure small masses.  FINDINGS: The asymmetries in the superior right breast, only well seen on the MLO view, persists on additional imaging. There are adjacent oil cysts raising the possibility of fat necrosis.  Mammographic images were processed with CAD.  On physical exam, no suspicious lumps are identified.  Targeted ultrasound is performed, showing 2 masses in the superior right breast. The first mass is seen at 1 o'clock, 7 cm from the nipple measuring 5 6 x 3 mm. The second mass which is irregular in shape is seen at 12 o'clock, 5 cm from the nipple measures 7 by 6 x 5 mm. No axillary adenopathy.  IMPRESSION: There are 2 indeterminate masses in the superior right breast seen with ultrasound, at 12 o'clock and 1 o'clock. These likely correlate with the right breast asymmetries. No other suspicious findings.  RECOMMENDATION: Recommend ultrasound-guided biopsy of both the 12 o'clock and 1 o'clock right breast masses. Recommend clip placement and follow-up mammography to ensure the mammographic and sonographic findings correlate.  I have discussed the findings and recommendations with the patient. Results were also provided in writing at the conclusion of the visit. If applicable, a reminder letter will be sent to the patient regarding the next appointment.  BI-RADS CATEGORY 4: Suspicious.   Allergies  No Known Drug Allergies   Medication History  Aspirin (81MG Tablet, Oral) Active. Atorvastatin Calcium (40MG Tablet, Oral) Active. Vitamin D (1000UNIT Tablet, Oral) Active. Cinnamon (Oral) Specific strength unknown - Active. Insulin Detemir (100UNIT/ML Solution, Subcutaneous) Active. metFORMIN HCl (500MG Tablet, Oral) Active. Metoprolol Succinate ER (50MG Tablet ER   24HR, Oral) Active. Medications Reconciled    Review of Systems (Kirstina Leinweber MD; 05/08/2019 2:03 PM) All other systems  negative  Vitals  Weight: 213.5 lb Height: 62in Body Surface Area: 1.97 m Body Mass Index: 39.05 kg/m  Temp.: 96.7F (Oral)  Pulse: 53 (Regular)  P.OX: 98% (Room air) BP: 140/80(Sitting, Left Arm, Standard)       Physical Exam  General Mental Status-Alert. General Appearance-Consistent with stated age. Hydration-Well hydrated. Voice-Normal.  Head and Neck Head-normocephalic, atraumatic with no lesions or palpable masses. Trachea-midline. Thyroid Gland Characteristics - normal size and consistency.  Eye Eyeball - Bilateral-Extraocular movements intact. Sclera/Conjunctiva - Bilateral-No scleral icterus.  Chest and Lung Exam Chest and lung exam reveals -quiet, even and easy respiratory effort with no use of accessory muscles and on auscultation, normal breath sounds, no adventitious sounds and normal vocal resonance. Inspection Chest Wall - Normal. Back - normal.  Breast Note: no palpable masses. no skin dimpling. no nipple retraction or nipple discharge. No LAD. some bruising on right breast at biopsy site. breasts relatively symmetric.   Cardiovascular Cardiovascular examination reveals -normal heart sounds, regular rate and rhythm with no murmurs and normal pedal pulses bilaterally.  Abdomen Inspection Inspection of the abdomen reveals - No Hernias. Palpation/Percussion Palpation and Percussion of the abdomen reveal - Soft, Non Tender, No Rebound tenderness, No Rigidity (guarding) and No hepatosplenomegaly. Auscultation Auscultation of the abdomen reveals - Bowel sounds normal.  Neurologic Neurologic evaluation reveals -alert and oriented x 3 with no impairment of recent or remote memory. Mental Status-Normal.  Musculoskeletal Global Assessment -Note: no gross deformities.  Normal Exam - Left-Upper Extremity Strength Normal and Lower Extremity Strength Normal. Normal Exam - Right-Upper Extremity Strength Normal  and Lower Extremity Strength Normal.  Lymphatic Head & Neck  General Head & Neck Lymphatics: Bilateral - Description - Normal. Axillary  General Axillary Region: Bilateral - Description - Normal. Tenderness - Non Tender. Femoral & Inguinal  Generalized Femoral & Inguinal Lymphatics: Bilateral - Description - No Generalized lymphadenopathy.    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 Assuming no additional lesions, will plan seed bracketed lumpectomy, sentinel lymph node biopsy, and port placement. I will have her see oncology pre op to discuss chemotherapy plan.  The surgical procedure was described to the patient. I discussed the incision type and location and that we would need radiology involved on with a wire or seed marker and/or sentinel node.  The risks and benefits of the procedure were described to the patient and she wishes to proceed.  We discussed the risks bleeding, infection, damage to other structures, need for further procedures/surgeries. We discussed the risk of seroma. The patient was advised if the area in the breast in cancer, we may need to go back to surgery for additional tissue to obtain negative margins or for a lymph node biopsy. The patient was advised that these are the most common complications, but that others can occur as well. They were advised against taking aspirin or other anti-inflammatory agents/blood thinners the week before surgery.  Current Plans MRI BREAST BILATERAL W CONTRAST ([id number]) (multifocal right breast cancer, her 2 positive) Referred to Oncology, for evaluation and follow up (Oncology). Urgent. Referred to Radiation Oncology, for evaluation and follow up (Radiation Oncology). Routine. Pt Education - [facility]b breast cancer surgery: discussed with patient and provided    information. Pt Education - ccs port insertion education   Signed by Tyreon Frigon, MD  

## 2019-05-29 NOTE — H&P (Signed)
Kathy Reese Location: [facility] Patient #: [id number] DOB: 12-23-1950 Married / Language: English / Race: Black or African American Female   History of Present Illness Kathy Klein MD; 05/08/2019 2:03 PM) The patient is a 68 year old female who presents with breast cancer. Pt is a 68 yo F who is referred for a dx of new breast cancer 04/2019 by Kathy Reese. She was found to have screening detected right breast asymmetry. Diagnostic imaging determined that these were masses. One was 6 mm at 1 o'clock. The other was 7 mm at 12 o'clock. 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 She denies prior history of breast cancer. She is G4P3 with first child at in late teens and menarche at age 51. She denies breast pain or breast trauma.    pathology 04/12/2019 Diagnosis 1. Breast, right, needle core biopsy, 12 o'clock, 5 cm fn (ribbon) - INVASIVE DUCTAL CARCINOMA, SEE COMMENT. 2. Breast, right, needle core biopsy, 1 o'clock, 7 cm fn (coil) - INVASIVE DUCTAL CARCINOMA, SEE COMMENT. Microscopic Comment 1. The carcinoma appears grade 3 and measures 6 mm in greatest linear extent. Prognostic makers will be ordered. Kathy Reese has reviewed the case. The case was called to [facility] on 04/13/2019. 2. The carcinoma appears grade 2-3 and measures 7 mm in greatest extent. Parts 1 and 2 have a similar morphology. IMMUNOHISTOCHEMICAL AND MORPHOMETRIC ANALYSIS PERFORMED MANUALLY The tumor cells are POSTIVE for Her2 (3+). Estrogen Receptor: 80%, POSITIVE, MODERATE STAINING INTENSITY Progesterone Receptor: 0%, NEGATIVE Proliferation Marker Ki67: 40%  dx mammogrma on right 04/05/2019 CLINICAL DATA: The patient was called back for asymmetries in the superior right breast.  EXAM: DIGITAL DIAGNOSTIC RIGHT MAMMOGRAM WITH CAD AND TOMO  ULTRASOUND RIGHT BREAST  COMPARISON: Previous exam(s).  ACR Breast Density  Category c: The breast tissue is heterogeneously dense, which may obscure small masses.  FINDINGS: The asymmetries in the superior right breast, only well seen on the MLO view, persists on additional imaging. There are adjacent oil cysts raising the possibility of fat necrosis.  Mammographic images were processed with CAD.  On physical exam, no suspicious lumps are identified.  Targeted ultrasound is performed, showing 2 masses in the superior right breast. The first mass is seen at 1 o'clock, 7 cm from the nipple measuring 5 6 x 3 mm. The second mass which is irregular in shape is seen at 12 o'clock, 5 cm from the nipple measures 7 by 6 x 5 mm. No axillary adenopathy.  IMPRESSION: There are 2 indeterminate masses in the superior right breast seen with ultrasound, at 12 o'clock and 1 o'clock. These likely correlate with the right breast asymmetries. No other suspicious findings.  RECOMMENDATION: Recommend ultrasound-guided biopsy of both the 12 o'clock and 1 o'clock right breast masses. Recommend clip placement and follow-up mammography to ensure the mammographic and sonographic findings correlate.  I have discussed the findings and recommendations with the patient. Results were also provided in writing at the conclusion of the visit. If applicable, a reminder letter will be sent to the patient regarding the next appointment.  BI-RADS CATEGORY 4: Suspicious.   Allergies  No Known Drug Allergies   Medication History  Aspirin (81MG Tablet, Oral) Active. Atorvastatin Calcium (40MG Tablet, Oral) Active. Vitamin D (1000UNIT Tablet, Oral) Active. Cinnamon (Oral) Specific strength unknown - Active. Insulin Detemir (100UNIT/ML Solution, Subcutaneous) Active. metFORMIN HCl (500MG Tablet, Oral) Active. Metoprolol Succinate ER (50MG Tablet ER  24HR, Oral) Active. Medications Reconciled    Review of Systems Kathy Klein MD; 05/08/2019 2:03 PM) All other systems  negative  Vitals  Weight: 213.5 lb Height: 62in Body Surface Area: 1.97 m Body Mass Index: 39.05 kg/m  Temp.: 96.39F (Oral)  Pulse: 53 (Regular)  P.OX: 98% (Room air) BP: 140/80(Sitting, Left Arm, Standard)       Physical Exam  General Mental Status-Alert. General Appearance-Consistent with stated age. Hydration-Well hydrated. Voice-Normal.  Head and Neck Head-normocephalic, atraumatic with no lesions or palpable masses. Trachea-midline. Thyroid Gland Characteristics - normal size and consistency.  Eye Eyeball - Bilateral-Extraocular movements intact. Sclera/Conjunctiva - Bilateral-No scleral icterus.  Chest and Lung Exam Chest and lung exam reveals -quiet, even and easy respiratory effort with no use of accessory muscles and on auscultation, normal breath sounds, no adventitious sounds and normal vocal resonance. Inspection Chest Wall - Normal. Back - normal.  Breast Note: no palpable masses. no skin dimpling. no nipple retraction or nipple discharge. No LAD. some bruising on right breast at biopsy site. breasts relatively symmetric.   Cardiovascular Cardiovascular examination reveals -normal heart sounds, regular rate and rhythm with no murmurs and normal pedal pulses bilaterally.  Abdomen Inspection Inspection of the abdomen reveals - No Hernias. Palpation/Percussion Palpation and Percussion of the abdomen reveal - Soft, Non Tender, No Rebound tenderness, No Rigidity (guarding) and No hepatosplenomegaly. Auscultation Auscultation of the abdomen reveals - Bowel sounds normal.  Neurologic Neurologic evaluation reveals -alert and oriented x 3 with no impairment of recent or remote memory. Mental Status-Normal.  Musculoskeletal Global Assessment -Note: no gross deformities.  Normal Exam - Left-Upper Extremity Strength Normal and Lower Extremity Strength Normal. Normal Exam - Right-Upper Extremity Strength Normal  and Lower Extremity Strength Normal.  Lymphatic Head & Neck  General Head & Neck Lymphatics: Bilateral - Description - Normal. Axillary  General Axillary Region: Bilateral - Description - Normal. Tenderness - Non Tender. Femoral & Inguinal  Generalized Femoral & Inguinal Lymphatics: Bilateral - Description - No Generalized lymphadenopathy.    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 Assuming no additional lesions, will plan seed bracketed lumpectomy, sentinel lymph node biopsy, and port placement. I will have her see oncology pre op to discuss chemotherapy plan.  The surgical procedure was described to the patient. I discussed the incision type and location and that we would need radiology involved on with a wire or seed marker and/or sentinel node.  The risks and benefits of the procedure were described to the patient and she wishes to proceed.  We discussed the risks bleeding, infection, damage to other structures, need for further procedures/surgeries. We discussed the risk of seroma. The patient was advised if the area in the breast in cancer, we may need to go back to surgery for additional tissue to obtain negative margins or for a lymph node biopsy. The patient was advised that these are the most common complications, but that others can occur as well. They were advised against taking aspirin or other anti-inflammatory agents/blood thinners the week before surgery.  Current Plans MRI BREAST BILATERAL W CONTRAST [phone]) (multifocal right breast cancer, her 2 positive) Referred to Oncology, for evaluation and follow up (Oncology). Urgent. Referred to Radiation Oncology, for evaluation and follow up (Radiation Oncology). Routine. Pt Education - flb breast cancer surgery: discussed with patient and provided  information. Pt Education - ccs port insertion education   Signed by Faera Byerly, MD  

## 2019-05-30 ENCOUNTER — Ambulatory Visit
Admission: RE | Admit: 2019-05-30 | Discharge: 2019-05-30 | Disposition: A | Discharge status: Home / Self Care | Payer: Medicare Other | Source: Ambulatory Visit | Attending: General Surgery | Admitting: General Surgery

## 2019-05-30 ENCOUNTER — Encounter (HOSPITAL_COMMUNITY): Payer: Self-pay

## 2019-05-30 ENCOUNTER — Ambulatory Visit (HOSPITAL_COMMUNITY)
Admission: RE | Admit: 2019-05-30 | Discharge: 2019-05-30 | Disposition: A | Discharge status: Home / Self Care | Payer: Medicare Other | Attending: General Surgery | Admitting: General Surgery

## 2019-05-30 ENCOUNTER — Encounter (HOSPITAL_COMMUNITY)
Admission: RE | Disposition: A | Discharge status: Home / Self Care | Payer: Self-pay | Source: Home / Self Care | Attending: General Surgery

## 2019-05-30 ENCOUNTER — Ambulatory Visit (HOSPITAL_COMMUNITY): Payer: Medicare Other | Admitting: Anesthesiology

## 2019-05-30 ENCOUNTER — Other Ambulatory Visit: Payer: Self-pay

## 2019-05-30 ENCOUNTER — Ambulatory Visit (HOSPITAL_COMMUNITY): Payer: Medicare Other

## 2019-05-30 ENCOUNTER — Encounter (HOSPITAL_COMMUNITY)
Admission: RE | Admit: 2019-05-30 | Discharge: 2019-05-30 | Disposition: A | Discharge status: Home / Self Care | Payer: Medicare Other | Source: Ambulatory Visit | Attending: General Surgery | Admitting: General Surgery

## 2019-05-30 DIAGNOSIS — Z7982 Long term (current) use of aspirin: Secondary | ICD-10-CM | POA: Insufficient documentation

## 2019-05-30 DIAGNOSIS — Z170 Estrogen receptor positive status [ER+]: Secondary | ICD-10-CM | POA: Insufficient documentation

## 2019-05-30 DIAGNOSIS — C50411 Malignant neoplasm of upper-outer quadrant of right female breast: Secondary | ICD-10-CM

## 2019-05-30 DIAGNOSIS — Z794 Long term (current) use of insulin: Secondary | ICD-10-CM | POA: Insufficient documentation

## 2019-05-30 DIAGNOSIS — M1990 Unspecified osteoarthritis, unspecified site: Secondary | ICD-10-CM | POA: Insufficient documentation

## 2019-05-30 DIAGNOSIS — E119 Type 2 diabetes mellitus without complications: Secondary | ICD-10-CM | POA: Insufficient documentation

## 2019-05-30 DIAGNOSIS — G4730 Sleep apnea, unspecified: Secondary | ICD-10-CM | POA: Diagnosis not present

## 2019-05-30 DIAGNOSIS — C50911 Malignant neoplasm of unspecified site of right female breast: Secondary | ICD-10-CM | POA: Diagnosis present

## 2019-05-30 DIAGNOSIS — I10 Essential (primary) hypertension: Secondary | ICD-10-CM | POA: Insufficient documentation

## 2019-05-30 DIAGNOSIS — Z79899 Other long term (current) drug therapy: Secondary | ICD-10-CM | POA: Diagnosis not present

## 2019-05-30 DIAGNOSIS — C50211 Malignant neoplasm of upper-inner quadrant of right female breast: Secondary | ICD-10-CM | POA: Insufficient documentation

## 2019-05-30 HISTORY — PX: BREAST LUMPECTOMY WITH RADIOACTIVE SEED AND SENTINEL LYMPH NODE BIOPSY: SHX6550

## 2019-05-30 HISTORY — PX: BREAST LUMPECTOMY: SHX2

## 2019-05-30 LAB — GLUCOSE, CAPILLARY: Glucose-Capillary: 131 mg/dL — ABNORMAL HIGH (ref 70–99)

## 2019-05-30 SURGERY — BREAST LUMPECTOMY WITH RADIOACTIVE SEED AND SENTINEL LYMPH NODE BIOPSY
Anesthesia: General | Site: Breast | Laterality: Right

## 2019-05-30 MED ORDER — LACTATED RINGERS IV SOLN
INTRAVENOUS | Status: DC
  Administered 2019-05-30 (×2): via INTRAVENOUS

## 2019-05-30 MED ORDER — ACETAMINOPHEN 500 MG PO TABS
1000.0000 mg | ORAL_TABLET | ORAL | Status: AC
  Administered 2019-05-30: 1000 mg via ORAL
  Filled 2019-05-30: qty 2

## 2019-05-30 MED ORDER — GABAPENTIN 300 MG PO CAPS
300.0000 mg | ORAL_CAPSULE | ORAL | Status: AC
  Administered 2019-05-30: 300 mg via ORAL
  Filled 2019-05-30: qty 1

## 2019-05-30 MED ORDER — LIDOCAINE HCL (PF) 1 % IJ SOLN
INTRAMUSCULAR | Status: AC
  Filled 2019-05-30: qty 30

## 2019-05-30 MED ORDER — ONDANSETRON HCL 4 MG/2ML IJ SOLN
INTRAMUSCULAR | Status: DC | PRN
  Administered 2019-05-30: 4 mg via INTRAVENOUS

## 2019-05-30 MED ORDER — LIDOCAINE 2% (20 MG/ML) 5 ML SYRINGE
INTRAMUSCULAR | Status: AC
  Filled 2019-05-30: qty 10

## 2019-05-30 MED ORDER — METHYLENE BLUE 0.5 % INJ SOLN
INTRAVENOUS | Status: AC
  Filled 2019-05-30: qty 10

## 2019-05-30 MED ORDER — BUPIVACAINE-EPINEPHRINE (PF) 0.25% -1:200000 IJ SOLN
INTRAMUSCULAR | Status: AC
  Filled 2019-05-30: qty 30

## 2019-05-30 MED ORDER — FENTANYL CITRATE (PF) 100 MCG/2ML IJ SOLN: 25.0000 ug | INTRAMUSCULAR | Status: DC | PRN

## 2019-05-30 MED ORDER — DEXAMETHASONE SODIUM PHOSPHATE 10 MG/ML IJ SOLN
INTRAMUSCULAR | Status: AC
  Filled 2019-05-30: qty 1

## 2019-05-30 MED ORDER — DEXAMETHASONE SODIUM PHOSPHATE 10 MG/ML IJ SOLN
INTRAMUSCULAR | Status: DC | PRN
  Administered 2019-05-30: 10 mg via INTRAVENOUS

## 2019-05-30 MED ORDER — ROCURONIUM BROMIDE 10 MG/ML (PF) SYRINGE
PREFILLED_SYRINGE | INTRAVENOUS | Status: AC
  Filled 2019-05-30: qty 10

## 2019-05-30 MED ORDER — EPHEDRINE SULFATE-NACL 50-0.9 MG/10ML-% IV SOSY
PREFILLED_SYRINGE | INTRAVENOUS | Status: DC | PRN
  Administered 2019-05-30 (×2): 10 mg via INTRAVENOUS

## 2019-05-30 MED ORDER — EPHEDRINE 5 MG/ML INJ
INTRAVENOUS | Status: AC
  Filled 2019-05-30: qty 20

## 2019-05-30 MED ORDER — CEFAZOLIN SODIUM-DEXTROSE 2-4 GM/100ML-% IV SOLN
2.0000 g | INTRAVENOUS | Status: AC
  Administered 2019-05-30: 2 g via INTRAVENOUS
  Filled 2019-05-30: qty 100

## 2019-05-30 MED ORDER — BUPIVACAINE-EPINEPHRINE (PF) 0.5% -1:200000 IJ SOLN
INTRAMUSCULAR | Status: DC | PRN
  Administered 2019-05-30: 30 mL via PERINEURAL

## 2019-05-30 MED ORDER — OXYCODONE HCL 5 MG PO TABS: 5.0000 mg | ORAL_TABLET | Freq: Once | ORAL | Status: DC | PRN

## 2019-05-30 MED ORDER — 0.9 % SODIUM CHLORIDE (POUR BTL) OPTIME
TOPICAL | Status: DC | PRN
  Administered 2019-05-30: 1000 mL

## 2019-05-30 MED ORDER — SODIUM CHLORIDE (PF) 0.9 % IJ SOLN
INTRAVENOUS | Status: DC | PRN
  Administered 2019-05-30: 2 mL via INTRAMUSCULAR

## 2019-05-30 MED ORDER — PHENYLEPHRINE 40 MCG/ML (10ML) SYRINGE FOR IV PUSH (FOR BLOOD PRESSURE SUPPORT)
PREFILLED_SYRINGE | INTRAVENOUS | Status: AC
  Filled 2019-05-30: qty 10

## 2019-05-30 MED ORDER — OXYCODONE HCL 5 MG/5ML PO SOLN: 5.0000 mg | Freq: Once | ORAL | Status: DC | PRN

## 2019-05-30 MED ORDER — FENTANYL CITRATE (PF) 100 MCG/2ML IJ SOLN
50.0000 ug | Freq: Once | INTRAMUSCULAR | Status: AC
  Administered 2019-05-30: 11:00:00 50 ug via INTRAVENOUS

## 2019-05-30 MED ORDER — OXYCODONE HCL 5 MG PO TABS: 5.0000 mg | ORAL_TABLET | Freq: Four times a day (QID) | ORAL | 0 refills | Status: DC | PRN

## 2019-05-30 MED ORDER — MIDAZOLAM HCL 2 MG/2ML IJ SOLN
2.0000 mg | Freq: Once | INTRAMUSCULAR | Status: AC
  Administered 2019-05-30: 11:00:00 2 mg via INTRAVENOUS

## 2019-05-30 MED ORDER — LIDOCAINE HCL 1 % IJ SOLN
INTRAMUSCULAR | Status: DC | PRN
  Administered 2019-05-30: 40 mL via INTRAMUSCULAR

## 2019-05-30 MED ORDER — MIDAZOLAM HCL 2 MG/2ML IJ SOLN
INTRAMUSCULAR | Status: AC
  Administered 2019-05-30: 2 mg via INTRAVENOUS
  Filled 2019-05-30: qty 2

## 2019-05-30 MED ORDER — FENTANYL CITRATE (PF) 100 MCG/2ML IJ SOLN
INTRAMUSCULAR | Status: AC
  Administered 2019-05-30: 50 ug via INTRAVENOUS
  Filled 2019-05-30: qty 2

## 2019-05-30 MED ORDER — FENTANYL CITRATE (PF) 250 MCG/5ML IJ SOLN
INTRAMUSCULAR | Status: AC
  Filled 2019-05-30: qty 5

## 2019-05-30 MED ORDER — SUCCINYLCHOLINE CHLORIDE 200 MG/10ML IV SOSY
PREFILLED_SYRINGE | INTRAVENOUS | Status: AC
  Filled 2019-05-30: qty 10

## 2019-05-30 MED ORDER — LIDOCAINE 2% (20 MG/ML) 5 ML SYRINGE
INTRAMUSCULAR | Status: DC | PRN
  Administered 2019-05-30: 60 mg via INTRAVENOUS
  Administered 2019-05-30: 40 mg via INTRAVENOUS

## 2019-05-30 MED ORDER — FENTANYL CITRATE (PF) 100 MCG/2ML IJ SOLN
INTRAMUSCULAR | Status: DC | PRN
  Administered 2019-05-30 (×3): 25 ug via INTRAVENOUS
  Administered 2019-05-30: 50 ug via INTRAVENOUS

## 2019-05-30 MED ORDER — CHLORHEXIDINE GLUCONATE CLOTH 2 % EX PADS: 6.0000 | MEDICATED_PAD | Freq: Once | CUTANEOUS | Status: DC

## 2019-05-30 MED ORDER — ONDANSETRON HCL 4 MG/2ML IJ SOLN
INTRAMUSCULAR | Status: AC
  Filled 2019-05-30: qty 4

## 2019-05-30 MED ORDER — SODIUM CHLORIDE (PF) 0.9 % IJ SOLN
INTRAMUSCULAR | Status: AC
  Filled 2019-05-30: qty 10

## 2019-05-30 MED ORDER — PROPOFOL 10 MG/ML IV BOLUS
INTRAVENOUS | Status: DC | PRN
  Administered 2019-05-30: 40 mg via INTRAVENOUS
  Administered 2019-05-30: 30 mg via INTRAVENOUS
  Administered 2019-05-30: 170 mg via INTRAVENOUS

## 2019-05-30 MED ORDER — TECHNETIUM TC 99M SULFUR COLLOID FILTERED
1.0000 | Freq: Once | INTRAVENOUS | Status: AC | PRN
  Administered 2019-05-30: 1 via INTRADERMAL

## 2019-05-30 MED ORDER — ACETAMINOPHEN 10 MG/ML IV SOLN: 1000.0000 mg | Freq: Once | INTRAVENOUS | Status: DC | PRN

## 2019-05-30 MED ORDER — PROMETHAZINE HCL 25 MG/ML IJ SOLN: 6.2500 mg | INTRAMUSCULAR | Status: DC | PRN

## 2019-05-30 MED ORDER — STERILE WATER FOR IRRIGATION IR SOLN
Status: DC | PRN
  Administered 2019-05-30: 1000 mL

## 2019-05-30 SURGICAL SUPPLY — 60 items
ADH SKN CLS APL DERMABOND .7 (GAUZE/BANDAGES/DRESSINGS) ×1
APL PRP STRL LF DISP 70% ISPRP (MISCELLANEOUS) ×1
BINDER BREAST LRG (GAUZE/BANDAGES/DRESSINGS) IMPLANT
BINDER BREAST XLRG (GAUZE/BANDAGES/DRESSINGS) IMPLANT
BINDER BREAST XXLRG (GAUZE/BANDAGES/DRESSINGS) ×1 IMPLANT
BNDG COHESIVE 4X5 TAN STRL (GAUZE/BANDAGES/DRESSINGS) ×2 IMPLANT
CANISTER SUCT 3000ML PPV (MISCELLANEOUS) ×2 IMPLANT
CHLORAPREP W/TINT 26 (MISCELLANEOUS) ×2 IMPLANT
CLIP VESOCCLUDE LG 6/CT (CLIP) ×3 IMPLANT
CLIP VESOCCLUDE MED 6/CT (CLIP) ×2 IMPLANT
CLIP VESOCCLUDE SM WIDE 6/CT (CLIP) ×2 IMPLANT
CONT SPEC 4OZ CLIKSEAL STRL BL (MISCELLANEOUS) IMPLANT
COVER PROBE W GEL 5X96 (DRAPES) ×2 IMPLANT
COVER SURGICAL LIGHT HANDLE (MISCELLANEOUS) ×2 IMPLANT
COVER WAND RF STERILE (DRAPES) ×1 IMPLANT
DERMABOND ADVANCED (GAUZE/BANDAGES/DRESSINGS) ×1
DERMABOND ADVANCED .7 DNX12 (GAUZE/BANDAGES/DRESSINGS) ×1 IMPLANT
DEVICE DUBIN SPECIMEN MAMMOGRA (MISCELLANEOUS) ×1 IMPLANT
DRAPE CHEST BREAST 15X10 FENES (DRAPES) ×2 IMPLANT
DRSG TEGADERM 4X4.75 (GAUZE/BANDAGES/DRESSINGS) ×1 IMPLANT
DRSG XEROFORM 1X8 (GAUZE/BANDAGES/DRESSINGS) ×1 IMPLANT
ELECT BLADE 4.0 EZ CLEAN MEGAD (MISCELLANEOUS) ×2
ELECT COATED BLADE 2.86 ST (ELECTRODE) ×2 IMPLANT
ELECT NDL BLADE 2-5/6 (NEEDLE) ×1 IMPLANT
ELECT NEEDLE BLADE 2-5/6 (NEEDLE) ×2 IMPLANT
ELECT REM PT RETURN 9FT ADLT (ELECTROSURGICAL) ×2
ELECTRODE BLDE 4.0 EZ CLN MEGD (MISCELLANEOUS) IMPLANT
ELECTRODE REM PT RTRN 9FT ADLT (ELECTROSURGICAL) ×1 IMPLANT
GAUZE SPONGE 2X2 8PLY STRL LF (GAUZE/BANDAGES/DRESSINGS) IMPLANT
GAUZE SPONGE 4X4 12PLY STRL (GAUZE/BANDAGES/DRESSINGS) ×1 IMPLANT
GLOVE BIO SURGEON STRL SZ 6 (GLOVE) ×2 IMPLANT
GLOVE INDICATOR 6.5 STRL GRN (GLOVE) ×2 IMPLANT
GOWN STRL REUS W/ TWL LRG LVL3 (GOWN DISPOSABLE) ×1 IMPLANT
GOWN STRL REUS W/TWL 2XL LVL3 (GOWN DISPOSABLE) ×2 IMPLANT
GOWN STRL REUS W/TWL LRG LVL3 (GOWN DISPOSABLE) ×2
KIT BASIN OR (CUSTOM PROCEDURE TRAY) ×2 IMPLANT
KIT MARKER MARGIN INK (KITS) ×2 IMPLANT
LIGHT WAVEGUIDE WIDE FLAT (MISCELLANEOUS) IMPLANT
NDL 18GX1X1/2 (RX/OR ONLY) (NEEDLE) IMPLANT
NDL FILTER BLUNT 18X1 1/2 (NEEDLE) IMPLANT
NDL HYPO 25GX1X1/2 BEV (NEEDLE) ×1 IMPLANT
NEEDLE 18GX1X1/2 (RX/OR ONLY) (NEEDLE) ×2 IMPLANT
NEEDLE FILTER BLUNT 18X 1/2SAF (NEEDLE)
NEEDLE FILTER BLUNT 18X1 1/2 (NEEDLE) IMPLANT
NEEDLE HYPO 25GX1X1/2 BEV (NEEDLE) ×2 IMPLANT
NS IRRIG 1000ML POUR BTL (IV SOLUTION) ×2 IMPLANT
PACK GENERAL/GYN (CUSTOM PROCEDURE TRAY) ×2 IMPLANT
PACK UNIVERSAL I (CUSTOM PROCEDURE TRAY) ×2 IMPLANT
PAD ABD 8X10 STRL (GAUZE/BANDAGES/DRESSINGS) ×1 IMPLANT
SLEEVE SUCTION 125 (MISCELLANEOUS) ×1 IMPLANT
SPONGE GAUZE 2X2 STER 10/PKG (GAUZE/BANDAGES/DRESSINGS) ×1
SPONGE LAP 18X18 X RAY DECT (DISPOSABLE) ×1 IMPLANT
STOCKINETTE IMPERVIOUS 9X36 MD (GAUZE/BANDAGES/DRESSINGS) ×2 IMPLANT
SUT MNCRL AB 4-0 PS2 18 (SUTURE) ×2 IMPLANT
SUT VIC AB 2-0 SH 27 (SUTURE) ×4
SUT VIC AB 2-0 SH 27XBRD (SUTURE) IMPLANT
SUT VIC AB 3-0 SH 8-18 (SUTURE) ×2 IMPLANT
SYR CONTROL 10ML LL (SYRINGE) ×2 IMPLANT
TOWEL GREEN STERILE (TOWEL DISPOSABLE) ×2 IMPLANT
TOWEL GREEN STERILE FF (TOWEL DISPOSABLE) ×2 IMPLANT

## 2019-05-30 NOTE — Progress Notes (Signed)
Dr. Daiva Huge notified of patient's BP and heart rate. No c/o of lightheadedness or dizziness, patient AXOX4  No orders given. Will continue to monitor

## 2019-05-30 NOTE — Op Note (Signed)
Right Breast Radioactive seed bracketed lumpectomy and sentinel lymph node mapping and biopsy  Indications: This patient presents with history of right breast cancer, grade 3 cT1bN0M0, +/-/+, upper inner quadrant  Pre-operative Diagnosis: right breast cancer  Post-operative Diagnosis: right breast cancer   Surgeon: Stark Klein   Anesthesia: General endotracheal anesthesia  ASA Class: 2  Procedure Details  The patient was seen in the Holding Room. The risks, benefits, complications, treatment options, and expected outcomes were discussed with the patient. The possibilities of bleeding, infection, the need for additional procedures, failure to diagnose a condition, and creating a complication requiring transfusion or operation were discussed with the patient. The patient concurred with the proposed plan, giving informed consent.  The site of surgery properly noted/marked. The patient was taken to Operating Room # 2, identified, and the procedure verified as right Breast seed bracketed Lumpectomy and sentinel lymph node biopsy. A Time Out was held and the above information confirmed.  Methylene blue was injected in the subareolar location  The right arm, breast, and chest were prepped and draped in standard fashion. The lumpectomy was performed by creating a superior circumareolar incision near the previously placed radioactive seeds.  Dissection was carried down to around both points of maximum signal intensity. The cautery was used to perform the dissection.  Hemostasis was achieved with cautery. The edges of the cavity were marked with large clips, with one each medial, lateral, inferior and superior, and two clips posteriorly.   The specimen was inked with the margin marker paint kit.    Specimen radiography confirmed inclusion of the mammographic lesions, the clips, and the seeds.  The background signal in the breast was zero.  The wound was irrigated and closed with 3-0 vicryl in layers and 4-0  monocryl subcuticular suture.    Using a hand-held gamma probe, right axillary sentinel nodes were identified transcutaneously.  An oblique incision was created below the axillary hairline.  Dissection was carried through the clavipectoral fascia.  Three deep level 2 axillary sentinel nodes were removed.  Counts per second were 30, 0, and 39.    The background count was 0 cps.  The wound was irrigated.  Hemostasis was achieved with cautery.  The axillary incision was closed with a 3-0 vicryl deep dermal interrupted sutures and a 4-0 monocryl subcuticular closure.    Sterile dressings were applied. At the end of the operation, all sponge, instrument, and needle counts were correct.  Findings: grossly clear surgical margins and no adenopathy  Estimated Blood Loss:  min         Specimens: right breast lumpectomy with two seeds and three right axillary sentinel lymph nodes.             Complications:  None; patient tolerated the procedure well.         Disposition: PACU - hemodynamically stable.         Condition: stable

## 2019-05-30 NOTE — Anesthesia Procedure Notes (Signed)
Anesthesia Regional Block: Pectoralis block   Pre-Anesthetic Checklist: ,, timeout performed, Correct Patient, Correct Site, Correct Laterality, Correct Procedure, Correct Position, site marked, Risks and benefits discussed, pre-op evaluation,  At surgeon's request and post-op pain management  Laterality: Right  Prep: Maximum Sterile Barrier Precautions used, chloraprep       Needles:  Injection technique: Single-shot  Needle Type: Echogenic Stimulator Needle     Needle Length: 9cm  Needle Gauge: 22     Additional Needles:   Procedures:,,,, ultrasound used (permanent image in chart),,,,  Narrative:  Start time: 05/30/2019 10:40 AM End time: 05/30/2019 10:42 AM Injection made incrementally with aspirations every 5 mL.  Performed by: Personally  Anesthesiologist: Brennan Bailey, MD  Additional Notes: Risks, benefits, and alternative discussed. Patient gave consent for procedure. Patient prepped and draped in sterile fashion. Sedation administered, patient remains easily responsive to voice. Relevant anatomy identified with ultrasound guidance. Local anesthetic given in 5cc increments with no signs or symptoms of intravascular injection. No pain or paraesthesias with injection. Patient monitored throughout procedure with signs of LAST or immediate complications. Tolerated well. Ultrasound image placed in chart.  Tawny Asal, MD

## 2019-05-30 NOTE — Assessment & Plan Note (Addendum)
04/19/2019:Screening mammogram detected right breast asymmetry. Diagnostic mammogram and US showed 2 indeterminate right breast masses, 89m at 1 o'clock, 745mat 12 o'clock, with no axillary adenopathy. 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  HER-2 positive (3+), ER+ (80%), PR -, Ki67 40%.  Stage Ia   Pathology counseling: I discussed the final pathology report of the patient provided  a copy of this report. I discussed the margins as well as lymph node surgeries. We also discussed the final staging along with previously performed ER/PR and HER-2/neu testing.  Treatment plan: 1. Adjuvant chemo with Taxol Herceptin if the final tumor size is more than 5 mm 2. Adjuvant radiation therapy followed by 3. Adjuvant antiestrogen therapy

## 2019-05-30 NOTE — Anesthesia Procedure Notes (Signed)
Procedure Name: LMA Insertion Date/Time: 05/30/2019 11:44 AM Performed by: Genelle Bal, CRNA Pre-anesthesia Checklist: Patient identified, Emergency Drugs available, Suction available and Patient being monitored Patient Re-evaluated:Patient Re-evaluated prior to induction Oxygen Delivery Method: Circle system utilized Preoxygenation: Pre-oxygenation with 100% oxygen Induction Type: IV induction Ventilation: Mask ventilation without difficulty LMA: LMA inserted LMA Size: 4.0 Number of attempts: 1 Airway Equipment and Method: Bite block Placement Confirmation: positive ETCO2 Tube secured with: Tape Dental Injury: Teeth and Oropharynx as per pre-operative assessment

## 2019-05-30 NOTE — Transfer of Care (Signed)
Immediate Anesthesia Transfer of Care Note  Patient: Kathy Reese  Procedure(s) Performed: RIGHT BREAST LUMPECTOMY WITH BRACKETED RADIOACTIVE SEEDS  AND SENTINEL LYMPH NODE BIOPSY (Right Breast)  Patient Location: PACU  Anesthesia Type:GA combined with regional for post-op pain  Level of Consciousness: awake, alert  and oriented  Airway & Oxygen Therapy: Patient Spontanous Breathing and Patient connected to face mask oxygen  Post-op Assessment: Report given to RN and Post -op Vital signs reviewed and stable  Post vital signs: Reviewed and stable  Last Vitals:  Vitals Value Taken Time  BP    Temp    Pulse 72 05/30/19 1340  Resp 15 05/30/19 1340  SpO2 100 % 05/30/19 1340  Vitals shown include unvalidated device data.  Last Pain:  Vitals:   05/30/19 1125  TempSrc:   PainSc: 0-No pain         Complications: No apparent anesthesia complications

## 2019-05-30 NOTE — Discharge Instructions (Addendum)
[facility] °Office Phone Number [phone] ° °BREAST BIOPSY/ PARTIAL MASTECTOMY: POST OP INSTRUCTIONS ° °Always review your discharge instruction sheet given to you by the facility where your surgery was performed. ° °IF YOU HAVE DISABILITY OR FAMILY LEAVE FORMS, YOU MUST BRING THEM TO THE OFFICE FOR PROCESSING.  DO NOT GIVE THEM TO YOUR DOCTOR. ° °1. A prescription for pain medication may be given to you upon discharge.  Take your pain medication as prescribed, if needed.  If narcotic pain medicine is not needed, then you may take acetaminophen (Tylenol) or ibuprofen (Advil) as needed. °2. Take your usually prescribed medications unless otherwise directed °3. If you need a refill on your pain medication, please contact your pharmacy.  They will contact our office to request authorization.  Prescriptions will not be filled after 5pm or on week-ends. °4. You should eat very light the first 24 hours after surgery, such as soup, crackers, pudding, etc.  Resume your normal diet the day after surgery. °5. Most patients will experience some swelling and bruising in the breast.  Ice packs and a good support bra will help.  Swelling and bruising can take several days to resolve.  °6. It is common to experience some constipation if taking pain medication after surgery.  Increasing fluid intake and taking a stool softener will usually help or prevent this problem from occurring.  A mild laxative (Milk of Magnesia or Miralax) should be taken according to package directions if there are no bowel movements after 48 hours. °7. Unless discharge instructions indicate otherwise, you may remove your bandages 48 hours after surgery, and you may shower at that time.  You may have steri-strips (small skin tapes) in place directly over the incision.  These strips should be left on the skin for 7-10 days.   Any sutures or staples will be removed at the office during your follow-up visit. °8. ACTIVITIES:  You may resume  regular daily activities (gradually increasing) beginning the next day.  Wearing a good support bra or sports bra (or the breast binder) minimizes pain and swelling.  You may have sexual intercourse when it is comfortable. °a. You may drive when you no longer are taking prescription pain medication, you can comfortably wear a seatbelt, and you can safely maneuver your car and apply brakes. °b. RETURN TO WORK:  __________1 week_______________ °9. You should see your doctor in the office for a follow-up appointment approximately two weeks after your surgery.  Your doctor’s nurse will typically make your follow-up appointment when she calls you with your pathology report.  Expect your pathology report 2-3 business days after your surgery.  You may call to check if you do not hear from us after three days. ° ° °WHEN TO CALL YOUR DOCTOR: °1. Fever over 101.0 °2. Nausea and/or vomiting. °3. Extreme swelling or bruising. °4. Continued bleeding from incision. °5. Increased pain, redness, or drainage from the incision. ° °The clinic staff is available to answer your questions during regular business hours.  Please don’t hesitate to call and ask to speak to one of the nurses for clinical concerns.  If you have a medical emergency, go to the nearest emergency room or call 911.  A surgeon from [facility] is always on call at the hospital. ° °For further questions, please visit [email or web address]  ° °

## 2019-05-30 NOTE — Anesthesia Preprocedure Evaluation (Addendum)
Anesthesia Evaluation  Patient identified by MRN, date of birth, ID band Patient awake    Reviewed: Allergy & Precautions, NPO status , Patient's Chart, lab work & pertinent test results, reviewed documented beta blocker date and time   History of Anesthesia Complications (+) PONV and history of anesthetic complications  Airway Mallampati: II  TM Distance: >3 FB Neck ROM: Full    Dental no notable dental hx.    Pulmonary sleep apnea and Continuous Positive Airway Pressure Ventilation ,    Pulmonary exam normal        Cardiovascular hypertension, Pt. on home beta blockers and Pt. on medications Normal cardiovascular exam     Neuro/Psych Depression negative neurological ROS     GI/Hepatic negative GI ROS, Neg liver ROS,   Endo/Other  diabetes, Type 2, Insulin Dependent  Renal/GU negative Renal ROS     Musculoskeletal  (+) Arthritis ,   Abdominal   Peds  Hematology negative hematology ROS (+)   Anesthesia Other Findings Day of surgery medications reviewed with the patient.  Reproductive/Obstetrics                            Anesthesia Physical Anesthesia Plan  ASA: II  Anesthesia Plan: General   Post-op Pain Management: GA combined w/ Regional for post-op pain   Induction: Intravenous  PONV Risk Score and Plan: 4 or greater and Treatment may vary due to age or medical condition, Ondansetron, Dexamethasone, Midazolam and Propofol infusion  Airway Management Planned: LMA  Additional Equipment: None  Intra-op Plan:   Post-operative Plan: Extubation in OR  Informed Consent: I have reviewed the patients History and Physical, chart, labs and discussed the procedure including the risks, benefits and alternatives for the proposed anesthesia with the patient or authorized representative who has indicated his/her understanding and acceptance.     Dental advisory given  Plan Discussed  with: CRNA  Anesthesia Plan Comments:        Anesthesia Quick Evaluation

## 2019-05-30 NOTE — Interval H&P Note (Signed)
History and Physical Interval Note:  05/30/2019 11:02 AM  [place]  has presented today for surgery, with the diagnosis of RIGHT BREAST CANCER.  The various methods of treatment have been discussed with the patient and family. After consideration of risks, benefits and other options for treatment, the patient has consented to  Procedure(s): RIGHT BREAST LUMPECTOMY WITH BRACKETED RADIOACTIVE SEEDS  AND SENTINEL LYMPH NODE BIOPSY (Right) as a surgical intervention.  We decided not to do port placement until we see the final size of the her 2 positive tumor.  The patient's history has been reviewed, patient examined, no change in status, stable for surgery.  I have reviewed the patient's chart and labs.  Questions were answered to the patient's satisfaction.     Stark Klein

## 2019-05-31 ENCOUNTER — Encounter (HOSPITAL_COMMUNITY): Payer: Self-pay | Admitting: General Surgery

## 2019-06-01 NOTE — Anesthesia Postprocedure Evaluation (Signed)
Anesthesia Post Note  Patient: Kathy Reese  Procedure(s) Performed: RIGHT BREAST LUMPECTOMY WITH BRACKETED RADIOACTIVE SEEDS  AND SENTINEL LYMPH NODE BIOPSY (Right Breast)     Patient location during evaluation: PACU Anesthesia Type: General Level of consciousness: awake and alert and oriented Pain management: pain level controlled Vital Signs Assessment: post-procedure vital signs reviewed and stable Respiratory status: spontaneous breathing, nonlabored ventilation and respiratory function stable Cardiovascular status: blood pressure returned to baseline Postop Assessment: no apparent nausea or vomiting Anesthetic complications: no    Last Vitals:  Vitals:   05/30/19 1415 05/30/19 1416  BP: (!) 120/54 (!) 120/54  Pulse: (!) 56 (!) 58  Resp: 19 16  Temp:  36.5 C  SpO2: 100% 99%    Last Pain:  Vitals:   05/30/19 1416  TempSrc:   PainSc: 0-No pain                 Brennan Bailey

## 2019-06-05 ENCOUNTER — Encounter: Payer: Self-pay | Admitting: *Deleted

## 2019-06-05 NOTE — Progress Notes (Signed)
HEMATOLOGY-ONCOLOGY DOXIMITY VISIT PROGRESS NOTE  I connected with Kathy Reese on 06/06/2019 at  9:30 AM EDT by Doximity video conference and verified that I am speaking with the correct person using two identifiers.  I discussed the limitations, risks, security and privacy concerns of performing an evaluation and management service by Doximity and the availability of in person appointments.  I also discussed with the patient that there may be a patient responsible charge related to this service. The patient expressed understanding and agreed to proceed.  Patient's Location: Home Physician Location: Clinic  CHIEF COMPLIANT: Follow-up s/p lumpectomy to review pathology  INTERVAL HISTORY: Kathy Reese is a 68 y.o. female with above-mentioned history of right breast cancer. 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 She presents over Doximity today to review the pathology report and discuss further treatment.   Oncology History  Malignant neoplasm of upper-inner quadrant of right breast in female, estrogen receptor positive ([place])  04/12/2019 Cancer Staging   Staging form: Breast, AJCC 8th Edition - Clinical stage from 04/12/2019: Stage IA (cT1b, cN0, cM0, G3, ER+, PR-, HER2+) - Signed by Gardenia Phlegm, NP on 04/19/2019   04/19/2019 Initial Diagnosis   Screening mammogram detected right breast asymmetry. Diagnostic mammogram and US showed 2 indeterminate right breast masses, 4m at 1 o'clock, 731mat 12 o'clock, with no axillary adenopathy. 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    05/30/2019 Surgery   Right lumpectomy ([facility] IDC with DCIS, grade 3, 0.6cm, clear margins, 3 axillary lymph nodes negative.      REVIEW OF SYSTEMS:   Constitutional: Denies fevers, chills or  abnormal weight loss Eyes: Denies blurriness of vision Ears, nose, mouth, throat, and face: Denies mucositis or sore throat Respiratory: Denies cough, dyspnea or wheezes Cardiovascular: Denies palpitation, chest discomfort Gastrointestinal:  Denies nausea, heartburn or change in bowel habits Skin: Denies abnormal skin rashes Lymphatics: Denies new lymphadenopathy or easy bruising Neurological:Denies numbness, tingling or new weaknesses Behavioral/Psych: Mood is stable, no new changes  Extremities: No lower extremity edema Breast: Recent right lumpectomy All other systems were reviewed with the patient and are negative.  Observations/Objective:  There were no vitals filed for this visit. There is no height or weight on file to calculate BMI.  I have reviewed the data as listed CMP Latest Ref Rng & Units 05/25/2019 08/24/2018 05/15/2013  Glucose 70 - 99 mg/dL 139(H) 142(H) 133(H)  BUN 8 - 23 mg/dL 20 19 9   Creatinine 0.44 - 1.00 mg/dL 0.86 0.81 0.53  Sodium 135 - 145 mmol/L 139 136 141  Potassium 3.5 - 5.1 mmol/L 4.0 3.5 3.8  Chloride 98 - 111 mmol/L 104 102 104  CO2 22 - 32 mmol/L 25 24 29   Calcium 8.9 - 10.3 mg/dL 9.5 10.0 8.9  Total Protein 6.5 - 8.1 g/dL - 7.9 -  Total Bilirubin 0.3 - 1.2 mg/dL - 0.8 -  Alkaline Phos 38 - 126 U/L - 96 -  AST 15 - 41 U/L - 26 -  ALT 0 - 44 U/L - 28 -    Lab Results  Component Value Date   WBC 6.5 05/25/2019   HGB 12.6 05/25/2019   HCT 39.5 05/25/2019   MCV 92.1 05/25/2019   PLT 185 05/25/2019   NEUTROABS 6.3 08/24/2018      Assessment  Plan:  Malignant neoplasm of upper-inner quadrant of right breast in female, estrogen receptor positive ([place]) 04/19/2019:Screening mammogram detected right breast asymmetry. Diagnostic mammogram and US showed 2 indeterminate right breast masses, 81m at 1 o'clock, 752mat 12 o'clock, with no axillary adenopathy. 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  HER-2 positive (3+), ER+ (80%), PR -, Ki67 40%.  Stage Ia   Pathology counseling: I discussed the final pathology report of the patient provided  a copy of this report. I discussed the margins as well as lymph node surgeries. We also discussed the final staging along with previously performed ER/PR and HER-2/neu testing.  Treatment plan: 1. Adjuvant chemo with Taxol Herceptin plan to start 06/27/2019 2. Adjuvant radiation therapy followed by 3. Adjuvant antiestrogen therapy  Chemo counseling: I discussed risks and benefits of chemotherapy including the risk of hair loss, nausea, neuropathy, cytopenias, Herceptin related decrease in cardiac ejection fraction. Also counseled her about participating into clinical trials.  Upbeat clinical trial and neuropathy trial swog [id number]   I discussed the assessment and treatment plan with the patient. The patient was provided an opportunity to ask questions and all were answered. The patient agreed with the plan and demonstrated an understanding of the instructions. The patient was advised to call back or seek an in-person evaluation if the symptoms worsen or if the condition fails to improve as anticipated.   I provided 15 minutes of face-to-face Doximity time during this encounter.    ViRulon EisenmengerMD 06/06/2019   I, Molly Dorshimer, am acting as scribe for ViNicholas LoseMD.  I have reviewed the above documentation for accuracy and completeness, and I agree with the above.

## 2019-06-06 ENCOUNTER — Encounter: Payer: Self-pay | Admitting: Medical Oncology

## 2019-06-06 ENCOUNTER — Other Ambulatory Visit: Payer: Self-pay

## 2019-06-06 ENCOUNTER — Inpatient Hospital Stay: Payer: Medicare Other | Attending: Hematology and Oncology | Admitting: Hematology and Oncology

## 2019-06-06 VITALS — BP 172/64 | HR 57 | Temp 98.3°F | Resp 17 | Ht 67.0 in | Wt 215.5 lb

## 2019-06-06 DIAGNOSIS — C50211 Malignant neoplasm of upper-inner quadrant of right female breast: Secondary | ICD-10-CM

## 2019-06-06 DIAGNOSIS — Z5112 Encounter for antineoplastic immunotherapy: Secondary | ICD-10-CM | POA: Insufficient documentation

## 2019-06-06 DIAGNOSIS — Z170 Estrogen receptor positive status [ER+]: Secondary | ICD-10-CM

## 2019-06-06 DIAGNOSIS — Z23 Encounter for immunization: Secondary | ICD-10-CM | POA: Insufficient documentation

## 2019-06-06 DIAGNOSIS — Z5111 Encounter for antineoplastic chemotherapy: Secondary | ICD-10-CM | POA: Insufficient documentation

## 2019-06-06 MED ORDER — LIDOCAINE-PRILOCAINE 2.5-2.5 % EX CREA: TOPICAL_CREAM | CUTANEOUS | 3 refills | Status: DC

## 2019-06-06 MED ORDER — LORAZEPAM 0.5 MG PO TABS: 0.5000 mg | ORAL_TABLET | Freq: Every evening | ORAL | 0 refills | Status: DC | PRN

## 2019-06-06 MED ORDER — PROCHLORPERAZINE MALEATE 10 MG PO TABS: 10.0000 mg | ORAL_TABLET | Freq: Four times a day (QID) | ORAL | 1 refills | Status: DC | PRN

## 2019-06-06 MED ORDER — ONDANSETRON HCL 8 MG PO TABS: 8.0000 mg | ORAL_TABLET | Freq: Two times a day (BID) | ORAL | 1 refills | Status: DC | PRN

## 2019-06-06 NOTE — Addendum Note (Signed)
Addended by: Nicholas Lose on: 06/06/2019 05:28 PM   Modules accepted: Orders

## 2019-06-06 NOTE — Research (Signed)
UPBEAT: [facility] [id number] Referral Dr. Lindi Adie referred patient to study. I met with patient this morning, who is here alone, in exam room after her appointment with Dr. Lindi Adie. Patient confirms that Dr. Lindi Adie gave her a brief overview of study and patient expressed interest in knowing more. I provided patient with the study consent and a brief overview of study as well. Patient was informed of the purpose of the study and what the study assessments consist of and the visits time frame. Patient was also informed that if she was interested in participating in study, eligibility and baseline assessments would need to be completed prior to the start of her chemotherapy. I confirmed with patient that she is not claustrophobic and that she does not have any metal implants. Patient also confirms to be able to hold her breath for at least 10 seconds, is able to walk at least 2 blocks without chest pain, shortness of breath or fainting and is able to exercise on a treadmill or stationary cycle. All patient's questions answered to her satisfaction. Patient states that she would like to take some time to review the information provided to her with her family and will contact me if she chooses to participate. Patient was thanked for her time and interest in study and encouraged to call Dr. Lindi Adie or myself with any questions she may have.  Patient was provided the study consent form, the study information pamphlet, a clinical trials information pamphlet and my contact information.  Per Dr. Lindi Adie, chemotherapy plan start date is 06/27/19. Approximately 9 minutest was spent with patient. Maxwell Marion, RN, BSN, [facility] Clinical Research 06/06/2019 1:42 PM

## 2019-06-06 NOTE — Research (Signed)
[id number]: Referral Dr. Lindi Adie referred patient to study. I met with patient this morning, who was here alone, after her scheduled appointment with Dr. Lindi Adie. Patient confirms that MD gave her a brief overview of this study and she has expressed interest in more information. I gave patient a brief overview of study, the purpose of the study and what the study assessments entail, as well as the length of study. Patient was informed that participation is voluntary. Patient was provided with the study consent form to take home and review, as well as a clinical trials information pamphlet and my contact information. Patient informed me she will review the study information with her daughter and will contact me if she is interested. All patient's questions answered to her satisfaction. Patient was thanked for her time and interest in study and encouraged to call Dr. Lindi Adie or myself with questions.  Per MD, patient planned for start of chemotherapy 06/27/19.  Maxwell Marion, RN, BSN, [facility] Clinical Research 06/06/2019 1:53 PM

## 2019-06-06 NOTE — Progress Notes (Signed)
START ON PATHWAY REGIMEN - Breast   Paclitaxel Weekly + Trastuzumab Weekly:   Administer weekly:     Paclitaxel      Trastuzumab-xxxx      Trastuzumab-xxxx   **Always confirm dose/schedule in your pharmacy ordering system**  Trastuzumab (Maintenance - NO Loading Dose):   A cycle is every 21 days:     Trastuzumab-xxxx   **Always confirm dose/schedule in your pharmacy ordering system**  Patient Characteristics: Postoperative without Neoadjuvant Therapy (Pathologic Staging), Invasive Disease, Adjuvant Therapy, HER2 Positive, ER Positive, Node Negative, pT1b, pN0/N65m, Chemotherapy Indicated Therapeutic Status: Postoperative without Neoadjuvant Therapy (Pathologic Staging) AJCC Grade: G3 AJCC N Category: pN0 AJCC M Category: cM0 ER Status: Positive (+) AJCC 8 Stage Grouping: IA HER2 Status: Positive (+) Oncotype Dx Recurrence Score: Not Appropriate AJCC T Category: pT1b PR Status: Negative (-) Intervention Indicated: Chemotherapy Intent of Therapy: Curative Intent, Discussed with Patient

## 2019-06-07 ENCOUNTER — Other Ambulatory Visit: Payer: Self-pay | Admitting: General Surgery

## 2019-06-07 ENCOUNTER — Telehealth: Payer: Self-pay | Admitting: Hematology and Oncology

## 2019-06-07 NOTE — Telephone Encounter (Signed)
I talk with patient regarding schedule  

## 2019-06-13 ENCOUNTER — Other Ambulatory Visit (HOSPITAL_COMMUNITY)
Admission: RE | Admit: 2019-06-13 | Discharge: 2019-06-13 | Disposition: A | Discharge status: Home / Self Care | Payer: Medicare Other | Source: Ambulatory Visit | Attending: General Surgery | Admitting: General Surgery

## 2019-06-13 ENCOUNTER — Other Ambulatory Visit: Payer: Self-pay

## 2019-06-13 ENCOUNTER — Ambulatory Visit (HOSPITAL_COMMUNITY)
Admission: RE | Admit: 2019-06-13 | Discharge: 2019-06-13 | Disposition: A | Discharge status: Home / Self Care | Payer: Medicare Other | Source: Ambulatory Visit | Attending: Hematology and Oncology | Admitting: Hematology and Oncology

## 2019-06-13 ENCOUNTER — Encounter (INDEPENDENT_AMBULATORY_CARE_PROVIDER_SITE_OTHER): Payer: Self-pay

## 2019-06-13 DIAGNOSIS — I10 Essential (primary) hypertension: Secondary | ICD-10-CM | POA: Diagnosis not present

## 2019-06-13 DIAGNOSIS — Z170 Estrogen receptor positive status [ER+]: Secondary | ICD-10-CM | POA: Diagnosis not present

## 2019-06-13 DIAGNOSIS — I700 Atherosclerosis of aorta: Secondary | ICD-10-CM | POA: Diagnosis not present

## 2019-06-13 DIAGNOSIS — E785 Hyperlipidemia, unspecified: Secondary | ICD-10-CM | POA: Insufficient documentation

## 2019-06-13 DIAGNOSIS — C50211 Malignant neoplasm of upper-inner quadrant of right female breast: Secondary | ICD-10-CM | POA: Diagnosis not present

## 2019-06-13 DIAGNOSIS — E119 Type 2 diabetes mellitus without complications: Secondary | ICD-10-CM | POA: Diagnosis not present

## 2019-06-13 DIAGNOSIS — I313 Pericardial effusion (noninflammatory): Secondary | ICD-10-CM | POA: Diagnosis not present

## 2019-06-13 DIAGNOSIS — Z20828 Contact with and (suspected) exposure to other viral communicable diseases: Secondary | ICD-10-CM | POA: Insufficient documentation

## 2019-06-13 DIAGNOSIS — G4730 Sleep apnea, unspecified: Secondary | ICD-10-CM | POA: Insufficient documentation

## 2019-06-13 NOTE — Progress Notes (Signed)
  Echocardiogram 2D Echocardiogram has been performed.  Kathy Reese 06/13/2019, 10:55 AM

## 2019-06-14 LAB — NOVEL CORONAVIRUS, NAA (HOSP ORDER, SEND-OUT TO REF LAB; TAT 18-24 HRS): SARS-CoV-2, NAA: NOT DETECTED

## 2019-06-15 ENCOUNTER — Telehealth: Payer: Self-pay | Admitting: Medical Oncology

## 2019-06-15 ENCOUNTER — Other Ambulatory Visit: Payer: Self-pay

## 2019-06-15 ENCOUNTER — Encounter (HOSPITAL_COMMUNITY): Payer: Self-pay | Admitting: *Deleted

## 2019-06-15 NOTE — Telephone Encounter (Signed)
[id number]: Referral Follow up call to patient regarding study. Inquired with patient if she had any questions related to [phone] study information I provided to her on 09/01. I gave her a brief overview of what the study is about and what the assessments would entail. Patient was informed that should she wish to participate, baseline assessments would need to be completed prior to her starting chemotherapy, which is scheduled for 09/22. Patient states she does not have any questions other than confirming there is no study drug involved, in which I confirmed that there is not. Patient states she would like for me to talk with her daughter, who is a physician, tomorrow. Patient states her daughter will be home tomorrow and will be able to call me. Patient was provided with my contact information and thanked for her time and interest.  Maxwell Marion, RN, BSN, [place] 06/15/2019 2:39 PM

## 2019-06-15 NOTE — Telephone Encounter (Signed)
UPBEAT: Referral Follow up call to patient regarding study. Inquired with patient if she had any questions related to the UPBEAT study information I provided to her on 09/01. I gave her a brief overview of what the study is about and what the assessments would entail. Patient was informed that should she wish to participate, baseline assessments would need to be completed prior to her starting chemotherapy, which is scheduled for 09/22. Patient states she does not have any questions other than confirming there is no study drug involved, in which I confirmed that there is not. Patient states she would like for me to talk with her daughter, who is a physician, tomorrow. Patient states her daughter will be home tomorrow and will be able to call me. Patient was provided with my contact information and thanked for her time and interest.  Maxwell Marion, RN, BSN, [place] 06/15/2019 2:35 PM

## 2019-06-15 NOTE — Progress Notes (Signed)
Spoke with pt for pre-op call. Pt denies cardiac history, chest pain or sob. Pt is a type 2 diabetic. States last A1C was 6.? Within the last 2-3 months. States her endocrinologist is Dr. Chalmers Cater. Pt states her fasting blood sugar is usually between 99-122. Instructed pt to take 1/2 of her regular dose of Levemir tonight and in the AM (she will take 6 units tonight and 7 units in the AM). Instructed her to check her blood sugar when she gets up in the AM and every 2 hours until she leaves for the hospital. If blood sugar is 70 or below, treat with 1/2 cup of clear juice (apple or cranberry) and recheck blood sugar 15 minutes after drinking juice. If blood sugar continues to be 70 or below, call the Short Stay department and ask to speak to a nurse.  Pt had Covid test done on 06/13/19 and it was negative. Pt states she has been in quarantine since the test was done.   Coronavirus Screening  Have you experienced the following symptoms:  Cough yes/no: No Fever (>100.64F)  yes/no: No Runny nose yes/no: No Sore throat yes/no: No Difficulty breathing/shortness of breath  yes/no: No  Have you or a family member traveled in the last 14 days and where? yes/no: No

## 2019-06-16 ENCOUNTER — Ambulatory Visit (HOSPITAL_COMMUNITY): Payer: Medicare Other

## 2019-06-16 ENCOUNTER — Encounter (HOSPITAL_COMMUNITY)
Admission: RE | Disposition: A | Discharge status: Home / Self Care | Payer: Self-pay | Source: Home / Self Care | Attending: General Surgery

## 2019-06-16 ENCOUNTER — Other Ambulatory Visit: Payer: Self-pay

## 2019-06-16 ENCOUNTER — Encounter (HOSPITAL_COMMUNITY): Payer: Self-pay

## 2019-06-16 ENCOUNTER — Ambulatory Visit (HOSPITAL_COMMUNITY): Payer: Medicare Other | Admitting: Anesthesiology

## 2019-06-16 ENCOUNTER — Ambulatory Visit (HOSPITAL_COMMUNITY)
Admission: RE | Admit: 2019-06-16 | Discharge: 2019-06-16 | Disposition: A | Discharge status: Home / Self Care | Payer: Medicare Other | Attending: General Surgery | Admitting: General Surgery

## 2019-06-16 DIAGNOSIS — Z7982 Long term (current) use of aspirin: Secondary | ICD-10-CM | POA: Diagnosis not present

## 2019-06-16 DIAGNOSIS — Z794 Long term (current) use of insulin: Secondary | ICD-10-CM | POA: Diagnosis not present

## 2019-06-16 DIAGNOSIS — Z170 Estrogen receptor positive status [ER+]: Secondary | ICD-10-CM | POA: Insufficient documentation

## 2019-06-16 DIAGNOSIS — Z95828 Presence of other vascular implants and grafts: Secondary | ICD-10-CM

## 2019-06-16 DIAGNOSIS — Z79899 Other long term (current) drug therapy: Secondary | ICD-10-CM | POA: Insufficient documentation

## 2019-06-16 DIAGNOSIS — E119 Type 2 diabetes mellitus without complications: Secondary | ICD-10-CM | POA: Diagnosis not present

## 2019-06-16 DIAGNOSIS — C50411 Malignant neoplasm of upper-outer quadrant of right female breast: Secondary | ICD-10-CM | POA: Diagnosis not present

## 2019-06-16 HISTORY — PX: PORTACATH PLACEMENT: SHX2246

## 2019-06-16 LAB — BASIC METABOLIC PANEL
Anion gap: 10 (ref 5–15)
BUN: 21 mg/dL (ref 8–23)
CO2: 24 mmol/L (ref 22–32)
Calcium: 9.4 mg/dL (ref 8.9–10.3)
Chloride: 104 mmol/L (ref 98–111)
Creatinine, Ser: 0.9 mg/dL (ref 0.44–1.00)
GFR calc Af Amer: >60 mL/min (ref >60)
GFR calc non Af Amer: >60 mL/min (ref >60)
Glucose, Bld: 114 mg/dL — ABNORMAL HIGH (ref 70–99)
Potassium: 3.7 mmol/L (ref 3.5–5.1)
Sodium: 138 mmol/L (ref 135–145)

## 2019-06-16 LAB — GLUCOSE, CAPILLARY
Glucose-Capillary: 105 mg/dL — ABNORMAL HIGH (ref 70–99)
Glucose-Capillary: 120 mg/dL — ABNORMAL HIGH (ref 70–99)

## 2019-06-16 SURGERY — INSERTION, TUNNELED CENTRAL VENOUS DEVICE, WITH PORT
Anesthesia: General | Site: Chest | Laterality: Left

## 2019-06-16 MED ORDER — OXYCODONE HCL 5 MG/5ML PO SOLN: 5.0000 mg | Freq: Once | ORAL | Status: DC | PRN

## 2019-06-16 MED ORDER — PROMETHAZINE HCL 25 MG/ML IJ SOLN: 6.2500 mg | INTRAMUSCULAR | Status: DC | PRN

## 2019-06-16 MED ORDER — LIDOCAINE 2% (20 MG/ML) 5 ML SYRINGE
INTRAMUSCULAR | Status: AC
  Filled 2019-06-16: qty 5

## 2019-06-16 MED ORDER — FENTANYL CITRATE (PF) 100 MCG/2ML IJ SOLN: 25.0000 ug | INTRAMUSCULAR | Status: DC | PRN

## 2019-06-16 MED ORDER — PROPOFOL 500 MG/50ML IV EMUL
INTRAVENOUS | Status: DC | PRN
  Administered 2019-06-16: 25 ug/kg/min via INTRAVENOUS

## 2019-06-16 MED ORDER — HEPARIN SOD (PORK) LOCK FLUSH 100 UNIT/ML IV SOLN
INTRAVENOUS | Status: AC
  Filled 2019-06-16: qty 5

## 2019-06-16 MED ORDER — DEXAMETHASONE SODIUM PHOSPHATE 4 MG/ML IJ SOLN
INTRAMUSCULAR | Status: DC | PRN
  Administered 2019-06-16: 6 mg via INTRAVENOUS
  Administered 2019-06-16: 4 mg via INTRAVENOUS

## 2019-06-16 MED ORDER — FENTANYL CITRATE (PF) 100 MCG/2ML IJ SOLN
INTRAMUSCULAR | Status: DC | PRN
  Administered 2019-06-16 (×2): 50 ug via INTRAVENOUS

## 2019-06-16 MED ORDER — LIDOCAINE HCL 1 % IJ SOLN
INTRAMUSCULAR | Status: AC
  Filled 2019-06-16: qty 20

## 2019-06-16 MED ORDER — OXYCODONE HCL 5 MG PO TABS: 5.0000 mg | ORAL_TABLET | Freq: Once | ORAL | Status: DC | PRN

## 2019-06-16 MED ORDER — FENTANYL CITRATE (PF) 250 MCG/5ML IJ SOLN
INTRAMUSCULAR | Status: AC
  Filled 2019-06-16: qty 5

## 2019-06-16 MED ORDER — LIDOCAINE HCL 1 % IJ SOLN
INTRAMUSCULAR | Status: DC | PRN
  Administered 2019-06-16: 20 mL

## 2019-06-16 MED ORDER — PROPOFOL 10 MG/ML IV BOLUS
INTRAVENOUS | Status: AC
  Filled 2019-06-16: qty 20

## 2019-06-16 MED ORDER — GABAPENTIN 100 MG PO CAPS
100.0000 mg | ORAL_CAPSULE | ORAL | Status: AC
  Administered 2019-06-16: 100 mg via ORAL
  Filled 2019-06-16: qty 1

## 2019-06-16 MED ORDER — BUPIVACAINE-EPINEPHRINE (PF) 0.25% -1:200000 IJ SOLN
INTRAMUSCULAR | Status: AC
  Filled 2019-06-16: qty 30

## 2019-06-16 MED ORDER — SUCCINYLCHOLINE CHLORIDE 200 MG/10ML IV SOSY
PREFILLED_SYRINGE | INTRAVENOUS | Status: AC
  Filled 2019-06-16: qty 10

## 2019-06-16 MED ORDER — LIDOCAINE HCL (CARDIAC) PF 100 MG/5ML IV SOSY
PREFILLED_SYRINGE | INTRAVENOUS | Status: DC | PRN
  Administered 2019-06-16: 60 mg via INTRAVENOUS
  Administered 2019-06-16 (×2): 40 mg via INTRAVENOUS

## 2019-06-16 MED ORDER — GLYCOPYRROLATE PF 0.2 MG/ML IJ SOSY
PREFILLED_SYRINGE | INTRAMUSCULAR | Status: AC
  Filled 2019-06-16: qty 1

## 2019-06-16 MED ORDER — SODIUM CHLORIDE 0.9 % IV SOLN
INTRAVENOUS | Status: AC
  Filled 2019-06-16: qty 1.2

## 2019-06-16 MED ORDER — CHLORHEXIDINE GLUCONATE CLOTH 2 % EX PADS: 6.0000 | MEDICATED_PAD | Freq: Once | CUTANEOUS | Status: DC

## 2019-06-16 MED ORDER — STERILE WATER FOR IRRIGATION IR SOLN
Status: DC | PRN
  Administered 2019-06-16: 1000 mL

## 2019-06-16 MED ORDER — SUCCINYLCHOLINE CHLORIDE 200 MG/10ML IV SOSY
PREFILLED_SYRINGE | INTRAVENOUS | Status: DC | PRN
  Administered 2019-06-16: 100 mg via INTRAVENOUS

## 2019-06-16 MED ORDER — PROPOFOL 10 MG/ML IV BOLUS
INTRAVENOUS | Status: DC | PRN
  Administered 2019-06-16 (×2): 50 mg via INTRAVENOUS
  Administered 2019-06-16: 200 mg via INTRAVENOUS
  Administered 2019-06-16: 50 mg via INTRAVENOUS

## 2019-06-16 MED ORDER — HEPARIN SOD (PORK) LOCK FLUSH 100 UNIT/ML IV SOLN
INTRAVENOUS | Status: DC | PRN
  Administered 2019-06-16: 500 [IU]

## 2019-06-16 MED ORDER — LACTATED RINGERS IV SOLN
INTRAVENOUS | Status: DC | PRN
  Administered 2019-06-16 (×2): via INTRAVENOUS

## 2019-06-16 MED ORDER — SODIUM CHLORIDE 0.9 % IV SOLN: INTRAVENOUS | Status: DC

## 2019-06-16 MED ORDER — ONDANSETRON HCL 4 MG/2ML IJ SOLN
INTRAMUSCULAR | Status: AC
  Filled 2019-06-16: qty 2

## 2019-06-16 MED ORDER — GLYCOPYRROLATE PF 0.2 MG/ML IJ SOSY
PREFILLED_SYRINGE | INTRAMUSCULAR | Status: DC | PRN
  Administered 2019-06-16: .2 mg via INTRAVENOUS

## 2019-06-16 MED ORDER — SODIUM CHLORIDE 0.9 % IV SOLN
INTRAVENOUS | Status: DC | PRN
  Administered 2019-06-16: 500 mL

## 2019-06-16 MED ORDER — ACETAMINOPHEN 500 MG PO TABS
1000.0000 mg | ORAL_TABLET | ORAL | Status: AC
  Administered 2019-06-16: 1000 mg via ORAL
  Filled 2019-06-16: qty 2

## 2019-06-16 MED ORDER — ALBUTEROL SULFATE HFA 108 (90 BASE) MCG/ACT IN AERS
INHALATION_SPRAY | RESPIRATORY_TRACT | Status: AC
  Filled 2019-06-16: qty 6.7

## 2019-06-16 MED ORDER — CEFAZOLIN SODIUM-DEXTROSE 2-4 GM/100ML-% IV SOLN
2.0000 g | INTRAVENOUS | Status: AC
  Administered 2019-06-16: 14:00:00 2 g via INTRAVENOUS
  Filled 2019-06-16: qty 100

## 2019-06-16 MED ORDER — LACTATED RINGERS IV SOLN: INTRAVENOUS | Status: DC

## 2019-06-16 MED ORDER — ONDANSETRON HCL 4 MG/2ML IJ SOLN
INTRAMUSCULAR | Status: DC | PRN
  Administered 2019-06-16: 4 mg via INTRAVENOUS

## 2019-06-16 MED ORDER — ALBUTEROL SULFATE HFA 108 (90 BASE) MCG/ACT IN AERS
INHALATION_SPRAY | RESPIRATORY_TRACT | Status: DC | PRN
  Administered 2019-06-16: 12 via RESPIRATORY_TRACT

## 2019-06-16 SURGICAL SUPPLY — 42 items
ADH SKN CLS APL DERMABOND .7 (GAUZE/BANDAGES/DRESSINGS) ×1
APL PRP STRL LF DISP 70% ISPRP (MISCELLANEOUS) ×1
BAG DECANTER FOR FLEXI CONT (MISCELLANEOUS) ×2 IMPLANT
CANISTER SUCT 3000ML PPV (MISCELLANEOUS) IMPLANT
CHLORAPREP W/TINT 26 (MISCELLANEOUS) ×2 IMPLANT
COVER SURGICAL LIGHT HANDLE (MISCELLANEOUS) ×2 IMPLANT
COVER TRANSDUCER ULTRASND GEL (DRAPE) IMPLANT
COVER WAND RF STERILE (DRAPES) ×2 IMPLANT
DECANTER SPIKE VIAL GLASS SM (MISCELLANEOUS) ×4 IMPLANT
DERMABOND ADVANCED (GAUZE/BANDAGES/DRESSINGS) ×1
DERMABOND ADVANCED .7 DNX12 (GAUZE/BANDAGES/DRESSINGS) ×1 IMPLANT
DRAPE C-ARM 42X72 X-RAY (DRAPES) ×2 IMPLANT
DRAPE CHEST BREAST 15X10 FENES (DRAPES) ×2 IMPLANT
DRAPE WARM FLUID 44X44 (DRAPES) IMPLANT
ELECT COATED BLADE 2.86 ST (ELECTRODE) ×2 IMPLANT
ELECT REM PT RETURN 9FT ADLT (ELECTROSURGICAL) ×2
ELECTRODE REM PT RTRN 9FT ADLT (ELECTROSURGICAL) ×1 IMPLANT
GAUZE 4X4 16PLY RFD (DISPOSABLE) ×2 IMPLANT
GEL ULTRASOUND 20GR AQUASONIC (MISCELLANEOUS) IMPLANT
GLOVE BIO SURGEON STRL SZ 6 (GLOVE) ×2 IMPLANT
GLOVE INDICATOR 6.5 STRL GRN (GLOVE) ×2 IMPLANT
GOWN STRL REUS W/ TWL LRG LVL3 (GOWN DISPOSABLE) ×1 IMPLANT
GOWN STRL REUS W/TWL 2XL LVL3 (GOWN DISPOSABLE) ×2 IMPLANT
GOWN STRL REUS W/TWL LRG LVL3 (GOWN DISPOSABLE) ×2
KIT BASIN OR (CUSTOM PROCEDURE TRAY) ×2 IMPLANT
KIT PORT POWER 8FR ISP CVUE (Port) ×1 IMPLANT
KIT TURNOVER KIT B (KITS) ×2 IMPLANT
NEEDLE 22X1 1/2 (OR ONLY) (NEEDLE) ×2 IMPLANT
NS IRRIG 1000ML POUR BTL (IV SOLUTION) ×2 IMPLANT
PAD ARMBOARD 7.5X6 YLW CONV (MISCELLANEOUS) ×2 IMPLANT
PENCIL BUTTON HOLSTER BLD 10FT (ELECTRODE) ×2 IMPLANT
POSITIONER HEAD DONUT 9IN (MISCELLANEOUS) ×2 IMPLANT
SUT MON AB 4-0 PC3 18 (SUTURE) ×2 IMPLANT
SUT PROLENE 2 0 SH DA (SUTURE) ×4 IMPLANT
SUT VIC AB 3-0 SH 27 (SUTURE) ×2
SUT VIC AB 3-0 SH 27X BRD (SUTURE) ×1 IMPLANT
SYR 5ML LUER SLIP (SYRINGE) ×2 IMPLANT
TOWEL GREEN STERILE (TOWEL DISPOSABLE) ×2 IMPLANT
TOWEL GREEN STERILE FF (TOWEL DISPOSABLE) ×2 IMPLANT
TRAY LAPAROSCOPIC MC (CUSTOM PROCEDURE TRAY) ×2 IMPLANT
TUBE CONNECTING 12X1/4 (SUCTIONS) IMPLANT
YANKAUER SUCT BULB TIP NO VENT (SUCTIONS) IMPLANT

## 2019-06-16 NOTE — Transfer of Care (Signed)
Immediate Anesthesia Transfer of Care Note  Patient: Kathy Reese  Procedure(s) Performed: INSERTION PORT-A-CATH (Left Chest)  Patient Location: PACU  Anesthesia Type:General  Level of Consciousness: awake, oriented and patient cooperative  Airway & Oxygen Therapy: Patient Spontanous Breathing and Patient connected to face mask oxygen  Post-op Assessment: Report given to RN and Post -op Vital signs reviewed and stable  Post vital signs: Reviewed  Last Vitals:  Vitals Value Taken Time  BP 179/80 06/16/19 1504  Temp    Pulse 66 06/16/19 1512  Resp 17 06/16/19 1512  SpO2 100 % 06/16/19 1512  Vitals shown include unvalidated device data.  Last Pain:  Vitals:   06/16/19 1121  PainSc: 0-No pain         Complications: No apparent anesthesia complications

## 2019-06-16 NOTE — Op Note (Signed)
PREOPERATIVE DIAGNOSIS:  Right breast cancer     POSTOPERATIVE DIAGNOSIS:  Same     PROCEDURE: left subclavian port placement, Bard ClearVue Power Port, MRI safe, 8-French.      SURGEON:  Stark Klein, MD      ANESTHESIA:  General   FINDINGS:  Good venous return, easy flush, and tip of the catheter and   SVC 22 cm.      SPECIMEN:  None.      ESTIMATED BLOOD LOSS:  Minimal.      COMPLICATIONS:  None known.      PROCEDURE:  Pt was identified in the holding area and taken to   the operating room, where patient was placed supine on the operating room   table.  General anesthesia was induced.  Patient's arms were tucked and the upper   chest and neck were prepped and draped in sterile fashion.  Time-out was   performed according to the surgical safety check list.  When all was   correct, we continued.   Local anesthetic was administered over this   area at the angle of the clavicle.  The vein was accessed with 1 pass(es) of the needle. There was good venous return and the wire passed easily with no ectopy.   Fluoroscopy was used to confirm that the wire was in the vena cava.      The patient was placed back level and the area for the pocket was anethetized   with local anesthetic.  A 3-cm transverse incision was made with a #15   blade.  Cautery was used to divide the subcutaneous tissues down to the   pectoralis muscle.  An [employer] retractor was used to elevate the skin   while a pocket was created on top of the pectoralis fascia.  The port   was placed into the pocket to confirm that it was of adequate size.  The   catheter was preattached to the port.  The port was then secured to the   pectoralis fascia with four 2-0 Prolene sutures.  These were clamped and   not tied down yet.    The catheter was tunneled through to the wire exit   site.  The catheter was placed along the wire to determine what length it should be to be in the SVC.  The catheter was cut at 22 cm.  The  tunneler sheath and dilator were passed over the wire and the dilator and wire were removed.  The catheter was advanced through the tunneler sheath and the tunneler sheath was pulled away.  Care was taken to keep the catheter in the tunneler sheath as this occurred. This was advanced and the tunneler sheath was removed.  There was good venous   return and easy flush of the catheter.  The Prolene sutures were tied   down to the pectoral fascia.  The skin was reapproximated using 3-0   Vicryl interrupted deep dermal sutures.    Fluoroscopy was used to re-confirm good position of the catheter.  The skin   was then closed using 4-0 Monocryl in a subcuticular fashion.  The port was flushed with concentrated heparin flush as well.  The wounds were then cleaned, dried, and dressed with Dermabond.  The patient was awakened from anesthesia and taken to the PACU in stable condition.  Needle, sponge, and instrument counts were correct.               Stark Klein, MD

## 2019-06-16 NOTE — Anesthesia Postprocedure Evaluation (Signed)
Anesthesia Post Note  Patient: Kathy Reese  Procedure(s) Performed: INSERTION PORT-A-CATH (Left Chest)     Patient location during evaluation: PACU Anesthesia Type: General Level of consciousness: awake and alert and oriented Pain management: pain level controlled Vital Signs Assessment: post-procedure vital signs reviewed and stable Respiratory status: spontaneous breathing, nonlabored ventilation and respiratory function stable Cardiovascular status: blood pressure returned to baseline Postop Assessment: no apparent nausea or vomiting Anesthetic complications: no    Last Vitals:  Vitals:   06/16/19 1534 06/16/19 1604  BP: (!) 156/74 (!) 169/69  Pulse: 63 62  Resp: 19   Temp:    SpO2: 94% 99%    Last Pain:  Vitals:   06/16/19 1534  PainSc: 0-No pain                 Brennan Bailey

## 2019-06-16 NOTE — Anesthesia Preprocedure Evaluation (Addendum)
Anesthesia Evaluation  Patient identified by MRN, date of birth, ID band Patient awake    Reviewed: Allergy & Precautions, NPO status , Patient's Chart, lab work & pertinent test results, reviewed documented beta blocker date and time   History of Anesthesia Complications Negative for: history of anesthetic complications  Airway Mallampati: II  TM Distance: >3 FB Neck ROM: Full    Dental no notable dental hx. (+) Teeth Intact, Dental Advisory Given   Pulmonary sleep apnea ,    Pulmonary exam normal        Cardiovascular hypertension, Pt. on medications and Pt. on home beta blockers Normal cardiovascular exam     Neuro/Psych Depression negative neurological ROS  negative psych ROS   GI/Hepatic negative GI ROS, Neg liver ROS,   Endo/Other  diabetes, Type 2, Oral Hypoglycemic Agents, Insulin Dependent  Renal/GU negative Renal ROS  negative genitourinary   Musculoskeletal  (+) Arthritis ,   Abdominal   Peds  Hematology negative hematology ROS (+)   Anesthesia Other Findings Breast ca  Reproductive/Obstetrics negative OB ROS                           Anesthesia Physical Anesthesia Plan  ASA: II  Anesthesia Plan: General   Post-op Pain Management:    Induction: Intravenous  PONV Risk Score and Plan: 3 and Ondansetron, Dexamethasone, Treatment may vary due to age or medical condition, Propofol infusion and Midazolam  Airway Management Planned: LMA  Additional Equipment: None  Intra-op Plan:   Post-operative Plan: Extubation in OR  Informed Consent: I have reviewed the patients History and Physical, chart, labs and discussed the procedure including the risks, benefits and alternatives for the proposed anesthesia with the patient or authorized representative who has indicated his/her understanding and acceptance.     Dental advisory given  Plan Discussed with: CRNA  Anesthesia  Plan Comments:        Anesthesia Quick Evaluation

## 2019-06-16 NOTE — Anesthesia Procedure Notes (Signed)
Procedure Name: LMA Insertion Date/Time: 06/16/2019 1:37 PM Performed by: Jenne Campus, CRNA Pre-anesthesia Checklist: Patient identified, Emergency Drugs available, Suction available and Patient being monitored Patient Re-evaluated:Patient Re-evaluated prior to induction Oxygen Delivery Method: Circle System Utilized Preoxygenation: Pre-oxygenation with 100% oxygen Induction Type: IV induction Ventilation: Mask ventilation without difficulty LMA: LMA inserted LMA Size: 4.0 Number of attempts: 1 Airway Equipment and Method: Bite block Placement Confirmation: positive ETCO2 and breath sounds checked- equal and bilateral Tube secured with: Tape Dental Injury: Teeth and Oropharynx as per pre-operative assessment

## 2019-06-16 NOTE — Discharge Instructions (Signed)
[facility] °Office Phone Number [phone] ° ° POST OP INSTRUCTIONS ° °Always review your discharge instruction sheet given to you by the facility where your surgery was performed. ° °IF YOU HAVE DISABILITY OR FAMILY LEAVE FORMS, YOU MUST BRING THEM TO THE OFFICE FOR PROCESSING.  DO NOT GIVE THEM TO YOUR DOCTOR. ° °1. A prescription for pain medication may be given to you upon discharge.  Take your pain medication as prescribed, if needed.  If narcotic pain medicine is not needed, then you may take acetaminophen (Tylenol) or ibuprofen (Advil) as needed. °2. Take your usually prescribed medications unless otherwise directed °3. If you need a refill on your pain medication, please contact your pharmacy.  They will contact our office to request authorization.  Prescriptions will not be filled after 5pm or on week-ends. °4. You should eat very light the first 24 hours after surgery, such as soup, crackers, pudding, etc.  Resume your normal diet the day after surgery °5. It is common to experience some constipation if taking pain medication after surgery.  Increasing fluid intake and taking a stool softener will usually help or prevent this problem from occurring.  A mild laxative (Milk of Magnesia or Miralax) should be taken according to package directions if there are no bowel movements after 48 hours. °6. You may shower in 48 hours.  The surgical glue will flake off in 2-3 weeks.   °7. ACTIVITIES:  No strenuous activity or heavy lifting for 1 week.   °a. You may drive when you no longer are taking prescription pain medication, you can comfortably wear a seatbelt, and you can safely maneuver your car and apply brakes. °b. RETURN TO WORK:  __________to be determined._______________ °You should see your doctor in the office for a follow-up appointment approximately three-four weeks after your surgery.   ° °WHEN TO CALL YOUR DOCTOR: °1. Fever over 101.0 °2. Nausea and/or vomiting. °3. Extreme swelling  or bruising. °4. Continued bleeding from incision. °5. Increased pain, redness, or drainage from the incision. ° °The clinic staff is available to answer your questions during regular business hours.  Please don’t hesitate to call and ask to speak to one of the nurses for clinical concerns.  If you have a medical emergency, go to the nearest emergency room or call 911.  A surgeon from [facility] is always on call at the hospital. ° °For further questions, please visit [email or web address]  ° °

## 2019-06-16 NOTE — Anesthesia Procedure Notes (Signed)
Procedure Name: Intubation Date/Time: 06/16/2019 2:21 PM Performed by: Jenne Campus, CRNA Pre-anesthesia Checklist: Patient identified, Emergency Drugs available, Suction available and Patient being monitored Patient Re-evaluated:Patient Re-evaluated prior to induction Oxygen Delivery Method: Circle System Utilized Preoxygenation: Pre-oxygenation with 100% oxygen Induction Type: IV induction Laryngoscope Size: Miller and 3 Grade View: Grade III Tube type: Oral Tube size: 7.0 mm Number of attempts: 1 Airway Equipment and Method: Stylet and Oral airway Placement Confirmation: ETT inserted through vocal cords under direct vision,  positive ETCO2 and breath sounds checked- equal and bilateral Secured at: 21 cm Tube secured with: Tape Dental Injury: Teeth and Oropharynx as per pre-operative assessment

## 2019-06-16 NOTE — Interval H&P Note (Signed)
History and Physical Interval Note:  06/16/2019 12:50 PM  Kathy Reese  has presented today for surgery, with the diagnosis of BREAST CANCER.  The various methods of treatment have been discussed with the patient and family. After consideration of risks, benefits and other options for treatment, the patient has consented to  Procedure(s): INSERTION PORT-A-CATH WITH ULTRASOUND GUIDANCE (N/A) as a surgical intervention.  The patient's history has been reviewed, patient examined, no change in status, stable for surgery.  I have reviewed the patient's chart and labs.  Questions were answered to the patient's satisfaction.     Stark Klein

## 2019-06-19 ENCOUNTER — Ambulatory Visit: Payer: Medicare Other

## 2019-06-19 ENCOUNTER — Ambulatory Visit: Payer: Medicare Other | Admitting: Radiation Oncology

## 2019-06-19 ENCOUNTER — Encounter (HOSPITAL_COMMUNITY): Payer: Self-pay | Admitting: General Surgery

## 2019-06-22 ENCOUNTER — Encounter: Payer: Self-pay | Admitting: Medical Oncology

## 2019-06-22 ENCOUNTER — Other Ambulatory Visit: Payer: Self-pay

## 2019-06-22 ENCOUNTER — Inpatient Hospital Stay: Payer: Medicare Other

## 2019-06-22 DIAGNOSIS — C50211 Malignant neoplasm of upper-inner quadrant of right female breast: Secondary | ICD-10-CM

## 2019-06-22 NOTE — Research (Signed)
[id number]: Referral Patient here this morning for chemo education and agree to meet with me regarding study. I spoke with patient about the study again and inquired if she had any questions. Patient asked about what the study visit and assessments entailed and this information was provided to her. Patient was also informed that there is mandatory study research blood collection and patient inquired as to what the blood collection was for. I read the information, out of the consent form, for the patient, under the title of What exams, tests and procedures are involved in this study, blood samples. Patient asked if she could have tonight to think about the study and inform me tomorrow of her decision. I gave patient my understanding and informed her that would be fine and reminded her that study involvement is voluntary. I also reminded her that Monday would be the last day to be able to enroll her on the study since she starts chemotherapy on Tuesday and we would still need to consent her and complete the baseline assessments. Patient gave her verbal understanding. All patient's questions answered to her satisfaction. I offered patient another copy of the study consent form for review and patient stated she still has the initial one I provided to her at our first meet. Patient confirms she has my contact information. Patient thanked for her time and interest in study and encouraged to call with questions/concerns.  Maxwell Marion, RN, BSN, [facility] Clinical Research 06/22/2019 10:53 AM

## 2019-06-23 NOTE — Progress Notes (Signed)
The following biosimilar Kanjinti (trastuzumab-anns) has been selected for use in this patient.  Kennith Center, Pharm.D., CPP 06/23/2019@9 :46 AM

## 2019-06-26 NOTE — Progress Notes (Signed)
Patient Care Team: Nolene Ebbs, MD as PCP - General (Internal Medicine) Mauro Kaufmann, RN as Oncology Nurse Navigator Rockwell Germany, RN as Oncology Nurse Navigator  DIAGNOSIS:    ICD-10-CM   1. Malignant neoplasm of upper-inner quadrant of right breast in female, estrogen receptor positive ([place])  C50.211    Z17.0     SUMMARY OF ONCOLOGIC HISTORY: Oncology History  Malignant neoplasm of upper-inner quadrant of right breast in female, estrogen receptor positive ([place])  04/12/2019 Cancer Staging   Staging form: Breast, AJCC 8th Edition - Clinical stage from 04/12/2019: Stage IA (cT1b, cN0, cM0, G3, ER+, PR-, HER2+) - Signed by Gardenia Phlegm, NP on 04/19/2019   04/19/2019 Initial Diagnosis   Screening mammogram detected right breast asymmetry. Diagnostic mammogram and US showed 2 indeterminate right breast masses, 70m at 1 o'clock, 725mat 12 o'clock, with no axillary adenopathy. 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    05/30/2019 Surgery   Right lumpectomy (BBarry Dienes IDC with DCIS, grade 3, 0.6cm, clear margins, 3 axillary lymph nodes negative.    06/06/2019 Cancer Staging   Staging form: Breast, AJCC 8th Edition - Pathologic: Stage IA (pT1b, pN0, cM0, G3, ER+, PR-, HER2+) - Signed by GuNicholas LoseMD on 06/06/2019   06/27/2019 -  Chemotherapy   The patient had PACLitaxel (TAXOL) 174 mg in sodium chloride 0.9 % 250 mL chemo infusion (</= 8029m2), 80 mg/m2 = 174 mg, Intravenous,  Once, 0 of 3 cycles trastuzumab-anns (KANJINTI) 399 mg in sodium chloride 0.9 % 250 mL chemo infusion, 4 mg/kg = 399 mg (100 % of original dose 4 mg/kg), Intravenous,  Once, 0 of 16 cycles Dose modification: 4 mg/kg (original dose 4 mg/kg, Cycle 1, Reason: Other (see comments), Comment: Biosimilar Conversion; [facility] (original dose 2 mg/kg, Cycle 3, Reason: Other (see comments), Comment: switch to maintenance q3 weeks), 600 mg (original dose 2 mg/kg,  Cycle 3, Reason: Other (see comments), Comment: maint q3 weeks)  for chemotherapy treatment.      CHIEF COMPLIANT: Cycle 1 Taxol Herceptin  INTERVAL HISTORY: Kathy Reese a [age]o. with above-mentioned history of right breast cancer who underwent a lumpectomy. Her port was placed by Dr. ByeBarry Dienes 06/16/19. Echo on 06/13/19 showed an ejection fraction of 60-65%. She is currently on adjuvant chemotherapy with weekly Taxol and Herceptin. She presents to the clinic today for treatment.   REVIEW OF SYSTEMS:   Constitutional: Denies fevers, chills or abnormal weight loss Eyes: Denies blurriness of vision Ears, nose, mouth, throat, and face: Denies mucositis or sore throat Respiratory: Denies cough, dyspnea or wheezes Cardiovascular: Denies palpitation, chest discomfort Gastrointestinal: Denies nausea, heartburn or change in bowel habits Skin: Denies abnormal skin rashes Lymphatics: Denies new lymphadenopathy or easy bruising Neurological: Denies numbness, tingling or new weaknesses Behavioral/Psych: Mood is stable, no new changes  Extremities: No lower extremity edema Breast: denies any pain or lumps or nodules in either breasts All other systems were reviewed with the patient and are negative.  I have reviewed the past medical history, past surgical history, social history and family history with the patient and they are unchanged from previous note.  ALLERGIES:  has No Known Allergies.  MEDICATIONS:  Current Outpatient Medications  Medication Sig Dispense Refill  . aspirin EC 81 MG tablet Take 81 mg by mouth daily.    . aMarland Kitchenorvastatin (LIPITOR) 40 MG tablet Take 40 mg by mouth daily at 6  PM.     . Calcium Carb-Cholecalciferol (CALCIUM 600+D3 PO) Take 1 tablet by mouth 2 (two) times a day.    . cholecalciferol (VITAMIN D3) 25 MCG (1000 UT) tablet Take 1,000 Units by mouth daily.    . Cinnamon 500 MG capsule Take 500 mg by mouth daily.     . citalopram (CELEXA) 10 MG tablet Take 10  mg by mouth daily.    . insulin detemir (LEVEMIR) 100 UNIT/ML injection Inject 12-15 Units into the skin See admin instructions. Inject 15 units subcutaneously in the morning & 12 units subcutaneously at night.    . lidocaine-prilocaine (EMLA) cream Apply to affected area once 30 g 3  . LORazepam (ATIVAN) 0.5 MG tablet Take 1 tablet (0.5 mg total) by mouth at bedtime as needed for sleep. 30 tablet 0  . losartan-hydrochlorothiazide (HYZAAR) 100-25 MG tablet Take 1 tablet by mouth daily.  4  . meloxicam (MOBIC) 15 MG tablet Take 15 mg by mouth daily as needed for pain.     . metFORMIN (GLUCOPHAGE) 500 MG tablet Take 500 mg by mouth every morning.     . metoprolol (TOPROL-XL) 50 MG 24 hr tablet Take 50 mg by mouth daily.      . ondansetron (ZOFRAN) 8 MG tablet Take 1 tablet (8 mg total) by mouth 2 (two) times daily as needed (Nausea or vomiting). 30 tablet 1  . oxyCODONE (OXY IR/ROXICODONE) 5 MG immediate release tablet Take 1 tablet (5 mg total) by mouth every 6 (six) hours as needed for severe pain. (Patient not taking: Reported on 06/14/2019) 20 tablet 0  . prochlorperazine (COMPAZINE) 10 MG tablet Take 1 tablet (10 mg total) by mouth every 6 (six) hours as needed (Nausea or vomiting). 30 tablet 1   No current facility-administered medications for this visit.     PHYSICAL EXAMINATION: ECOG PERFORMANCE STATUS: 0 - Asymptomatic  Vitals:   06/27/19 0849  BP: (!) 191/61  Pulse: (!) 55  Resp: 17  Temp: 98.6 F (37 C)  SpO2: 100%   Filed Weights   06/27/19 0849  Weight: 214 lb 3.2 oz (97.2 kg)    GENERAL: alert, no distress and comfortable SKIN: skin color, texture, turgor are normal, no rashes or significant lesions EYES: normal, Conjunctiva are pink and non-injected, sclera clear OROPHARYNX: no exudate, no erythema and lips, buccal mucosa, and tongue normal  NECK: supple, thyroid normal size, non-tender, without nodularity LYMPH: no palpable lymphadenopathy in the cervical, axillary or  inguinal LUNGS: clear to auscultation and percussion with normal breathing effort HEART: regular rate & rhythm and no murmurs and no lower extremity edema ABDOMEN: abdomen soft, non-tender and normal bowel sounds MUSCULOSKELETAL: no cyanosis of digits and no clubbing  NEURO: alert & oriented x 3 with fluent speech, no focal motor/sensory deficits EXTREMITIES: No lower extremity edema  LABORATORY DATA:  I have reviewed the data as listed CMP Latest Ref Rng & Units 06/27/2019 06/16/2019 05/25/2019  Glucose 70 - 99 mg/dL 132(H) 114(H) 139(H)  BUN 8 - 23 mg/dL 24(H) 21 20  Creatinine 0.44 - 1.00 mg/dL 0.85 0.90 0.86  Sodium 135 - 145 mmol/L 141 138 139  Potassium 3.5 - 5.1 mmol/L 3.9 3.7 4.0  Chloride 98 - 111 mmol/L 105 104 104  CO2 22 - 32 mmol/L 26 24 25   Calcium 8.9 - 10.3 mg/dL 9.6 9.4 9.5  Total Protein 6.5 - 8.1 g/dL 7.0 - -  Total Bilirubin 0.3 - 1.2 mg/dL 0.9 - -  Alkaline Phos  38 - 126 U/L 118 - -  AST 15 - 41 U/L 15 - -  ALT 0 - 44 U/L 15 - -    Lab Results  Component Value Date   WBC 7.2 06/27/2019   HGB 11.4 (L) 06/27/2019   HCT 34.7 (L) 06/27/2019   MCV 90.8 06/27/2019   PLT 185 06/27/2019   NEUTROABS 3.6 06/27/2019    ASSESSMENT & PLAN:  Malignant neoplasm of upper-inner quadrant of right breast in female, estrogen receptor positive ([place]) 04/19/2019:Screening mammogram detected right breast asymmetry. Diagnostic mammogram and US showed 2 indeterminate right breast masses, 31m at 1 o'clock, 716mat 12 o'clock, with no axillary adenopathy. 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 Stage Ia  05/30/2019: [facility] DC[facility], grade 3, 0.6cm, clear margins, 3 axillary lymph nodes negative.  HER-2 positive (3+), ER+ (80%), PR -, Ki67 40%. Stage Ia  Treatment plan: 1. Adjuvant chemo with Taxol Herceptin plan to start 06/27/2019 2. Adjuvant radiation therapy followed by 3. Adjuvant antiestrogen  therapy ------------------------------------------------------------------------------------------------------------------------------------------------------- Current treatment: Cycle 1 day 1 Taxol Herceptin Chemo consent obtained Chemo education completed Labs reviewed Antiemetics were reviewed Echocardiogram EF 60 to 65% Return to clinic in 1 week for toxicity evaluation   No orders of the defined types were placed in this encounter.  The patient has a good understanding of the overall plan. she agrees with it. she will call with any problems that may develop before the next visit here.  GuNicholas LoseMD 06/27/2019  I,Julious Okaorshimer am acting as scribe for Dr. ViNicholas Lose I have reviewed the above documentation for accuracy and completeness, and I agree with the above.

## 2019-06-27 ENCOUNTER — Telehealth: Payer: Self-pay | Admitting: *Deleted

## 2019-06-27 ENCOUNTER — Other Ambulatory Visit: Payer: Self-pay

## 2019-06-27 ENCOUNTER — Inpatient Hospital Stay: Payer: Medicare Other

## 2019-06-27 ENCOUNTER — Encounter: Payer: Self-pay | Admitting: Hematology and Oncology

## 2019-06-27 ENCOUNTER — Inpatient Hospital Stay (HOSPITAL_BASED_OUTPATIENT_CLINIC_OR_DEPARTMENT_OTHER): Payer: Medicare Other | Admitting: Hematology and Oncology

## 2019-06-27 VITALS — BP 158/65 | HR 51 | Temp 98.6°F | Resp 16

## 2019-06-27 DIAGNOSIS — Z170 Estrogen receptor positive status [ER+]: Secondary | ICD-10-CM

## 2019-06-27 DIAGNOSIS — C50211 Malignant neoplasm of upper-inner quadrant of right female breast: Secondary | ICD-10-CM

## 2019-06-27 DIAGNOSIS — Z95828 Presence of other vascular implants and grafts: Secondary | ICD-10-CM

## 2019-06-27 DIAGNOSIS — Z23 Encounter for immunization: Secondary | ICD-10-CM

## 2019-06-27 DIAGNOSIS — Z5112 Encounter for antineoplastic immunotherapy: Secondary | ICD-10-CM | POA: Diagnosis present

## 2019-06-27 DIAGNOSIS — Z5111 Encounter for antineoplastic chemotherapy: Secondary | ICD-10-CM | POA: Diagnosis present

## 2019-06-27 LAB — CMP (CANCER CENTER ONLY)
ALT: 15 U/L (ref 0–44)
AST: 15 U/L (ref 15–41)
Albumin: 4.1 g/dL (ref 3.5–5.0)
Alkaline Phosphatase: 118 U/L (ref 38–126)
Anion gap: 10 (ref 5–15)
BUN: 24 mg/dL — ABNORMAL HIGH (ref 8–23)
CO2: 26 mmol/L (ref 22–32)
Calcium: 9.6 mg/dL (ref 8.9–10.3)
Chloride: 105 mmol/L (ref 98–111)
Creatinine: 0.85 mg/dL (ref 0.44–1.00)
GFR, Est AFR Am: >60 mL/min (ref >60)
GFR, Estimated: >60 mL/min (ref >60)
Glucose, Bld: 132 mg/dL — ABNORMAL HIGH (ref 70–99)
Potassium: 3.9 mmol/L (ref 3.5–5.1)
Sodium: 141 mmol/L (ref 135–145)
Total Bilirubin: 0.9 mg/dL (ref 0.3–1.2)
Total Protein: 7 g/dL (ref 6.5–8.1)

## 2019-06-27 LAB — CBC WITH DIFFERENTIAL (CANCER CENTER ONLY)
Abs Immature Granulocytes: 0.02 10*3/uL (ref 0.00–0.07)
Basophils Absolute: 0 10*3/uL (ref 0.0–0.1)
Basophils Relative: 1 %
Eosinophils Absolute: 0.2 10*3/uL (ref 0.0–0.5)
Eosinophils Relative: 3 %
HCT: 34.7 % — ABNORMAL LOW (ref 36.0–46.0)
Hemoglobin: 11.4 g/dL — ABNORMAL LOW (ref 12.0–15.0)
Immature Granulocytes: 0 %
Lymphocytes Relative: 36 %
Lymphs Abs: 2.6 10*3/uL (ref 0.7–4.0)
MCH: 29.8 pg (ref 26.0–34.0)
MCHC: 32.9 g/dL (ref 30.0–36.0)
MCV: 90.8 fL (ref 80.0–100.0)
Monocytes Absolute: 0.7 10*3/uL (ref 0.1–1.0)
Monocytes Relative: 10 %
Neutro Abs: 3.6 10*3/uL (ref 1.7–7.7)
Neutrophils Relative %: 50 %
Platelet Count: 185 10*3/uL (ref 150–400)
RBC: 3.82 MIL/uL — ABNORMAL LOW (ref 3.87–5.11)
RDW: 13.3 % (ref 11.5–15.5)
WBC Count: 7.2 10*3/uL (ref 4.0–10.5)
nRBC: 0 % (ref 0.0–0.2)

## 2019-06-27 MED ORDER — FAMOTIDINE IN NACL 20-0.9 MG/50ML-% IV SOLN
INTRAVENOUS | Status: AC
  Filled 2019-06-27: qty 50

## 2019-06-27 MED ORDER — DIPHENHYDRAMINE HCL 50 MG/ML IJ SOLN
25.0000 mg | Freq: Once | INTRAMUSCULAR | Status: AC
  Administered 2019-06-27: 25 mg via INTRAVENOUS

## 2019-06-27 MED ORDER — ACETAMINOPHEN 325 MG PO TABS
650.0000 mg | ORAL_TABLET | Freq: Once | ORAL | Status: AC
  Administered 2019-06-27: 650 mg via ORAL

## 2019-06-27 MED ORDER — SODIUM CHLORIDE 0.9 % IV SOLN
Freq: Once | INTRAVENOUS | Status: AC
  Administered 2019-06-27: 10:00:00 via INTRAVENOUS
  Filled 2019-06-27: qty 250

## 2019-06-27 MED ORDER — HEPARIN SOD (PORK) LOCK FLUSH 100 UNIT/ML IV SOLN
500.0000 [IU] | Freq: Once | INTRAVENOUS | Status: AC | PRN
  Administered 2019-06-27: 500 [IU]
  Filled 2019-06-27: qty 5

## 2019-06-27 MED ORDER — FAMOTIDINE IN NACL 20-0.9 MG/50ML-% IV SOLN
20.0000 mg | Freq: Once | INTRAVENOUS | Status: AC
  Administered 2019-06-27: 10:00:00 20 mg via INTRAVENOUS

## 2019-06-27 MED ORDER — ACETAMINOPHEN 325 MG PO TABS
ORAL_TABLET | ORAL | Status: AC
  Filled 2019-06-27: qty 2

## 2019-06-27 MED ORDER — SODIUM CHLORIDE 0.9 % IV SOLN
80.0000 mg/m2 | Freq: Once | INTRAVENOUS | Status: AC
  Administered 2019-06-27: 174 mg via INTRAVENOUS
  Filled 2019-06-27: qty 29

## 2019-06-27 MED ORDER — TRASTUZUMAB-ANNS CHEMO 150 MG IV SOLR
4.0000 mg/kg | Freq: Once | INTRAVENOUS | Status: AC
  Administered 2019-06-27: 399 mg via INTRAVENOUS
  Filled 2019-06-27: qty 19

## 2019-06-27 MED ORDER — INFLUENZA VAC A&B SA ADJ QUAD 0.5 ML IM PRSY
0.5000 mL | PREFILLED_SYRINGE | Freq: Once | INTRAMUSCULAR | Status: AC
  Administered 2019-06-27: 0.5 mL via INTRAMUSCULAR

## 2019-06-27 MED ORDER — INFLUENZA VAC A&B SA ADJ QUAD 0.5 ML IM PRSY
PREFILLED_SYRINGE | INTRAMUSCULAR | Status: AC
  Filled 2019-06-27: qty 0.5

## 2019-06-27 MED ORDER — ONDANSETRON HCL 8 MG PO TABS
ORAL_TABLET | ORAL | Status: AC
  Filled 2019-06-27: qty 1

## 2019-06-27 MED ORDER — ONDANSETRON HCL 8 MG PO TABS
8.0000 mg | ORAL_TABLET | Freq: Once | ORAL | Status: AC
  Administered 2019-06-27: 8 mg via ORAL

## 2019-06-27 MED ORDER — SODIUM CHLORIDE 0.9% FLUSH
10.0000 mL | INTRAVENOUS | Status: DC | PRN
  Administered 2019-06-27: 15:00:00 10 mL
  Filled 2019-06-27: qty 10

## 2019-06-27 MED ORDER — SODIUM CHLORIDE 0.9% FLUSH
10.0000 mL | INTRAVENOUS | Status: DC | PRN
  Administered 2019-06-27: 10 mL via INTRAVENOUS
  Filled 2019-06-27: qty 10

## 2019-06-27 MED ORDER — DIPHENHYDRAMINE HCL 25 MG PO CAPS
ORAL_CAPSULE | ORAL | Status: AC
  Filled 2019-06-27: qty 1

## 2019-06-27 MED ORDER — DIPHENHYDRAMINE HCL 50 MG/ML IJ SOLN
INTRAMUSCULAR | Status: AC
  Filled 2019-06-27: qty 1

## 2019-06-27 NOTE — Assessment & Plan Note (Signed)
04/19/2019:Screening mammogram detected right breast asymmetry. Diagnostic mammogram and US showed 2 indeterminate right breast masses, 43m at 1 o'clock, 75mat 12 o'clock, with no axillary adenopathy. 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 Stage Ia  05/30/2019: [facility], grade 3, 0.6cm, clear margins, 3 axillary lymph nodes negative.  HER-2 positive (3+), ER+ (80%), PR -, Ki67 40%. Stage Ia  Treatment plan: 1. Adjuvant chemo with Taxol Herceptin plan to start 06/27/2019 2. Adjuvant radiation therapy followed by 3. Adjuvant antiestrogen therapy ------------------------------------------------------------------------------------------------------------------------------------------------------- Current treatment: Cycle 1 day 1 Taxol Herceptin Chemo consent obtained Chemo education completed Labs reviewed Antiemetics were reviewed Echocardiogram EF 60 to 65%

## 2019-06-27 NOTE — Patient Instructions (Signed)

## 2019-06-27 NOTE — Patient Instructions (Signed)
[facility] Discharge Instructions for Patients Receiving Chemotherapy  Today you received the following chemotherapy agents:  Trastuzumab & Taxol  To help prevent nausea and vomiting after your treatment, we encourage you to take your nausea medication as instructed. Paclitaxel injection What is this medicine? PACLITAXEL (PAK li TAX el) is a chemotherapy drug. It targets fast dividing cells, like cancer cells, and causes these cells to die. This medicine is used to treat ovarian cancer, breast cancer, lung cancer, Kaposi's sarcoma, and other cancers. This medicine may be used for other purposes; ask your health care provider or pharmacist if you have questions. COMMON BRAND NAME(S): Onxol, Taxol What should I tell my health care provider before I take this medicine? They need to know if you have any of these conditions:  history of irregular heartbeat  liver disease  low blood counts, like low white cell, platelet, or red cell counts  lung or breathing disease, like asthma  tingling of the fingers or toes, or other nerve disorder  an unusual or allergic reaction to paclitaxel, alcohol, polyoxyethylated castor oil, other chemotherapy, other medicines, foods, dyes, or preservatives  pregnant or trying to get pregnant  breast-feeding How should I use this medicine? This drug is given as an infusion into a vein. It is administered in a hospital or clinic by a specially trained health care professional. Talk to your pediatrician regarding the use of this medicine in children. Special care may be needed. Overdosage: If you think you have taken too much of this medicine contact a poison control center or emergency room at once. NOTE: This medicine is only for you. Do not share this medicine with others. What if I miss a dose? It is important not to miss your dose. Call your doctor or health care professional if you are unable to keep an appointment. What may interact with  this medicine? Do not take this medicine with any of the following medications:  disulfiram  metronidazole This medicine may also interact with the following medications:  antiviral medicines for hepatitis, HIV or AIDS  certain antibiotics like erythromycin and clarithromycin  certain medicines for fungal infections like ketoconazole and itraconazole  certain medicines for seizures like carbamazepine, phenobarbital, phenytoin  gemfibrozil  nefazodone  rifampin  St. John's wort This list may not describe all possible interactions. Give your health care provider a list of all the medicines, herbs, non-prescription drugs, or dietary supplements you use. Also tell them if you smoke, drink alcohol, or use illegal drugs. Some items may interact with your medicine. What should I watch for while using this medicine? Your condition will be monitored carefully while you are receiving this medicine. You will need important blood work done while you are taking this medicine. This medicine can cause serious allergic reactions. To reduce your risk you will need to take other medicine(s) before treatment with this medicine. If you experience allergic reactions like skin rash, itching or hives, swelling of the face, lips, or tongue, tell your doctor or health care professional right away. In some cases, you may be given additional medicines to help with side effects. Follow all directions for their use. This drug may make you feel generally unwell. This is not uncommon, as chemotherapy can affect healthy cells as well as cancer cells. Report any side effects. Continue your course of treatment even though you feel ill unless your doctor tells you to stop. Call your doctor or health care professional for advice if you get a fever,  chills or sore throat, or other symptoms of a cold or flu. Do not treat yourself. This drug decreases your body's ability to fight infections. Try to avoid being around people  who are sick. This medicine may increase your risk to bruise or bleed. Call your doctor or health care professional if you notice any unusual bleeding. Be careful brushing and flossing your teeth or using a toothpick because you may get an infection or bleed more easily. If you have any dental work done, tell your dentist you are receiving this medicine. Avoid taking products that contain aspirin, acetaminophen, ibuprofen, naproxen, or ketoprofen unless instructed by your doctor. These medicines may hide a fever. Do not become pregnant while taking this medicine. Women should inform their doctor if they wish to become pregnant or think they might be pregnant. There is a potential for serious side effects to an unborn child. Talk to your health care professional or pharmacist for more information. Do not breast-feed an infant while taking this medicine. Men are advised not to father a child while receiving this medicine. This product may contain alcohol. Ask your pharmacist or healthcare provider if this medicine contains alcohol. Be sure to tell all healthcare providers you are taking this medicine. Certain medicines, like metronidazole and disulfiram, can cause an unpleasant reaction when taken with alcohol. The reaction includes flushing, headache, nausea, vomiting, sweating, and increased thirst. The reaction can last from 30 minutes to several hours. What side effects may I notice from receiving this medicine? Side effects that you should report to your doctor or health care professional as soon as possible:  allergic reactions like skin rash, itching or hives, swelling of the face, lips, or tongue  breathing problems  changes in vision  fast, irregular heartbeat  high or low blood pressure  mouth sores  pain, tingling, numbness in the hands or feet  signs of decreased platelets or bleeding - bruising, pinpoint red spots on the skin, black, tarry stools, blood in the urine  signs of  decreased red blood cells - unusually weak or tired, feeling faint or lightheaded, falls  signs of infection - fever or chills, cough, sore throat, pain or difficulty passing urine  signs and symptoms of liver injury like dark yellow or brown urine; general ill feeling or flu-like symptoms; light-colored stools; loss of appetite; nausea; right upper belly pain; unusually weak or tired; yellowing of the eyes or skin  swelling of the ankles, feet, hands  unusually slow heartbeat Side effects that usually do not require medical attention (report to your doctor or health care professional if they continue or are bothersome):  diarrhea  hair loss  loss of appetite  muscle or joint pain  nausea, vomiting  pain, redness, or irritation at site where injected  tiredness This list may not describe all possible side effects. Call your doctor for medical advice about side effects. You may report side effects to FDA at 1-800-FDA-1088. Where should I keep my medicine? This drug is given in a hospital or clinic and will not be stored at home. NOTE: This sheet is a summary. It may not cover all possible information. If you have questions about this medicine, talk to your doctor, pharmacist, or health care provider.  2020 Elsevier/Gold Standard (2017-05-25 13:14:55) Trastuzumab injection for infusion What is this medicine? TRASTUZUMAB (tras TOO zoo mab) is a monoclonal antibody. It is used to treat breast cancer and stomach cancer. This medicine may be used for other purposes; ask your health  care provider or pharmacist if you have questions. COMMON BRAND NAME(S): Herceptin, Galvin Proffer, Trazimera What should I tell my health care provider before I take this medicine? They need to know if you have any of these conditions:  heart disease  heart failure  lung or breathing disease, like asthma  an unusual or allergic reaction to trastuzumab, benzyl alcohol, or other  medications, foods, dyes, or preservatives  pregnant or trying to get pregnant  breast-feeding How should I use this medicine? This drug is given as an infusion into a vein. It is administered in a hospital or clinic by a specially trained health care professional. Talk to your pediatrician regarding the use of this medicine in children. This medicine is not approved for use in children. Overdosage: If you think you have taken too much of this medicine contact a poison control center or emergency room at once. NOTE: This medicine is only for you. Do not share this medicine with others. What if I miss a dose? It is important not to miss a dose. Call your doctor or health care professional if you are unable to keep an appointment. What may interact with this medicine? This medicine may interact with the following medications:  certain types of chemotherapy, such as daunorubicin, doxorubicin, epirubicin, and idarubicin This list may not describe all possible interactions. Give your health care provider a list of all the medicines, herbs, non-prescription drugs, or dietary supplements you use. Also tell them if you smoke, drink alcohol, or use illegal drugs. Some items may interact with your medicine. What should I watch for while using this medicine? Visit your doctor for checks on your progress. Report any side effects. Continue your course of treatment even though you feel ill unless your doctor tells you to stop. Call your doctor or health care professional for advice if you get a fever, chills or sore throat, or other symptoms of a cold or flu. Do not treat yourself. Try to avoid being around people who are sick. You may experience fever, chills and shaking during your first infusion. These effects are usually mild and can be treated with other medicines. Report any side effects during the infusion to your health care professional. Fever and chills usually do not happen with later infusions. Do  not become pregnant while taking this medicine or for 7 months after stopping it. Women should inform their doctor if they wish to become pregnant or think they might be pregnant. Women of child-bearing potential will need to have a negative pregnancy test before starting this medicine. There is a potential for serious side effects to an unborn child. Talk to your health care professional or pharmacist for more information. Do not breast-feed an infant while taking this medicine or for 7 months after stopping it. Women must use effective birth control with this medicine. What side effects may I notice from receiving this medicine? Side effects that you should report to your doctor or health care professional as soon as possible:  allergic reactions like skin rash, itching or hives, swelling of the face, lips, or tongue  chest pain or palpitations  cough  dizziness  feeling faint or lightheaded, falls  fever  general ill feeling or flu-like symptoms  signs of worsening heart failure like breathing problems; swelling in your legs and feet  unusually weak or tired Side effects that usually do not require medical attention (report to your doctor or health care professional if they continue or are bothersome):  bone pain  changes in taste  diarrhea  joint pain  nausea/vomiting  weight loss This list may not describe all possible side effects. Call your doctor for medical advice about side effects. You may report side effects to FDA at 1-800-FDA-1088. Where should I keep my medicine? This drug is given in a hospital or clinic and will not be stored at home. NOTE: This sheet is a summary. It may not cover all possible information. If you have questions about this medicine, talk to your doctor, pharmacist, or health care provider.  2020 Elsevier/Gold Standard (2016-09-15 14:37:52)    If you develop nausea and vomiting that is not controlled by your nausea medication, call the  clinic.   BELOW ARE SYMPTOMS THAT SHOULD BE REPORTED IMMEDIATELY:  *FEVER GREATER THAN 100.5 F  *CHILLS WITH OR WITHOUT FEVER  NAUSEA AND VOMITING THAT IS NOT CONTROLLED WITH YOUR NAUSEA MEDICATION  *UNUSUAL SHORTNESS OF BREATH  *UNUSUAL BRUISING OR BLEEDING  TENDERNESS IN MOUTH AND THROAT WITH OR WITHOUT PRESENCE OF ULCERS  *URINARY PROBLEMS  *BOWEL PROBLEMS  UNUSUAL RASH Items with * indicate a potential emergency and should be followed up as soon as possible.  Feel free to call the clinic should you have any questions or concerns. The clinic phone number is [phone].  Please show the [facility] at check-in to the Emergency Department and triage nurse.

## 2019-06-27 NOTE — Telephone Encounter (Signed)
Spoke to pt during 1st Chemo. Denies questions or needs at this time. Relate doing well. Encourage pt to call with needs. Received verbal understanding.

## 2019-06-27 NOTE — Progress Notes (Signed)
Add zofran for antiemetic coverage per Dr Lindi Adie

## 2019-06-27 NOTE — Progress Notes (Signed)
Met w/ pt to introduce myself as her Arboriculturist.  Unfortunately there aren't any foundations offering copay assistance for her Dx and the type of ins she has.  I offered the [employer], went over what it covers, gave her the income requirement and an expense sheet.  Pt would like to apply so she will bring proof of income on 07/04/19.  She has my card for any questions or concerns she may have in the future.

## 2019-07-03 NOTE — Progress Notes (Signed)
Patient Care Team: Nolene Ebbs, MD as PCP - General (Internal Medicine) Mauro Kaufmann, RN as Oncology Nurse Navigator Rockwell Germany, RN as Oncology Nurse Navigator  DIAGNOSIS:    ICD-10-CM   1. Malignant neoplasm of upper-inner quadrant of right breast in female, estrogen receptor positive ([place])  C50.211    Z17.0     SUMMARY OF ONCOLOGIC HISTORY: Oncology History  Malignant neoplasm of upper-inner quadrant of right breast in female, estrogen receptor positive ([place])  04/12/2019 Cancer Staging   Staging form: Breast, AJCC 8th Edition - Clinical stage from 04/12/2019: Stage IA (cT1b, cN0, cM0, G3, ER+, PR-, HER2+) - Signed by Gardenia Phlegm, NP on 04/19/2019   04/19/2019 Initial Diagnosis   Screening mammogram detected right breast asymmetry. Diagnostic mammogram and US showed 2 indeterminate right breast masses, 74m at 1 o'clock, 745mat 12 o'clock, with no axillary adenopathy. 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    05/30/2019 Surgery   Right lumpectomy ([facility] with DCIS, grade 3, 0.6cm, clear margins, 3 axillary lymph nodes negative.    06/06/2019 Cancer Staging   Staging form: Breast, AJCC 8th Edition - Pathologic: Stage IA (pT1b, pN0, cM0, G3, ER+, PR-, HER2+) - Signed by GuNicholas LoseMD on 06/06/2019   06/27/2019 -  Chemotherapy   The patient had PACLitaxel (TAXOL) 174 mg in sodium chloride 0.9 % 250 mL chemo infusion (</= 8082m2), 80 mg/m2 = 174 mg, Intravenous,  Once, 1 of 3 cycles Administration: 174 mg (06/27/2019) trastuzumab-anns (KANJINTI) 399 mg in sodium chloride 0.9 % 250 mL chemo infusion, 4 mg/kg = 399 mg (100 % of original dose 4 mg/kg), Intravenous,  Once, 1 of 16 cycles Dose modification: 4 mg/kg (original dose 4 mg/kg, Cycle 1, Reason: Other (see comments), Comment: Biosimilar Conversion; [facility] (original dose 2 mg/kg, Cycle 3, Reason: Other (see comments), Comment: switch to maintenance q3  weeks), 600 mg (original dose 2 mg/kg, Cycle 3, Reason: Other (see comments), Comment: maint q3 weeks) Administration: 399 mg (06/27/2019)  for chemotherapy treatment.      CHIEF COMPLIANT: Cycle 2 Taxol Herceptin  INTERVAL HISTORY: Kathy Reese a 67 38o. with above-mentioned history of right breast cancer who underwent a lumpectomy. She is currently on adjuvant chemotherapy with weekly Taxol and Herceptin. She presents to the clinic today for cycle 2.  She tolerated cycle 1 extremely well.  She did not have any nausea or vomiting.  REVIEW OF SYSTEMS:   Constitutional: Denies fevers, chills or abnormal weight loss Eyes: Denies blurriness of vision Ears, nose, mouth, throat, and face: Denies mucositis or sore throat Respiratory: Denies cough, dyspnea or wheezes Cardiovascular: Denies palpitation, chest discomfort Gastrointestinal: Denies nausea, heartburn or change in bowel habits Skin: Denies abnormal skin rashes Lymphatics: Denies new lymphadenopathy or easy bruising Neurological: Denies numbness, tingling or new weaknesses Behavioral/Psych: Mood is stable, no new changes  Extremities: No lower extremity edema Breast: denies any pain or lumps or nodules in either breasts All other systems were reviewed with the patient and are negative.  I have reviewed the past medical history, past surgical history, social history and family history with the patient and they are unchanged from previous note.  ALLERGIES:  has No Known Allergies.  MEDICATIONS:  Current Outpatient Medications  Medication Sig Dispense Refill   aspirin EC 81 MG tablet Take 81 mg by mouth daily.     atorvastatin (LIPITOR) 40 MG tablet Take 40  mg by mouth daily at 6 PM.      Calcium Carb-Cholecalciferol (CALCIUM 600+D3 PO) Take 1 tablet by mouth 2 (two) times a day.     cholecalciferol (VITAMIN D3) 25 MCG (1000 UT) tablet Take 1,000 Units by mouth daily.     Cinnamon 500 MG capsule Take 500 mg by mouth  daily.      citalopram (CELEXA) 10 MG tablet Take 10 mg by mouth daily.     insulin detemir (LEVEMIR) 100 UNIT/ML injection Inject 12-15 Units into the skin See admin instructions. Inject 15 units subcutaneously in the morning & 12 units subcutaneously at night.     lidocaine-prilocaine (EMLA) cream Apply to affected area once 30 g 3   LORazepam (ATIVAN) 0.5 MG tablet Take 1 tablet (0.5 mg total) by mouth at bedtime as needed for sleep. 30 tablet 0   losartan-hydrochlorothiazide (HYZAAR) 100-25 MG tablet Take 1 tablet by mouth daily.  4   meloxicam (MOBIC) 15 MG tablet Take 15 mg by mouth daily as needed for pain.      metFORMIN (GLUCOPHAGE) 500 MG tablet Take 500 mg by mouth every morning.      metoprolol (TOPROL-XL) 50 MG 24 hr tablet Take 50 mg by mouth daily.       ondansetron (ZOFRAN) 8 MG tablet Take 1 tablet (8 mg total) by mouth 2 (two) times daily as needed (Nausea or vomiting). 30 tablet 1   oxyCODONE (OXY IR/ROXICODONE) 5 MG immediate release tablet Take 1 tablet (5 mg total) by mouth every 6 (six) hours as needed for severe pain. (Patient not taking: Reported on 06/14/2019) 20 tablet 0   prochlorperazine (COMPAZINE) 10 MG tablet Take 1 tablet (10 mg total) by mouth every 6 (six) hours as needed (Nausea or vomiting). 30 tablet 1   No current facility-administered medications for this visit.    Facility-Administered Medications Ordered in Other Visits  Medication Dose Route Frequency Provider Last Rate Last Dose   famotidine (PEPCID) IVPB 20 mg premix  20 mg Intravenous Once Nicholas Lose, MD 200 mL/hr at 07/04/19 1252 20 mg at 07/04/19 1252   heparin lock flush 100 unit/mL  500 Units Intracatheter Once PRN Nicholas Lose, MD       PACLitaxel (TAXOL) 174 mg in sodium chloride 0.9 % 250 mL chemo infusion (</= 44m/m2)  80 mg/m2 (Treatment Plan Recorded) Intravenous Once GNicholas Lose MD       sodium chloride flush (NS) 0.9 % injection 10 mL  10 mL Intracatheter PRN GNicholas Lose MD       trastuzumab-anns (KANJINTI) 189 mg in sodium chloride 0.9 % 250 mL chemo infusion  2 mg/kg (Treatment Plan Recorded) Intravenous Once GNicholas Lose MD        PHYSICAL EXAMINATION: ECOG PERFORMANCE STATUS: 1 - Symptomatic but completely ambulatory  Vitals:   07/04/19 1135  BP: 135/70  Pulse: (!) 55  Resp: 17  Temp: 98.3 F (36.8 C)  SpO2: 100%   Filed Weights   07/04/19 1135  Weight: 213 lb 12.8 oz (97 kg)    GENERAL: alert, no distress and comfortable SKIN: skin color, texture, turgor are normal, no rashes or significant lesions EYES: normal, Conjunctiva are pink and non-injected, sclera clear OROPHARYNX: no exudate, no erythema and lips, buccal mucosa, and tongue normal  NECK: supple, thyroid normal size, non-tender, without nodularity LYMPH: no palpable lymphadenopathy in the cervical, axillary or inguinal LUNGS: clear to auscultation and percussion with normal breathing effort HEART: regular rate & rhythm and  no murmurs and no lower extremity edema ABDOMEN: abdomen soft, non-tender and normal bowel sounds MUSCULOSKELETAL: no cyanosis of digits and no clubbing  NEURO: alert & oriented x 3 with fluent speech, no focal motor/sensory deficits EXTREMITIES: No lower extremity edema  LABORATORY DATA:  I have reviewed the data as listed CMP Latest Ref Rng & Units 07/04/2019 06/27/2019 06/16/2019  Glucose 70 - 99 mg/dL 104(H) 132(H) 114(H)  BUN 8 - 23 mg/dL 24(H) 24(H) 21  Creatinine 0.44 - 1.00 mg/dL 0.89 0.85 0.90  Sodium 135 - 145 mmol/L 141 141 138  Potassium 3.5 - 5.1 mmol/L 4.0 3.9 3.7  Chloride 98 - 111 mmol/L 106 105 104  CO2 22 - 32 mmol/L 27 26 24   Calcium 8.9 - 10.3 mg/dL 9.4 9.6 9.4  Total Protein 6.5 - 8.1 g/dL 6.9 7.0 -  Total Bilirubin 0.3 - 1.2 mg/dL 0.7 0.9 -  Alkaline Phos 38 - 126 U/L 98 118 -  AST 15 - 41 U/L 22 15 -  ALT 0 - 44 U/L 31 15 -    Lab Results  Component Value Date   WBC 5.6 07/04/2019   HGB 10.1 (L) 07/04/2019   HCT  31.5 (L) 07/04/2019   MCV 91.3 07/04/2019   PLT 213 07/04/2019   NEUTROABS 2.7 07/04/2019    ASSESSMENT & PLAN:  Malignant neoplasm of upper-inner quadrant of right breast in female, estrogen receptor positive ([place]) 04/19/2019:Screening mammogram detected right breast asymmetry. Diagnostic mammogram and US showed 2 indeterminate right breast masses, 75m at 1 o'clock, 751mat 12 o'clock, with no axillary adenopathy. 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 Stage Ia  05/30/2019: [facility], grade 3, 0.6cm, clear margins, 3 axillary lymph nodes negative.HER-2 positive (3+), ER+ (80%), PR -, Ki67 40%. Stage Ia  Treatment plan: 1.Adjuvant chemo with Taxol Herceptinplan to start 06/27/2019 2. Adjuvant radiation therapy followed by 3. Adjuvant antiestrogen therapy ------------------------------------------------------------------------------------------------------------------------------------------------------- Current treatment: Cycle 2 day 1 Taxol Herceptin Labs reviewed Echocardiogram EF 60 to 65%  Chemo toxicities: Chemotherapy-induced anemia: Hemoglobin is 10.1 and will be monitored  Return to clinic weekly for chemo and every other week for follow-up with me.    No orders of the defined types were placed in this encounter.  The patient has a good understanding of the overall plan. she agrees with it. she will call with any problems that may develop before the next visit here.  GuNicholas LoseMD 07/04/2019  I,Julious Okaorshimer am acting as scribe for Dr. ViNicholas Lose I have reviewed the above documentation for accuracy and completeness, and I agree with the above.

## 2019-07-04 ENCOUNTER — Inpatient Hospital Stay: Payer: Medicare Other

## 2019-07-04 ENCOUNTER — Inpatient Hospital Stay (HOSPITAL_BASED_OUTPATIENT_CLINIC_OR_DEPARTMENT_OTHER): Payer: Medicare Other | Admitting: Hematology and Oncology

## 2019-07-04 ENCOUNTER — Other Ambulatory Visit: Payer: Self-pay

## 2019-07-04 DIAGNOSIS — Z170 Estrogen receptor positive status [ER+]: Secondary | ICD-10-CM | POA: Diagnosis not present

## 2019-07-04 DIAGNOSIS — Z5112 Encounter for antineoplastic immunotherapy: Secondary | ICD-10-CM | POA: Diagnosis not present

## 2019-07-04 DIAGNOSIS — C50211 Malignant neoplasm of upper-inner quadrant of right female breast: Secondary | ICD-10-CM

## 2019-07-04 DIAGNOSIS — Z95828 Presence of other vascular implants and grafts: Secondary | ICD-10-CM

## 2019-07-04 LAB — CMP (CANCER CENTER ONLY)
ALT: 31 U/L (ref 0–44)
AST: 22 U/L (ref 15–41)
Albumin: 4 g/dL (ref 3.5–5.0)
Alkaline Phosphatase: 98 U/L (ref 38–126)
Anion gap: 8 (ref 5–15)
BUN: 24 mg/dL — ABNORMAL HIGH (ref 8–23)
CO2: 27 mmol/L (ref 22–32)
Calcium: 9.4 mg/dL (ref 8.9–10.3)
Chloride: 106 mmol/L (ref 98–111)
Creatinine: 0.89 mg/dL (ref 0.44–1.00)
GFR, Est AFR Am: >60 mL/min (ref >60)
GFR, Estimated: >60 mL/min (ref >60)
Glucose, Bld: 104 mg/dL — ABNORMAL HIGH (ref 70–99)
Potassium: 4 mmol/L (ref 3.5–5.1)
Sodium: 141 mmol/L (ref 135–145)
Total Bilirubin: 0.7 mg/dL (ref 0.3–1.2)
Total Protein: 6.9 g/dL (ref 6.5–8.1)

## 2019-07-04 LAB — CBC WITH DIFFERENTIAL (CANCER CENTER ONLY)
Abs Immature Granulocytes: 0.03 10*3/uL (ref 0.00–0.07)
Basophils Absolute: 0 10*3/uL (ref 0.0–0.1)
Basophils Relative: 1 %
Eosinophils Absolute: 0.2 10*3/uL (ref 0.0–0.5)
Eosinophils Relative: 3 %
HCT: 31.5 % — ABNORMAL LOW (ref 36.0–46.0)
Hemoglobin: 10.1 g/dL — ABNORMAL LOW (ref 12.0–15.0)
Immature Granulocytes: 1 %
Lymphocytes Relative: 41 %
Lymphs Abs: 2.3 10*3/uL (ref 0.7–4.0)
MCH: 29.3 pg (ref 26.0–34.0)
MCHC: 32.1 g/dL (ref 30.0–36.0)
MCV: 91.3 fL (ref 80.0–100.0)
Monocytes Absolute: 0.4 10*3/uL (ref 0.1–1.0)
Monocytes Relative: 7 %
Neutro Abs: 2.7 10*3/uL (ref 1.7–7.7)
Neutrophils Relative %: 47 %
Platelet Count: 213 10*3/uL (ref 150–400)
RBC: 3.45 MIL/uL — ABNORMAL LOW (ref 3.87–5.11)
RDW: 13.2 % (ref 11.5–15.5)
WBC Count: 5.6 10*3/uL (ref 4.0–10.5)
nRBC: 0 % (ref 0.0–0.2)

## 2019-07-04 MED ORDER — ACETAMINOPHEN 325 MG PO TABS
650.0000 mg | ORAL_TABLET | Freq: Once | ORAL | Status: AC
  Administered 2019-07-04: 650 mg via ORAL

## 2019-07-04 MED ORDER — ACETAMINOPHEN 325 MG PO TABS
ORAL_TABLET | ORAL | Status: AC
  Filled 2019-07-04: qty 2

## 2019-07-04 MED ORDER — ONDANSETRON HCL 8 MG PO TABS
8.0000 mg | ORAL_TABLET | Freq: Once | ORAL | Status: AC
  Administered 2019-07-04: 13:00:00 8 mg via ORAL

## 2019-07-04 MED ORDER — FAMOTIDINE IN NACL 20-0.9 MG/50ML-% IV SOLN
INTRAVENOUS | Status: AC
  Filled 2019-07-04: qty 50

## 2019-07-04 MED ORDER — FAMOTIDINE IN NACL 20-0.9 MG/50ML-% IV SOLN
20.0000 mg | Freq: Once | INTRAVENOUS | Status: AC
  Administered 2019-07-04: 20 mg via INTRAVENOUS

## 2019-07-04 MED ORDER — TRASTUZUMAB-ANNS CHEMO 150 MG IV SOLR
2.0000 mg/kg | Freq: Once | INTRAVENOUS | Status: AC
  Administered 2019-07-04: 14:00:00 189 mg via INTRAVENOUS
  Filled 2019-07-04: qty 9

## 2019-07-04 MED ORDER — DIPHENHYDRAMINE HCL 50 MG/ML IJ SOLN
25.0000 mg | Freq: Once | INTRAMUSCULAR | Status: AC
  Administered 2019-07-04: 25 mg via INTRAVENOUS

## 2019-07-04 MED ORDER — DIPHENHYDRAMINE HCL 50 MG/ML IJ SOLN
INTRAMUSCULAR | Status: AC
  Filled 2019-07-04: qty 1

## 2019-07-04 MED ORDER — SODIUM CHLORIDE 0.9% FLUSH
10.0000 mL | Freq: Once | INTRAVENOUS | Status: AC
  Administered 2019-07-04: 10 mL via INTRAVENOUS
  Filled 2019-07-04: qty 10

## 2019-07-04 MED ORDER — SODIUM CHLORIDE 0.9% FLUSH
10.0000 mL | INTRAVENOUS | Status: DC | PRN
  Administered 2019-07-04: 15:00:00 10 mL
  Filled 2019-07-04: qty 10

## 2019-07-04 MED ORDER — SODIUM CHLORIDE 0.9 % IV SOLN
Freq: Once | INTRAVENOUS | Status: AC
  Administered 2019-07-04: 13:00:00 via INTRAVENOUS
  Filled 2019-07-04: qty 250

## 2019-07-04 MED ORDER — ONDANSETRON HCL 8 MG PO TABS
ORAL_TABLET | ORAL | Status: AC
  Filled 2019-07-04: qty 1

## 2019-07-04 MED ORDER — SODIUM CHLORIDE 0.9 % IV SOLN
80.0000 mg/m2 | Freq: Once | INTRAVENOUS | Status: AC
  Administered 2019-07-04: 174 mg via INTRAVENOUS
  Filled 2019-07-04: qty 29

## 2019-07-04 MED ORDER — HEPARIN SOD (PORK) LOCK FLUSH 100 UNIT/ML IV SOLN
500.0000 [IU] | Freq: Once | INTRAVENOUS | Status: AC | PRN
  Administered 2019-07-04: 15:00:00 500 [IU]
  Filled 2019-07-04: qty 5

## 2019-07-04 NOTE — Patient Instructions (Signed)
[facility] Discharge Instructions for Patients Receiving Chemotherapy  Today you received the following chemotherapy agents: Trastuzumab, Taxol  To help prevent nausea and vomiting after your treatment, we encourage you to take your nausea medication as directed.   If you develop nausea and vomiting that is not controlled by your nausea medication, call the clinic.   BELOW ARE SYMPTOMS THAT SHOULD BE REPORTED IMMEDIATELY:  *FEVER GREATER THAN 100.5 F  *CHILLS WITH OR WITHOUT FEVER  NAUSEA AND VOMITING THAT IS NOT CONTROLLED WITH YOUR NAUSEA MEDICATION  *UNUSUAL SHORTNESS OF BREATH  *UNUSUAL BRUISING OR BLEEDING  TENDERNESS IN MOUTH AND THROAT WITH OR WITHOUT PRESENCE OF ULCERS  *URINARY PROBLEMS  *BOWEL PROBLEMS  UNUSUAL RASH Items with * indicate a potential emergency and should be followed up as soon as possible.  Feel free to call the clinic should you have any questions or concerns. The clinic phone number is [phone].  Please show the CHEMO ALERT CARD at check-in to the Emergency Department and triage nurse.   

## 2019-07-04 NOTE — Assessment & Plan Note (Signed)
04/19/2019:Screening mammogram detected right breast asymmetry. Diagnostic mammogram and US showed 2 indeterminate right breast masses, 38m at 1 o'clock, 757mat 12 o'clock, with no axillary adenopathy. 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 Stage Ia  05/30/2019: [facility], grade 3, 0.6cm, clear margins, 3 axillary lymph nodes negative.HER-2 positive (3+), ER+ (80%), PR -, Ki67 40%. Stage Ia  Treatment plan: 1.Adjuvant chemo with Taxol Herceptinplan to start 06/27/2019 2. Adjuvant radiation therapy followed by 3. Adjuvant antiestrogen therapy ------------------------------------------------------------------------------------------------------------------------------------------------------- Current treatment: Cycle 2 day 1 Taxol Herceptin Labs reviewed Echocardiogram EF 60 to 65%  Chemo toxicities:  Return to clinic weekly for chemo and every other week for follow-up with me.

## 2019-07-07 ENCOUNTER — Telehealth: Payer: Self-pay

## 2019-07-07 NOTE — Telephone Encounter (Signed)
RN spoke with patient.  Pt reports cough X 2-3 days.  Denies any fever, or body aches.  Productive cough - no discoloration with phlegm.    RN reviewed with MD.  Recommendations continue to monitor for any changes in fever and notify clinic if fever develops.  RN encouraged pt to continue with fluids, monitoring fever, and OK for OTC normal saline nose spray for congestion, and cough drops.  Pt voiced understanding.  No further needs.

## 2019-07-11 ENCOUNTER — Telehealth: Payer: Self-pay | Admitting: *Deleted

## 2019-07-11 ENCOUNTER — Inpatient Hospital Stay: Payer: Medicare Other | Attending: Hematology and Oncology

## 2019-07-11 ENCOUNTER — Other Ambulatory Visit: Payer: Self-pay

## 2019-07-11 ENCOUNTER — Inpatient Hospital Stay: Payer: Medicare Other

## 2019-07-11 VITALS — BP 154/48 | HR 52 | Temp 98.5°F | Resp 16 | Wt 213.5 lb

## 2019-07-11 DIAGNOSIS — Z5111 Encounter for antineoplastic chemotherapy: Secondary | ICD-10-CM | POA: Diagnosis present

## 2019-07-11 DIAGNOSIS — Z170 Estrogen receptor positive status [ER+]: Secondary | ICD-10-CM

## 2019-07-11 DIAGNOSIS — C50211 Malignant neoplasm of upper-inner quadrant of right female breast: Secondary | ICD-10-CM

## 2019-07-11 DIAGNOSIS — Z5112 Encounter for antineoplastic immunotherapy: Secondary | ICD-10-CM | POA: Insufficient documentation

## 2019-07-11 DIAGNOSIS — Z95828 Presence of other vascular implants and grafts: Secondary | ICD-10-CM | POA: Insufficient documentation

## 2019-07-11 LAB — CBC WITH DIFFERENTIAL (CANCER CENTER ONLY)
Abs Immature Granulocytes: 0.02 10*3/uL (ref 0.00–0.07)
Basophils Absolute: 0 10*3/uL (ref 0.0–0.1)
Basophils Relative: 0 %
Eosinophils Absolute: 0.1 10*3/uL (ref 0.0–0.5)
Eosinophils Relative: 2 %
HCT: 31.2 % — ABNORMAL LOW (ref 36.0–46.0)
Hemoglobin: 10.3 g/dL — ABNORMAL LOW (ref 12.0–15.0)
Immature Granulocytes: 0 %
Lymphocytes Relative: 50 %
Lymphs Abs: 2.4 10*3/uL (ref 0.7–4.0)
MCH: 29.5 pg (ref 26.0–34.0)
MCHC: 33 g/dL (ref 30.0–36.0)
MCV: 89.4 fL (ref 80.0–100.0)
Monocytes Absolute: 0.3 10*3/uL (ref 0.1–1.0)
Monocytes Relative: 7 %
Neutro Abs: 2 10*3/uL (ref 1.7–7.7)
Neutrophils Relative %: 41 %
Platelet Count: 250 10*3/uL (ref 150–400)
RBC: 3.49 MIL/uL — ABNORMAL LOW (ref 3.87–5.11)
RDW: 13.5 % (ref 11.5–15.5)
WBC Count: 4.8 10*3/uL (ref 4.0–10.5)
nRBC: 0 % (ref 0.0–0.2)

## 2019-07-11 LAB — CMP (CANCER CENTER ONLY)
ALT: 30 U/L (ref 0–44)
AST: 20 U/L (ref 15–41)
Albumin: 4 g/dL (ref 3.5–5.0)
Alkaline Phosphatase: 104 U/L (ref 38–126)
Anion gap: 8 (ref 5–15)
BUN: 27 mg/dL — ABNORMAL HIGH (ref 8–23)
CO2: 27 mmol/L (ref 22–32)
Calcium: 9.1 mg/dL (ref 8.9–10.3)
Chloride: 106 mmol/L (ref 98–111)
Creatinine: 0.96 mg/dL (ref 0.44–1.00)
GFR, Est AFR Am: >60 mL/min (ref >60)
GFR, Estimated: >60 mL/min (ref >60)
Glucose, Bld: 123 mg/dL — ABNORMAL HIGH (ref 70–99)
Potassium: 4.3 mmol/L (ref 3.5–5.1)
Sodium: 141 mmol/L (ref 135–145)
Total Bilirubin: 0.6 mg/dL (ref 0.3–1.2)
Total Protein: 7.2 g/dL (ref 6.5–8.1)

## 2019-07-11 MED ORDER — SODIUM CHLORIDE 0.9 % IV SOLN
Freq: Once | INTRAVENOUS | Status: AC
  Administered 2019-07-11: 14:00:00 via INTRAVENOUS
  Filled 2019-07-11: qty 250

## 2019-07-11 MED ORDER — FAMOTIDINE IN NACL 20-0.9 MG/50ML-% IV SOLN
20.0000 mg | Freq: Once | INTRAVENOUS | Status: AC
  Administered 2019-07-11: 20 mg via INTRAVENOUS

## 2019-07-11 MED ORDER — SODIUM CHLORIDE 0.9% FLUSH
10.0000 mL | Freq: Once | INTRAVENOUS | Status: AC
  Administered 2019-07-11: 10 mL
  Filled 2019-07-11: qty 10

## 2019-07-11 MED ORDER — TRASTUZUMAB-ANNS CHEMO 150 MG IV SOLR
2.0000 mg/kg | Freq: Once | INTRAVENOUS | Status: AC
  Administered 2019-07-11: 15:00:00 189 mg via INTRAVENOUS
  Filled 2019-07-11: qty 9

## 2019-07-11 MED ORDER — DIPHENHYDRAMINE HCL 50 MG/ML IJ SOLN
25.0000 mg | Freq: Once | INTRAMUSCULAR | Status: AC
  Administered 2019-07-11: 25 mg via INTRAVENOUS

## 2019-07-11 MED ORDER — ACETAMINOPHEN 325 MG PO TABS
ORAL_TABLET | ORAL | Status: AC
  Filled 2019-07-11: qty 2

## 2019-07-11 MED ORDER — FAMOTIDINE IN NACL 20-0.9 MG/50ML-% IV SOLN
INTRAVENOUS | Status: AC
  Filled 2019-07-11: qty 50

## 2019-07-11 MED ORDER — ONDANSETRON HCL 8 MG PO TABS
ORAL_TABLET | ORAL | Status: AC
  Filled 2019-07-11: qty 1

## 2019-07-11 MED ORDER — ACETAMINOPHEN 325 MG PO TABS
650.0000 mg | ORAL_TABLET | Freq: Once | ORAL | Status: AC
  Administered 2019-07-11: 650 mg via ORAL

## 2019-07-11 MED ORDER — DIPHENHYDRAMINE HCL 25 MG PO CAPS
ORAL_CAPSULE | ORAL | Status: AC
  Filled 2019-07-11: qty 1

## 2019-07-11 MED ORDER — SODIUM CHLORIDE 0.9 % IV SOLN
80.0000 mg/m2 | Freq: Once | INTRAVENOUS | Status: AC
  Administered 2019-07-11: 174 mg via INTRAVENOUS
  Filled 2019-07-11: qty 29

## 2019-07-11 MED ORDER — HEPARIN SOD (PORK) LOCK FLUSH 100 UNIT/ML IV SOLN
500.0000 [IU] | Freq: Once | INTRAVENOUS | Status: AC | PRN
  Administered 2019-07-11: 17:00:00 500 [IU]
  Filled 2019-07-11: qty 5

## 2019-07-11 MED ORDER — ONDANSETRON HCL 8 MG PO TABS
8.0000 mg | ORAL_TABLET | Freq: Once | ORAL | Status: AC
  Administered 2019-07-11: 8 mg via ORAL

## 2019-07-11 MED ORDER — SODIUM CHLORIDE 0.9% FLUSH
10.0000 mL | INTRAVENOUS | Status: DC | PRN
  Administered 2019-07-11: 10 mL
  Filled 2019-07-11: qty 10

## 2019-07-11 MED ORDER — DIPHENHYDRAMINE HCL 50 MG/ML IJ SOLN
INTRAMUSCULAR | Status: AC
  Filled 2019-07-11: qty 1

## 2019-07-11 NOTE — Patient Instructions (Signed)
[facility] Discharge Instructions for Patients Receiving Chemotherapy  Today you received the following chemotherapy agents: Trastuzumab, Taxol  To help prevent nausea and vomiting after your treatment, we encourage you to take your nausea medication as directed.   If you develop nausea and vomiting that is not controlled by your nausea medication, call the clinic.   BELOW ARE SYMPTOMS THAT SHOULD BE REPORTED IMMEDIATELY:  *FEVER GREATER THAN 100.5 F  *CHILLS WITH OR WITHOUT FEVER  NAUSEA AND VOMITING THAT IS NOT CONTROLLED WITH YOUR NAUSEA MEDICATION  *UNUSUAL SHORTNESS OF BREATH  *UNUSUAL BRUISING OR BLEEDING  TENDERNESS IN MOUTH AND THROAT WITH OR WITHOUT PRESENCE OF ULCERS  *URINARY PROBLEMS  *BOWEL PROBLEMS  UNUSUAL RASH Items with * indicate a potential emergency and should be followed up as soon as possible.  Feel free to call the clinic should you have any questions or concerns. The clinic phone number is [phone].  Please show the CHEMO ALERT CARD at check-in to the Emergency Department and triage nurse.   

## 2019-07-11 NOTE — Telephone Encounter (Signed)
Called pt to assess for needs or questions prior to chemo tx later today. No answer and unable to leave vm as vm box full.

## 2019-07-17 NOTE — Progress Notes (Signed)
Patient Care Team: Nolene Ebbs, MD as PCP - General (Internal Medicine) Mauro Kaufmann, RN as Oncology Nurse Navigator Rockwell Germany, RN as Oncology Nurse Navigator  DIAGNOSIS:    ICD-10-CM   1. Malignant neoplasm of upper-inner quadrant of right breast in female, estrogen receptor positive ([place])  C50.211    Z17.0     SUMMARY OF ONCOLOGIC HISTORY: Oncology History  Malignant neoplasm of upper-inner quadrant of right breast in female, estrogen receptor positive ([place])  04/12/2019 Cancer Staging   Staging form: Breast, AJCC 8th Edition - Clinical stage from 04/12/2019: Stage IA (cT1b, cN0, cM0, G3, ER+, PR-, HER2+) - Signed by Gardenia Phlegm, NP on 04/19/2019   04/19/2019 Initial Diagnosis   Screening mammogram detected right breast asymmetry. Diagnostic mammogram and US showed 2 indeterminate right breast masses, 2m at 1 o'clock, 741mat 12 o'clock, with no axillary adenopathy. 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    05/30/2019 Surgery   Right lumpectomy ([facility] ID[facility] with DCIS, grade 3, 0.6cm, clear margins, 3 axillary lymph nodes negative.    06/06/2019 Cancer Staging   Staging form: Breast, AJCC 8th Edition - Pathologic: Stage IA (pT1b, pN0, cM0, G3, ER+, PR-, HER2+) - Signed by GuNicholas LoseMD on 06/06/2019   06/27/2019 -  Chemotherapy   The patient had PACLitaxel (TAXOL) 174 mg in sodium chloride 0.9 % 250 mL chemo infusion (</= 8048m2), 80 mg/m2 = 174 mg, Intravenous,  Once, 1 of 3 cycles Administration: 174 mg (06/27/2019), 174 mg (07/04/2019), 174 mg (07/11/2019) trastuzumab-anns (KANJINTI) 399 mg in sodium chloride 0.9 % 250 mL chemo infusion, 4 mg/kg = 399 mg (100 % of original dose 4 mg/kg), Intravenous,  Once, 1 of 16 cycles Dose modification: 4 mg/kg (original dose 4 mg/kg, Cycle 1, Reason: Other (see comments), Comment: Biosimilar Conversion; [facility] mg/[facility] (original dose 2 mg/kg, Cycle 3, Reason: Other (see  comments), Comment: switch to maintenance q3 weeks), 600 mg (original dose 2 mg/kg, Cycle 3, Reason: Other (see comments), Comment: maint q3 weeks) Administration: 399 mg (06/27/2019), 189 mg (07/04/2019), 189 mg (07/11/2019)  for chemotherapy treatment.      CHIEF COMPLIANT: Cycle 4 Taxol Herceptin  INTERVAL HISTORY: Kathy Reese a [age]o. with above-mentioned history of right breast cancerwho underwent a lumpectomy. She is currently on adjuvant chemotherapy with weekly Taxol and Herceptin.She presents to the clinic todayfor cycle 4.  Her biggest complaint is diarrhea.  She has not been taking Imodium.  She takes ginger ale for the mild nausea.  She denies neuropathy.  REVIEW OF SYSTEMS:   Constitutional: Denies fevers, chills or abnormal weight loss Eyes: Denies blurriness of vision Ears, nose, mouth, throat, and face: Denies mucositis or sore throat Respiratory: Denies cough, dyspnea or wheezes Cardiovascular: Denies palpitation, chest discomfort Gastrointestinal: Diarrhea Skin: Denies abnormal skin rashes Lymphatics: Denies new lymphadenopathy or easy bruising Neurological: Denies numbness, tingling or new weaknesses Behavioral/Psych: Mood is stable, no new changes  Extremities: No lower extremity edema Breast: denies any pain or lumps or nodules in either breasts All other systems were reviewed with the patient and are negative.  I have reviewed the past medical history, past surgical history, social history and family history with the patient and they are unchanged from previous note.  ALLERGIES:  has No Known Allergies.  MEDICATIONS:  Current Outpatient Medications  Medication Sig Dispense Refill  . aspirin EC 81 MG tablet Take 81 mg by mouth  daily.    . atorvastatin (LIPITOR) 40 MG tablet Take 40 mg by mouth daily at 6 PM.     . Calcium Carb-Cholecalciferol (CALCIUM 600+D3 PO) Take 1 tablet by mouth 2 (two) times a day.    . cholecalciferol (VITAMIN D3) 25 MCG  (1000 UT) tablet Take 1,000 Units by mouth daily.    . Cinnamon 500 MG capsule Take 500 mg by mouth daily.     . citalopram (CELEXA) 10 MG tablet Take 10 mg by mouth daily.    . insulin detemir (LEVEMIR) 100 UNIT/ML injection Inject 12-15 Units into the skin See admin instructions. Inject 15 units subcutaneously in the morning & 12 units subcutaneously at night.    . lidocaine-prilocaine (EMLA) cream Apply to affected area once 30 g 3  . LORazepam (ATIVAN) 0.5 MG tablet Take 1 tablet (0.5 mg total) by mouth at bedtime as needed for sleep. 30 tablet 0  . losartan-hydrochlorothiazide (HYZAAR) 100-25 MG tablet Take 1 tablet by mouth daily.  4  . meloxicam (MOBIC) 15 MG tablet Take 15 mg by mouth daily as needed for pain.     . metFORMIN (GLUCOPHAGE) 500 MG tablet Take 500 mg by mouth every morning.     . metoprolol (TOPROL-XL) 50 MG 24 hr tablet Take 50 mg by mouth daily.      . ondansetron (ZOFRAN) 8 MG tablet Take 1 tablet (8 mg total) by mouth 2 (two) times daily as needed (Nausea or vomiting). 30 tablet 1  . oxyCODONE (OXY IR/ROXICODONE) 5 MG immediate release tablet Take 1 tablet (5 mg total) by mouth every 6 (six) hours as needed for severe pain. (Patient not taking: Reported on 06/14/2019) 20 tablet 0  . prochlorperazine (COMPAZINE) 10 MG tablet Take 1 tablet (10 mg total) by mouth every 6 (six) hours as needed (Nausea or vomiting). 30 tablet 1   No current facility-administered medications for this visit.     PHYSICAL EXAMINATION: ECOG PERFORMANCE STATUS: 1 - Symptomatic but completely ambulatory  Vitals:   07/18/19 1048  BP: (!) 163/65  Pulse: (!) 56  Resp: 18  Temp: 98.2 F (36.8 C)  SpO2: 100%   Filed Weights   07/18/19 1048  Weight: 216 lb 9.6 oz (98.2 kg)    GENERAL: alert, no distress and comfortable SKIN: skin color, texture, turgor are normal, no rashes or significant lesions EYES: normal, Conjunctiva are pink and non-injected, sclera clear OROPHARYNX: no exudate, no  erythema and lips, buccal mucosa, and tongue normal  NECK: supple, thyroid normal size, non-tender, without nodularity LYMPH: no palpable lymphadenopathy in the cervical, axillary or inguinal LUNGS: clear to auscultation and percussion with normal breathing effort HEART: regular rate & rhythm and no murmurs and no lower extremity edema ABDOMEN: abdomen soft, non-tender and normal bowel sounds MUSCULOSKELETAL: no cyanosis of digits and no clubbing  NEURO: alert & oriented x 3 with fluent speech, no focal motor/sensory deficits EXTREMITIES: No lower extremity edema  LABORATORY DATA:  I have reviewed the data as listed CMP Latest Ref Rng & Units 07/11/2019 07/04/2019 06/27/2019  Glucose 70 - 99 mg/dL 123(H) 104(H) 132(H)  BUN 8 - 23 mg/dL 27(H) 24(H) 24(H)  Creatinine 0.44 - 1.00 mg/dL 0.96 0.89 0.85  Sodium 135 - 145 mmol/L 141 141 141  Potassium 3.5 - 5.1 mmol/L 4.3 4.0 3.9  Chloride 98 - 111 mmol/L 106 106 105  CO2 22 - 32 mmol/L 27 27 26   Calcium 8.9 - 10.3 mg/dL 9.1 9.4 9.6  Total  Protein 6.5 - 8.1 g/dL 7.2 6.9 7.0  Total Bilirubin 0.3 - 1.2 mg/dL 0.6 0.7 0.9  Alkaline Phos 38 - 126 U/L 104 98 118  AST 15 - 41 U/L 20 22 15   ALT 0 - 44 U/L 30 31 15     Lab Results  Component Value Date   WBC 4.6 07/18/2019   HGB 10.3 (L) 07/18/2019   HCT 31.0 (L) 07/18/2019   MCV 91.4 07/18/2019   PLT 309 07/18/2019   NEUTROABS 1.8 07/18/2019    ASSESSMENT & PLAN:  Malignant neoplasm of upper-inner quadrant of right breast in female, estrogen receptor positive ([place]) 04/19/2019:Screening mammogram detected right breast asymmetry. Diagnostic mammogram and US showed 2 indeterminate right breast masses, 28m at 1 o'clock, 777mat 12 o'clock, with no axillary adenopathy. 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 Stage Ia  05/30/2019: [facility], grade 3, 0.6cm, clear margins, 3 axillary lymph nodes negative.HER-2  positive (3+), ER+ (80%), PR -, Ki67 40%. Stage Ia  Treatment plan: 1.Adjuvant chemo with Taxol Herceptinplan to start 06/27/2019 2. Adjuvant radiation therapy followed by 3. Adjuvant antiestrogen therapy ------------------------------------------------------------------------------------------------------------------------------------------------------- Current treatment: Cycle 4 day 1 Taxol Herceptin Labs reviewed Echocardiogram EF 60 to 65%  Chemo toxicities: Chemotherapy-induced anemia: Hemoglobin is 10.1 and will be monitored Herceptin-induced diarrhea: Instructed her on how to take Imodium. Monitoring closely for neuropathy and other toxicities.  Return to clinic weekly for chemo and every other week for follow-up with me.   No orders of the defined types were placed in this encounter.  The patient has a good understanding of the overall plan. she agrees with it. she will call with any problems that may develop before the next visit here.  GuNicholas LoseMD 07/18/2019  I,Julious Okaorshimer am acting as scribe for Dr. ViNicholas Lose I have reviewed the above documentation for accuracy and completeness, and I agree with the above.

## 2019-07-18 ENCOUNTER — Inpatient Hospital Stay: Payer: Medicare Other

## 2019-07-18 ENCOUNTER — Inpatient Hospital Stay (HOSPITAL_BASED_OUTPATIENT_CLINIC_OR_DEPARTMENT_OTHER): Payer: Medicare Other | Admitting: Hematology and Oncology

## 2019-07-18 ENCOUNTER — Other Ambulatory Visit: Payer: Self-pay

## 2019-07-18 DIAGNOSIS — Z170 Estrogen receptor positive status [ER+]: Secondary | ICD-10-CM

## 2019-07-18 DIAGNOSIS — C50211 Malignant neoplasm of upper-inner quadrant of right female breast: Secondary | ICD-10-CM

## 2019-07-18 DIAGNOSIS — R197 Diarrhea, unspecified: Secondary | ICD-10-CM

## 2019-07-18 DIAGNOSIS — Z5112 Encounter for antineoplastic immunotherapy: Secondary | ICD-10-CM | POA: Diagnosis not present

## 2019-07-18 DIAGNOSIS — Z95828 Presence of other vascular implants and grafts: Secondary | ICD-10-CM

## 2019-07-18 LAB — CBC WITH DIFFERENTIAL (CANCER CENTER ONLY)
Abs Immature Granulocytes: 0.02 10*3/uL (ref 0.00–0.07)
Basophils Absolute: 0.1 10*3/uL (ref 0.0–0.1)
Basophils Relative: 1 %
Eosinophils Absolute: 0.1 10*3/uL (ref 0.0–0.5)
Eosinophils Relative: 2 %
HCT: 31 % — ABNORMAL LOW (ref 36.0–46.0)
Hemoglobin: 10.3 g/dL — ABNORMAL LOW (ref 12.0–15.0)
Immature Granulocytes: 0 %
Lymphocytes Relative: 51 %
Lymphs Abs: 2.4 10*3/uL (ref 0.7–4.0)
MCH: 30.4 pg (ref 26.0–34.0)
MCHC: 33.2 g/dL (ref 30.0–36.0)
MCV: 91.4 fL (ref 80.0–100.0)
Monocytes Absolute: 0.3 10*3/uL (ref 0.1–1.0)
Monocytes Relative: 7 %
Neutro Abs: 1.8 10*3/uL (ref 1.7–7.7)
Neutrophils Relative %: 39 %
Platelet Count: 309 10*3/uL (ref 150–400)
RBC: 3.39 MIL/uL — ABNORMAL LOW (ref 3.87–5.11)
RDW: 14 % (ref 11.5–15.5)
WBC Count: 4.6 10*3/uL (ref 4.0–10.5)
nRBC: 0 % (ref 0.0–0.2)

## 2019-07-18 LAB — CMP (CANCER CENTER ONLY)
ALT: 57 U/L — ABNORMAL HIGH (ref 0–44)
AST: 33 U/L (ref 15–41)
Albumin: 3.9 g/dL (ref 3.5–5.0)
Alkaline Phosphatase: 109 U/L (ref 38–126)
Anion gap: 8 (ref 5–15)
BUN: 27 mg/dL — ABNORMAL HIGH (ref 8–23)
CO2: 25 mmol/L (ref 22–32)
Calcium: 8.6 mg/dL — ABNORMAL LOW (ref 8.9–10.3)
Chloride: 109 mmol/L (ref 98–111)
Creatinine: 0.96 mg/dL (ref 0.44–1.00)
GFR, Est AFR Am: >60 mL/min (ref >60)
GFR, Estimated: >60 mL/min (ref >60)
Glucose, Bld: 117 mg/dL — ABNORMAL HIGH (ref 70–99)
Potassium: 4.1 mmol/L (ref 3.5–5.1)
Sodium: 142 mmol/L (ref 135–145)
Total Bilirubin: 0.6 mg/dL (ref 0.3–1.2)
Total Protein: 7 g/dL (ref 6.5–8.1)

## 2019-07-18 MED ORDER — DIPHENHYDRAMINE HCL 50 MG/ML IJ SOLN
25.0000 mg | Freq: Once | INTRAMUSCULAR | Status: AC
  Administered 2019-07-18: 25 mg via INTRAVENOUS

## 2019-07-18 MED ORDER — SODIUM CHLORIDE 0.9 % IV SOLN
Freq: Once | INTRAVENOUS | Status: AC
  Administered 2019-07-18: 11:00:00 via INTRAVENOUS
  Filled 2019-07-18: qty 250

## 2019-07-18 MED ORDER — ONDANSETRON HCL 8 MG PO TABS
8.0000 mg | ORAL_TABLET | Freq: Once | ORAL | Status: AC
  Administered 2019-07-18: 8 mg via ORAL

## 2019-07-18 MED ORDER — ACETAMINOPHEN 325 MG PO TABS
650.0000 mg | ORAL_TABLET | Freq: Once | ORAL | Status: AC
  Administered 2019-07-18: 650 mg via ORAL

## 2019-07-18 MED ORDER — ONDANSETRON HCL 8 MG PO TABS
ORAL_TABLET | ORAL | Status: AC
  Filled 2019-07-18: qty 1

## 2019-07-18 MED ORDER — LOPERAMIDE HCL 2 MG PO CAPS
ORAL_CAPSULE | ORAL | Status: AC
  Filled 2019-07-18: qty 1

## 2019-07-18 MED ORDER — SODIUM CHLORIDE 0.9% FLUSH
10.0000 mL | INTRAVENOUS | Status: DC | PRN
  Administered 2019-07-18: 10 mL
  Filled 2019-07-18: qty 10

## 2019-07-18 MED ORDER — TRASTUZUMAB-ANNS CHEMO 150 MG IV SOLR
2.0000 mg/kg | Freq: Once | INTRAVENOUS | Status: AC
  Administered 2019-07-18: 189 mg via INTRAVENOUS
  Filled 2019-07-18: qty 9

## 2019-07-18 MED ORDER — ACETAMINOPHEN 325 MG PO TABS
ORAL_TABLET | ORAL | Status: AC
  Filled 2019-07-18: qty 2

## 2019-07-18 MED ORDER — HEPARIN SOD (PORK) LOCK FLUSH 100 UNIT/ML IV SOLN
500.0000 [IU] | Freq: Once | INTRAVENOUS | Status: AC | PRN
  Administered 2019-07-18: 500 [IU]
  Filled 2019-07-18: qty 5

## 2019-07-18 MED ORDER — LOPERAMIDE HCL 2 MG PO CAPS
2.0000 mg | ORAL_CAPSULE | Freq: Once | ORAL | Status: AC
  Administered 2019-07-18: 2 mg via ORAL

## 2019-07-18 MED ORDER — SODIUM CHLORIDE 0.9% FLUSH
10.0000 mL | Freq: Once | INTRAVENOUS | Status: AC
  Administered 2019-07-18: 10 mL
  Filled 2019-07-18: qty 10

## 2019-07-18 MED ORDER — SODIUM CHLORIDE 0.9 % IV SOLN
80.0000 mg/m2 | Freq: Once | INTRAVENOUS | Status: AC
  Administered 2019-07-18: 174 mg via INTRAVENOUS
  Filled 2019-07-18: qty 29

## 2019-07-18 MED ORDER — FAMOTIDINE IN NACL 20-0.9 MG/50ML-% IV SOLN
INTRAVENOUS | Status: AC
  Filled 2019-07-18: qty 50

## 2019-07-18 MED ORDER — FAMOTIDINE IN NACL 20-0.9 MG/50ML-% IV SOLN
20.0000 mg | Freq: Once | INTRAVENOUS | Status: AC
  Administered 2019-07-18: 20 mg via INTRAVENOUS

## 2019-07-18 MED ORDER — DIPHENHYDRAMINE HCL 50 MG/ML IJ SOLN
INTRAMUSCULAR | Status: AC
  Filled 2019-07-18: qty 1

## 2019-07-18 NOTE — Patient Instructions (Signed)
[facility] Discharge Instructions for Patients Receiving Chemotherapy  Today you received the following chemotherapy agents: Trastuzumab, paclitaxel  To help prevent nausea and vomiting after your treatment, we encourage you to take your nausea medication as directed.   If you develop nausea and vomiting that is not controlled by your nausea medication, call the clinic.   BELOW ARE SYMPTOMS THAT SHOULD BE REPORTED IMMEDIATELY:  *FEVER GREATER THAN 100.5 F  *CHILLS WITH OR WITHOUT FEVER  NAUSEA AND VOMITING THAT IS NOT CONTROLLED WITH YOUR NAUSEA MEDICATION  *UNUSUAL SHORTNESS OF BREATH  *UNUSUAL BRUISING OR BLEEDING  TENDERNESS IN MOUTH AND THROAT WITH OR WITHOUT PRESENCE OF ULCERS  *URINARY PROBLEMS  *BOWEL PROBLEMS  UNUSUAL RASH Items with * indicate a potential emergency and should be followed up as soon as possible.  Feel free to call the clinic should you have any questions or concerns. The clinic phone number is [phone].  Please show the [place] at check-in to the Emergency Department and triage nurse.

## 2019-07-18 NOTE — Assessment & Plan Note (Signed)
04/19/2019:Screening mammogram detected right breast asymmetry. Diagnostic mammogram and US showed 2 indeterminate right breast masses, 97m at 1 o'clock, 730mat 12 o'clock, with no axillary adenopathy. 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 Stage Ia  05/30/2019: [facility], grade 3, 0.6cm, clear margins, 3 axillary lymph nodes negative.HER-2 positive (3+), ER+ (80%), PR -, Ki67 40%. Stage Ia  Treatment plan: 1.Adjuvant chemo with Taxol Herceptinplan to start 06/27/2019 2. Adjuvant radiation therapy followed by 3. Adjuvant antiestrogen therapy ------------------------------------------------------------------------------------------------------------------------------------------------------- Current treatment: Cycle 4 day 1 Taxol Herceptin Labs reviewed Echocardiogram EF 60 to 65%  Chemo toxicities: Chemotherapy-induced anemia: Hemoglobin is 10.1 and will be monitored  Return to clinic weekly for chemo and every other week for follow-up with me.

## 2019-07-25 ENCOUNTER — Inpatient Hospital Stay: Payer: Medicare Other

## 2019-07-25 ENCOUNTER — Other Ambulatory Visit: Payer: Self-pay

## 2019-07-25 VITALS — BP 159/66 | HR 63 | Resp 18 | Wt 212.8 lb

## 2019-07-25 DIAGNOSIS — C50211 Malignant neoplasm of upper-inner quadrant of right female breast: Secondary | ICD-10-CM

## 2019-07-25 DIAGNOSIS — Z170 Estrogen receptor positive status [ER+]: Secondary | ICD-10-CM

## 2019-07-25 DIAGNOSIS — Z5112 Encounter for antineoplastic immunotherapy: Secondary | ICD-10-CM | POA: Diagnosis not present

## 2019-07-25 LAB — CBC WITH DIFFERENTIAL (CANCER CENTER ONLY)
Abs Immature Granulocytes: 0.02 10*3/uL (ref 0.00–0.07)
Basophils Absolute: 0 10*3/uL (ref 0.0–0.1)
Basophils Relative: 1 %
Eosinophils Absolute: 0.1 10*3/uL (ref 0.0–0.5)
Eosinophils Relative: 2 %
HCT: 29.4 % — ABNORMAL LOW (ref 36.0–46.0)
Hemoglobin: 9.7 g/dL — ABNORMAL LOW (ref 12.0–15.0)
Immature Granulocytes: 0 %
Lymphocytes Relative: 45 %
Lymphs Abs: 2.1 10*3/uL (ref 0.7–4.0)
MCH: 29.5 pg (ref 26.0–34.0)
MCHC: 33 g/dL (ref 30.0–36.0)
MCV: 89.4 fL (ref 80.0–100.0)
Monocytes Absolute: 0.4 10*3/uL (ref 0.1–1.0)
Monocytes Relative: 9 %
Neutro Abs: 2 10*3/uL (ref 1.7–7.7)
Neutrophils Relative %: 43 %
Platelet Count: 248 10*3/uL (ref 150–400)
RBC: 3.29 MIL/uL — ABNORMAL LOW (ref 3.87–5.11)
RDW: 14.2 % (ref 11.5–15.5)
WBC Count: 4.6 10*3/uL (ref 4.0–10.5)
nRBC: 0 % (ref 0.0–0.2)

## 2019-07-25 LAB — CMP (CANCER CENTER ONLY)
ALT: 53 U/L — ABNORMAL HIGH (ref 0–44)
AST: 35 U/L (ref 15–41)
Albumin: 3.8 g/dL (ref 3.5–5.0)
Alkaline Phosphatase: 104 U/L (ref 38–126)
Anion gap: 12 (ref 5–15)
BUN: 25 mg/dL — ABNORMAL HIGH (ref 8–23)
CO2: 24 mmol/L (ref 22–32)
Calcium: 9.6 mg/dL (ref 8.9–10.3)
Chloride: 105 mmol/L (ref 98–111)
Creatinine: 0.86 mg/dL (ref 0.44–1.00)
GFR, Est AFR Am: >60 mL/min (ref >60)
GFR, Estimated: >60 mL/min (ref >60)
Glucose, Bld: 105 mg/dL — ABNORMAL HIGH (ref 70–99)
Potassium: 4.5 mmol/L (ref 3.5–5.1)
Sodium: 141 mmol/L (ref 135–145)
Total Bilirubin: 0.7 mg/dL (ref 0.3–1.2)
Total Protein: 6.8 g/dL (ref 6.5–8.1)

## 2019-07-25 MED ORDER — ACETAMINOPHEN 325 MG PO TABS
ORAL_TABLET | ORAL | Status: AC
  Filled 2019-07-25: qty 2

## 2019-07-25 MED ORDER — SODIUM CHLORIDE 0.9% FLUSH
10.0000 mL | INTRAVENOUS | Status: DC | PRN
  Administered 2019-07-25: 10 mL
  Filled 2019-07-25: qty 10

## 2019-07-25 MED ORDER — SODIUM CHLORIDE 0.9 % IV SOLN
Freq: Once | INTRAVENOUS | Status: AC
  Administered 2019-07-25: 13:00:00 via INTRAVENOUS
  Filled 2019-07-25: qty 250

## 2019-07-25 MED ORDER — ACETAMINOPHEN 325 MG PO TABS
650.0000 mg | ORAL_TABLET | Freq: Once | ORAL | Status: AC
  Administered 2019-07-25: 14:00:00 650 mg via ORAL

## 2019-07-25 MED ORDER — HEPARIN SOD (PORK) LOCK FLUSH 100 UNIT/ML IV SOLN
500.0000 [IU] | Freq: Once | INTRAVENOUS | Status: AC | PRN
  Administered 2019-07-25: 500 [IU]
  Filled 2019-07-25: qty 5

## 2019-07-25 MED ORDER — SODIUM CHLORIDE 0.9 % IV SOLN
80.0000 mg/m2 | Freq: Once | INTRAVENOUS | Status: AC
  Administered 2019-07-25: 174 mg via INTRAVENOUS
  Filled 2019-07-25: qty 29

## 2019-07-25 MED ORDER — ONDANSETRON HCL 8 MG PO TABS
ORAL_TABLET | ORAL | Status: AC
  Filled 2019-07-25: qty 1

## 2019-07-25 MED ORDER — DIPHENHYDRAMINE HCL 50 MG/ML IJ SOLN
INTRAMUSCULAR | Status: AC
  Filled 2019-07-25: qty 1

## 2019-07-25 MED ORDER — TRASTUZUMAB-ANNS CHEMO 150 MG IV SOLR
2.0000 mg/kg | Freq: Once | INTRAVENOUS | Status: AC
  Administered 2019-07-25: 189 mg via INTRAVENOUS
  Filled 2019-07-25: qty 9

## 2019-07-25 MED ORDER — FAMOTIDINE IN NACL 20-0.9 MG/50ML-% IV SOLN
INTRAVENOUS | Status: AC
  Filled 2019-07-25: qty 50

## 2019-07-25 MED ORDER — ONDANSETRON HCL 8 MG PO TABS
8.0000 mg | ORAL_TABLET | Freq: Once | ORAL | Status: AC
  Administered 2019-07-25: 8 mg via ORAL

## 2019-07-25 MED ORDER — DIPHENHYDRAMINE HCL 50 MG/ML IJ SOLN
25.0000 mg | Freq: Once | INTRAMUSCULAR | Status: AC
  Administered 2019-07-25: 25 mg via INTRAVENOUS

## 2019-07-25 MED ORDER — FAMOTIDINE IN NACL 20-0.9 MG/50ML-% IV SOLN
20.0000 mg | Freq: Once | INTRAVENOUS | Status: AC
  Administered 2019-07-25: 14:00:00 20 mg via INTRAVENOUS

## 2019-07-25 NOTE — Patient Instructions (Signed)
[facility] Discharge Instructions for Patients Receiving Chemotherapy  Today you received the following chemotherapy agents Trastuzumab and Taxol  To help prevent nausea and vomiting after your treatment, we encourage you to take your nausea medication as directed.    If you develop nausea and vomiting that is not controlled by your nausea medication, call the clinic.   BELOW ARE SYMPTOMS THAT SHOULD BE REPORTED IMMEDIATELY:  *FEVER GREATER THAN 100.5 F  *CHILLS WITH OR WITHOUT FEVER  NAUSEA AND VOMITING THAT IS NOT CONTROLLED WITH YOUR NAUSEA MEDICATION  *UNUSUAL SHORTNESS OF BREATH  *UNUSUAL BRUISING OR BLEEDING  TENDERNESS IN MOUTH AND THROAT WITH OR WITHOUT PRESENCE OF ULCERS  *URINARY PROBLEMS  *BOWEL PROBLEMS  UNUSUAL RASH Items with * indicate a potential emergency and should be followed up as soon as possible.  Feel free to call the clinic should you have any questions or concerns. The clinic phone number is [phone].  Please show the CHEMO ALERT CARD at check-in to the Emergency Department and triage nurse.   

## 2019-07-31 NOTE — Progress Notes (Signed)
Patient Care Team: Nolene Ebbs, MD as PCP - General (Internal Medicine) Mauro Kaufmann, RN as Oncology Nurse Navigator Rockwell Germany, RN as Oncology Nurse Navigator  DIAGNOSIS:    ICD-10-CM   1. Malignant neoplasm of upper-inner quadrant of right breast in female, estrogen receptor positive ([place])  C50.211    Z17.0     SUMMARY OF ONCOLOGIC HISTORY: Oncology History  Malignant neoplasm of upper-inner quadrant of right breast in female, estrogen receptor positive ([place])  04/12/2019 Cancer Staging   Staging form: Breast, AJCC 8th Edition - Clinical stage from 04/12/2019: Stage IA (cT1b, cN0, cM0, G3, ER+, PR-, HER2+) - Signed by Gardenia Phlegm, NP on 04/19/2019   04/19/2019 Initial Diagnosis   Screening mammogram detected right breast asymmetry. Diagnostic mammogram and US showed 2 indeterminate right breast masses, 19m at 1 o'clock, 773mat 12 o'clock, with no axillary adenopathy. 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    05/30/2019 Surgery   Right lumpectomy ([facility] ID[facility] with DCIS, grade 3, 0.6cm, clear margins, 3 axillary lymph nodes negative.    06/06/2019 Cancer Staging   Staging form: Breast, AJCC 8th Edition - Pathologic: Stage IA (pT1b, pN0, cM0, G3, ER+, PR-, HER2+) - Signed by GuNicholas LoseMD on 06/06/2019   06/27/2019 -  Chemotherapy   The patient had PACLitaxel (TAXOL) 174 mg in sodium chloride 0.9 % 250 mL chemo infusion (</= 8086m2), 80 mg/m2 = 174 mg, Intravenous,  Once, 2 of 3 cycles Administration: 174 mg (06/27/2019), 174 mg (07/04/2019), 174 mg (07/25/2019), 174 mg (07/11/2019), 174 mg (07/18/2019) trastuzumab-anns (KANJINTI) 399 mg in sodium chloride 0.9 % 250 mL chemo infusion, 4 mg/kg = 399 mg (100 % of original dose 4 mg/kg), Intravenous,  Once, 2 of 16 cycles Dose modification: 4 mg/kg (original dose 4 mg/kg, Cycle 1, Reason: Other (see comments), Comment: Biosimilar Conversion; [facility] (original dose 2  mg/kg, Cycle 3, Reason: Other (see comments), Comment: switch to maintenance q3 weeks), 600 mg (original dose 2 mg/kg, Cycle 3, Reason: Other (see comments), Comment: maint q3 weeks) Administration: 399 mg (06/27/2019), 189 mg (07/04/2019), 189 mg (07/11/2019), 189 mg (07/18/2019), 189 mg (07/25/2019)  for chemotherapy treatment.      CHIEF COMPLIANT: Cycle6Taxol Herceptin  INTERVAL HISTORY: Kathy Reese a 67 97o. with above-mentioned history of right breast cancerwho underwent a lumpectomy. She is currently on adjuvant chemotherapy with weekly Taxol and Herceptin.She presents to the clinic todayforcycle 6.   REVIEW OF SYSTEMS:   Constitutional: Denies fevers, chills or abnormal weight loss Eyes: Denies blurriness of vision Ears, nose, mouth, throat, and face: Denies mucositis or sore throat Respiratory: Denies cough, dyspnea or wheezes Cardiovascular: Denies palpitation, chest discomfort Gastrointestinal: Denies nausea, heartburn or change in bowel habits Skin: Denies abnormal skin rashes Lymphatics: Denies new lymphadenopathy or easy bruising Neurological: Denies numbness, tingling or new weaknesses Behavioral/Psych: Mood is stable, no new changes  Extremities: No lower extremity edema Breast: denies any pain or lumps or nodules in either breasts All other systems were reviewed with the patient and are negative.  I have reviewed the past medical history, past surgical history, social history and family history with the patient and they are unchanged from previous note.  ALLERGIES:  has No Known Allergies.  MEDICATIONS:  Current Outpatient Medications  Medication Sig Dispense Refill  . aspirin EC 81 MG tablet Take 81 mg by mouth daily.    . aMarland Kitchenorvastatin (LIPITOR) 40 MG  tablet Take 40 mg by mouth daily at 6 PM.     . Calcium Carb-Cholecalciferol (CALCIUM 600+D3 PO) Take 1 tablet by mouth 2 (two) times a day.    . cholecalciferol (VITAMIN D3) 25 MCG (1000 UT) tablet  Take 1,000 Units by mouth daily.    . Cinnamon 500 MG capsule Take 500 mg by mouth daily.     . citalopram (CELEXA) 10 MG tablet Take 10 mg by mouth daily.    . insulin detemir (LEVEMIR) 100 UNIT/ML injection Inject 12-15 Units into the skin See admin instructions. Inject 15 units subcutaneously in the morning & 12 units subcutaneously at night.    . lidocaine-prilocaine (EMLA) cream Apply to affected area once 30 g 3  . LORazepam (ATIVAN) 0.5 MG tablet Take 1 tablet (0.5 mg total) by mouth at bedtime as needed for sleep. 30 tablet 0  . losartan-hydrochlorothiazide (HYZAAR) 100-25 MG tablet Take 1 tablet by mouth daily.  4  . meloxicam (MOBIC) 15 MG tablet Take 15 mg by mouth daily as needed for pain.     . metFORMIN (GLUCOPHAGE) 500 MG tablet Take 500 mg by mouth every morning.     . metoprolol (TOPROL-XL) 50 MG 24 hr tablet Take 50 mg by mouth daily.      . ondansetron (ZOFRAN) 8 MG tablet Take 1 tablet (8 mg total) by mouth 2 (two) times daily as needed (Nausea or vomiting). 30 tablet 1  . oxyCODONE (OXY IR/ROXICODONE) 5 MG immediate release tablet Take 1 tablet (5 mg total) by mouth every 6 (six) hours as needed for severe pain. (Patient not taking: Reported on 06/14/2019) 20 tablet 0  . prochlorperazine (COMPAZINE) 10 MG tablet Take 1 tablet (10 mg total) by mouth every 6 (six) hours as needed (Nausea or vomiting). 30 tablet 1   No current facility-administered medications for this visit.     PHYSICAL EXAMINATION: ECOG PERFORMANCE STATUS: 1 - Symptomatic but completely ambulatory  There were no vitals filed for this visit. There were no vitals filed for this visit.  GENERAL: alert, no distress and comfortable SKIN: skin color, texture, turgor are normal, no rashes or significant lesions EYES: normal, Conjunctiva are pink and non-injected, sclera clear OROPHARYNX: no exudate, no erythema and lips, buccal mucosa, and tongue normal  NECK: supple, thyroid normal size, non-tender, without  nodularity LYMPH: no palpable lymphadenopathy in the cervical, axillary or inguinal LUNGS: clear to auscultation and percussion with normal breathing effort HEART: regular rate & rhythm and no murmurs and no lower extremity edema ABDOMEN: abdomen soft, non-tender and normal bowel sounds MUSCULOSKELETAL: no cyanosis of digits and no clubbing  NEURO: alert & oriented x 3 with fluent speech, no focal motor/sensory deficits EXTREMITIES: No lower extremity edema  LABORATORY DATA:  I have reviewed the data as listed CMP Latest Ref Rng & Units 07/25/2019 07/18/2019 07/11/2019  Glucose 70 - 99 mg/dL 105(H) 117(H) 123(H)  BUN 8 - 23 mg/dL 25(H) 27(H) 27(H)  Creatinine 0.44 - 1.00 mg/dL 0.86 0.96 0.96  Sodium 135 - 145 mmol/L 141 142 141  Potassium 3.5 - 5.1 mmol/L 4.5 4.1 4.3  Chloride 98 - 111 mmol/L 105 109 106  CO2 22 - 32 mmol/L _0 Calcium 8.9 - 10.3 mg/dL 9.6 8.6(L) 9.1  Total Protein 6.5 - 8.1 g/dL 6.8 7.0 7.2  Total Bilirubin 0.3 - 1.2 mg/dL 0.7 0.6 0.6  Alkaline Phos 38 - 126 U/L 104 109 104  AST 15 - 41 U/L 35 33  20  ALT 0 - 44 U/L 53(H) 57(H) 30    Lab Results  Component Value Date   WBC 4.8 08/01/2019   HGB 9.8 (L) 08/01/2019   HCT 30.1 (L) 08/01/2019   MCV 90.1 08/01/2019   PLT 238 08/01/2019   NEUTROABS 2.1 08/01/2019    ASSESSMENT & PLAN:  Malignant neoplasm of upper-inner quadrant of right breast in female, estrogen receptor positive ([place]) 04/19/2019:Screening mammogram detected right breast asymmetry. Diagnostic mammogram and US showed 2 indeterminate right breast masses, 52m at 1 o'clock, 768mat 12 o'clock, with no axillary adenopathy. 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 Stage Ia  05/30/2019: [facility], grade 3, 0.6cm, clear margins, 3 axillary lymph nodes negative.HER-2 positive (3+), ER+ (80%), PR -, Ki67 40%. Stage Ia  Treatment plan: 1.Adjuvant chemo with  Taxol Herceptinplan to start 06/27/2019 2. Adjuvant radiation therapy followed by 3. Adjuvant antiestrogen therapy ------------------------------------------------------------------------------------------------------------------------------------------------------- Current treatment: Cycle6day 1 Taxol Herceptin Labs reviewed Echocardiogram EF 60 to 65%  Chemo toxicities: Chemotherapy-induced anemia: Hemoglobin is 9.8 and will be monitored Herceptin-induced diarrhea: Instructed her on how to take Imodium. Monitoring closely for neuropathy and other toxicities. Encouraged her to exercise more Chemo-induced peripheral neuropathy: Grade 1: We will continue to monitor and keep the dosage the same I discussed with her that I anticipate that she will have a dose reduction when she comes back to see me in 2 weeks.  Return to clinicweekly for chemo and every other week for follow-up with me.   No orders of the defined types were placed in this encounter.  The patient has a good understanding of the overall plan. she agrees with it. she will call with any problems that may develop before the next visit here.  GuNicholas LoseMD 08/01/2019  I,Julious Okaorshimer am acting as scribe for Dr. ViNicholas Lose I have reviewed the above documentation for accuracy and completeness, and I agree with the above.'

## 2019-08-01 ENCOUNTER — Inpatient Hospital Stay: Payer: Medicare Other

## 2019-08-01 ENCOUNTER — Inpatient Hospital Stay (HOSPITAL_BASED_OUTPATIENT_CLINIC_OR_DEPARTMENT_OTHER): Payer: Medicare Other | Admitting: Hematology and Oncology

## 2019-08-01 ENCOUNTER — Other Ambulatory Visit: Payer: Self-pay

## 2019-08-01 DIAGNOSIS — Z170 Estrogen receptor positive status [ER+]: Secondary | ICD-10-CM

## 2019-08-01 DIAGNOSIS — Z5112 Encounter for antineoplastic immunotherapy: Secondary | ICD-10-CM | POA: Diagnosis not present

## 2019-08-01 DIAGNOSIS — Z95828 Presence of other vascular implants and grafts: Secondary | ICD-10-CM

## 2019-08-01 DIAGNOSIS — C50211 Malignant neoplasm of upper-inner quadrant of right female breast: Secondary | ICD-10-CM | POA: Diagnosis not present

## 2019-08-01 LAB — CBC WITH DIFFERENTIAL (CANCER CENTER ONLY)
Abs Immature Granulocytes: 0.03 10*3/uL (ref 0.00–0.07)
Basophils Absolute: 0 10*3/uL (ref 0.0–0.1)
Basophils Relative: 1 %
Eosinophils Absolute: 0.1 10*3/uL (ref 0.0–0.5)
Eosinophils Relative: 2 %
HCT: 30.1 % — ABNORMAL LOW (ref 36.0–46.0)
Hemoglobin: 9.8 g/dL — ABNORMAL LOW (ref 12.0–15.0)
Immature Granulocytes: 1 %
Lymphocytes Relative: 45 %
Lymphs Abs: 2.2 10*3/uL (ref 0.7–4.0)
MCH: 29.3 pg (ref 26.0–34.0)
MCHC: 32.6 g/dL (ref 30.0–36.0)
MCV: 90.1 fL (ref 80.0–100.0)
Monocytes Absolute: 0.4 10*3/uL (ref 0.1–1.0)
Monocytes Relative: 8 %
Neutro Abs: 2.1 10*3/uL (ref 1.7–7.7)
Neutrophils Relative %: 43 %
Platelet Count: 238 10*3/uL (ref 150–400)
RBC: 3.34 MIL/uL — ABNORMAL LOW (ref 3.87–5.11)
RDW: 14.4 % (ref 11.5–15.5)
WBC Count: 4.8 10*3/uL (ref 4.0–10.5)
nRBC: 0 % (ref 0.0–0.2)

## 2019-08-01 LAB — CMP (CANCER CENTER ONLY)
ALT: 50 U/L — ABNORMAL HIGH (ref 0–44)
AST: 33 U/L (ref 15–41)
Albumin: 3.8 g/dL (ref 3.5–5.0)
Alkaline Phosphatase: 119 U/L (ref 38–126)
Anion gap: 12 (ref 5–15)
BUN: 20 mg/dL (ref 8–23)
CO2: 23 mmol/L (ref 22–32)
Calcium: 8.7 mg/dL — ABNORMAL LOW (ref 8.9–10.3)
Chloride: 105 mmol/L (ref 98–111)
Creatinine: 0.9 mg/dL (ref 0.44–1.00)
GFR, Est AFR Am: >60 mL/min (ref >60)
GFR, Estimated: >60 mL/min (ref >60)
Glucose, Bld: 113 mg/dL — ABNORMAL HIGH (ref 70–99)
Potassium: 3.9 mmol/L (ref 3.5–5.1)
Sodium: 140 mmol/L (ref 135–145)
Total Bilirubin: 0.7 mg/dL (ref 0.3–1.2)
Total Protein: 6.9 g/dL (ref 6.5–8.1)

## 2019-08-01 MED ORDER — FAMOTIDINE IN NACL 20-0.9 MG/50ML-% IV SOLN
INTRAVENOUS | Status: AC
  Filled 2019-08-01: qty 50

## 2019-08-01 MED ORDER — DIPHENHYDRAMINE HCL 50 MG/ML IJ SOLN
25.0000 mg | Freq: Once | INTRAMUSCULAR | Status: AC
  Administered 2019-08-01: 25 mg via INTRAVENOUS

## 2019-08-01 MED ORDER — ONDANSETRON HCL 8 MG PO TABS
ORAL_TABLET | ORAL | Status: AC
  Filled 2019-08-01: qty 1

## 2019-08-01 MED ORDER — ACETAMINOPHEN 325 MG PO TABS
650.0000 mg | ORAL_TABLET | Freq: Once | ORAL | Status: AC
  Administered 2019-08-01: 650 mg via ORAL

## 2019-08-01 MED ORDER — ACETAMINOPHEN 325 MG PO TABS
ORAL_TABLET | ORAL | Status: AC
  Filled 2019-08-01: qty 2

## 2019-08-01 MED ORDER — TRASTUZUMAB-ANNS CHEMO 150 MG IV SOLR
2.0000 mg/kg | Freq: Once | INTRAVENOUS | Status: AC
  Administered 2019-08-01: 189 mg via INTRAVENOUS
  Filled 2019-08-01: qty 9

## 2019-08-01 MED ORDER — SODIUM CHLORIDE 0.9 % IV SOLN
80.0000 mg/m2 | Freq: Once | INTRAVENOUS | Status: AC
  Administered 2019-08-01: 174 mg via INTRAVENOUS
  Filled 2019-08-01: qty 29

## 2019-08-01 MED ORDER — SODIUM CHLORIDE 0.9 % IV SOLN
Freq: Once | INTRAVENOUS | Status: AC
  Administered 2019-08-01: 11:00:00 via INTRAVENOUS
  Filled 2019-08-01: qty 250

## 2019-08-01 MED ORDER — DIPHENHYDRAMINE HCL 50 MG/ML IJ SOLN
INTRAMUSCULAR | Status: AC
  Filled 2019-08-01: qty 1

## 2019-08-01 MED ORDER — HEPARIN SOD (PORK) LOCK FLUSH 100 UNIT/ML IV SOLN
500.0000 [IU] | Freq: Once | INTRAVENOUS | Status: AC | PRN
  Administered 2019-08-01: 500 [IU]
  Filled 2019-08-01: qty 5

## 2019-08-01 MED ORDER — SODIUM CHLORIDE 0.9% FLUSH
10.0000 mL | Freq: Once | INTRAVENOUS | Status: AC
  Administered 2019-08-01: 10 mL
  Filled 2019-08-01: qty 10

## 2019-08-01 MED ORDER — ONDANSETRON HCL 8 MG PO TABS
8.0000 mg | ORAL_TABLET | Freq: Once | ORAL | Status: AC
  Administered 2019-08-01: 8 mg via ORAL

## 2019-08-01 MED ORDER — FAMOTIDINE IN NACL 20-0.9 MG/50ML-% IV SOLN
20.0000 mg | Freq: Once | INTRAVENOUS | Status: AC
  Administered 2019-08-01: 20 mg via INTRAVENOUS

## 2019-08-01 MED ORDER — SODIUM CHLORIDE 0.9% FLUSH
10.0000 mL | INTRAVENOUS | Status: DC | PRN
  Administered 2019-08-01: 10 mL
  Filled 2019-08-01: qty 10

## 2019-08-01 NOTE — Patient Instructions (Signed)
[facility] Discharge Instructions for Patients Receiving Chemotherapy  Today you received the following chemotherapy agents: Trastuzumab-anns (Kanjinti) and Paclitaxel (Taxol)  To help prevent nausea and vomiting after your treatment, we encourage you to take your nausea medication as directed.    If you develop nausea and vomiting that is not controlled by your nausea medication, call the clinic.   BELOW ARE SYMPTOMS THAT SHOULD BE REPORTED IMMEDIATELY:  *FEVER GREATER THAN 100.5 F  *CHILLS WITH OR WITHOUT FEVER  NAUSEA AND VOMITING THAT IS NOT CONTROLLED WITH YOUR NAUSEA MEDICATION  *UNUSUAL SHORTNESS OF BREATH  *UNUSUAL BRUISING OR BLEEDING  TENDERNESS IN MOUTH AND THROAT WITH OR WITHOUT PRESENCE OF ULCERS  *URINARY PROBLEMS  *BOWEL PROBLEMS  UNUSUAL RASH Items with * indicate a potential emergency and should be followed up as soon as possible.  Feel free to call the clinic should you have any questions or concerns. The clinic phone number is [phone].  Please show the [place] at check-in to the Emergency Department and triage nurse.  Coronavirus (COVID-19) Are you at risk?  Are you at risk for the Coronavirus (COVID-19)?  To be considered HIGH RISK for Coronavirus (COVID-19), you have to meet the following criteria:  . Traveled to [place], [place], [place], [place] or [place]; or in the [place] to [place], [place], [place], or [place]; and have fever, cough, and shortness of breath within the last 2 weeks of travel OR . Been in close contact with a person diagnosed with COVID-19 within the last 2 weeks and have fever, cough, and shortness of breath . IF YOU DO NOT MEET THESE CRITERIA, YOU ARE CONSIDERED LOW RISK FOR COVID-19.  What to do if you are HIGH RISK for COVID-19?  Marland Kitchen If you are having a medical emergency, call 911. . Seek medical care right away. Before you go to a doctor's office, urgent care or emergency  department, call ahead and tell them about your recent travel, contact with someone diagnosed with COVID-19, and your symptoms. You should receive instructions from your physician's office regarding next steps of care.  . When you arrive at healthcare provider, tell the healthcare staff immediately you have returned from visiting [place], [place], [place], [place] or [place]; or traveled in the [place] to [place], [place], [place], or [place]; in the last two weeks or you have been in close contact with a person diagnosed with COVID-19 in the last 2 weeks.   . Tell the health care staff about your symptoms: fever, cough and shortness of breath. . After you have been seen by a medical provider, you will be either: o Tested for (COVID-19) and discharged home on quarantine except to seek medical care if symptoms worsen, and asked to  - Stay home and avoid contact with others until you get your results (4-5 days)  - Avoid travel on public transportation if possible (such as bus, train, or airplane) or o Sent to the Emergency Department by EMS for evaluation, COVID-19 testing, and possible admission depending on your condition and test results.  What to do if you are LOW RISK for COVID-19?  Reduce your risk of any infection by using the same precautions used for avoiding the common cold or flu:  Marland Kitchen Wash your hands often with soap and warm water for at least 20 seconds.  If soap and water are not readily available, use an alcohol-based hand sanitizer with at least 60% alcohol.  Marland Kitchen  If coughing or sneezing, cover your mouth and nose by coughing or sneezing into the elbow areas of your shirt or coat, into a tissue or into your sleeve (not your hands). . Avoid shaking hands with others and consider head nods or verbal greetings only. . Avoid touching your eyes, nose, or mouth with unwashed hands.  . Avoid close contact with people who are sick. . Avoid places or events with large numbers of people  in one location, like concerts or sporting events. . Carefully consider travel plans you have or are making. . If you are planning any travel outside or inside the [place], visit the CDC's Travelers' Health webpage for the latest health notices. . If you have some symptoms but not all symptoms, continue to monitor at home and seek medical attention if your symptoms worsen. . If you are having a medical emergency, call 911.   ADDITIONAL HEALTHCARE OPTIONS FOR PATIENTS  [facility] / e-Visit: [email or web address]         [facility]: [phone]  [facility]: [phone]                   [facility]: [phone]   

## 2019-08-01 NOTE — Assessment & Plan Note (Signed)
04/19/2019:Screening mammogram detected right breast asymmetry. Diagnostic mammogram and US showed 2 indeterminate right breast masses, 37m at 1 o'clock, 730mat 12 o'clock, with no axillary adenopathy. 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 Stage Ia  05/30/2019: [facility], grade 3, 0.6cm, clear margins, 3 axillary lymph nodes negative.HER-2 positive (3+), ER+ (80%), PR -, Ki67 40%. Stage Ia  Treatment plan: 1.Adjuvant chemo with Taxol Herceptinplan to start 06/27/2019 2. Adjuvant radiation therapy followed by 3. Adjuvant antiestrogen therapy ------------------------------------------------------------------------------------------------------------------------------------------------------- Current treatment: Cycle6day 1 Taxol Herceptin Labs reviewed Echocardiogram EF 60 to 65%  Chemo toxicities: Chemotherapy-induced anemia: Hemoglobin is 10.1 and will be monitored Herceptin-induced diarrhea: Instructed her on how to take Imodium. Monitoring closely for neuropathy and other toxicities.  Return to clinicweekly for chemo and every other week for follow-up with me.

## 2019-08-08 ENCOUNTER — Other Ambulatory Visit: Payer: Self-pay

## 2019-08-08 ENCOUNTER — Inpatient Hospital Stay: Payer: Medicare Other

## 2019-08-08 ENCOUNTER — Inpatient Hospital Stay: Payer: Medicare Other | Attending: Hematology and Oncology

## 2019-08-08 VITALS — BP 144/66 | HR 70 | Temp 98.7°F | Resp 18 | Ht 67.0 in | Wt 209.0 lb

## 2019-08-08 DIAGNOSIS — C50211 Malignant neoplasm of upper-inner quadrant of right female breast: Secondary | ICD-10-CM

## 2019-08-08 DIAGNOSIS — Z5111 Encounter for antineoplastic chemotherapy: Secondary | ICD-10-CM | POA: Diagnosis present

## 2019-08-08 DIAGNOSIS — Z95828 Presence of other vascular implants and grafts: Secondary | ICD-10-CM

## 2019-08-08 DIAGNOSIS — Z5112 Encounter for antineoplastic immunotherapy: Secondary | ICD-10-CM | POA: Insufficient documentation

## 2019-08-08 DIAGNOSIS — Z170 Estrogen receptor positive status [ER+]: Secondary | ICD-10-CM

## 2019-08-08 LAB — CBC WITH DIFFERENTIAL (CANCER CENTER ONLY)
Abs Immature Granulocytes: 0.04 10*3/uL (ref 0.00–0.07)
Basophils Absolute: 0.1 10*3/uL (ref 0.0–0.1)
Basophils Relative: 1 %
Eosinophils Absolute: 0 10*3/uL (ref 0.0–0.5)
Eosinophils Relative: 1 %
HCT: 29 % — ABNORMAL LOW (ref 36.0–46.0)
Hemoglobin: 9.6 g/dL — ABNORMAL LOW (ref 12.0–15.0)
Immature Granulocytes: 1 %
Lymphocytes Relative: 39 %
Lymphs Abs: 2.2 10*3/uL (ref 0.7–4.0)
MCH: 30.3 pg (ref 26.0–34.0)
MCHC: 33.1 g/dL (ref 30.0–36.0)
MCV: 91.5 fL (ref 80.0–100.0)
Monocytes Absolute: 0.5 10*3/uL (ref 0.1–1.0)
Monocytes Relative: 9 %
Neutro Abs: 2.9 10*3/uL (ref 1.7–7.7)
Neutrophils Relative %: 49 %
Platelet Count: 264 10*3/uL (ref 150–400)
RBC: 3.17 MIL/uL — ABNORMAL LOW (ref 3.87–5.11)
RDW: 15 % (ref 11.5–15.5)
WBC Count: 5.8 10*3/uL (ref 4.0–10.5)
nRBC: 0 % (ref 0.0–0.2)

## 2019-08-08 LAB — CMP (CANCER CENTER ONLY)
ALT: 74 U/L — ABNORMAL HIGH (ref 0–44)
AST: 41 U/L (ref 15–41)
Albumin: 3.8 g/dL (ref 3.5–5.0)
Alkaline Phosphatase: 119 U/L (ref 38–126)
Anion gap: 9 (ref 5–15)
BUN: 26 mg/dL — ABNORMAL HIGH (ref 8–23)
CO2: 25 mmol/L (ref 22–32)
Calcium: 9.3 mg/dL (ref 8.9–10.3)
Chloride: 105 mmol/L (ref 98–111)
Creatinine: 0.95 mg/dL (ref 0.44–1.00)
GFR, Est AFR Am: >60 mL/min (ref >60)
GFR, Estimated: >60 mL/min (ref >60)
Glucose, Bld: 134 mg/dL — ABNORMAL HIGH (ref 70–99)
Potassium: 4.2 mmol/L (ref 3.5–5.1)
Sodium: 139 mmol/L (ref 135–145)
Total Bilirubin: 0.6 mg/dL (ref 0.3–1.2)
Total Protein: 7.1 g/dL (ref 6.5–8.1)

## 2019-08-08 MED ORDER — SODIUM CHLORIDE 0.9% FLUSH
10.0000 mL | Freq: Once | INTRAVENOUS | Status: AC
  Administered 2019-08-08: 10 mL
  Filled 2019-08-08: qty 10

## 2019-08-08 MED ORDER — ONDANSETRON HCL 8 MG PO TABS
ORAL_TABLET | ORAL | Status: AC
  Filled 2019-08-08: qty 1

## 2019-08-08 MED ORDER — TRASTUZUMAB-ANNS CHEMO 150 MG IV SOLR
2.0000 mg/kg | Freq: Once | INTRAVENOUS | Status: AC
  Administered 2019-08-08: 189 mg via INTRAVENOUS
  Filled 2019-08-08: qty 9

## 2019-08-08 MED ORDER — DIPHENHYDRAMINE HCL 50 MG/ML IJ SOLN
25.0000 mg | Freq: Once | INTRAMUSCULAR | Status: AC
  Administered 2019-08-08: 25 mg via INTRAVENOUS

## 2019-08-08 MED ORDER — HEPARIN SOD (PORK) LOCK FLUSH 100 UNIT/ML IV SOLN
500.0000 [IU] | Freq: Once | INTRAVENOUS | Status: AC | PRN
  Administered 2019-08-08: 17:00:00 500 [IU]
  Filled 2019-08-08: qty 5

## 2019-08-08 MED ORDER — FAMOTIDINE IN NACL 20-0.9 MG/50ML-% IV SOLN
INTRAVENOUS | Status: AC
  Filled 2019-08-08: qty 50

## 2019-08-08 MED ORDER — DIPHENHYDRAMINE HCL 50 MG/ML IJ SOLN
INTRAMUSCULAR | Status: AC
  Filled 2019-08-08: qty 1

## 2019-08-08 MED ORDER — ACETAMINOPHEN 325 MG PO TABS
ORAL_TABLET | ORAL | Status: AC
  Filled 2019-08-08: qty 2

## 2019-08-08 MED ORDER — SODIUM CHLORIDE 0.9% FLUSH
10.0000 mL | INTRAVENOUS | Status: DC | PRN
  Administered 2019-08-08: 10 mL
  Filled 2019-08-08: qty 10

## 2019-08-08 MED ORDER — FAMOTIDINE IN NACL 20-0.9 MG/50ML-% IV SOLN
20.0000 mg | Freq: Once | INTRAVENOUS | Status: AC
  Administered 2019-08-08: 20 mg via INTRAVENOUS

## 2019-08-08 MED ORDER — ACETAMINOPHEN 325 MG PO TABS
650.0000 mg | ORAL_TABLET | Freq: Once | ORAL | Status: AC
  Administered 2019-08-08: 650 mg via ORAL

## 2019-08-08 MED ORDER — ONDANSETRON HCL 8 MG PO TABS
8.0000 mg | ORAL_TABLET | Freq: Once | ORAL | Status: AC
  Administered 2019-08-08: 8 mg via ORAL

## 2019-08-08 MED ORDER — SODIUM CHLORIDE 0.9 % IV SOLN
Freq: Once | INTRAVENOUS | Status: AC
  Administered 2019-08-08: 14:00:00 via INTRAVENOUS
  Filled 2019-08-08: qty 250

## 2019-08-08 MED ORDER — SODIUM CHLORIDE 0.9 % IV SOLN
80.0000 mg/m2 | Freq: Once | INTRAVENOUS | Status: AC
  Administered 2019-08-08: 174 mg via INTRAVENOUS
  Filled 2019-08-08: qty 29

## 2019-08-11 ENCOUNTER — Other Ambulatory Visit: Payer: Self-pay

## 2019-08-11 ENCOUNTER — Ambulatory Visit: Payer: Medicare Other | Attending: General Surgery | Admitting: Physical Therapy

## 2019-08-11 ENCOUNTER — Encounter: Payer: Self-pay | Admitting: Physical Therapy

## 2019-08-11 DIAGNOSIS — M79621 Pain in right upper arm: Secondary | ICD-10-CM

## 2019-08-11 DIAGNOSIS — M6281 Muscle weakness (generalized): Secondary | ICD-10-CM | POA: Diagnosis present

## 2019-08-11 DIAGNOSIS — M25611 Stiffness of right shoulder, not elsewhere classified: Secondary | ICD-10-CM | POA: Diagnosis not present

## 2019-08-11 NOTE — Therapy (Signed)
[place], [name], [address] Phone: [phone]   Fax:  [phone]  Physical Therapy Evaluation  Patient Details  Name: Kathy Reese MRN: [id number] Date of Birth: Dec 10, 1950 Referring Provider (PT): Barry Dienes   Encounter Date: 08/11/2019  PT End of Session - 08/11/19 1142    Visit Number  1    Number of Visits  9    Date for PT Re-Evaluation  09/08/19    PT Start Time  1104    PT Stop Time  1140    PT Time Calculation (min)  36 min    Activity Tolerance  Patient tolerated treatment well    Behavior During Therapy  [facility] for tasks assessed/performed       Past Medical History:  Diagnosis Date  . Allergic rhinitis, cause unspecified   . Arthritis   . Depression   . Diverticulosis   . History of Bell's palsy   . Hypersomnia   . OSA on CPAP    not using at this time 06/15/19  . Other and unspecified hyperlipidemia   . Personal history of malignant neoplasm of ovary   . Type II or unspecified type diabetes mellitus without mention of complication, not stated as uncontrolled   . Unspecified essential hypertension   . Vertigo     Past Surgical History:  Procedure Laterality Date  . ABDOMINAL ADHESION SURGERY  12/96   exc peritoneal cyst  . BREAST BIOPSY Left 2000   stereotactic, negative  . BREAST LUMPECTOMY WITH RADIOACTIVE SEED AND SENTINEL LYMPH NODE BIOPSY Right 05/30/2019   Procedure: RIGHT BREAST LUMPECTOMY WITH BRACKETED RADIOACTIVE SEEDS  AND SENTINEL LYMPH NODE BIOPSY;  Surgeon: Stark Klein, MD;  Location: [place];  Service: General;  Laterality: Right;  . BREAST REDUCTION SURGERY Bilateral 10/03  . EYE SURGERY Left    cataracts  . LAPAROSCOPIC SALPINGO OOPHERECTOMY Right 12/96  . 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 LAPAROSCOPY ABDOMEN DIAGNOSTIC  2/08   w/LOA, LSO  . PORTACATH PLACEMENT Left 06/16/2019   Procedure:  INSERTION PORT-A-CATH;  Surgeon: Stark Klein, MD;  Location: [facility];  Service: General;  Laterality: Left;  . REDUCTION MAMMAPLASTY Bilateral 2004  . TONSILLECTOMY    . TOTAL ABDOMINAL HYSTERECTOMY W/ BILATERAL SALPINGOOPHORECTOMY  5/94   secondary to uterine fibroids  . TOTAL KNEE ARTHROPLASTY Right 05/12/2013   Procedure: TOTAL KNEE ARTHROPLASTY;  Surgeon: Alta Corning, MD;  Location: [facility];  Service: Orthopedics;  Laterality: Right;  . TRIGGER FINGER RELEASE  07/06/2012   Procedure: RELEASE TRIGGER FINGER/A-1 PULLEY;  Surgeon: Alta Corning, MD;  Location: [facility];  Service: Orthopedics;  Laterality: Left;  left ring finger  . TUBAL LIGATION      There were no vitals filed for this visit.   Subjective Assessment - 08/11/19 1106    Subjective  If I am going to reach up high I can feel it pulling down my arm. The doctor said I was really tight. My hand started swelling yesterday.    Pertinent History  R breast cancer, s/p R lumpectomy and SLNB on 05/30/19, 3 nodes removed all negative, currently undergoing chemo, pt will begin radiation after chemo    Patient Stated Goals  to relieve that pain and get back to normal    Currently in Pain?  No/denies    Pain Score  0-No pain    Multiple Pain Sites  No         [facility] PT Assessment - 08/11/19 0001      Assessment   Medical Diagnosis  R breast cancer    Referring Provider (PT)  [place]    Onset Date/Surgical Date  05/30/19    Hand Dominance  Right    Prior Therapy  none      Precautions   Precautions  Other (comment)    Precaution Comments  at risk for lymphedema      Restrictions   Weight Bearing Restrictions  No      Balance Screen   Has the patient fallen in the past 6 months  No    Has the patient had a decrease in activity level because of a fear of falling?   No    Is the patient reluctant to leave their home because of a fear of falling?   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   Leisure  walks about once per week      Cognition   Overall Cognitive Status  Within Functional Limits for tasks assessed      Observation/Other Assessments   Observations  palpable cording at R antecubital fossa and axilla      Posture/Postural Control   Posture/Postural Control  Postural limitations    Postural Limitations  Rounded Shoulders;Forward head      ROM / Strength   AROM / PROM / Strength  AROM      AROM   AROM Assessment Site  Shoulder    Right/Left Shoulder  Right;Left    Right Shoulder Flexion  137 Degrees    Right Shoulder ABduction  122 Degrees    Right Shoulder Internal Rotation  63 Degrees    Right Shoulder External Rotation  74 Degrees    Left Shoulder Flexion  155 Degrees    Left Shoulder ABduction  170 Degrees    Left Shoulder Internal Rotation  68 Degrees    Left Shoulder External Rotation  86 Degrees        LYMPHEDEMA/ONCOLOGY QUESTIONNAIRE - 08/11/19 1120      Lymphedema Assessments   Lymphedema Assessments  Upper extremities      Right Upper Extremity Lymphedema   15 cm Proximal to Olecranon Process  42 cm    Olecranon Process  29 cm    15 cm Proximal to Ulnar Styloid Process  26 cm    Just Proximal to Ulnar Styloid Process  19 cm    Across Hand at [employer]  20.1 cm    At [place] of 2nd Digit  7 cm      Left Upper Extremity Lymphedema   15 cm Proximal to Olecranon Process  41.5 cm    Olecranon Process  28 cm    15 cm Proximal to Ulnar Styloid Process  25.9 cm    Just Proximal to Ulnar Styloid Process  18.2 cm    Across Hand at [employer]  19 cm    At [place] of 2nd Digit  6.9 cm             Objective  measurements completed on examination: See  above findings.      [facility] Adult PT Treatment/Exercise - 08/11/19 0001      Exercises   Exercises  Shoulder      Shoulder Exercises: Supine   Horizontal ABduction  AAROM;Right;10 reps   with dowel with 3 sec holds   Flexion  AAROM;Both;10 reps   with dowel with 3 sec holds            PT Education - 08/11/19 1140    Education Details  anatomy and physiology of lymphatic system, axillary cording, post op breast exercises, supine dowel exercises    Person(s) Educated  Patient    Methods  Explanation;Handout    Comprehension  Verbalized understanding;Returned demonstration          PT Long Term Goals - 08/11/19 1148      PT LONG TERM GOAL #1   Title  Pt will demonstrate 155 degrees of R shoulder flexion to allow her to reach overhead.    Baseline  137    Time  4    Period  Weeks    Status  New    Target Date  09/08/19      PT LONG TERM GOAL #2   Title  Pt will demonstrates 160 degrees of R shoulder abduction to allow her to reach out to the side.    Baseline  122    Time  4    Period  Weeks    Status  New    Target Date  09/08/19      PT LONG TERM GOAL #3   Title  Pt will have no palpable cording in R antecubital fossa to allow her to reach overhead without discomfort.    Baseline  numerous cords palpable    Time  4    Period  Weeks    Status  New    Target Date  09/08/19      PT LONG TERM GOAL #4   Title  Pt will be independent in a home exercise program for continued strengthening and stretching.    Time  4    Period  Weeks    Status  New    Target Date  09/08/19      PT LONG TERM GOAL #5   Title  Pt will report a 75% improvement in tightness across R pec muscle to allow pt to move arm without discomfort.    Time  4    Period  Weeks    Status  New    Target Date  09/08/19             Plan - 08/11/19 1142    Clinical Impression Statement  Pt presents to PT with decrease R shoulder ROM, axillary cording, R pec tightness and decrease scar  mobility following a R lumpectomy and SLNB on 05/30/19. Pt is currently undergoing chemo and will begin radiation after chemo. She has numerous cords palpable at antecubital fossa and in R axilla that are limiting her ROM and causing pain and discomfort. She has developed some right hand swelling and has some redness at antecubital fossa which may be a reaction to something and is following up with her doctor on this. Pt would benefit from skilled PT services to improve R shoulder ROM, decrease axillary cording, decrease R pec tight and improve scar mobility.    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, sister brings her to appts    Examination-Activity Limitations  Reach Overhead;Carry;Lift    Examination-Participation Restrictions  Cleaning;Meal Prep;Laundry    Stability/Clinical Decision Making  Evolving/Moderate complexity    Clinical Decision Making  Moderate    Rehab Potential  Excellent    PT Frequency  2x / week    PT Duration  4 weeks    PT Treatment/Interventions  ADLs/Self Care Home Management;Therapeutic activities;Therapeutic exercise;Patient/family education;Manual lymph drainage;Manual techniques;Compression bandaging;Scar mobilization;Passive range of motion;Taping;Joint Manipulations    PT Next Visit Plan  begin STM and myofascial to cording in R antecubital fossa, AAROM exercises and PROM to R shoulder, R pec stretches, scar mobilization    PT Home Exercise Plan  post op breast, supine dowel exercises    Consulted and Agree with Plan of Care  Patient       Patient will benefit from skilled therapeutic intervention in order to improve the following deficits and impairments:  Decreased range of motion, Decreased scar mobility, Increased edema, Decreased knowledge of precautions, Decreased strength, Increased fascial restricitons, Impaired UE functional use,  Postural dysfunction, Pain  Visit Diagnosis: Stiffness of right shoulder, not elsewhere classified  Pain in right upper arm  Muscle weakness (generalized)     Problem List Patient Active Problem List   Diagnosis Date Noted  . Port-A-Cath in place 07/11/2019  . Malignant neoplasm of upper-inner quadrant of right breast in female, estrogen receptor positive ([place]) 04/19/2019  . Facial weakness 11/07/2018  . 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 History of hysterectomy 10/12/2018  . History of bilateral salpingo-oophorectomy (BSO) 10/12/2018  . Osteoarthritis of right knee 05/12/2013  . HYPERLIPIDEMIA 12/30/2007  . HYPERTENSION 12/30/2007  . INSOMNIA 12/30/2007  . HYPERSOMNIA 12/30/2007  . DM w/o Complication Type II [id number]  . HYPERCHOLESTEROLEMIA 12/16/2007  . EXOGENOUS OBESITY 12/16/2007  . SLEEP APNEA, OBSTRUCTIVE 12/16/2007  . ALLERGIC RHINITIS 12/16/2007    Allyson Sabal [facility] 08/11/2019, 11:51 AM  [place], [name], [address] Phone: [phone]   Fax:  [phone]  Name: Kathy Reese MRN: [id number] Date of Birth: July 20, 1951  Manus Gunning, PT 08/11/19 11:51 AM

## 2019-08-11 NOTE — Patient Instructions (Signed)
Shoulder: Flexion (Supine)    With hands shoulder width apart, slowly lower dowel to floor behind head. Do not let elbows bend. Keep back flat. Hold _5-30___ seconds. Repeat __10__ times. Do __2__ sessions per day. CAUTION: Stretch slowly and gently.  Copyright  VHI. All rights reserved.  Shoulder: Abduction (Supine)    With right arm flat on floor, hold dowel in palm. Slowly move arm up to side of head by pushing with opposite arm. Do not let elbow bend. Hold _5-30___ seconds. Repeat _10___ times. Do __2__ sessions per day. CAUTION: Stretch slowly and gently.  Copyright  VHI. All rights reserved.

## 2019-08-14 ENCOUNTER — Ambulatory Visit: Payer: Medicare Other | Admitting: Physical Therapy

## 2019-08-14 ENCOUNTER — Other Ambulatory Visit: Payer: Self-pay

## 2019-08-14 ENCOUNTER — Encounter: Payer: Self-pay | Admitting: Physical Therapy

## 2019-08-14 DIAGNOSIS — M25611 Stiffness of right shoulder, not elsewhere classified: Secondary | ICD-10-CM | POA: Diagnosis not present

## 2019-08-14 DIAGNOSIS — M79621 Pain in right upper arm: Secondary | ICD-10-CM

## 2019-08-14 DIAGNOSIS — M6281 Muscle weakness (generalized): Secondary | ICD-10-CM

## 2019-08-14 NOTE — Progress Notes (Signed)
Patient Care Team: Nolene Ebbs, MD as PCP - General (Internal Medicine) Mauro Kaufmann, RN as Oncology Nurse Navigator Rockwell Germany, RN as Oncology Nurse Navigator  DIAGNOSIS:    ICD-10-CM   1. Malignant neoplasm of upper-inner quadrant of right breast in female, estrogen receptor positive ([place])  C50.211    Z17.0     SUMMARY OF ONCOLOGIC HISTORY: Oncology History  Malignant neoplasm of upper-inner quadrant of right breast in female, estrogen receptor positive ([place])  04/12/2019 Cancer Staging   Staging form: Breast, AJCC 8th Edition - Clinical stage from 04/12/2019: Stage IA (cT1b, cN0, cM0, G3, ER+, PR-, HER2+) - Signed by Gardenia Phlegm, NP on 04/19/2019   04/19/2019 Initial Diagnosis   Screening mammogram detected right breast asymmetry. Diagnostic mammogram and US showed 2 indeterminate right breast masses, 74m at 1 o'clock, 771mat 12 o'clock, with no axillary adenopathy. 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    05/30/2019 Surgery   Right lumpectomy (BBarry Dienes IDC with DCIS, grade 3, 0.6cm, clear margins, 3 axillary lymph nodes negative.    06/06/2019 Cancer Staging   Staging form: Breast, AJCC 8th Edition - Pathologic: Stage IA (pT1b, pN0, cM0, G3, ER+, PR-, HER2+) - Signed by GuNicholas LoseMD on 06/06/2019   06/27/2019 -  Chemotherapy   The patient had PACLitaxel (TAXOL) 174 mg in sodium chloride 0.9 % 250 mL chemo infusion (</= 8015m2), 80 mg/m2 = 174 mg, Intravenous,  Once, 2 of 3 cycles Administration: 174 mg (06/27/2019), 174 mg (07/04/2019), 174 mg (07/25/2019), 174 mg (07/11/2019), 174 mg (07/18/2019), 174 mg (08/01/2019), 174 mg (08/08/2019) trastuzumab-anns (KANJINTI) 399 mg in sodium chloride 0.9 % 250 mL chemo infusion, 4 mg/kg = 399 mg (100 % of original dose 4 mg/kg), Intravenous,  Once, 2 of 16 cycles Dose modification: 4 mg/kg (original dose 4 mg/kg, Cycle 1, Reason: Other (see comments), Comment: Biosimilar  Conversion; [facility] mg/kg (original dose 2 mg/kg, Cycle 3, Reason: Other (see comments), Comment: switch to maintenance q3 weeks), 600 mg (original dose 2 mg/kg, Cycle 3, Reason: Other (see comments), Comment: maint q3 weeks) Administration: 399 mg (06/27/2019), 189 mg (07/04/2019), 189 mg (07/11/2019), 189 mg (07/18/2019), 189 mg (07/25/2019), 189 mg (08/01/2019), 189 mg (08/08/2019)  for chemotherapy treatment.      CHIEF COMPLIANT: Cycle8Taxol Herceptin  INTERVAL HISTORY: Trenda TisLeonilda Cozby a 67 13o. with above-mentioned history of right breast cancerwho underwent a lumpectomy. She is currently on adjuvant chemotherapy with weekly Taxol and Herceptin.She presents to the clinic todayforcycle8.  She has noticed mild neuropathy in the tips of the fingers but it has not affected her functioning.  There is no neuropathy in the feet.  She has noticed darkening discoloration around the plantar side of the elbow.  She started using coconut oil-containing soap and it has improved markedly.  REVIEW OF SYSTEMS:   Constitutional: Denies fevers, chills or abnormal weight loss Eyes: Denies blurriness of vision Ears, nose, mouth, throat, and face: Denies mucositis or sore throat Respiratory: Denies cough, dyspnea or wheezes Cardiovascular: Denies palpitation, chest discomfort Gastrointestinal: Denies nausea, heartburn or change in bowel habits Skin: Dark skin discoloration on the plantar side of the elbows Lymphatics: Denies new lymphadenopathy or easy bruising Neurological: Mild numbness of the tips of the fingers Behavioral/Psych: Mood is stable, no new changes  Extremities: No lower extremity edema Breast: denies any pain or lumps or nodules in either breasts All other systems were reviewed with  the patient and are negative.  I have reviewed the past medical history, past surgical history, social history and family history with the patient and they are unchanged from previous note.   ALLERGIES:  has No Known Allergies.  MEDICATIONS:  Current Outpatient Medications  Medication Sig Dispense Refill  . aspirin EC 81 MG tablet Take 81 mg by mouth daily.    Marland Kitchen atorvastatin (LIPITOR) 40 MG tablet Take 40 mg by mouth daily at 6 PM.     . Calcium Carb-Cholecalciferol (CALCIUM 600+D3 PO) Take 1 tablet by mouth 2 (two) times a day.    . cholecalciferol (VITAMIN D3) 25 MCG (1000 UT) tablet Take 1,000 Units by mouth daily.    . Cinnamon 500 MG capsule Take 500 mg by mouth daily.     . citalopram (CELEXA) 10 MG tablet Take 10 mg by mouth daily.    . insulin detemir (LEVEMIR) 100 UNIT/ML injection Inject 12-15 Units into the skin See admin instructions. Inject 15 units subcutaneously in the morning & 12 units subcutaneously at night.    . lidocaine-prilocaine (EMLA) cream Apply to affected area once 30 g 3  . LORazepam (ATIVAN) 0.5 MG tablet Take 1 tablet (0.5 mg total) by mouth at bedtime as needed for sleep. 30 tablet 0  . losartan-hydrochlorothiazide (HYZAAR) 100-25 MG tablet Take 1 tablet by mouth daily.  4  . meloxicam (MOBIC) 15 MG tablet Take 15 mg by mouth daily as needed for pain.     . metFORMIN (GLUCOPHAGE) 500 MG tablet Take 500 mg by mouth every morning.     . metoprolol (TOPROL-XL) 50 MG 24 hr tablet Take 50 mg by mouth daily.      . ondansetron (ZOFRAN) 8 MG tablet Take 1 tablet (8 mg total) by mouth 2 (two) times daily as needed (Nausea or vomiting). 30 tablet 1  . oxyCODONE (OXY IR/ROXICODONE) 5 MG immediate release tablet Take 1 tablet (5 mg total) by mouth every 6 (six) hours as needed for severe pain. (Patient not taking: Reported on 06/14/2019) 20 tablet 0  . prochlorperazine (COMPAZINE) 10 MG tablet Take 1 tablet (10 mg total) by mouth every 6 (six) hours as needed (Nausea or vomiting). 30 tablet 1   No current facility-administered medications for this visit.     PHYSICAL EXAMINATION: ECOG PERFORMANCE STATUS: 1 - Symptomatic but completely ambulatory  Vitals:    08/15/19 1002  BP: (!) 135/57  Pulse: 70  Resp: 17  Temp: 98.4 F (36.9 C)  SpO2: 100%   Filed Weights   08/15/19 1002  Weight: 209 lb 14.4 oz (95.2 kg)    GENERAL: alert, no distress and comfortable SKIN: skin color, texture, turgor are normal, no rashes or significant lesions EYES: normal, Conjunctiva are pink and non-injected, sclera clear OROPHARYNX: no exudate, no erythema and lips, buccal mucosa, and tongue normal  NECK: supple, thyroid normal size, non-tender, without nodularity LYMPH: no palpable lymphadenopathy in the cervical, axillary or inguinal LUNGS: clear to auscultation and percussion with normal breathing effort HEART: regular rate & rhythm and no murmurs and no lower extremity edema ABDOMEN: abdomen soft, non-tender and normal bowel sounds MUSCULOSKELETAL: no cyanosis of digits and no clubbing  NEURO: alert & oriented x 3 with fluent speech, no focal motor/sensory deficits EXTREMITIES: No lower extremity edema    LABORATORY DATA:  I have reviewed the data as listed CMP Latest Ref Rng & Units 08/08/2019 08/01/2019 07/25/2019  Glucose 70 - 99 mg/dL 134(H) 113(H) 105(H)  BUN 8 -  23 mg/dL 26(H) 20 25(H)  Creatinine 0.44 - 1.00 mg/dL 0.95 0.90 0.86  Sodium 135 - 145 mmol/L 139 140 141  Potassium 3.5 - 5.1 mmol/L 4.2 3.9 4.5  Chloride 98 - 111 mmol/L 105 105 105  CO2 22 - 32 mmol/L 25 23 24   Calcium 8.9 - 10.3 mg/dL 9.3 8.7(L) 9.6  Total Protein 6.5 - 8.1 g/dL 7.1 6.9 6.8  Total Bilirubin 0.3 - 1.2 mg/dL 0.6 0.7 0.7  Alkaline Phos 38 - 126 U/L 119 119 104  AST 15 - 41 U/L 41 33 35  ALT 0 - 44 U/L 74(H) 50(H) 53(H)    Lab Results  Component Value Date   WBC 4.6 08/15/2019   HGB 8.9 (L) 08/15/2019   HCT 27.1 (L) 08/15/2019   MCV 91.2 08/15/2019   PLT 252 08/15/2019   NEUTROABS 2.1 08/15/2019    ASSESSMENT & PLAN:  Malignant neoplasm of upper-inner quadrant of right breast in female, estrogen receptor positive ([place]) 04/19/2019:Screening mammogram  detected right breast asymmetry. Diagnostic mammogram and US showed 2 indeterminate right breast masses, 19m at 1 o'clock, 725mat 12 o'clock, with no axillary adenopathy. 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 Stage Ia  05/30/2019: [facility], grade 3, 0.6cm, clear margins, 3 axillary lymph nodes negative.HER-2 positive (3+), ER+ (80%), PR -, Ki67 40%. Stage Ia  Treatment plan: 1.Adjuvant chemo with Taxol Herceptinplan to start 06/27/2019 2. Adjuvant radiation therapy followed by 3. Adjuvant antiestrogen therapy ------------------------------------------------------------------------------------------------------------------------------------------------------- Current treatment: Cycle8day 1 Taxol Herceptin Labs reviewed Echocardiogram EF 60 to 65%  Chemo toxicities: 1. Chemotherapy-induced anemia: Hemoglobin is 9.8 and will be monitored 2. Herceptin-induced diarrhea: Instructed her on how to take Imodium. 3. Chemo-induced peripheral neuropathy: Grade 1: We will continue to monitor and keep the dosage the same  4.  Discoloration of the plantar side of the elbows: Being monitored  Return to clinicweekly for chemo and every other week for follow-up with me.    No orders of the defined types were placed in this encounter.  The patient has a good understanding of the overall plan. she agrees with it. she will call with any problems that may develop before the next visit here.  GuNicholas LoseMD 08/15/2019  I,Julious Okaorshimer am acting as scribe for Dr. ViNicholas Lose I have reviewed the above documentation for accuracy and completeness, and I agree with the above.

## 2019-08-14 NOTE — Therapy (Signed)
[place], [name], [address] Phone: [phone]   Fax:  [phone]  Physical Therapy Treatment  Patient Details  Name: Kathy Reese MRN: [id number] Date of Birth: 10/14/1950 Referring Provider (PT): Barry Dienes   Encounter Date: 08/14/2019  PT End of Session - 08/14/19 1223    Visit Number  2    Number of Visits  9    Date for PT Re-Evaluation  09/08/19    PT Start Time  0905    PT Stop Time  0950    PT Time Calculation (min)  45 min    Activity Tolerance  Patient tolerated treatment well    Behavior During Therapy  [facility] for tasks assessed/performed       Past Medical History:  Diagnosis Date  . Allergic rhinitis, cause unspecified   . Arthritis   . Depression   . Diverticulosis   . History of Bell's palsy   . Hypersomnia   . OSA on CPAP    not using at this time 06/15/19  . Other and unspecified hyperlipidemia   . Personal history of malignant neoplasm of ovary   . Type II or unspecified type diabetes mellitus without mention of complication, not stated as uncontrolled   . Unspecified essential hypertension   . Vertigo     Past Surgical History:  Procedure Laterality Date  . ABDOMINAL ADHESION SURGERY  12/96   exc peritoneal cyst  . BREAST BIOPSY Left 2000   stereotactic, negative  . BREAST LUMPECTOMY WITH RADIOACTIVE SEED AND SENTINEL LYMPH NODE BIOPSY Right 05/30/2019   Procedure: RIGHT BREAST LUMPECTOMY WITH BRACKETED RADIOACTIVE SEEDS  AND SENTINEL LYMPH NODE BIOPSY;  Surgeon: Stark Klein, MD;  Location: [place];  Service: General;  Laterality: Right;  . BREAST REDUCTION SURGERY Bilateral 10/03  . EYE SURGERY Left    cataracts  . LAPAROSCOPIC SALPINGO OOPHERECTOMY Right 12/96  . 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 LAPAROSCOPY ABDOMEN DIAGNOSTIC  2/08   w/LOA, LSO  . PORTACATH PLACEMENT Left 06/16/2019   Procedure:  INSERTION PORT-A-CATH;  Surgeon: Stark Klein, MD;  Location: [place];  Service: General;  Laterality: Left;  . REDUCTION MAMMAPLASTY Bilateral 2004  . TONSILLECTOMY    . TOTAL ABDOMINAL HYSTERECTOMY W/ BILATERAL SALPINGOOPHORECTOMY  5/94   secondary to uterine fibroids  . TOTAL KNEE ARTHROPLASTY Right 05/12/2013   Procedure: TOTAL KNEE ARTHROPLASTY;  Surgeon: Alta Corning, MD;  Location: [place];  Service: Orthopedics;  Laterality: Right;  . TRIGGER FINGER RELEASE  07/06/2012   Procedure: RELEASE TRIGGER FINGER/A-1 PULLEY;  Surgeon: Alta Corning, MD;  Location: [place];  Service: Orthopedics;  Laterality: Left;  left ring finger  . TUBAL LIGATION      There were no vitals filed for this visit.  Subjective Assessment - 08/14/19 1221    Subjective  Pt states she walked with her granddaughter this weekend . She thinks her hand is better    Pertinent History  R breast cancer, s/p R lumpectomy and SLNB on 05/30/19, 3 nodes removed all negative, currently undergoing chemo, pt will begin radiation after chemo    Currently in Pain?  No/denies                       [facility] Adult PT Treatment/Exercise - 08/14/19 0001      Shoulder Exercises: Supine   Protraction  AROM;Right;10 reps  Flexion  AAROM;Both;10 reps   with dowel with 3 sec holds     Shoulder Exercises: Sidelying   Flexion  AROM;Right;10 reps    ABduction  AROM;Right;10 reps      Manual Therapy   Manual Therapy  Manual Lymphatic Drainage (MLD);Passive ROM    Manual Lymphatic Drainage (MLD)  deep breathing. stationary circles to right upper quadrant and right arm in supine and sidelying with extra time spent on axilla and upper lateral chest     Passive ROM  in supine and left sidelying to  right shoulder into flexion, abduction, scaption and diagonal pattern                   PT Long Term Goals - 08/11/19 1148      PT LONG TERM GOAL #1   Title  Pt will demonstrate 155 degrees of R  shoulder flexion to allow her to reach overhead.    Baseline  137    Time  4    Period  Weeks    Status  New    Target Date  09/08/19      PT LONG TERM GOAL #2   Title  Pt will demonstrates 160 degrees of R shoulder abduction to allow her to reach out to the side.    Baseline  122    Time  4    Period  Weeks    Status  New    Target Date  09/08/19      PT LONG TERM GOAL #3   Title  Pt will have no palpable cording in R antecubital fossa to allow her to reach overhead without discomfort.    Baseline  numerous cords palpable    Time  4    Period  Weeks    Status  New    Target Date  09/08/19      PT LONG TERM GOAL #4   Title  Pt will be independent in a home exercise program for continued strengthening and stretching.    Time  4    Period  Weeks    Status  New    Target Date  09/08/19      PT LONG TERM GOAL #5   Title  Pt will report a 75% improvement in tightness across R pec muscle to allow pt to move arm without discomfort.    Time  4    Period  Weeks    Status  New    Target Date  09/08/19            Plan - 08/14/19 1223    Clinical Impression Statement  Cording appears to be diminished today in antecubital fossa and axilla, though she still has fullness in axilla and right upper arm.  Focused on stretching and MLD today    Comorbidities  diabetes, ovarian cancer, caregiver for husband with dementia, sister brings her to appts    PT Frequency  2x / week    PT Duration  4 weeks    PT Treatment/Interventions  ADLs/Self Care Home Management;Therapeutic activities;Therapeutic exercise;Patient/family education;Manual lymph drainage;Manual techniques;Compression bandaging;Scar mobilization;Passive range of motion;Taping;Joint Manipulations    PT Next Visit Plan  Remeasure right arm cont STM and myofascial to cording in R antecubital fossa, AAROM exercises and PROM to R shoulder, R pec stretches, scar mobilization    PT Home Exercise Plan  post op breast, supine dowel  exercises       Patient will benefit from skilled therapeutic intervention  in order to improve the following deficits and impairments:  Decreased range of motion, Decreased scar mobility, Increased edema, Decreased knowledge of precautions, Decreased strength, Increased fascial restricitons, Impaired UE functional use, Postural dysfunction, Pain  Visit Diagnosis: Stiffness of right shoulder, not elsewhere classified  Pain in right upper arm  Muscle weakness (generalized)     Problem List Patient Active Problem List   Diagnosis Date Noted  . Port-A-Cath in place 07/11/2019  . Malignant neoplasm of upper-inner quadrant of right breast in female, estrogen receptor positive ([place]) 04/19/2019  . Facial weakness 11/07/2018  . 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 History of hysterectomy 10/12/2018  . History of bilateral salpingo-oophorectomy (BSO) 10/12/2018  . Osteoarthritis of right knee 05/12/2013  . HYPERLIPIDEMIA 12/30/2007  . HYPERTENSION 12/30/2007  . INSOMNIA 12/30/2007  . HYPERSOMNIA 12/30/2007  . DM w/o Complication Type II [id number]  . HYPERCHOLESTEROLEMIA 12/16/2007  . EXOGENOUS OBESITY 12/16/2007  . SLEEP APNEA, OBSTRUCTIVE 12/16/2007  . ALLERGIC RHINITIS 12/16/2007   Donato Heinz. Owens Shark PT  Norwood Levo 08/14/2019, 12:26 PM  [place], [name], [address] Phone: [phone]   Fax:  [phone]  Name: Kathy Reese MRN: [id number] Date of Birth: Oct 15, 1950

## 2019-08-15 ENCOUNTER — Inpatient Hospital Stay: Payer: Medicare Other

## 2019-08-15 ENCOUNTER — Inpatient Hospital Stay (HOSPITAL_BASED_OUTPATIENT_CLINIC_OR_DEPARTMENT_OTHER): Payer: Medicare Other | Admitting: Hematology and Oncology

## 2019-08-15 ENCOUNTER — Other Ambulatory Visit: Payer: Self-pay

## 2019-08-15 DIAGNOSIS — Z170 Estrogen receptor positive status [ER+]: Secondary | ICD-10-CM

## 2019-08-15 DIAGNOSIS — C50211 Malignant neoplasm of upper-inner quadrant of right female breast: Secondary | ICD-10-CM

## 2019-08-15 DIAGNOSIS — Z5112 Encounter for antineoplastic immunotherapy: Secondary | ICD-10-CM | POA: Diagnosis not present

## 2019-08-15 LAB — CMP (CANCER CENTER ONLY)
ALT: 60 U/L — ABNORMAL HIGH (ref 0–44)
AST: 34 U/L (ref 15–41)
Albumin: 3.5 g/dL (ref 3.5–5.0)
Alkaline Phosphatase: 105 U/L (ref 38–126)
Anion gap: 9 (ref 5–15)
BUN: 27 mg/dL — ABNORMAL HIGH (ref 8–23)
CO2: 24 mmol/L (ref 22–32)
Calcium: 9.2 mg/dL (ref 8.9–10.3)
Chloride: 106 mmol/L (ref 98–111)
Creatinine: 0.9 mg/dL (ref 0.44–1.00)
GFR, Est AFR Am: >60 mL/min (ref >60)
GFR, Estimated: >60 mL/min (ref >60)
Glucose, Bld: 145 mg/dL — ABNORMAL HIGH (ref 70–99)
Potassium: 4.1 mmol/L (ref 3.5–5.1)
Sodium: 139 mmol/L (ref 135–145)
Total Bilirubin: 0.6 mg/dL (ref 0.3–1.2)
Total Protein: 6.6 g/dL (ref 6.5–8.1)

## 2019-08-15 LAB — CBC WITH DIFFERENTIAL (CANCER CENTER ONLY)
Abs Immature Granulocytes: 0.02 10*3/uL (ref 0.00–0.07)
Basophils Absolute: 0.1 10*3/uL (ref 0.0–0.1)
Basophils Relative: 1 %
Eosinophils Absolute: 0 10*3/uL (ref 0.0–0.5)
Eosinophils Relative: 1 %
HCT: 27.1 % — ABNORMAL LOW (ref 36.0–46.0)
Hemoglobin: 8.9 g/dL — ABNORMAL LOW (ref 12.0–15.0)
Immature Granulocytes: 0 %
Lymphocytes Relative: 42 %
Lymphs Abs: 1.9 10*3/uL (ref 0.7–4.0)
MCH: 30 pg (ref 26.0–34.0)
MCHC: 32.8 g/dL (ref 30.0–36.0)
MCV: 91.2 fL (ref 80.0–100.0)
Monocytes Absolute: 0.4 10*3/uL (ref 0.1–1.0)
Monocytes Relative: 9 %
Neutro Abs: 2.1 10*3/uL (ref 1.7–7.7)
Neutrophils Relative %: 47 %
Platelet Count: 252 10*3/uL (ref 150–400)
RBC: 2.97 MIL/uL — ABNORMAL LOW (ref 3.87–5.11)
RDW: 15.3 % (ref 11.5–15.5)
WBC Count: 4.6 10*3/uL (ref 4.0–10.5)
nRBC: 0 % (ref 0.0–0.2)

## 2019-08-15 MED ORDER — DIPHENHYDRAMINE HCL 50 MG/ML IJ SOLN
25.0000 mg | Freq: Once | INTRAMUSCULAR | Status: AC
  Administered 2019-08-15: 25 mg via INTRAVENOUS

## 2019-08-15 MED ORDER — ACETAMINOPHEN 325 MG PO TABS
ORAL_TABLET | ORAL | Status: AC
  Filled 2019-08-15: qty 2

## 2019-08-15 MED ORDER — ONDANSETRON HCL 8 MG PO TABS
8.0000 mg | ORAL_TABLET | Freq: Once | ORAL | Status: AC
  Administered 2019-08-15: 8 mg via ORAL

## 2019-08-15 MED ORDER — FAMOTIDINE IN NACL 20-0.9 MG/50ML-% IV SOLN
20.0000 mg | Freq: Once | INTRAVENOUS | Status: AC
  Administered 2019-08-15: 20 mg via INTRAVENOUS

## 2019-08-15 MED ORDER — SODIUM CHLORIDE 0.9 % IV SOLN
80.0000 mg/m2 | Freq: Once | INTRAVENOUS | Status: AC
  Administered 2019-08-15: 174 mg via INTRAVENOUS
  Filled 2019-08-15: qty 29

## 2019-08-15 MED ORDER — TRASTUZUMAB-ANNS CHEMO 150 MG IV SOLR
2.0000 mg/kg | Freq: Once | INTRAVENOUS | Status: AC
  Administered 2019-08-15: 189 mg via INTRAVENOUS
  Filled 2019-08-15: qty 9

## 2019-08-15 MED ORDER — FAMOTIDINE IN NACL 20-0.9 MG/50ML-% IV SOLN
INTRAVENOUS | Status: AC
  Filled 2019-08-15: qty 50

## 2019-08-15 MED ORDER — SODIUM CHLORIDE 0.9% FLUSH
10.0000 mL | INTRAVENOUS | Status: DC | PRN
  Administered 2019-08-15: 10 mL
  Filled 2019-08-15: qty 10

## 2019-08-15 MED ORDER — ONDANSETRON HCL 8 MG PO TABS
ORAL_TABLET | ORAL | Status: AC
  Filled 2019-08-15: qty 1

## 2019-08-15 MED ORDER — SODIUM CHLORIDE 0.9 % IV SOLN
Freq: Once | INTRAVENOUS | Status: AC
  Administered 2019-08-15: 11:00:00 via INTRAVENOUS
  Filled 2019-08-15: qty 250

## 2019-08-15 MED ORDER — ACETAMINOPHEN 325 MG PO TABS
650.0000 mg | ORAL_TABLET | Freq: Once | ORAL | Status: AC
  Administered 2019-08-15: 650 mg via ORAL

## 2019-08-15 MED ORDER — HEPARIN SOD (PORK) LOCK FLUSH 100 UNIT/ML IV SOLN
500.0000 [IU] | Freq: Once | INTRAVENOUS | Status: AC | PRN
  Administered 2019-08-15: 500 [IU]
  Filled 2019-08-15: qty 5

## 2019-08-15 MED ORDER — DIPHENHYDRAMINE HCL 50 MG/ML IJ SOLN
INTRAMUSCULAR | Status: AC
  Filled 2019-08-15: qty 1

## 2019-08-15 NOTE — Patient Instructions (Signed)
Tunneled Central Venous Catheter Flushing Guide  It is important to flush your tunneled central venous catheter each time you use it, both before and after you use it. Flushing your catheter will help prevent it from clogging. What are the risks? Risks may include:  Infection.  Air getting into the catheter and bloodstream. Supplies needed:  A clean pair of gloves.  A disinfecting wipe. Use an alcohol wipe, chlorhexidine wipe, or iodine wipe as told by your health care provider.  A 10 mL syringe that has been prefilled with saline solution.  An empty 10 mL syringe, if a substance called heparin was injected into your catheter. How to flush your catheter When you flush your catheter, make sure you follow any specific instructions from your health care provider or the manufacturer. These are general guidelines. Flushing your catheter before use If there is heparin in your catheter: 1. Wash your hands with soap and water. 2. Put on gloves. 3. Scrub the injection cap for a minimum of 15 seconds with a disinfecting wipe. 4. Unclamp the catheter. 5. Attach the empty syringe to the injection cap. 6. Pull the syringe plunger back and withdraw 10 mL of blood. 7. Place the syringe into an appropriate waste container. 8. Scrub the injection cap for 15 seconds with a disinfecting wipe. 9. Attach the prefilled syringe to the injection cap. 10. Flush the catheter by pushing the plunger forward until all the liquid from the syringe is in the catheter. 11. Remove the syringe from the injection cap. 12. Clamp the catheter. If there is no heparin in your catheter: 1. Wash your hands with soap and water. 2. Put on gloves. 3. Scrub the injection cap for 15 seconds with a disinfecting wipe. 4. Unclamp the catheter. 5. Attach the prefilled syringe to the injection cap. 6. Flush the catheter by pushing the plunger forward until 5 mL of the liquid from the syringe is in the catheter. 7. Pull back on  the syringe until you see blood in the catheter. 8. If you have been asked to collect any blood, follow your health care provider's instructions. Otherwise, flush the catheter with the rest of the solution from the syringe. 9. Remove the syringe from the injection cap. 10. Clamp the catheter.  Flushing your catheter after use 1. Wash your hands with soap and water. 2. Put on gloves. 3. Scrub the injection cap for 15 seconds with a disinfecting wipe. 4. Unclamp the catheter. 5. Attach the prefilled syringe to the injection cap. 6. Flush the catheter by pushing the plunger forward until all of the liquid from the syringe is in the catheter. 7. Remove the syringe from the injection cap. 8. Clamp the catheter. Problems and solutions  If blood cannot be completely cleared from the injection cap, you may need to have the injection cap replaced.  If the catheter is difficult to flush, use the pulsing method. The pulsing method involves pushing only a few milliliters of solution into the catheter at a time and pausing between pushes.  If you do not see blood in the catheter when you pull back on the syringe, change your body position, such as by raising your arms above your head. Take a deep breath and cough. Then, pull back on the syringe. If you still do not see blood, flush the catheter with a small amount of solution. Then, change positions again and take a breath or cough. Pull back on the syringe again. If you still do not see   blood, finish flushing the catheter and contact your health care provider. Do not use your catheter until your health care provider says it is okay. General tips  Have someone help you flush your catheter, if possible.  Do not force fluid through your catheter.  Do not use a syringe that is larger or smaller than 10 mL. Using a smaller syringe can make the catheter burst.  Do not use your catheter without flushing it first if it has heparin in it. Contact a health  care provider if:  You cannot see any blood in the catheter when you flush it before using it.  Your catheter is difficult to flush. Get help right away if:  You cannot flush the catheter.  The catheter leaks when you flush it or when there is fluid in it.  There are cracks or breaks in the catheter. Summary  It is important to flush your tunneled central venous catheter each time you use it, both before and after you use it.  Scrub the injection cap for 15 seconds with a disinfecting wipe before and after you flush it.  When you flush your catheter, make sure you follow any specific instructions from your health care provider or the manufacturer.  Get help right away if you cannot flush the catheter. This information is not intended to replace advice given to you by your health care provider. Make sure you discuss any questions you have with your health care provider. Document Released: 09/10/2011 Document Revised: 12/07/2018 Document Reviewed: 12/07/2018 Elsevier Patient Education  2020 [employer]  

## 2019-08-15 NOTE — Patient Instructions (Signed)
[facility] Discharge Instructions for Patients Receiving Chemotherapy  Today you received the following chemotherapy agents: Trastuzumab, Taxol  To help prevent nausea and vomiting after your treatment, we encourage you to take your nausea medication as directed.   If you develop nausea and vomiting that is not controlled by your nausea medication, call the clinic.   BELOW ARE SYMPTOMS THAT SHOULD BE REPORTED IMMEDIATELY:  *FEVER GREATER THAN 100.5 F  *CHILLS WITH OR WITHOUT FEVER  NAUSEA AND VOMITING THAT IS NOT CONTROLLED WITH YOUR NAUSEA MEDICATION  *UNUSUAL SHORTNESS OF BREATH  *UNUSUAL BRUISING OR BLEEDING  TENDERNESS IN MOUTH AND THROAT WITH OR WITHOUT PRESENCE OF ULCERS  *URINARY PROBLEMS  *BOWEL PROBLEMS  UNUSUAL RASH Items with * indicate a potential emergency and should be followed up as soon as possible.  Feel free to call the clinic should you have any questions or concerns. The clinic phone number is [phone].  Please show the CHEMO ALERT CARD at check-in to the Emergency Department and triage nurse.   

## 2019-08-15 NOTE — Assessment & Plan Note (Signed)
04/19/2019:Screening mammogram detected right breast asymmetry. Diagnostic mammogram and US showed 2 indeterminate right breast masses, 39m at 1 o'clock, 742mat 12 o'clock, with no axillary adenopathy. 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 Stage Ia  05/30/2019: Right [facility]pectomy[facility] with DCIS, grade 3, 0.6cm, clear margins, 3 axillary lymph nodes negative.HER-2 positive (3+), ER+ (80%), PR -, Ki67 40%. Stage Ia  Treatment plan: 1.Adjuvant chemo with Taxol Herceptinplan to start 06/27/2019 2. Adjuvant radiation therapy followed by 3. Adjuvant antiestrogen therapy ------------------------------------------------------------------------------------------------------------------------------------------------------- Current treatment: Cycle8day 1 Taxol Herceptin Labs reviewed Echocardiogram EF 60 to 65%  Chemo toxicities: 1. Chemotherapy-induced anemia: Hemoglobin is 9.8 and will be monitored 2. Herceptin-induced diarrhea: Instructed her on how to take Imodium. 3. Chemo-induced peripheral neuropathy: Grade 1: We will continue to monitor and keep the dosage the same I discussed with her that I anticipate that she will have a dose reduction when she comes back to see me in 2 weeks.  Return to clinicweekly for chemo and every other week for follow-up with me.

## 2019-08-15 NOTE — Progress Notes (Signed)
Nutrition Assessment   Reason for Assessment:   Patient identified on Malnutrition Screening report for weight loss and poor appetite.   Patient with right breast cancer s/p lumpectomy.  Patient has been started on adjuvant chemotherapy of weekly taxol and herceptin.    RD was hoping to meet with patient during infusion today but patient infusion completed before RD could speak with her.    RD called patient and spoke with her over the phone.  Patient reports no appetite.  Reports that she eats a few bites of whatever she is having.  Enjoys drinking milk.  Reports that she had issues with diarrhea but now is constipated.  She is taking something for it.      Nutrition Focused Physical Exam: deferred   Medications: imodium, calcium carbonate, Vit D, levemir, metformin, zofran, compazine, ativan   Labs: glucose 145, BUN 27, creatinine WNL    Anthropometrics:   Height: 67 inches Weight: 209 lb UBW: 213-216 lb BMI: 32  3% weight loss in 2 weeks   Estimated Energy Needs  Kcals: 2000-2375 Protein: 100-119 g Fluid: >2 L   NUTRITION DIAGNOSIS: Inadequate oral intake related to cancer related treatment side effects as evidenced by 3% weight loss in 2 weeks and poor appetite per report   INTERVENTION:  Encouraged small frequent mini meals during treatment including good source of protein (ie grapes and cheese cubes, 1/2 meat sandwich, etc).   Encouraged protein rich foods to include. Provided patient with contact information and patient will reach out to RD if needed in the future   MONITORING, EVALUATION, GOAL: Patient will consume adequate calories and protein to maintain lean muscle mass during treatment   Next Visit: patient to contact  Kathy Reese, [place], Kathy Reese Registered Dietitian [phone] (pager)

## 2019-08-16 ENCOUNTER — Encounter: Payer: Medicare Other | Admitting: Physical Therapy

## 2019-08-17 ENCOUNTER — Ambulatory Visit: Payer: Medicare Other

## 2019-08-17 ENCOUNTER — Other Ambulatory Visit: Payer: Self-pay

## 2019-08-17 DIAGNOSIS — M25611 Stiffness of right shoulder, not elsewhere classified: Secondary | ICD-10-CM

## 2019-08-17 DIAGNOSIS — M79621 Pain in right upper arm: Secondary | ICD-10-CM

## 2019-08-17 DIAGNOSIS — M6281 Muscle weakness (generalized): Secondary | ICD-10-CM

## 2019-08-17 NOTE — Therapy (Signed)
[place], [name], [address] Phone: [phone]   Fax:  [phone]  Physical Therapy Treatment  Patient Details  Name: Kathy Reese MRN: [id number] Date of Birth: 13-Dec-1950 Referring Provider (PT): Barry Dienes   Encounter Date: 08/17/2019  PT End of Session - 08/17/19 1106    Visit Number  3    Number of Visits  9    Date for PT Re-Evaluation  09/08/19    PT Start Time  1016    PT Stop Time  1107    PT Time Calculation (min)  51 min    Activity Tolerance  Patient tolerated treatment well    Behavior During Therapy  [facility] for tasks assessed/performed       Past Medical History:  Diagnosis Date  . Allergic rhinitis, cause unspecified   . Arthritis   . Depression   . Diverticulosis   . History of Bell's palsy   . Hypersomnia   . OSA on CPAP    not using at this time 06/15/19  . Other and unspecified hyperlipidemia   . Personal history of malignant neoplasm of ovary   . Type II or unspecified type diabetes mellitus without mention of complication, not stated as uncontrolled   . Unspecified essential hypertension   . Vertigo     Past Surgical History:  Procedure Laterality Date  . ABDOMINAL ADHESION SURGERY  12/96   exc peritoneal cyst  . BREAST BIOPSY Left 2000   stereotactic, negative  . BREAST LUMPECTOMY WITH RADIOACTIVE SEED AND SENTINEL LYMPH NODE BIOPSY Right 05/30/2019   Procedure: RIGHT BREAST LUMPECTOMY WITH BRACKETED RADIOACTIVE SEEDS  AND SENTINEL LYMPH NODE BIOPSY;  Surgeon: Stark Klein, MD;  Location: [place];  Service: General;  Laterality: Right;  . BREAST REDUCTION SURGERY Bilateral 10/03  . EYE SURGERY Left    cataracts  . LAPAROSCOPIC SALPINGO OOPHERECTOMY Right 12/96  . 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 LAPAROSCOPY ABDOMEN DIAGNOSTIC  2/08   w/LOA, LSO  . PORTACATH PLACEMENT Left 06/16/2019   Procedure:  INSERTION PORT-A-CATH;  Surgeon: Stark Klein, MD;  Location: [place];  Service: General;  Laterality: Left;  . REDUCTION MAMMAPLASTY Bilateral 2004  . TONSILLECTOMY    . TOTAL ABDOMINAL HYSTERECTOMY W/ BILATERAL SALPINGOOPHORECTOMY  5/94   secondary to uterine fibroids  . TOTAL KNEE ARTHROPLASTY Right 05/12/2013   Procedure: TOTAL KNEE ARTHROPLASTY;  Surgeon: Alta Corning, MD;  Location: [place];  Service: Orthopedics;  Laterality: Right;  . TRIGGER FINGER RELEASE  07/06/2012   Procedure: RELEASE TRIGGER FINGER/A-1 PULLEY;  Surgeon: Alta Corning, MD;  Location: [place];  Service: Orthopedics;  Laterality: Left;  left ring finger  . TUBAL LIGATION      There were no vitals filed for this visit.  Subjective Assessment - 08/17/19 1010    Subjective  My hands have been itching so bad. And the swelling in my arm just comes and goes. The cording has improved a good amount. I just feel it the most when I'm going to reach out real high.    Pertinent History  R breast cancer, s/p R lumpectomy and SLNB on 05/30/19, 3 nodes removed all negative, currently undergoing chemo, pt will begin radiation after chemo    Patient Stated Goals  to relieve that pain and get back to normal    Currently in Pain?  No/denies  [facility] PT Assessment - 08/17/19 0001      AROM   Right Shoulder Flexion  138 Degrees   feels pulling into upper arm elbow   Right Shoulder ABduction  115 Degrees   feels pulling into chest and axilla       LYMPHEDEMA/ONCOLOGY QUESTIONNAIRE - 08/17/19 1011      Right Upper Extremity Lymphedema   15 cm Proximal to Olecranon Process  40.2 cm    Olecranon Process  27.2 cm    15 cm Proximal to Ulnar Styloid Process  26.2 cm    Just Proximal to Ulnar Styloid Process  18.7 cm    Across Hand at [employer]  19.4 cm    At [place] of 2nd Digit  6.4 cm                [facility] Adult PT Treatment/Exercise - 08/17/19 0001      Manual Therapy   Manual Therapy   Myofascial release;Manual Lymphatic Drainage (MLD);Passive ROM    Myofascial Release  To Rt axilla during P/ROM at area of cording as well as antecubital fossa    Manual Lymphatic Drainage (MLD)  In Supine: Short neck, superficial and deep abdominals, Rt inguinal and Lt axillary nodes, Rt axillo-inguinal and anterior inter-axillary anastomosis, then Rt UE lateral upper arm to dorsal hand working from proximal to distal then retracing all steps along anastomosis    Passive ROM  In Supine to Rt shoulder into flexion, abduction and D2 to pts tolerance                  PT Long Term Goals - 08/11/19 1148      PT LONG TERM GOAL #1   Title  Pt will demonstrate 155 degrees of R shoulder flexion to allow her to reach overhead.    Baseline  137    Time  4    Period  Weeks    Status  New    Target Date  09/08/19      PT LONG TERM GOAL #2   Title  Pt will demonstrates 160 degrees of R shoulder abduction to allow her to reach out to the side.    Baseline  122    Time  4    Period  Weeks    Status  New    Target Date  09/08/19      PT LONG TERM GOAL #3   Title  Pt will have no palpable cording in R antecubital fossa to allow her to reach overhead without discomfort.    Baseline  numerous cords palpable    Time  4    Period  Weeks    Status  New    Target Date  09/08/19      PT LONG TERM GOAL #4   Title  Pt will be independent in a home exercise program for continued strengthening and stretching.    Time  4    Period  Weeks    Status  New    Target Date  09/08/19      PT LONG TERM GOAL #5   Title  Pt will report a 75% improvement in tightness across R pec muscle to allow pt to move arm without discomfort.    Time  4    Period  Weeks    Status  New    Target Date  09/08/19            Plan - 08/17/19 1233  Clinical Impression Statement  Pt continues to report improvement from therapy as her cording is becoming less restrictive now only noticing it when she reaches  further away from her body (to high shelf or to hang something up. So continued with manual therapy and during myofascial release 1 audible and palpable pop noted by pt and therapist and pt reports Rt shoulder feeling looser by end of session. Cording still palpable at antecubital fossa and axilla, but slightly less so in axilla towards end of session.    Personal Factors and Comorbidities  Transportation;Profession;Comorbidity 1;Comorbidity 2    Comorbidities  diabetes, ovarian cancer, caregiver for husband with dementia, sister brings her to appts    Examination-Activity Limitations  Reach Overhead;Carry;Lift    Examination-Participation Restrictions  Cleaning;Meal Prep;Laundry    Stability/Clinical Decision Making  Evolving/Moderate complexity    Rehab Potential  Excellent    PT Frequency  2x / week    PT Duration  4 weeks    PT Treatment/Interventions  ADLs/Self Care Home Management;Therapeutic activities;Therapeutic exercise;Patient/family education;Manual lymph drainage;Manual techniques;Compression bandaging;Scar mobilization;Passive range of motion;Taping;Joint Manipulations    PT Next Visit Plan  Cont STM and myofascial to cording in R antecubital fossa, AAROM exercises and PROM to R shoulder, R pec stretches, scar mobilization    PT Home Exercise Plan  post op breast, supine dowel exercises    Consulted and Agree with Plan of Care  Patient       Patient will benefit from skilled therapeutic intervention in order to improve the following deficits and impairments:  Decreased range of motion, Decreased scar mobility, Increased edema, Decreased knowledge of precautions, Decreased strength, Increased fascial restricitons, Impaired UE functional use, Postural dysfunction, Pain  Visit Diagnosis: Stiffness of right shoulder, not elsewhere classified  Pain in right upper arm  Muscle weakness (generalized)     Problem List Patient Active Problem List   Diagnosis Date Noted  .  Port-A-Cath in place 07/11/2019  . Malignant neoplasm of upper-inner quadrant of right breast in female, estrogen receptor positive ([place]) 04/19/2019  . Facial weakness 11/07/2018  . 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 History of hysterectomy 10/12/2018  . History of bilateral salpingo-oophorectomy (BSO) 10/12/2018  . Osteoarthritis of right knee 05/12/2013  . HYPERLIPIDEMIA 12/30/2007  . HYPERTENSION 12/30/2007  . INSOMNIA 12/30/2007  . HYPERSOMNIA 12/30/2007  . DM w/o Complication Type II [id number]  . HYPERCHOLESTEROLEMIA 12/16/2007  . EXOGENOUS OBESITY 12/16/2007  . SLEEP APNEA, OBSTRUCTIVE 12/16/2007  . ALLERGIC RHINITIS 12/16/2007    Otelia Limes, PTA 08/17/2019, 12:36 PM  [address] [place], [name], [address] Phone: [phone]   Fax:  [phone]  Name: Kathy Reese MRN: [id number] Date of Birth: 09/21/1951

## 2019-08-21 ENCOUNTER — Ambulatory Visit: Payer: Medicare Other | Admitting: Physical Therapy

## 2019-08-21 ENCOUNTER — Other Ambulatory Visit: Payer: Self-pay

## 2019-08-21 ENCOUNTER — Encounter: Payer: Self-pay | Admitting: Physical Therapy

## 2019-08-21 DIAGNOSIS — M25611 Stiffness of right shoulder, not elsewhere classified: Secondary | ICD-10-CM | POA: Diagnosis not present

## 2019-08-21 DIAGNOSIS — M79621 Pain in right upper arm: Secondary | ICD-10-CM

## 2019-08-21 DIAGNOSIS — M6281 Muscle weakness (generalized): Secondary | ICD-10-CM

## 2019-08-21 NOTE — Therapy (Signed)
[place], [name], [address] Phone: [phone]   Fax:  [phone]  Physical Therapy Treatment  Patient Details  Name: Kathy Reese MRN: [id number] Date of Birth: 08-20-1951 Referring Provider (PT): Barry Dienes   Encounter Date: 08/21/2019  PT End of Session - 08/21/19 [id number]    Visit Number  4    Number of Visits  9    Date for PT Re-Evaluation  09/08/19    PT Start Time  1103    PT Stop Time  1145    PT Time Calculation (min)  42 min    Activity Tolerance  Patient tolerated treatment well    Behavior During Therapy  [facility] for tasks assessed/performed       Past Medical History:  Diagnosis Date  . Allergic rhinitis, cause unspecified   . Arthritis   . Depression   . Diverticulosis   . History of Bell's palsy   . Hypersomnia   . OSA on CPAP    not using at this time 06/15/19  . Other and unspecified hyperlipidemia   . Personal history of malignant neoplasm of ovary   . Type II or unspecified type diabetes mellitus without mention of complication, not stated as uncontrolled   . Unspecified essential hypertension   . Vertigo     Past Surgical History:  Procedure Laterality Date  . ABDOMINAL ADHESION SURGERY  12/96   exc peritoneal cyst  . BREAST BIOPSY Left 2000   stereotactic, negative  . BREAST LUMPECTOMY WITH RADIOACTIVE SEED AND SENTINEL LYMPH NODE BIOPSY Right 05/30/2019   Procedure: RIGHT BREAST LUMPECTOMY WITH BRACKETED RADIOACTIVE SEEDS  AND SENTINEL LYMPH NODE BIOPSY;  Surgeon: Stark Klein, MD;  Location: [place];  Service: General;  Laterality: Right;  . BREAST REDUCTION SURGERY Bilateral 10/03  . EYE SURGERY Left    cataracts  . LAPAROSCOPIC SALPINGO OOPHERECTOMY Right 12/96  . 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 LAPAROSCOPY ABDOMEN DIAGNOSTIC  2/08   w/LOA, LSO  . PORTACATH PLACEMENT Left 06/16/2019   Procedure:  INSERTION PORT-A-CATH;  Surgeon: Stark Klein, MD;  Location: [place];  Service: General;  Laterality: Left;  . REDUCTION MAMMAPLASTY Bilateral 2004  . TONSILLECTOMY    . TOTAL ABDOMINAL HYSTERECTOMY W/ BILATERAL SALPINGOOPHORECTOMY  5/94   secondary to uterine fibroids  . TOTAL KNEE ARTHROPLASTY Right 05/12/2013   Procedure: TOTAL KNEE ARTHROPLASTY;  Surgeon: Alta Corning, MD;  Location: [place];  Service: Orthopedics;  Laterality: Right;  . TRIGGER FINGER RELEASE  07/06/2012   Procedure: RELEASE TRIGGER FINGER/A-1 PULLEY;  Surgeon: Alta Corning, MD;  Location: [place];  Service: Orthopedics;  Laterality: Left;  left ring finger  . TUBAL LIGATION      There were no vitals filed for this visit.  Subjective Assessment - 08/21/19 1233    Subjective  pt states she had some weakness in her left hand when she tried to pull up her zipper on her bra.  She has not had any other symptoms of slurred speech or weakness elsewhere. She will let her doctor know at her next visit. Guitar string cord is palpable at anterior lateral right  elbow    Pertinent History  R breast cancer, s/p R lumpectomy and SLNB on 05/30/19, 3 nodes removed all negative, currently undergoing chemo, pt will begin radiation after chemo    Patient Stated Goals  to relieve that pain and  get back to normal    Currently in Pain?  No/denies                       [facility] Adult PT Treatment/Exercise - 08/21/19 0001      Manual Therapy   Manual Therapy  Edema management;Soft tissue mobilization;Manual Lymphatic Drainage (MLD);Passive ROM    Edema Management  Applied medium Tg soft to right arm with deep fold at the top to support right arm lymphatics.  Discussed with pt about compression sleeve and glove/gauntlet and compression bra and she wants to be measured for those     Soft tissue mobilization  soft tissue mobilazation around cord to loosen adhesions     Manual Lymphatic Drainage (MLD)  In Supine: Short  neck, superficial and deep abdominals, Rt inguinal and Lt axillary nodes, Rt axillo-inguinal and anterior inter-axillary anastomosis, then Rt UE lateral upper arm to dorsal hand working from proximal to distal then retracing all steps along anastomosis    Passive ROM  In Supine to Rt shoulder into flexion, abduction and D2 to pts tolerance                  PT Long Term Goals - 08/11/19 1148      PT LONG TERM GOAL #1   Title  Pt will demonstrate 155 degrees of R shoulder flexion to allow her to reach overhead.    Baseline  137    Time  4    Period  Weeks    Status  New    Target Date  09/08/19      PT LONG TERM GOAL #2   Title  Pt will demonstrates 160 degrees of R shoulder abduction to allow her to reach out to the side.    Baseline  122    Time  4    Period  Weeks    Status  New    Target Date  09/08/19      PT LONG TERM GOAL #3   Title  Pt will have no palpable cording in R antecubital fossa to allow her to reach overhead without discomfort.    Baseline  numerous cords palpable    Time  4    Period  Weeks    Status  New    Target Date  09/08/19      PT LONG TERM GOAL #4   Title  Pt will be independent in a home exercise program for continued strengthening and stretching.    Time  4    Period  Weeks    Status  New    Target Date  09/08/19      PT LONG TERM GOAL #5   Title  Pt will report a 75% improvement in tightness across R pec muscle to allow pt to move arm without discomfort.    Time  4    Period  Weeks    Status  New    Target Date  09/08/19            Plan - 08/21/19 1241    Clinical Impression Statement  Pt presents with swelling in her hand today and guitar string cord is palpable in antecubitual fossa.  She says her swelling comes and goes, Tried Tg soft for compression today and pt would like to get measured for compression sleve and glove/gauntlet ( she thinks her fingers swell some and so likley will need a glove )    Comorbidities  diabetes, ovarian cancer, caregiver for husband with dementia, sister brings her to appts    Rehab Potential  Excellent    PT Frequency  2x / week    PT Duration  4 weeks    PT Treatment/Interventions  ADLs/Self Care Home Management;Therapeutic activities;Therapeutic exercise;Patient/family education;Manual lymph drainage;Manual techniques;Compression bandaging;Scar mobilization;Passive range of motion;Taping;Joint Manipulations    PT Next Visit Plan  check to see how tg helped. Cont STM and myofascial to cording in R antecubital fossa, AAROM exercises and PROM to R shoulder, R pec stretches, scar mobilization    Consulted and Agree with Plan of Care  Patient       Patient will benefit from skilled therapeutic intervention in order to improve the following deficits and impairments:  Decreased range of motion, Decreased scar mobility, Increased edema, Decreased knowledge of precautions, Decreased strength, Increased fascial restricitons, Impaired UE functional use, Postural dysfunction, Pain  Visit Diagnosis: Stiffness of right shoulder, not elsewhere classified  Pain in right upper arm  Muscle weakness (generalized)     Problem List Patient Active Problem List   Diagnosis Date Noted  . Port-A-Cath in place 07/11/2019  . Malignant neoplasm of upper-inner quadrant of right breast in female, estrogen receptor positive ([place]) 04/19/2019  . Facial weakness 11/07/2018  . 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 History of hysterectomy 10/12/2018  . History of bilateral salpingo-oophorectomy (BSO) 10/12/2018  . Osteoarthritis of right knee 05/12/2013  . HYPERLIPIDEMIA 12/30/2007  . HYPERTENSION 12/30/2007  . INSOMNIA 12/30/2007  . HYPERSOMNIA 12/30/2007  . DM w/o Complication Type II [id number]  . HYPERCHOLESTEROLEMIA 12/16/2007  . EXOGENOUS OBESITY 12/16/2007  . SLEEP APNEA, OBSTRUCTIVE 12/16/2007  . ALLERGIC RHINITIS 12/16/2007   Donato Heinz. Owens Shark  PT  Norwood Levo 08/21/2019, 12:54 PM  [place], [name], [address] Phone: [phone]   Fax:  [phone]  Name: Kathy Reese MRN: [id number] Date of Birth: [birth year]

## 2019-08-22 ENCOUNTER — Inpatient Hospital Stay: Payer: Medicare Other

## 2019-08-22 ENCOUNTER — Other Ambulatory Visit: Payer: Self-pay | Admitting: Hematology and Oncology

## 2019-08-22 ENCOUNTER — Other Ambulatory Visit: Payer: Self-pay

## 2019-08-22 VITALS — BP 151/71 | HR 70 | Temp 98.0°F | Resp 18 | Wt 206.2 lb

## 2019-08-22 DIAGNOSIS — Z170 Estrogen receptor positive status [ER+]: Secondary | ICD-10-CM

## 2019-08-22 DIAGNOSIS — C50211 Malignant neoplasm of upper-inner quadrant of right female breast: Secondary | ICD-10-CM

## 2019-08-22 DIAGNOSIS — Z5112 Encounter for antineoplastic immunotherapy: Secondary | ICD-10-CM | POA: Diagnosis not present

## 2019-08-22 DIAGNOSIS — Z95828 Presence of other vascular implants and grafts: Secondary | ICD-10-CM

## 2019-08-22 LAB — CBC WITH DIFFERENTIAL (CANCER CENTER ONLY)
Abs Immature Granulocytes: 0.04 10*3/uL (ref 0.00–0.07)
Basophils Absolute: 0 10*3/uL (ref 0.0–0.1)
Basophils Relative: 1 %
Eosinophils Absolute: 0.1 10*3/uL (ref 0.0–0.5)
Eosinophils Relative: 1 %
HCT: 28.3 % — ABNORMAL LOW (ref 36.0–46.0)
Hemoglobin: 9.5 g/dL — ABNORMAL LOW (ref 12.0–15.0)
Immature Granulocytes: 1 %
Lymphocytes Relative: 39 %
Lymphs Abs: 1.9 10*3/uL (ref 0.7–4.0)
MCH: 30.6 pg (ref 26.0–34.0)
MCHC: 33.6 g/dL (ref 30.0–36.0)
MCV: 91.3 fL (ref 80.0–100.0)
Monocytes Absolute: 0.5 10*3/uL (ref 0.1–1.0)
Monocytes Relative: 10 %
Neutro Abs: 2.4 10*3/uL (ref 1.7–7.7)
Neutrophils Relative %: 48 %
Platelet Count: 293 10*3/uL (ref 150–400)
RBC: 3.1 MIL/uL — ABNORMAL LOW (ref 3.87–5.11)
RDW: 15.9 % — ABNORMAL HIGH (ref 11.5–15.5)
WBC Count: 4.9 10*3/uL (ref 4.0–10.5)
nRBC: 0.4 % — ABNORMAL HIGH (ref 0.0–0.2)

## 2019-08-22 LAB — CMP (CANCER CENTER ONLY)
ALT: 41 U/L (ref 0–44)
AST: 29 U/L (ref 15–41)
Albumin: 3.6 g/dL (ref 3.5–5.0)
Alkaline Phosphatase: 112 U/L (ref 38–126)
Anion gap: 9 (ref 5–15)
BUN: 25 mg/dL — ABNORMAL HIGH (ref 8–23)
CO2: 24 mmol/L (ref 22–32)
Calcium: 9.2 mg/dL (ref 8.9–10.3)
Chloride: 103 mmol/L (ref 98–111)
Creatinine: 1.09 mg/dL — ABNORMAL HIGH (ref 0.44–1.00)
GFR, Est AFR Am: >60 mL/min (ref >60)
GFR, Estimated: 52 mL/min — ABNORMAL LOW (ref >60)
Glucose, Bld: 201 mg/dL — ABNORMAL HIGH (ref 70–99)
Potassium: 4.3 mmol/L (ref 3.5–5.1)
Sodium: 136 mmol/L (ref 135–145)
Total Bilirubin: 0.7 mg/dL (ref 0.3–1.2)
Total Protein: 7 g/dL (ref 6.5–8.1)

## 2019-08-22 MED ORDER — TRASTUZUMAB-ANNS CHEMO 150 MG IV SOLR
2.0000 mg/kg | Freq: Once | INTRAVENOUS | Status: AC
  Administered 2019-08-22: 189 mg via INTRAVENOUS
  Filled 2019-08-22: qty 9

## 2019-08-22 MED ORDER — ONDANSETRON HCL 8 MG PO TABS
ORAL_TABLET | ORAL | Status: AC
  Filled 2019-08-22: qty 1

## 2019-08-22 MED ORDER — SODIUM CHLORIDE 0.9 % IV SOLN
Freq: Once | INTRAVENOUS | Status: AC
  Administered 2019-08-22: 14:00:00 via INTRAVENOUS
  Filled 2019-08-22: qty 250

## 2019-08-22 MED ORDER — DIPHENHYDRAMINE HCL 50 MG/ML IJ SOLN
25.0000 mg | Freq: Once | INTRAMUSCULAR | Status: AC
  Administered 2019-08-22: 25 mg via INTRAVENOUS

## 2019-08-22 MED ORDER — FAMOTIDINE IN NACL 20-0.9 MG/50ML-% IV SOLN
20.0000 mg | Freq: Once | INTRAVENOUS | Status: AC
  Administered 2019-08-22: 20 mg via INTRAVENOUS

## 2019-08-22 MED ORDER — ONDANSETRON HCL 8 MG PO TABS
8.0000 mg | ORAL_TABLET | Freq: Once | ORAL | Status: AC
  Administered 2019-08-22: 8 mg via ORAL

## 2019-08-22 MED ORDER — DIPHENHYDRAMINE HCL 50 MG/ML IJ SOLN
INTRAMUSCULAR | Status: AC
  Filled 2019-08-22: qty 1

## 2019-08-22 MED ORDER — HEPARIN SOD (PORK) LOCK FLUSH 100 UNIT/ML IV SOLN
500.0000 [IU] | Freq: Once | INTRAVENOUS | Status: AC | PRN
  Administered 2019-08-22: 17:00:00 500 [IU]
  Filled 2019-08-22: qty 5

## 2019-08-22 MED ORDER — ACETAMINOPHEN 325 MG PO TABS
ORAL_TABLET | ORAL | Status: AC
  Filled 2019-08-22: qty 2

## 2019-08-22 MED ORDER — SODIUM CHLORIDE 0.9 % IV SOLN
65.0000 mg/m2 | Freq: Once | INTRAVENOUS | Status: AC
  Administered 2019-08-22: 138 mg via INTRAVENOUS
  Filled 2019-08-22: qty 23

## 2019-08-22 MED ORDER — FAMOTIDINE IN NACL 20-0.9 MG/50ML-% IV SOLN
INTRAVENOUS | Status: AC
  Filled 2019-08-22: qty 50

## 2019-08-22 MED ORDER — SODIUM CHLORIDE 0.9% FLUSH
10.0000 mL | INTRAVENOUS | Status: DC | PRN
  Administered 2019-08-22: 10 mL
  Filled 2019-08-22: qty 10

## 2019-08-22 MED ORDER — ACETAMINOPHEN 325 MG PO TABS
650.0000 mg | ORAL_TABLET | Freq: Once | ORAL | Status: AC
  Administered 2019-08-22: 650 mg via ORAL

## 2019-08-22 MED ORDER — SODIUM CHLORIDE 0.9% FLUSH
10.0000 mL | Freq: Once | INTRAVENOUS | Status: AC
  Administered 2019-08-22: 13:00:00 10 mL
  Filled 2019-08-22: qty 10

## 2019-08-22 NOTE — Progress Notes (Signed)
Due to developing neuropathy, we reduce the dosage of chemotherapy today.

## 2019-08-22 NOTE — Progress Notes (Signed)
Pt reports that her peripheral neuropathy is worsening in her hands only, is still able to do her usual daily activities.  Neuropathy is constant, Dr. Lindi Adie informed, is reducing taxol dose.

## 2019-08-22 NOTE — Patient Instructions (Signed)
[facility] Discharge Instructions for Patients Receiving Chemotherapy  Today you received the following chemotherapy agents: Trastuzumab, Taxol  To help prevent nausea and vomiting after your treatment, we encourage you to take your nausea medication as directed.   If you develop nausea and vomiting that is not controlled by your nausea medication, call the clinic.   BELOW ARE SYMPTOMS THAT SHOULD BE REPORTED IMMEDIATELY:  *FEVER GREATER THAN 100.5 F  *CHILLS WITH OR WITHOUT FEVER  NAUSEA AND VOMITING THAT IS NOT CONTROLLED WITH YOUR NAUSEA MEDICATION  *UNUSUAL SHORTNESS OF BREATH  *UNUSUAL BRUISING OR BLEEDING  TENDERNESS IN MOUTH AND THROAT WITH OR WITHOUT PRESENCE OF ULCERS  *URINARY PROBLEMS  *BOWEL PROBLEMS  UNUSUAL RASH Items with * indicate a potential emergency and should be followed up as soon as possible.  Feel free to call the clinic should you have any questions or concerns. The clinic phone number is [phone].  Please show the CHEMO ALERT CARD at check-in to the Emergency Department and triage nurse.   

## 2019-08-25 ENCOUNTER — Encounter: Payer: Self-pay | Admitting: Physical Therapy

## 2019-08-25 ENCOUNTER — Other Ambulatory Visit: Payer: Self-pay

## 2019-08-25 ENCOUNTER — Ambulatory Visit: Payer: Medicare Other | Admitting: Physical Therapy

## 2019-08-25 DIAGNOSIS — M79621 Pain in right upper arm: Secondary | ICD-10-CM

## 2019-08-25 DIAGNOSIS — M25611 Stiffness of right shoulder, not elsewhere classified: Secondary | ICD-10-CM

## 2019-08-25 DIAGNOSIS — M6281 Muscle weakness (generalized): Secondary | ICD-10-CM

## 2019-08-25 NOTE — Therapy (Signed)
Kathy Reese, [name], [address] Phone: [phone]   Fax:  [phone]  Physical Therapy Treatment  Patient Details  Name: Kathy Reese MRN: [id number] Date of Birth: [birth year] Referring Provider (PT): Kathy Reese   Encounter Date: 08/25/2019  PT End of Session - 08/25/19 0957    Visit Number  5    Number of Visits  9    Date for PT Re-Evaluation  09/08/19    PT Start Time  0902    PT Stop Time  0953    PT Time Calculation (min)  51 min    Activity Tolerance  Patient tolerated treatment well    Behavior During Therapy  [facility] for tasks assessed/performed       Past Medical History:  Diagnosis Date  . Allergic rhinitis, cause unspecified   . Arthritis   . Depression   . Diverticulosis   . History of Bell's palsy   . Hypersomnia   . OSA on CPAP    not using at this time 06/15/19  . Other and unspecified hyperlipidemia   . Personal history of malignant neoplasm of ovary   . Type II or unspecified type diabetes mellitus without mention of complication, not stated as uncontrolled   . Unspecified essential hypertension   . Vertigo     Past Surgical History:  Procedure Laterality Date  . ABDOMINAL ADHESION SURGERY  12/96   exc peritoneal cyst  . BREAST BIOPSY Left 2000   stereotactic, negative  . BREAST LUMPECTOMY WITH RADIOACTIVE SEED AND SENTINEL LYMPH NODE BIOPSY Right 05/30/2019   Procedure: RIGHT BREAST LUMPECTOMY WITH BRACKETED RADIOACTIVE SEEDS  AND SENTINEL LYMPH NODE BIOPSY;  Surgeon: Kathy Klein, MD;  Location: [place];  Service: General;  Laterality: Right;  . BREAST REDUCTION SURGERY Bilateral 10/03  . EYE SURGERY Left    cataracts  . LAPAROSCOPIC SALPINGO OOPHERECTOMY Right 12/96  . 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 LAPAROSCOPY ABDOMEN DIAGNOSTIC  2/08   w/LOA, LSO  . PORTACATH PLACEMENT Left 06/16/2019   Procedure:  INSERTION PORT-A-CATH;  Surgeon: Kathy Klein, MD;  Location: [place];  Service: General;  Laterality: Left;  . REDUCTION MAMMAPLASTY Bilateral 2004  . TONSILLECTOMY    . TOTAL ABDOMINAL HYSTERECTOMY W/ BILATERAL SALPINGOOPHORECTOMY  5/94   secondary to uterine fibroids  . TOTAL KNEE ARTHROPLASTY Right 05/12/2013   Procedure: TOTAL KNEE ARTHROPLASTY;  Surgeon: Kathy Corning, MD;  Location: [place];  Service: Orthopedics;  Laterality: Right;  . TRIGGER FINGER RELEASE  07/06/2012   Procedure: RELEASE TRIGGER FINGER/A-1 PULLEY;  Surgeon: Kathy Corning, MD;  Location: [place];  Service: Orthopedics;  Laterality: Left;  left ring finger  . TUBAL LIGATION      There were no vitals filed for this visit.  Subjective Assessment - 08/25/19 0901    Subjective  The TG soft helped a lot.    Pertinent History  R breast cancer, s/p R lumpectomy and SLNB on 05/30/19, 3 nodes removed all negative, currently undergoing chemo, pt will begin radiation after chemo    Patient Stated Goals  to relieve that pain and get back to normal    Currently in Pain?  No/denies    Pain Score  0-No pain                       [facility] Adult PT Treatment/Exercise - 08/25/19 0001  Manual Therapy   Edema Management  issued signed script to pt for compression garments and she is bringing it in on Mon when she gets measured    Myofascial Release  along cording in RUE especially around antecubital fossa    Manual Lymphatic Drainage (MLD)  In Supine: Short neck, superficial and deep abdominals, Rt inguinal and Lt axillary nodes, Rt axillo-inguinal and anterior inter-axillary anastomosis, then Rt UE lateral upper arm to dorsal hand working from proximal to distal then retracing all steps along anastomosis    Passive ROM  In Supine to Rt shoulder into flexion and abduction to pts tolerance                  PT Long Term Goals - 08/11/19 1148      PT LONG TERM GOAL #1   Title  Pt will  demonstrate 155 degrees of R shoulder flexion to allow her to reach overhead.    Baseline  137    Time  4    Period  Weeks    Status  New    Target Date  09/08/19      PT LONG TERM GOAL #2   Title  Pt will demonstrates 160 degrees of R shoulder abduction to allow her to reach out to the side.    Baseline  122    Time  4    Period  Weeks    Status  New    Target Date  09/08/19      PT LONG TERM GOAL #3   Title  Pt will have no palpable cording in R antecubital fossa to allow her to reach overhead without discomfort.    Baseline  numerous cords palpable    Time  4    Period  Weeks    Status  New    Target Date  09/08/19      PT LONG TERM GOAL #4   Title  Pt will be independent in a home exercise program for continued strengthening and stretching.    Time  4    Period  Weeks    Status  New    Target Date  09/08/19      PT LONG TERM GOAL #5   Title  Pt will report a 75% improvement in tightness across R pec muscle to allow pt to move arm without discomfort.    Time  4    Period  Weeks    Status  New    Target Date  09/08/19            Plan - 08/25/19 [id number]    Clinical Impression Statement  Pt is no longer presenting with hand swelling but she is still having cording especially at antecubital fossa. Performed myofascial to this area to help decrease cording. Pt reports the TG soft issued at last session really helped her. She is getting measured for compression garments on Monday.    PT Frequency  2x / week    PT Duration  4 weeks    PT Treatment/Interventions  ADLs/Self Care Home Management;Therapeutic activities;Therapeutic exercise;Patient/family education;Manual lymph drainage;Manual techniques;Compression bandaging;Scar mobilization;Passive range of motion;Taping;Joint Manipulations    PT Next Visit Plan  Cont STM and myofascial to cording in R antecubital fossa, AAROM exercises and PROM to R shoulder, R pec stretches, scar mobilization    PT Home Exercise Plan  post  op breast, supine dowel exercises    Consulted and Agree with Plan of Care  Patient  Patient will benefit from skilled therapeutic intervention in order to improve the following deficits and impairments:  Decreased range of motion, Decreased scar mobility, Increased edema, Decreased knowledge of precautions, Decreased strength, Increased fascial restricitons, Impaired UE functional use, Postural dysfunction, Pain  Visit Diagnosis: Stiffness of right shoulder, not elsewhere classified  Pain in right upper arm  Muscle weakness (generalized)     Problem List Patient Active Problem List   Diagnosis Date Noted  . Port-A-Cath in place 07/11/2019  . Malignant neoplasm of upper-inner quadrant of right breast in female, estrogen receptor positive ([place]) 04/19/2019  . Facial weakness 11/07/2018  . 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 History of hysterectomy 10/12/2018  . History of bilateral salpingo-oophorectomy (BSO) 10/12/2018  . Osteoarthritis of right knee 05/12/2013  . HYPERLIPIDEMIA 12/30/2007  . HYPERTENSION 12/30/2007  . INSOMNIA 12/30/2007  . HYPERSOMNIA 12/30/2007  . DM w/o Complication Type II [id number]  . HYPERCHOLESTEROLEMIA 12/16/2007  . EXOGENOUS OBESITY 12/16/2007  . SLEEP APNEA, OBSTRUCTIVE 12/16/2007  . ALLERGIC RHINITIS 12/16/2007    [facility] 08/25/2019, 10:00 AM  [place], [name], [address] Phone: [phone]   Fax:  [phone]  Name: Victoriah Gubser MRN: [id number] Date of Birth: [birth year]  Manus Gunning, PT 08/25/19 10:00 AM

## 2019-08-28 ENCOUNTER — Ambulatory Visit: Payer: Medicare Other

## 2019-08-28 ENCOUNTER — Other Ambulatory Visit: Payer: Self-pay

## 2019-08-28 DIAGNOSIS — M25611 Stiffness of right shoulder, not elsewhere classified: Secondary | ICD-10-CM | POA: Diagnosis not present

## 2019-08-28 DIAGNOSIS — M6281 Muscle weakness (generalized): Secondary | ICD-10-CM

## 2019-08-28 DIAGNOSIS — M79621 Pain in right upper arm: Secondary | ICD-10-CM

## 2019-08-28 NOTE — Progress Notes (Signed)
Patient Care Team: Nolene Ebbs, MD as PCP - General (Internal Medicine) Mauro Kaufmann, RN as Oncology Nurse Navigator Rockwell Germany, RN as Oncology Nurse Navigator  DIAGNOSIS:    ICD-10-CM   1. Malignant neoplasm of upper-inner quadrant of right breast in female, estrogen receptor positive ([place])  C50.211    Z17.0     SUMMARY OF ONCOLOGIC HISTORY: Oncology History  Malignant neoplasm of upper-inner quadrant of right breast in female, estrogen receptor positive ([place])  04/12/2019 Cancer Staging   Staging form: Breast, AJCC 8th Edition - Clinical stage from 04/12/2019: Stage IA (cT1b, cN0, cM0, G3, ER+, PR-, HER2+) - Signed by Gardenia Phlegm, NP on 04/19/2019   04/19/2019 Initial Diagnosis   Screening mammogram detected right breast asymmetry. Diagnostic mammogram and US showed 2 indeterminate right breast masses, 71m at 1 o'clock, 726mat 12 o'clock, with no axillary adenopathy. 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    05/30/2019 Surgery   Right lumpectomy ([facility] IDC with DCIS, grade 3, 0.6cm, clear margins, 3 axillary lymph nodes negative.    06/06/2019 Cancer Staging   Staging form: Breast, AJCC 8th Edition - Pathologic: Stage IA (pT1b, pN0, cM0, G3, ER+, PR-, HER2+) - Signed by GuNicholas LoseMD on 06/06/2019   06/27/2019 -  Chemotherapy   The patient had PACLitaxel (TAXOL) 174 mg in sodium chloride 0.9 % 250 mL chemo infusion (</= 8078m2), 80 mg/m2 = 174 mg, Intravenous,  Once, 3 of 3 cycles Dose modification: 65 mg/m2 (original dose 80 mg/m2, Cycle 3, Reason: Dose not tolerated) Administration: 174 mg (06/27/2019), 174 mg (07/04/2019), 174 mg (07/25/2019), 174 mg (07/11/2019), 174 mg (07/18/2019), 174 mg (08/01/2019), 174 mg (08/08/2019), 174 mg (08/15/2019), 138 mg (08/22/2019) trastuzumab-anns (KANJINTI) 399 mg in sodium chloride 0.9 % 250 mL chemo infusion, 4 mg/kg = 399 mg (100 % of original dose 4 mg/kg), Intravenous,  Once, 3  of 16 cycles Dose modification: 4 mg/kg (original dose 4 mg/kg, Cycle 1, Reason: Other (see comments), Comment: Biosimilar Conversion; pref [facility] mg/kg (original dose 2 mg/kg, Cycle 3, Reason: Other (see comments), Comment: switch to maintenance q3 weeks), 600 mg (original dose 2 mg/kg, Cycle 3, Reason: Other (see comments), Comment: maint q3 weeks) Administration: 399 mg (06/27/2019), 189 mg (07/04/2019), 189 mg (07/11/2019), 189 mg (07/18/2019), 189 mg (07/25/2019), 189 mg (08/01/2019), 189 mg (08/08/2019), 189 mg (08/15/2019), 189 mg (08/22/2019)  for chemotherapy treatment.      CHIEF COMPLIANT: Cycle10Taxol Herceptin  INTERVAL HISTORY: Kathy Reese a 67 2o. with above-mentioned history of right breast cancerwho underwent a lumpectomy. She is currently on adjuvant chemotherapy with weekly Taxol and Herceptin.She presents to the clinic todayforcycle10.   REVIEW OF SYSTEMS:   Constitutional: Denies fevers, chills or abnormal weight loss Eyes: Denies blurriness of vision Ears, nose, mouth, throat, and face: Denies mucositis or sore throat Respiratory: Denies cough, dyspnea or wheezes Cardiovascular: Denies palpitation, chest discomfort Gastrointestinal: Denies nausea, heartburn or change in bowel habits Skin: Denies abnormal skin rashes Lymphatics: Denies new lymphadenopathy or easy bruising Neurological: Denies numbness, tingling or new weaknesses Behavioral/Psych: Mood is stable, no new changes  Extremities: No lower extremity edema Breast: denies any pain or lumps or nodules in either breasts All other systems were reviewed with the patient and are negative.  I have reviewed the past medical history, past surgical history, social history and family history with the patient and they are unchanged from previous note.  ALLERGIES:  has No Known Allergies.  MEDICATIONS:  Current Outpatient Medications  Medication Sig Dispense Refill   aspirin EC 81 MG tablet Take  81 mg by mouth daily.     atorvastatin (LIPITOR) 40 MG tablet Take 40 mg by mouth daily at 6 PM.      Calcium Carb-Cholecalciferol (CALCIUM 600+D3 PO) Take 1 tablet by mouth 2 (two) times a day.     cholecalciferol (VITAMIN D3) 25 MCG (1000 UT) tablet Take 1,000 Units by mouth daily.     Cinnamon 500 MG capsule Take 500 mg by mouth daily.      citalopram (CELEXA) 10 MG tablet Take 10 mg by mouth daily.     insulin detemir (LEVEMIR) 100 UNIT/ML injection Inject 12-15 Units into the skin See admin instructions. Inject 15 units subcutaneously in the morning & 12 units subcutaneously at night.     lidocaine-prilocaine (EMLA) cream Apply to affected area once 30 g 3   LORazepam (ATIVAN) 0.5 MG tablet Take 1 tablet (0.5 mg total) by mouth at bedtime as needed for sleep. 30 tablet 0   losartan-hydrochlorothiazide (HYZAAR) 100-25 MG tablet Take 1 tablet by mouth daily.  4   meloxicam (MOBIC) 15 MG tablet Take 15 mg by mouth daily as needed for pain.      metFORMIN (GLUCOPHAGE) 500 MG tablet Take 500 mg by mouth every morning.      metoprolol (TOPROL-XL) 50 MG 24 hr tablet Take 50 mg by mouth daily.       ondansetron (ZOFRAN) 8 MG tablet Take 1 tablet (8 mg total) by mouth 2 (two) times daily as needed (Nausea or vomiting). 30 tablet 1   oxyCODONE (OXY IR/ROXICODONE) 5 MG immediate release tablet Take 1 tablet (5 mg total) by mouth every 6 (six) hours as needed for severe pain. (Patient not taking: Reported on 06/14/2019) 20 tablet 0   prochlorperazine (COMPAZINE) 10 MG tablet Take 1 tablet (10 mg total) by mouth every 6 (six) hours as needed (Nausea or vomiting). 30 tablet 1   No current facility-administered medications for this visit.     PHYSICAL EXAMINATION: ECOG PERFORMANCE STATUS: 1 - Symptomatic but completely ambulatory  Vitals:   08/29/19 1039  BP: (!) 151/63  Pulse: (!) 102  Resp: 18  Temp: 97.8 F (36.6 C)  SpO2: 100%   Filed Weights   08/29/19 1039  Weight: 205 lb  14.4 oz (93.4 kg)    GENERAL: alert, no distress and comfortable SKIN: skin color, texture, turgor are normal, no rashes or significant lesions EYES: normal, Conjunctiva are pink and non-injected, sclera clear OROPHARYNX: no exudate, no erythema and lips, buccal mucosa, and tongue normal  NECK: supple, thyroid normal size, non-tender, without nodularity LYMPH: no palpable lymphadenopathy in the cervical, axillary or inguinal LUNGS: clear to auscultation and percussion with normal breathing effort HEART: regular rate & rhythm and no murmurs and no lower extremity edema ABDOMEN: abdomen soft, non-tender and normal bowel sounds MUSCULOSKELETAL: no cyanosis of digits and no clubbing  NEURO: alert & oriented x 3 with fluent speech, no focal motor/sensory deficits EXTREMITIES: No lower extremity edema  LABORATORY DATA:  I have reviewed the data as listed CMP Latest Ref Rng & Units 08/22/2019 08/15/2019 08/08/2019  Glucose 70 - 99 mg/dL 201(H) 145(H) 134(H)  BUN 8 - 23 mg/dL 25(H) 27(H) 26(H)  Creatinine 0.44 - 1.00 mg/dL 1.09(H) 0.90 0.95  Sodium 135 - 145 mmol/L 136 139 139  Potassium 3.5 - 5.1 mmol/L 4.3 4.1 4.2  Chloride  98 - 111 mmol/L 103 106 105  CO2 22 - 32 mmol/L 24 24 25   Calcium 8.9 - 10.3 mg/dL 9.2 9.2 9.3  Total Protein 6.5 - 8.1 g/dL 7.0 6.6 7.1  Total Bilirubin 0.3 - 1.2 mg/dL 0.7 0.6 0.6  Alkaline Phos 38 - 126 U/L 112 105 119  AST 15 - 41 U/L 29 34 41  ALT 0 - 44 U/L 41 60(H) 74(H)    Lab Results  Component Value Date   WBC 5.2 08/29/2019   HGB 9.2 (L) 08/29/2019   HCT 27.5 (L) 08/29/2019   MCV 91.1 08/29/2019   PLT 270 08/29/2019   NEUTROABS 2.9 08/29/2019    ASSESSMENT & PLAN:  Malignant neoplasm of upper-inner quadrant of right breast in female, estrogen receptor positive ([place]) 04/19/2019:Screening mammogram detected right breast asymmetry. Diagnostic mammogram and US showed 2 indeterminate right breast masses, 58m at 1 o'clock, 761mat 12 o'clock, with no  axillary adenopathy. 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 Stage Ia  05/30/2019: Right [facility]omy:[facility], grade 3, 0.6cm, clear margins, 3 axillary lymph nodes negative.HER-2 positive (3+), ER+ (80%), PR -, Ki67 40%. Stage Ia  Treatment plan: 1.Adjuvant chemo with Taxol Herceptinplan to start 06/27/2019 2. Adjuvant radiation therapy followed by 3. Adjuvant antiestrogen therapy ------------------------------------------------------------------------------------------------------------------------------------------------------- Current treatment: Completed 9 cycles of Taxol Herceptin (chemo being discontinued because of worsening neuropathy) Labs reviewed Echocardiogram EF 60 to 65%  Chemo toxicities: 1. Chemotherapy-induced anemia: Hemoglobin is9.2and will be monitored 2. Herceptin-induced diarrhea: on Imodium. 3. 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  Discoloration of the plantar side of the elbows: Being monitored  I changed her whole treatment plan to every 3-week Herceptin.  Today she will get a 6 mg/kg Herceptin. I sent a request to our schedulers to change her appointments accordingly.    No orders of the defined types were placed in this encounter.  The patient has a good understanding of the overall plan. she agrees with it. she will call with any problems that may develop before the next visit here.  GuNicholas LoseMD 08/29/2019  I,Julious Okaorshimer, am acting as scribe for Dr. ViNicholas Lose I have reviewed the above documentation for accuracy and completeness, and I agree with the above.

## 2019-08-28 NOTE — Therapy (Addendum)
[place], [name], [address] Phone: [phone]   Fax:  [phone]  Physical Therapy Treatment  Patient Details  Name: Kathy Reese MRN: [id number] Date of Birth: 1951-07-03 Referring Provider (PT): Barry Dienes   Encounter Date: 08/28/2019  PT End of Session - 08/28/19 1005    Visit Number  6    Number of Visits  9    Date for PT Re-Evaluation  09/08/19    PT Start Time  0905    PT Stop Time  1000    PT Time Calculation (min)  55 min    Activity Tolerance  Patient tolerated treatment well    Behavior During Therapy  [facility] for tasks assessed/performed       Past Medical History:  Diagnosis Date  . Allergic rhinitis, cause unspecified   . Arthritis   . Depression   . Diverticulosis   . History of Bell's palsy   . Hypersomnia   . OSA on CPAP    not using at this time 06/15/19  . Other and unspecified hyperlipidemia   . Personal history of malignant neoplasm of ovary   . Type II or unspecified type diabetes mellitus without mention of complication, not stated as uncontrolled   . Unspecified essential hypertension   . Vertigo     Past Surgical History:  Procedure Laterality Date  . ABDOMINAL ADHESION SURGERY  12/96   exc peritoneal cyst  . BREAST BIOPSY Left 2000   stereotactic, negative  . BREAST LUMPECTOMY WITH RADIOACTIVE SEED AND SENTINEL LYMPH NODE BIOPSY Right 05/30/2019   Procedure: RIGHT BREAST LUMPECTOMY WITH BRACKETED RADIOACTIVE SEEDS  AND SENTINEL LYMPH NODE BIOPSY;  Surgeon: Stark Klein, MD;  Location: [place];  Service: General;  Laterality: Right;  . BREAST REDUCTION SURGERY Bilateral 10/03  . EYE SURGERY Left    cataracts  . LAPAROSCOPIC SALPINGO OOPHERECTOMY Right 12/96  . 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 LAPAROSCOPY ABDOMEN DIAGNOSTIC  2/08   w/LOA, LSO  . PORTACATH PLACEMENT Left 06/16/2019   Procedure:  INSERTION PORT-A-CATH;  Surgeon: Stark Klein, MD;  Location: [place];  Service: General;  Laterality: Left;  . REDUCTION MAMMAPLASTY Bilateral 2004  . TONSILLECTOMY    . TOTAL ABDOMINAL HYSTERECTOMY W/ BILATERAL SALPINGOOPHORECTOMY  5/94   secondary to uterine fibroids  . TOTAL KNEE ARTHROPLASTY Right 05/12/2013   Procedure: TOTAL KNEE ARTHROPLASTY;  Surgeon: Alta Corning, MD;  Location: [place];  Service: Orthopedics;  Laterality: Right;  . TRIGGER FINGER RELEASE  07/06/2012   Procedure: RELEASE TRIGGER FINGER/A-1 PULLEY;  Surgeon: Alta Corning, MD;  Location: [place];  Service: Orthopedics;  Laterality: Left;  left ring finger  . TUBAL LIGATION      There were no vitals filed for this visit.  Subjective Assessment - 08/28/19 1013    Subjective  Pt reports that she has been sore in her shoulder but does feel lik she is overal getting better.    Pertinent History  R breast cancer, s/p R lumpectomy and SLNB on 05/30/19, 3 nodes removed all negative, currently undergoing chemo, pt will begin radiation after chemo    Patient Stated Goals  to relieve that pain and get back to normal    Currently in Pain?  Yes    Pain Score  3     Pain Location  Elbow    Pain Orientation  Right  Aggravating Factors   reaching up    Pain Relieving Factors  rest                       [facility] Adult PT Treatment/Exercise - 08/28/19 0001      Shoulder Exercises: Supine   Protraction  AROM;Both;10 reps    Protraction Limitations  dowel demonstration for correct movement with tactile cueing.     Flexion  AAROM;Both;10 reps   with dowel with 3 sec holds   Flexion Limitations  Demonstration for correct movement and VC to avoid pain. Chest press followed by flexion.     Other Supine Exercises  Supine chest press 10x prior to supine shoulder flexion w/chest press VC/tactile cueing for correct movement.     Other Supine Exercises  Supine shoulder circles Bil UE 20x cc/cw each.        Shoulder Exercises: Seated   External Rotation  AROM;Both;10 reps    External Rotation Limitations  VC for scapular squeeze at end range in sitting with elbows at 90 degrees slight decrease in R ER compared to the L.       Shoulder Exercises: Standing   Flexion  AROM;Right;10 reps    Flexion Limitations  5 second hold with VC or pain-free stretch at end range.     ABduction  AROM;Right;10 reps    ABduction Limitations  5 second hold, VC for pain free stretch at end range.       Manual Therapy   Manual Therapy  Soft tissue mobilization;Manual Lymphatic Drainage (MLD);Passive ROM    Soft tissue mobilization  Palpable tightness/tenderness presents at the L anterior deltoid, biceps, triceps and upper trapezius; decreased significantly following light STM.     Manual Lymphatic Drainage (MLD)  Performed after STM, In supine: short neck, deep diaphragmatic breathes 5x, Bil axillary nodes, Bil inguinal nodes, Anterior inter-axillary anastomosis, R axillo-inguinal anastomosis, medial/lateral brachium, lateral brachium, R shoulder, re-worked all surfaces followed by 5 deep breathes.     Passive ROM  In supine to R shoulder into flexion/abduction, ER with oscillations for relaxation before and after soft tissue mobilization with decreased reports of pain in the superior/anteiror shoulder into end range following STM.              PT Education - 08/28/19 1411    Education Details  Pt instructed to continue with her current HEP at home.    Person(s) Educated  Patient    Methods  Explanation    Comprehension  Verbalized understanding          PT Long Term Goals - 08/11/19 1148      PT LONG TERM GOAL #1   Title  Pt will demonstrate 155 degrees of R shoulder flexion to allow her to reach overhead.    Baseline  137    Time  4    Period  Weeks    Status  New    Target Date  09/08/19      PT LONG TERM GOAL #2   Title  Pt will demonstrates 160 degrees of R shoulder abduction to allow her to  reach out to the side.    Baseline  122    Time  4    Period  Weeks    Status  New    Target Date  09/08/19      PT LONG TERM GOAL #3   Title  Pt will have no palpable cording in R antecubital fossa to allow  her to reach overhead without discomfort.    Baseline  numerous cords palpable    Time  4    Period  Weeks    Status  New    Target Date  09/08/19      PT LONG TERM GOAL #4   Title  Pt will be independent in a home exercise program for continued strengthening and stretching.    Time  4    Period  Weeks    Status  New    Target Date  09/08/19      PT LONG TERM GOAL #5   Title  Pt will report a 75% improvement in tightness across R pec muscle to allow pt to move arm without discomfort.    Time  4    Period  Weeks    Status  New    Target Date  09/08/19            Plan - 08/28/19 1005    Clinical Impression Statement  Pt presents to physical therapy with palpable tightness/tenderness noted in her R anterior deltoid, biceps/triceps and upper trapezius; decreased signficantly following light STM. P/ROM was performed before and after STM with good results including greater pain-free ROM. Pt performed A/AROM activities following STM without an increase in pain from baseline. MLD was performed following STM in order to decrease risk for edema in the R upper quadrant/RUE. Pt will benefit at this time from continued POC.    Personal Factors and Comorbidities  Transportation;Profession;Comorbidity 1;Comorbidity 2    Comorbidities  diabetes, ovarian cancer, caregiver for husband with dementia, sister brings her to appts    Examination-Activity Limitations  Reach Overhead;Carry;Lift    Examination-Participation Restrictions  Cleaning;Meal Prep;Laundry    Rehab Potential  Excellent    PT Frequency  2x / week    PT Duration  4 weeks    PT Treatment/Interventions  ADLs/Self Care Home Management;Therapeutic activities;Therapeutic exercise;Patient/family education;Manual lymph  drainage;Manual techniques;Compression bandaging;Scar mobilization;Passive range of motion;Taping;Joint Manipulations    PT Next Visit Plan  Update HEP to AA/ROM exercises. Cont STM and myofascial to cording in R antecubital fossa, AAROM exercises and PROM to R shoulder, R pec stretches, scar mobilization    PT Home Exercise Plan  post op breast, supine dowel exercises    Consulted and Agree with Plan of Care  Patient       Patient will benefit from skilled therapeutic intervention in order to improve the following deficits and impairments:  Decreased range of motion, Decreased scar mobility, Increased edema, Decreased knowledge of precautions, Decreased strength, Increased fascial restricitons, Impaired UE functional use, Postural dysfunction, Pain  Visit Diagnosis: Stiffness of right shoulder, not elsewhere classified  Pain in right upper arm  Muscle weakness (generalized)     Problem List Patient Active Problem List   Diagnosis Date Noted  . Port-A-Cath in place 07/11/2019  . Malignant neoplasm of upper-inner quadrant of right breast in female, estrogen receptor positive ([place]) 04/19/2019  . Facial weakness 11/07/2018  . 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 History of hysterectomy 10/12/2018  . History of bilateral salpingo-oophorectomy (BSO) 10/12/2018  . Osteoarthritis of right knee 05/12/2013  . HYPERLIPIDEMIA 12/30/2007  . HYPERTENSION 12/30/2007  . INSOMNIA 12/30/2007  . HYPERSOMNIA 12/30/2007  . DM w/o Complication Type II [id number]  . HYPERCHOLESTEROLEMIA 12/16/2007  . EXOGENOUS OBESITY 12/16/2007  . SLEEP APNEA, OBSTRUCTIVE 12/16/2007  . ALLERGIC RHINITIS 12/16/2007    Ander Purpura, PT 08/28/2019, 5:44 PM  [place], [name], [address] Phone: [phone]   Fax:  [phone]  Name: Mars Towsley MRN: [id number] Date of Birth:  [birth year]

## 2019-08-29 ENCOUNTER — Inpatient Hospital Stay: Payer: Medicare Other

## 2019-08-29 ENCOUNTER — Other Ambulatory Visit: Payer: Self-pay | Admitting: *Deleted

## 2019-08-29 ENCOUNTER — Inpatient Hospital Stay (HOSPITAL_BASED_OUTPATIENT_CLINIC_OR_DEPARTMENT_OTHER): Payer: Medicare Other | Admitting: Hematology and Oncology

## 2019-08-29 ENCOUNTER — Other Ambulatory Visit: Payer: Self-pay

## 2019-08-29 ENCOUNTER — Encounter: Payer: Self-pay | Admitting: *Deleted

## 2019-08-29 VITALS — HR 92

## 2019-08-29 DIAGNOSIS — Z95828 Presence of other vascular implants and grafts: Secondary | ICD-10-CM

## 2019-08-29 DIAGNOSIS — C50211 Malignant neoplasm of upper-inner quadrant of right female breast: Secondary | ICD-10-CM

## 2019-08-29 DIAGNOSIS — Z170 Estrogen receptor positive status [ER+]: Secondary | ICD-10-CM

## 2019-08-29 DIAGNOSIS — Z5112 Encounter for antineoplastic immunotherapy: Secondary | ICD-10-CM | POA: Diagnosis not present

## 2019-08-29 LAB — CBC WITH DIFFERENTIAL (CANCER CENTER ONLY)
Abs Immature Granulocytes: 0.03 10*3/uL (ref 0.00–0.07)
Basophils Absolute: 0.1 10*3/uL (ref 0.0–0.1)
Basophils Relative: 1 %
Eosinophils Absolute: 0 10*3/uL (ref 0.0–0.5)
Eosinophils Relative: 1 %
HCT: 27.5 % — ABNORMAL LOW (ref 36.0–46.0)
Hemoglobin: 9.2 g/dL — ABNORMAL LOW (ref 12.0–15.0)
Immature Granulocytes: 1 %
Lymphocytes Relative: 33 %
Lymphs Abs: 1.7 10*3/uL (ref 0.7–4.0)
MCH: 30.5 pg (ref 26.0–34.0)
MCHC: 33.5 g/dL (ref 30.0–36.0)
MCV: 91.1 fL (ref 80.0–100.0)
Monocytes Absolute: 0.5 10*3/uL (ref 0.1–1.0)
Monocytes Relative: 10 %
Neutro Abs: 2.9 10*3/uL (ref 1.7–7.7)
Neutrophils Relative %: 54 %
Platelet Count: 270 10*3/uL (ref 150–400)
RBC: 3.02 MIL/uL — ABNORMAL LOW (ref 3.87–5.11)
RDW: 16.3 % — ABNORMAL HIGH (ref 11.5–15.5)
WBC Count: 5.2 10*3/uL (ref 4.0–10.5)
nRBC: 0 % (ref 0.0–0.2)

## 2019-08-29 LAB — CMP (CANCER CENTER ONLY)
ALT: 45 U/L — ABNORMAL HIGH (ref 0–44)
AST: 35 U/L (ref 15–41)
Albumin: 3.6 g/dL (ref 3.5–5.0)
Alkaline Phosphatase: 110 U/L (ref 38–126)
Anion gap: 12 (ref 5–15)
BUN: 23 mg/dL (ref 8–23)
CO2: 24 mmol/L (ref 22–32)
Calcium: 9.1 mg/dL (ref 8.9–10.3)
Chloride: 100 mmol/L (ref 98–111)
Creatinine: 1.08 mg/dL — ABNORMAL HIGH (ref 0.44–1.00)
GFR, Est AFR Am: >60 mL/min (ref >60)
GFR, Estimated: 53 mL/min — ABNORMAL LOW (ref >60)
Glucose, Bld: 212 mg/dL — ABNORMAL HIGH (ref 70–99)
Potassium: 4 mmol/L (ref 3.5–5.1)
Sodium: 136 mmol/L (ref 135–145)
Total Bilirubin: 0.9 mg/dL (ref 0.3–1.2)
Total Protein: 6.9 g/dL (ref 6.5–8.1)

## 2019-08-29 MED ORDER — HEPARIN SOD (PORK) LOCK FLUSH 100 UNIT/ML IV SOLN
500.0000 [IU] | Freq: Once | INTRAVENOUS | Status: AC | PRN
  Administered 2019-08-29: 500 [IU]
  Filled 2019-08-29: qty 5

## 2019-08-29 MED ORDER — SODIUM CHLORIDE 0.9 % IV SOLN
Freq: Once | INTRAVENOUS | Status: AC
  Administered 2019-08-29: 12:00:00 via INTRAVENOUS
  Filled 2019-08-29: qty 250

## 2019-08-29 MED ORDER — ACETAMINOPHEN 325 MG PO TABS
ORAL_TABLET | ORAL | Status: AC
  Filled 2019-08-29: qty 1

## 2019-08-29 MED ORDER — ACETAMINOPHEN 325 MG PO TABS
ORAL_TABLET | ORAL | Status: AC
  Filled 2019-08-29: qty 2

## 2019-08-29 MED ORDER — SODIUM CHLORIDE 0.9% FLUSH
10.0000 mL | INTRAVENOUS | Status: DC | PRN
  Administered 2019-08-29: 10 mL
  Filled 2019-08-29: qty 10

## 2019-08-29 MED ORDER — TRASTUZUMAB-ANNS CHEMO 150 MG IV SOLR
600.0000 mg | Freq: Once | INTRAVENOUS | Status: AC
  Administered 2019-08-29: 600 mg via INTRAVENOUS
  Filled 2019-08-29: qty 28.57

## 2019-08-29 MED ORDER — ACETAMINOPHEN 325 MG PO TABS
650.0000 mg | ORAL_TABLET | Freq: Once | ORAL | Status: AC
  Administered 2019-08-29: 650 mg via ORAL

## 2019-08-29 MED ORDER — DIPHENHYDRAMINE HCL 50 MG/ML IJ SOLN
INTRAMUSCULAR | Status: AC
  Filled 2019-08-29: qty 1

## 2019-08-29 MED ORDER — DIPHENHYDRAMINE HCL 50 MG/ML IJ SOLN
25.0000 mg | Freq: Once | INTRAMUSCULAR | Status: AC
  Administered 2019-08-29: 25 mg via INTRAVENOUS

## 2019-08-29 MED ORDER — SODIUM CHLORIDE 0.9% FLUSH
10.0000 mL | Freq: Once | INTRAVENOUS | Status: AC
  Administered 2019-08-29: 10 mL
  Filled 2019-08-29: qty 10

## 2019-08-29 NOTE — Patient Instructions (Signed)
[facility] Discharge Instructions for Patients Receiving Chemotherapy  Today you received the following chemotherapy agents trastuzumab.  To help prevent nausea and vomiting after your treatment, we encourage you to take your nausea medication as directed.    If you develop nausea and vomiting that is not controlled by your nausea medication, call the clinic.   BELOW ARE SYMPTOMS THAT SHOULD BE REPORTED IMMEDIATELY:  *FEVER GREATER THAN 100.5 F  *CHILLS WITH OR WITHOUT FEVER  NAUSEA AND VOMITING THAT IS NOT CONTROLLED WITH YOUR NAUSEA MEDICATION  *UNUSUAL SHORTNESS OF BREATH  *UNUSUAL BRUISING OR BLEEDING  TENDERNESS IN MOUTH AND THROAT WITH OR WITHOUT PRESENCE OF ULCERS  *URINARY PROBLEMS  *BOWEL PROBLEMS  UNUSUAL RASH Items with * indicate a potential emergency and should be followed up as soon as possible.  Feel free to call the clinic should you have any questions or concerns. The clinic phone number is [phone].  Please show the CHEMO ALERT CARD at check-in to the Emergency Department and triage nurse.   

## 2019-08-29 NOTE — Patient Instructions (Signed)

## 2019-08-29 NOTE — Assessment & Plan Note (Signed)
Malignant neoplasm of upper-inner quadrant of right breast in female, estrogen receptor positive ([place]) 04/19/2019:Screening mammogram detected right breast asymmetry. Diagnostic mammogram and US showed 2 indeterminate right breast masses, 11m at 1 o'clock, 761mat 12 o'clock, with no axillary adenopathy. 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 Stage Ia  05/30/2019: [facility] DCIS, grade 3, 0.6cm, clear margins, 3 axillary lymph nodes negative.HER-2 positive (3+), ER+ (80%), PR -, Ki67 40%. Stage Ia  Treatment plan: 1.Adjuvant chemo with Taxol Herceptinplan to start 06/27/2019 2. Adjuvant radiation therapy followed by 3. Adjuvant antiestrogen therapy ------------------------------------------------------------------------------------------------------------------------------------------------------- Current treatment: Cycle10day 1 Taxol Herceptin Labs reviewed Echocardiogram EF 60 to 65%  Chemo toxicities: 1. Chemotherapy-induced anemia: Hemoglobin is9.8and will be monitored 2. Herceptin-induced diarrhea: Instructed her on how to take Imodium. 3. Chemo-induced peripheral neuropathy: Grade 1: We will continue to monitor and keep the dosage the same  4.  Discoloration of the plantar side of the elbows: Being monitored  Return to clinicweekly for chemo and every other week for follow-up with me.

## 2019-09-01 ENCOUNTER — Telehealth: Payer: Self-pay | Admitting: Hematology and Oncology

## 2019-09-01 NOTE — Telephone Encounter (Signed)
I talk with patient regarding schedule change °

## 2019-09-04 ENCOUNTER — Encounter: Payer: Self-pay | Admitting: Physical Therapy

## 2019-09-04 ENCOUNTER — Other Ambulatory Visit: Payer: Self-pay

## 2019-09-04 ENCOUNTER — Ambulatory Visit: Payer: Medicare Other | Admitting: Physical Therapy

## 2019-09-04 DIAGNOSIS — M25611 Stiffness of right shoulder, not elsewhere classified: Secondary | ICD-10-CM | POA: Diagnosis not present

## 2019-09-04 DIAGNOSIS — M6281 Muscle weakness (generalized): Secondary | ICD-10-CM

## 2019-09-04 DIAGNOSIS — M79621 Pain in right upper arm: Secondary | ICD-10-CM

## 2019-09-04 NOTE — Patient Instructions (Signed)
[email or web address] Categories Active Older Adults   OR  [employer]  pick a class :)

## 2019-09-04 NOTE — Therapy (Signed)
[place], [name], [address] Phone: [phone]   Fax:  [phone]  Physical Therapy Treatment  Patient Details  Name: Kathy Reese MRN: [id number] Date of Birth: 02-Apr-1951 Referring Provider (PT): Barry Dienes   Encounter Date: 09/04/2019  PT End of Session - 09/04/19 1058    Visit Number  7    Number of Visits  9    Date for PT Re-Evaluation  09/08/19    PT Start Time  1005    PT Stop Time  1055    PT Time Calculation (min)  50 min    Activity Tolerance  Patient tolerated treatment well    Behavior During Therapy  [facility] for tasks assessed/performed       Past Medical History:  Diagnosis Date  . Allergic rhinitis, cause unspecified   . Arthritis   . Depression   . Diverticulosis   . History of Bell's palsy   . Hypersomnia   . OSA on CPAP    not using at this time 06/15/19  . Other and unspecified hyperlipidemia   . Personal history of malignant neoplasm of ovary   . Type II or unspecified type diabetes mellitus without mention of complication, not stated as uncontrolled   . Unspecified essential hypertension   . Vertigo     Past Surgical History:  Procedure Laterality Date  . ABDOMINAL ADHESION SURGERY  12/96   exc peritoneal cyst  . BREAST BIOPSY Left 2000   stereotactic, negative  . BREAST LUMPECTOMY WITH RADIOACTIVE SEED AND SENTINEL LYMPH NODE BIOPSY Right 05/30/2019   Procedure: RIGHT BREAST LUMPECTOMY WITH BRACKETED RADIOACTIVE SEEDS  AND SENTINEL LYMPH NODE BIOPSY;  Surgeon: Stark Klein, MD;  Location: [place];  Service: General;  Laterality: Right;  . BREAST REDUCTION SURGERY Bilateral 10/03  . EYE SURGERY Left    cataracts  . LAPAROSCOPIC SALPINGO OOPHERECTOMY Right 12/96  . 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 LAPAROSCOPY ABDOMEN DIAGNOSTIC  2/08   w/LOA, LSO  . PORTACATH PLACEMENT Left 06/16/2019   Procedure:  INSERTION PORT-A-CATH;  Surgeon: Stark Klein, MD;  Location: [place];  Service: General;  Laterality: Left;  . REDUCTION MAMMAPLASTY Bilateral 2004  . TONSILLECTOMY    . TOTAL ABDOMINAL HYSTERECTOMY W/ BILATERAL SALPINGOOPHORECTOMY  5/94   secondary to uterine fibroids  . TOTAL KNEE ARTHROPLASTY Right 05/12/2013   Procedure: TOTAL KNEE ARTHROPLASTY;  Surgeon: Alta Corning, MD;  Location: [place];  Service: Orthopedics;  Laterality: Right;  . TRIGGER FINGER RELEASE  07/06/2012   Procedure: RELEASE TRIGGER FINGER/A-1 PULLEY;  Surgeon: Alta Corning, MD;  Location: [place];  Service: Orthopedics;  Laterality: Left;  left ring finger  . TUBAL LIGATION      There were no vitals filed for this visit.  Subjective Assessment - 09/04/19 1007    Subjective  Pt reports she is doing well.  She had a good holiday. She said her shoulder is doing better, but she still feels some of the cording down her arm.Kathy Reese is doing ok She got measured for a compression bra and sleeve/ glove last week    Pertinent History  R breast cancer, s/p R lumpectomy and SLNB on 05/30/19, 3 nodes removed all negative, currently undergoing chemo, pt will begin radiation after chemo    Patient Stated Goals  to relieve that pain and get back to normal    Currently in Pain?  No/denies    Pain Score  4                        [facility] Adult PT Treatment/Exercise - 09/04/19 0001      Self-Care   Self-Care  Other Self-Care Comments    Other Self-Care Comments   discussed energy conservtion techniques and fatigue managment for home with emphasis on increasing exercise       Exercises   Exercises  Shoulder;Lumbar   walk at home and to do [email or web address] chair yoga and tai chi     Lumbar Exercises: Supine   Clam  5 reps    Bent Knee Raise  5 reps      Shoulder Exercises: Pulleys   Flexion  2 minutes    ABduction  2 minutes      Manual Therapy   Manual Therapy  Edema management;Manual Lymphatic  Drainage (MLD);Passive ROM    Edema Management  Applied medium Tg soft to right arm with deep fold at the top to support right arm lymphatics.     Manual Lymphatic Drainage (MLD)  briefly to right arm especailly at elbow and upper arm     Passive ROM  In supine to R shoulder into flexion/abduction, ER             PT Education - 09/04/19 1057    Education Details  energy conservation. home walking program and [email or web address]    Person(s) Educated  Patient    Methods  Explanation;Handout    Comprehension  Verbalized understanding          PT Long Term Goals - 09/04/19 1010      PT LONG TERM GOAL #1   Title  Pt will demonstrate 155 degrees of R shoulder flexion to allow her to reach overhead.    Baseline  137 at eval,  on 09/04/2019 is is 158    Time  4    Period  Weeks    Status  Achieved      PT LONG TERM GOAL #2   Title  Pt will demonstrates 160 degrees of R shoulder abduction to allow her to reach out to the side.    Baseline  122 on eval, on 09/04/2019 it is  160    Status  Achieved      PT LONG TERM GOAL #3   Title  Pt will have no palpable cording in R antecubital fossa to allow her to reach overhead without discomfort.    Baseline  numerous cords palpable on eval, on 11/30/ 2020 cord ar palpable but less    Time  4    Period  Weeks    Status  On-going      PT LONG TERM GOAL #4   Title  Pt will be independent in a home exercise program for continued strengthening and stretching.    Time  4    Period  Weeks    Status  On-going      PT LONG TERM GOAL #5   Title  Pt will report a 75% improvement in tightness across R pec muscle to allow pt to move arm without discomfort.    Baseline  09/04/2019 pt states she cannot feel the tightness today    Status  Achieved            Plan - 09/04/19 1059    Clinical Impression Statement  Pt is improving with goals met for shoulder ROM, but she still  has mild cording in right elbow and upper arm. She benefits from tg  soft and will be getting a compression sleeve.  She finds out tomorrow if she needs radiation.  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 She has fatigue and is noticing decreased balance.  She would benefit from continuation of PT to work on strength and balance once she has acheived goals for UE    Comorbidities  diabetes, ovarian cancer, caregiver for husband with dementia, sister brings her to appts    Examination-Activity Limitations  Reach Overhead;Carry;Lift    Clinical Decision Making  Moderate    Rehab Potential  Excellent    PT Frequency  2x / week    PT Duration  4 weeks    PT Treatment/Interventions  ADLs/Self Care Home Management;Therapeutic activities;Therapeutic exercise;Patient/family education;Manual lymph drainage;Manual techniques;Compression bandaging;Scar mobilization;Passive range of motion;Taping;Joint Manipulations    PT Next Visit Plan  Update HEP to AROM exercises. and strength As needed,  Cont STM and myofascial to cording in R antecubital fossa, AAROM exercises and PROM to R shoulder, R pec stretches, scar mobilization consider adding gen exercises and balance work on next recert    PT Home Exercise Plan  post op breast, supine dowel exercises    Consulted and Agree with Plan of Care  Patient       Patient will benefit from skilled therapeutic intervention in order to improve the following deficits and impairments:  Decreased range of motion, Decreased scar mobility, Increased edema, Decreased knowledge of precautions, Decreased strength, Increased fascial restricitons, Impaired UE functional use, Postural dysfunction, Pain  Visit Diagnosis: Stiffness of right shoulder, not elsewhere classified  Pain in right upper arm  Muscle weakness (generalized)     Problem List Patient Active Problem List   Diagnosis Date Noted  . Port-A-Cath in place 07/11/2019  . Malignant neoplasm of upper-inner quadrant of right breast in female, estrogen  receptor positive ([place]) 04/19/2019  . Facial weakness 11/07/2018  . 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 History of hysterectomy 10/12/2018  . History of bilateral salpingo-oophorectomy (BSO) 10/12/2018  . Osteoarthritis of right knee 05/12/2013  . HYPERLIPIDEMIA 12/30/2007  . HYPERTENSION 12/30/2007  . INSOMNIA 12/30/2007  . HYPERSOMNIA 12/30/2007  . DM w/o Complication Type II 03/70/9643  . HYPERCHOLESTEROLEMIA 12/16/2007  . EXOGENOUS OBESITY 12/16/2007  . SLEEP APNEA, OBSTRUCTIVE 12/16/2007  . ALLERGIC RHINITIS 12/16/2007   Donato Heinz. Owens Shark PT  Norwood Levo 09/04/2019, 11:04 AM  [place], [name], [address] Phone: [phone]   Fax:  [phone]  Name: Kathy Reese MRN: [id number] Date of Birth: 05/02/1951

## 2019-09-05 ENCOUNTER — Ambulatory Visit: Payer: Medicare Other

## 2019-09-05 ENCOUNTER — Other Ambulatory Visit: Payer: Medicare Other

## 2019-09-05 NOTE — Progress Notes (Signed)
Location of Breast Cancer: Malignant neoplasm of upper-inner quadrant of right breast in female, estrogen receptor positive  Histology per Pathology Report: 05/30/19: Diagnosis 1. Breast, lumpectomy, Right w/seed - MULTIFOCAL INVASIVE DUCTAL CARCINOMA, NOTTINGHAM GRADE 3 OF 3, 0.6 CM - DUCTAL CARCINOMA IN SITU, HIGH GRADE (LESS THAN 0.1 CM; ANTERIOR MARGIN) - MARGINS UNINVOLVED BY CARCINOMA (LESS THAN 0.1 CM; POSTERIOR MARGIN) - CALCIFICATIONS ASSOCIATED WITH CARCINOMA - PREVIOUS BIOPSY SITE CHANGES PRESENT - SEE ONCOLOGY TABLE AND COMMENT BELOW 2. Lymph node, sentinel, biopsy, Right Axillary #1 - NO CARCINOMA IDENTIFIED IN ONE LYMPH NODE (0/1) 3. Lymph node, sentinel, biopsy, Right Axillary #2 - NO CARCINOMA IDENTIFIED IN ONE LYMPH NODE (0/1) 4. Lymph node, sentinel, biopsy, Right Axillary #3 - NO CARCINOMA IDENTIFIED IN ONE LYMPH NODE (0/1)  Receptor Status: ER(80%), PR (0%), Her2-neu (positive, 3+), Ki-(40%)  Did patient present with symptoms (if so, please note symptoms) or was this found on screening mammography?:The cancer was detected on a routine screening mammogram on 03/29/19 and was not palpable prior to diagnosis. Diagnostic mammogram and [place] on 04/05/19 showed 2 indeterminate masses in the right breast, measuring 546m at the 1 o'clock position and 7246mat the 12 o'clock position, with no axillary adenopathy. Biopsy on 04/12/19 showed invasive ductal carcinoma, grade 3, HER-2 positive (3+), ER 80%, PR negative, Ki67 40%. She presents to the clinic today for initial evaluation.    Past/Anticipated interventions by surgeon, if any: Right Breast Radioactive seed bracketed lumpectomy and sentinel lymph node mapping and biopsy  Indications: This patient presents with history of right breast cancer, grade 3 cT1bN0M0, +/-/+, upper inner quadrant  Surgeon: BYStark KleinPast/Anticipated interventions by medical oncology, if any: Chemotherapy Per Dr. GuLindi Adie1/24/20: ASSESSMENT & PLAN:   Malignant neoplasm of upper-inner quadrant of right breast in female, estrogen receptor positive ([place]7/15/[place]:Screening mammogram detected right breast asymmetry. Diagnostic mammogram and [place] 2 indeterminate right breast masses, 46m37mt 1 o'clock, 7mm48m 12 o'clock, with no axillary adenopathy. 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 Stage Ia  05/30/2019: Right [facility], grade 3, 0.6cm, clear margins, 3 axillary lymph nodes negative.HER-2 positive (3+), ER+ (80%), PR -, Ki67 40%. Stage Ia  Treatment plan: 1.Adjuvant chemo with Taxol Herceptinplan to start 06/27/2019 2. Adjuvant radiation therapy followed by 3. Adjuvant antiestrogen therapy ------------------------------------------------------------------------------------------------------------------------------------------------------- Current treatment: Completed 9 cycles of Taxol Herceptin (chemo being discontinued because of worsening neuropathy) Labs reviewed Echocardiogram EF 60 to 65%  Chemo toxicities: 1.Chemotherapy-induced anemia: Hemoglobin is9.2and will be monitored 2.Herceptin-induced diarrhea: on Imodium. 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  Lymphedema issues, if any:  Pt reports lymphedema and states she is participating in physical therapy.  Pain issues, if any: Pt denies c/o pain.   SAFETY ISSUES:  Prior radiation? No  Pacemaker/ICD? No  Possible current pregnancy? No  Is the patient on methotrexate? No  Current Complaints / other details:  Pt presents today for initial consult with Dr. KinaSondra Come Radiation Oncology.  BP 132/64 (BP Location: Left Wrist, Patient Position: Sitting, Cuff Size: Small)   Pulse 66   Temp 98.3 F (36.8 C)   Resp  20   Wt 204 lb 9.6 oz (92.8 kg)   LMP 02/02/1993 (Exact Date)   SpO2 100%   BMI 32.04 kg/m   Wt Readings from Last 3  Encounters:  09/06/19 204 lb 9.6 oz (92.8 kg)  08/29/19 205 lb 14.4 oz (93.4 kg)  08/22/19 206 lb 4 oz (93.6 kg)       Loma Sousa, RN 09/06/2019,11:04 AM

## 2019-09-06 ENCOUNTER — Encounter: Payer: Self-pay | Admitting: Radiation Oncology

## 2019-09-06 ENCOUNTER — Ambulatory Visit
Admission: RE | Admit: 2019-09-06 | Discharge: 2019-09-06 | Disposition: A | Discharge status: Home / Self Care | Payer: Medicare Other | Source: Ambulatory Visit | Attending: Radiation Oncology | Admitting: Radiation Oncology

## 2019-09-06 ENCOUNTER — Other Ambulatory Visit: Payer: Self-pay

## 2019-09-06 VITALS — BP 132/64 | HR 66 | Temp 98.3°F | Resp 20 | Wt 204.6 lb

## 2019-09-06 DIAGNOSIS — F329 Major depressive disorder, single episode, unspecified: Secondary | ICD-10-CM | POA: Insufficient documentation

## 2019-09-06 DIAGNOSIS — Z8543 Personal history of malignant neoplasm of ovary: Secondary | ICD-10-CM | POA: Insufficient documentation

## 2019-09-06 DIAGNOSIS — Z90710 Acquired absence of both cervix and uterus: Secondary | ICD-10-CM | POA: Diagnosis not present

## 2019-09-06 DIAGNOSIS — Z79899 Other long term (current) drug therapy: Secondary | ICD-10-CM | POA: Insufficient documentation

## 2019-09-06 DIAGNOSIS — C50211 Malignant neoplasm of upper-inner quadrant of right female breast: Secondary | ICD-10-CM

## 2019-09-06 DIAGNOSIS — I10 Essential (primary) hypertension: Secondary | ICD-10-CM | POA: Insufficient documentation

## 2019-09-06 DIAGNOSIS — M129 Arthropathy, unspecified: Secondary | ICD-10-CM | POA: Diagnosis not present

## 2019-09-06 DIAGNOSIS — Z170 Estrogen receptor positive status [ER+]: Secondary | ICD-10-CM

## 2019-09-06 DIAGNOSIS — Z7982 Long term (current) use of aspirin: Secondary | ICD-10-CM | POA: Diagnosis not present

## 2019-09-06 DIAGNOSIS — Z90722 Acquired absence of ovaries, bilateral: Secondary | ICD-10-CM | POA: Diagnosis not present

## 2019-09-06 NOTE — Progress Notes (Signed)
Radiation Oncology         [phone] ________________________________  Initial Outpatient Consultation  Name: Kathy Reese MRN: [id number]  Date: 09/06/2019  DOB: 1951-01-17  OE:[id number], Christean Grief, MD  Nicholas Lose, MD   REFERRING PHYSICIAN: Nicholas Lose, MD  DIAGNOSIS: The encounter diagnosis was Malignant neoplasm of upper-inner quadrant of right breast in female, estrogen receptor positive ([place]).  Stage IA, (pT1b, pN0, cM0) Right Breast UIQ, Invasive Ductal Carcinoma with DCIS, ER+ / PR- / Her2+, Grade 3  HISTORY OF PRESENT ILLNESS::Kathy Reese is a 68 y.o. female who is accompanied by no one due to COVID-19 restrictions. She had routine screening mammography on 03/29/2019 showing a possible abnormality in the right breast. She underwent unilateral diagnostic mammography and right breast ultrasonography on 04/05/2019 showing two indeterminate masses in the superior right breast at 12 o'clock and 1 o'clock.  Biopsy on 04/12/2019 showed invasive ductal carcinoma. Prognostic indicators significant for: estrogen receptor, 80% positive with moderate staining intensity and progesterone receptor, 0% negative. Proliferation marker Ki67 at 40%. HER2 positive.  MRI of bilateral breasts on 04/27/2019 showed two biopsy-proven sub-centimeter malignant masses in the upper inner quadrant of the right breast with suspicious clumped non-mass enhancement in a ductal distribution anterior to the known malignancy. The anterior to posterior extent of the suspicious enhancement, including the biopsy-proven malignancies, is approximately 5.3 cm. No evidence of malignancy in the left breast.  MRI guided core needle biopsy of the right breast performed on 05/08/2019 showed high grade ductal carcinoma in situ with calcifications and necrosis.  She opted to proceed with right breast lumpectomy with nodal biopsy on 05/30/2019. Pathology from the procedure revealed multifocal invasive ductal  carcinoma (grade 3) and ductal carcinoma in situ (high grade) of right breast. Margins uninvolved by carcinoma. However, there were found to be some calcifications associated with carcinoma. Three right axillary lymph nodes were also biopsied, all of which were negative for carcinoma.  Echocardiogram performed on 06/13/2019 showed an EF of 60-65%.  She began adjuvant chemotherapy on 06/27/2019 under the care of Dr. Lindi Adie. She did experience some chemotherapy-related anemia, Herceptin-induced diarrhea, chemotherapy-induced peripheral neuropathy, and discoloration of the plantar aspect of bilateral elbows. Taxol was discontinued on 08/29/2019 after nine cycles secondary to worsening neuropathy. Patient is currently on 3-week Herceptin.  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 Last follow-up with Dr. Talbert Nan, OBGYN, was on 10/12/2018. No significant findings at that time.  PREVIOUS RADIATION THERAPY: No  PAST MEDICAL HISTORY:  Past Medical History:  Diagnosis Date  . Allergic rhinitis, cause unspecified   . Arthritis   . Depression   . Diverticulosis   . History of Bell's palsy   . Hypersomnia   . OSA on CPAP    not using at this time 06/15/19  . Other and unspecified hyperlipidemia   . Personal history of malignant neoplasm of ovary   . Type II or unspecified type diabetes mellitus without mention of complication, not stated as uncontrolled   . Unspecified essential hypertension   . Vertigo     PAST SURGICAL HISTORY: Past Surgical History:  Procedure Laterality Date  . ABDOMINAL ADHESION SURGERY  12/96   exc peritoneal cyst  . BREAST BIOPSY Left 2000   stereotactic, negative  . BREAST LUMPECTOMY WITH RADIOACTIVE SEED AND SENTINEL LYMPH NODE BIOPSY Right 05/30/2019   Procedure: RIGHT BREAST LUMPECTOMY WITH BRACKETED RADIOACTIVE SEEDS  AND SENTINEL LYMPH NODE  BIOPSY;  Surgeon: Stark Klein, MD;  Location: [facility];  Service: General;  Laterality: Right;  . BREAST REDUCTION SURGERY Bilateral 10/03  . EYE SURGERY Left    cataracts  . LAPAROSCOPIC SALPINGO OOPHERECTOMY Right 12/96  . 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 LAPAROSCOPY ABDOMEN DIAGNOSTIC  2/08   w/LOA, LSO  . PORTACATH PLACEMENT Left 06/16/2019   Procedure: INSERTION PORT-A-CATH;  Surgeon: Stark Klein, MD;  Location: [facility];  Service: General;  Laterality: Left;  . REDUCTION MAMMAPLASTY Bilateral 2004  . TONSILLECTOMY    . TOTAL ABDOMINAL HYSTERECTOMY W/ BILATERAL SALPINGOOPHORECTOMY  5/94   secondary to uterine fibroids  . TOTAL KNEE ARTHROPLASTY Right 05/12/2013   Procedure: TOTAL KNEE ARTHROPLASTY;  Surgeon: Alta Corning, MD;  Location: [facility];  Service: Orthopedics;  Laterality: Right;  . TRIGGER FINGER RELEASE  07/06/2012   Procedure: RELEASE TRIGGER FINGER/A-1 PULLEY;  Surgeon: Alta Corning, MD;  Location: [facility];  Service: Orthopedics;  Laterality: Left;  left ring finger  . TUBAL LIGATION      FAMILY HISTORY:  Family History  Problem Relation Age of Onset  . Lung cancer Mother   . Colon cancer Father   . Stroke Brother   . Stroke Sister   . Pulmonary Hypertension Sister     SOCIAL HISTORY:  Social History   Tobacco Use  . Smoking status: Never Smoker  . Smokeless tobacco: Never Used  Substance Use Topics  . Alcohol use: Yes    Alcohol/week: 1.0 standard drinks    Types: 1 Glasses of wine per week    Comment: occasionally  . Drug use: No    ALLERGIES: No Known Allergies  MEDICATIONS:  Current Outpatient Medications  Medication Sig Dispense Refill  . ACCU-CHEK AVIVA PLUS test strip     . Accu-Chek Softclix Lancets lancets     . aspirin EC 81 MG tablet Take 81 mg by mouth daily.    Marland Kitchen atorvastatin (LIPITOR) 40 MG tablet Take 40 mg by mouth daily at 6 PM.     . Calcium Carb-Cholecalciferol (CALCIUM 600+D3 PO) Take 1 tablet by mouth 2  (two) times a day.    . cholecalciferol (VITAMIN D3) 25 MCG (1000 UT) tablet Take 1,000 Units by mouth daily.    . citalopram (CELEXA) 10 MG tablet Take 10 mg by mouth daily.    Marland Kitchen gabapentin (NEURONTIN) 100 MG capsule Take 100 mg by mouth 2 (two) times daily.    . insulin detemir (LEVEMIR) 100 UNIT/ML injection Inject 12-15 Units into the skin See admin instructions. Inject 15 units subcutaneously in the morning & 12 units subcutaneously at night.    Marland Kitchen ipratropium (ATROVENT) 0.06 % nasal spray SMARTSIG:2 Spray(s) Both Nares Every 6 Hours PRN    . lidocaine-prilocaine (EMLA) cream Apply to affected area once 30 g 3  . LORazepam (ATIVAN) 0.5 MG tablet Take 1 tablet (0.5 mg total) by mouth at bedtime as needed for sleep. 30 tablet 0  . losartan-hydrochlorothiazide (HYZAAR) 100-25 MG tablet Take 1 tablet by mouth daily.  4  . meloxicam (MOBIC) 15 MG tablet Take 15 mg by mouth daily as needed for pain.     . metFORMIN (GLUCOPHAGE) 500 MG tablet Take 500 mg by mouth every morning.     . metoprolol (TOPROL-XL) 50 MG 24 hr tablet Take 50 mg by mouth daily.      . Cinnamon 500 MG capsule  Take 500 mg by mouth daily.     . ondansetron (ZOFRAN) 8 MG tablet Take 1 tablet (8 mg total) by mouth 2 (two) times daily as needed (Nausea or vomiting). (Patient not taking: Reported on 09/06/2019) 30 tablet 1  . oxyCODONE (OXY IR/ROXICODONE) 5 MG immediate release tablet Take 1 tablet (5 mg total) by mouth every 6 (six) hours as needed for severe pain. (Patient not taking: Reported on 06/14/2019) 20 tablet 0  . prochlorperazine (COMPAZINE) 10 MG tablet Take 1 tablet (10 mg total) by mouth every 6 (six) hours as needed (Nausea or vomiting). (Patient not taking: Reported on 09/06/2019) 30 tablet 1   No current facility-administered medications for this encounter.     REVIEW OF SYSTEMS:  A 10+ POINT REVIEW OF SYSTEMS WAS OBTAINED including neurology, dermatology, psychiatry, cardiac, respiratory, lymph, extremities, GI, GU,  musculoskeletal, constitutional, reproductive, HEENT.  She reports a poor appetite with chemotherapy.  She denies any pain within the right breast nipple discharge or bleeding.  She denies any problems with swelling in her right arm or hand.   PHYSICAL EXAM:  weight is 204 lb 9.6 oz (92.8 kg). Her temperature is 98.3 F (36.8 C). Her blood pressure is 132/64 and her pulse is 66. Her respiration is 20 and oxygen saturation is 100%.   General: Alert and oriented, in no acute distress HEENT: Head is normocephalic. Extraocular movements are intact. Oropharynx is clear. Neck: Neck is supple, no palpable cervical or supraclavicular lymphadenopathy. Heart: Regular in rate and rhythm with no murmurs, rubs, or gallops. Chest: Clear to auscultation bilaterally, with no rhonchi, wheezes, or rales.  With portacath site present in the left upper chest Abdomen: Soft, nontender, nondistended, with no rigidity or guarding. Extremities: No cyanosis or edema. Lymphatics: see Neck Exam Skin: No concerning lesions. Musculoskeletal: symmetric strength and muscle tone throughout. Neurologic: Cranial nerves II through XII are grossly intact. No obvious focalities. Speech is fluent. Coordination is intact. Psychiatric: Judgment and insight are intact. Affect is appropriate. Left breast: no palpable mass, nipple discharge or bleeding. Right breast: Well-healing scar in the periAreolar aspect.  No dominant mass appreciated in the  breast nipple discharge or bleeding.  Patient has a separate scar in the axillary region from her sentinel node procedure.  She also has scars along the lower aspect of both breasts from prior reduction mammoplasty  ECOG = 1  0 - Asymptomatic (Fully active, able to carry on all predisease activities without restriction)  1 - Symptomatic but completely ambulatory (Restricted in physically strenuous activity but ambulatory and able to carry out work of a light or sedentary nature. For example,  light housework, office work)  2 - Symptomatic, <50% in bed during the day (Ambulatory and capable of all self care but unable to carry out any work activities. Up and about more than 50% of waking hours)  3 - Symptomatic, >50% in bed, but not bedbound (Capable of only limited self-care, confined to bed or chair 50% or more of waking hours)  4 - Bedbound (Completely disabled. Cannot carry on any self-care. Totally confined to bed or chair)  5 - Death   Eustace Pen MM, Creech RH, Tormey DC, et al. [phone]). "Toxicity and response criteria of the [facility]". [place] Oncol. 5 (6): 649-55  LABORATORY DATA:  Lab Results  Component Value Date   WBC 5.2 08/29/2019   HGB 9.2 (L) 08/29/2019   HCT 27.5 (L) 08/29/2019   MCV 91.1 08/29/2019  PLT 270 08/29/2019   NEUTROABS 2.9 08/29/2019   Lab Results  Component Value Date   NA 136 08/29/2019   K 4.0 08/29/2019   CL 100 08/29/2019   CO2 24 08/29/2019   GLUCOSE 212 (H) 08/29/2019   CREATININE 1.08 (H) 08/29/2019   CALCIUM 9.1 08/29/2019      RADIOGRAPHY: No results found.    IMPRESSION: Stage IA, (pT1b, pN0, cM0) Right Breast UIQ, Invasive Ductal Carcinoma with DCIS, ER+ / PR- / Her2+, Grade 3  The patient will be a good candidate for breast conservation with radiation therapy directed at the right breast.  With her prior reduction mammoplasty she would appear to be a good candidate for hypofractionated accelerated radiation therapy of approximately 4 weeks.  Today, I talked to the patient  about the findings and work-up thus far.  We discussed the natural history of breast cancer and general treatment, highlighting the role of radiotherapy in the management.  We discussed the available radiation techniques, and focused on the details of logistics and delivery.  We reviewed the anticipated acute and late sequelae associated with radiation in this setting.  The patient was encouraged to ask questions that I answered to  the best of my ability.  A patient consent form was discussed and signed.  We retained a copy for our records.  The patient would like to proceed with radiation and will be scheduled for CT simulation.  PLAN: Simulation on December 14 at 10 AM with treatments to begin soon afterward. anticipate Hypofractionated accelerated radiation therapy.    ------------------------------------------------  Blair Promise, PhD, MD  This document serves as a record of services personally performed by Gery Pray, MD. It was created on his behalf by Clerance Lav, a trained medical scribe. The creation of this record is based on the scribe's personal observations and the provider's statements to them. This document has been checked and approved by the attending provider.

## 2019-09-06 NOTE — Patient Instructions (Signed)
Coronavirus (COVID-19) Are you at risk?  Are you at risk for the Coronavirus (COVID-19)?  To be considered HIGH RISK for Coronavirus (COVID-19), you have to meet the following criteria:  . Traveled to [place], [place], [place], [place] or [place]; or in the [place] to [place], [place], [place], or [place]; and have fever, cough, and shortness of breath within the last 2 weeks of travel OR . Been in close contact with a person diagnosed with COVID-19 within the last 2 weeks and have fever, cough, and shortness of breath . IF YOU DO NOT MEET THESE CRITERIA, YOU ARE CONSIDERED LOW RISK FOR COVID-19.  What to do if you are HIGH RISK for COVID-19?  . If you are having a medical emergency, call 911. . Seek medical care right away. Before you go to a doctor's office, urgent care or emergency department, call ahead and tell them about your recent travel, contact with someone diagnosed with COVID-19, and your symptoms. You should receive instructions from your physician's office regarding next steps of care.  . When you arrive at healthcare provider, tell the healthcare staff immediately you have returned from visiting [place], [place], [place], [place] or [place]; or traveled in the [place] to [place], [place], [place], or [place]; in the last two weeks or you have been in close contact with a person diagnosed with COVID-19 in the last 2 weeks.   . Tell the health care staff about your symptoms: fever, cough and shortness of breath. . After you have been seen by a medical provider, you will be either: o Tested for (COVID-19) and discharged home on quarantine except to seek medical care if symptoms worsen, and asked to  - Stay home and avoid contact with others until you get your results (4-5 days)  - Avoid travel on public transportation if possible (such as bus, train, or airplane) or o Sent to the Emergency Department by EMS for evaluation, COVID-19 testing, and possible  admission depending on your condition and test results.  What to do if you are LOW RISK for COVID-19?  Reduce your risk of any infection by using the same precautions used for avoiding the common cold or flu:  . Wash your hands often with soap and warm water for at least 20 seconds.  If soap and water are not readily available, use an alcohol-based hand sanitizer with at least 60% alcohol.  . If coughing or sneezing, cover your mouth and nose by coughing or sneezing into the elbow areas of your shirt or coat, into a tissue or into your sleeve (not your hands). . Avoid shaking hands with others and consider head nods or verbal greetings only. . Avoid touching your eyes, nose, or mouth with unwashed hands.  . Avoid close contact with people who are sick. . Avoid places or events with large numbers of people in one location, like concerts or sporting events. . Carefully consider travel plans you have or are making. . If you are planning any travel outside or inside the [place], visit the CDC's Travelers' Health webpage for the latest health notices. . If you have some symptoms but not all symptoms, continue to monitor at home and seek medical attention if your symptoms worsen. . If you are having a medical emergency, call 911.   ADDITIONAL HEALTHCARE OPTIONS FOR PATIENTS  [facility] / e-Visit: [email or web address]         [facility]: [phone]  [facility]: [phone]                   [facility]: [phone]   

## 2019-09-08 ENCOUNTER — Ambulatory Visit: Payer: Medicare Other | Attending: General Surgery | Admitting: Physical Therapy

## 2019-09-08 ENCOUNTER — Other Ambulatory Visit: Payer: Self-pay

## 2019-09-08 ENCOUNTER — Encounter: Payer: Self-pay | Admitting: Physical Therapy

## 2019-09-08 DIAGNOSIS — R262 Difficulty in walking, not elsewhere classified: Secondary | ICD-10-CM | POA: Insufficient documentation

## 2019-09-08 DIAGNOSIS — M6281 Muscle weakness (generalized): Secondary | ICD-10-CM | POA: Insufficient documentation

## 2019-09-08 DIAGNOSIS — I890 Lymphedema, not elsewhere classified: Secondary | ICD-10-CM | POA: Diagnosis present

## 2019-09-08 DIAGNOSIS — M25611 Stiffness of right shoulder, not elsewhere classified: Secondary | ICD-10-CM | POA: Diagnosis not present

## 2019-09-08 DIAGNOSIS — M79621 Pain in right upper arm: Secondary | ICD-10-CM | POA: Insufficient documentation

## 2019-09-08 NOTE — Patient Instructions (Signed)
Over Head Pull: Narrow and Wide Grip   Cancer Rehab [phone]   On back, knees bent, feet flat, band across thighs, elbows straight but relaxed. Pull hands apart (start). Keeping elbows straight, bring arms up and over head, hands toward floor. Keep pull steady on band. Hold momentarily. Return slowly, keeping pull steady, back to start. Then do same with a wider grip on the band (past shoulder width) Repeat _10__ times. Band color __yellow____   Side Pull: Double Arm   On back, knees bent, feet flat. Arms perpendicular to body, shoulder level, elbows straight but relaxed. Pull arms out to sides, elbows straight. Resistance band comes across collarbones, hands toward floor. Hold momentarily. Slowly return to starting position. Repeat _10__ times. Band color _yellow____   Sword   On back, knees bent, feet flat, left hand on left hip, right hand above left. Pull right arm DIAGONALLY (hip to shoulder) across chest. Go from thumb down near pocket to thumb up my head. Bring right arm along head toward floor. Hold momentarily. Slowly return to starting position. Repeat _10__ times. Do with left arm. Band color _yellow_____   Shoulder Rotation: Double Arm   On back, knees bent, feet flat, elbows tucked at sides, bent 90, hands palms up. Pull hands apart and down toward floor, keeping elbows near sides. Hold momentarily. Slowly return to starting position. Repeat _10__ times. Band color __yellow____   Repeat all on opposite side.

## 2019-09-08 NOTE — Therapy (Signed)
[place], [name], [address] Phone: [phone]   Fax:  [phone]  Physical Therapy Treatment  Patient Details  Name: Kathy Reese MRN: [id number] Date of Birth: 04/04/1951 Referring Provider (PT): Barry Dienes   Encounter Date: 09/08/2019  PT End of Session - 09/08/19 0952    Visit Number  8    Number of Visits  17    Date for PT Re-Evaluation  10/06/19    PT Start Time  0903    PT Stop Time  0950    PT Time Calculation (min)  47 min    Activity Tolerance  Patient tolerated treatment well    Behavior During Therapy  [facility] for tasks assessed/performed       Past Medical History:  Diagnosis Date  . Allergic rhinitis, cause unspecified   . Arthritis   . Depression   . Diverticulosis   . History of Bell's palsy   . Hypersomnia   . OSA on CPAP    not using at this time 06/15/19  . Other and unspecified hyperlipidemia   . Personal history of malignant neoplasm of ovary   . Type II or unspecified type diabetes mellitus without mention of complication, not stated as uncontrolled   . Unspecified essential hypertension   . Vertigo     Past Surgical History:  Procedure Laterality Date  . ABDOMINAL ADHESION SURGERY  12/96   exc peritoneal cyst  . BREAST BIOPSY Left 2000   stereotactic, negative  . BREAST LUMPECTOMY WITH RADIOACTIVE SEED AND SENTINEL LYMPH NODE BIOPSY Right 05/30/2019   Procedure: RIGHT BREAST LUMPECTOMY WITH BRACKETED RADIOACTIVE SEEDS  AND SENTINEL LYMPH NODE BIOPSY;  Surgeon: Stark Klein, MD;  Location: [place];  Service: General;  Laterality: Right;  . BREAST REDUCTION SURGERY Bilateral 10/03  . EYE SURGERY Left    cataracts  . LAPAROSCOPIC SALPINGO OOPHERECTOMY Right 12/96  . 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 LAPAROSCOPY ABDOMEN DIAGNOSTIC  2/08   w/LOA, LSO  . PORTACATH PLACEMENT Left 06/16/2019   Procedure:  INSERTION PORT-A-CATH;  Surgeon: Stark Klein, MD;  Location: [place];  Service: General;  Laterality: Left;  . REDUCTION MAMMAPLASTY Bilateral 2004  . TONSILLECTOMY    . TOTAL ABDOMINAL HYSTERECTOMY W/ BILATERAL SALPINGOOPHORECTOMY  5/94   secondary to uterine fibroids  . TOTAL KNEE ARTHROPLASTY Right 05/12/2013   Procedure: TOTAL KNEE ARTHROPLASTY;  Surgeon: Alta Corning, MD;  Location: [place];  Service: Orthopedics;  Laterality: Right;  . TRIGGER FINGER RELEASE  07/06/2012   Procedure: RELEASE TRIGGER FINGER/A-1 PULLEY;  Surgeon: Alta Corning, MD;  Location: [place];  Service: Orthopedics;  Laterality: Left;  left ring finger  . TUBAL LIGATION      There were no vitals filed for this visit.  Subjective Assessment - 09/08/19 0903    Subjective  The cording is still there but it is not as bad as it was.    Pertinent History  R breast cancer, s/p R lumpectomy and SLNB on 05/30/19, 3 nodes removed all negative, currently undergoing chemo, pt will begin radiation after chemo    Patient Stated Goals  to relieve that pain and get back to normal    Currently in Pain?  No/denies    Pain Score  0-No pain         [facility] PT Assessment - 09/08/19 0001      ROM / Strength  AROM / PROM / Strength  Strength      Strength   Right Hip Flexion  3/5    Right Hip ABduction  3+/5   in sitting   Right Hip ADduction  3+/5    Left Hip Flexion  3/5    Left Hip ABduction  3+/5   in sitting   Left Hip ADduction  3+/5    Right Knee Flexion  3+/5    Right Knee Extension  3+/5    Left Knee Flexion  4-/5    Left Knee Extension  4/5    Right Ankle Dorsiflexion  4/5    Left Ankle Dorsiflexion  4/5      Transfers   Transfers  Sit to Stand    Comments  30 sec sit to stand : 3 reps without use of UEs - average for her age is 62 reps      Standardized Balance Assessment   Standardized Balance Assessment  --   SLS: 2 sec on R, 7 sec on L                   [facility] Adult PT  Treatment/Exercise - 09/08/19 0001      Shoulder Exercises: Supine   Horizontal ABduction  Strengthening;10 reps;Both;Theraband   pt returned therapist demo, v/c to keep elbows straight   Theraband Level (Shoulder Horizontal ABduction)  Level 1 (Yellow)    External Rotation  Strengthening;Both;10 reps;Theraband   pt returned therapist demo   Theraband Level (Shoulder External Rotation)  Level 1 (Yellow)    Flexion  Strengthening;Both;10 reps;Theraband   narrow and wide grip, pt returned therapist demo   Theraband Level (Shoulder Flexion)  Level 1 (Yellow)    Diagonals  Strengthening;Both;10 reps;Theraband   pt returned therapist demo, v/c for correct form   Theraband Level (Shoulder Diagonals)  Level 1 (Yellow)      Manual Therapy   Soft tissue mobilization  briefly to cording at antecubital fossa    Passive ROM  in supine to R shoulder in direction of flexion and abduction with prolonged holds                  PT Long Term Goals - 09/08/19 0904      PT LONG TERM GOAL #1   Title  Pt will demonstrate 155 degrees of R shoulder flexion to allow her to reach overhead.    Baseline  137 at eval,  on 09/04/2019 is is 158    Time  4    Period  Weeks    Status  Achieved      PT LONG TERM GOAL #2   Title  Pt will demonstrates 160 degrees of R shoulder abduction to allow her to reach out to the side.    Baseline  122 on eval, on 09/04/2019 it is  160    Time  4    Period  Weeks    Status  Achieved      PT LONG TERM GOAL #3   Title  Pt will have no palpable cording in R antecubital fossa to allow her to reach overhead without discomfort.    Baseline  numerous cords palpable on eval, on 11/30/ 2020 cord ar palpable but less, 09/08/19- some cording palpable    Time  4    Period  Weeks    Status  On-going      PT LONG TERM GOAL #4   Title  Pt will be independent in a  home exercise program for continued strengthening and stretching.    Time  4    Period  Weeks    Status   On-going      PT LONG TERM GOAL #5   Title  Pt will report a 75% improvement in tightness across R pec muscle to allow pt to move arm without discomfort.    Baseline  09/04/2019 pt states she cannot feel the tightness today    Time  4    Period  Weeks    Status  Achieved      Additional Long Term Goals   Additional Long Term Goals  Yes      PT LONG TERM GOAL #6   Title  Pt will be able to walk through the grocery store without any seated recovery periods or having to leave due to fatigue.    Baseline  pt had to leave due to fatigue    Time  4    Period  Weeks    Status  New    Target Date  10/06/19      PT LONG TERM GOAL #7   Title  Pt will demonstrate 4/5 hip flexor strength to decrease fall risk.    Baseline  R 3/5, L 3/5    Time  4    Period  Weeks    Status  New    Target Date  10/06/19      PT LONG TERM GOAL #8   Title  Pt will be able to complete 13 reps of sit to stands in 30 seconds to decrease fall risk.    Baseline  3 reps without use of hands    Time  4    Period  Weeks    Status  New    Target Date  10/06/19      PT LONG TERM GOAL  #9   TITLE  Pt will be able to stand on R and L leg in SLS for 12 seconds bilaterally.    Baseline  R 2 seconds, L 7 seconds    Time  4    Period  Weeks    Status  New    Target Date  10/06/19            Plan - 09/08/19 6010    Clinical Impression Statement  Assessed pt's progress towards all goals in therapy today. She has met nearly all of her UE goals but still does have cording present though it does not interfere with her ROM as much as it did at eval. Assessed LE strength today as well as balance and added goals to address those. She has significant weakness in her bilateral LEs and was only able to complete 3 sit to stands in 30 sec without use of UEs which is poor for her age. She reports she recently went to the grocery store and had to leave due to fatigue. Pt would benefit from additional skilled PT visits to  continue to progress towards goal for cording and to begin LE strengthening and balance exercises to improve functional mobility.    Personal Factors and Comorbidities  Transportation;Profession;Comorbidity 1;Comorbidity 2    Comorbidities  diabetes, ovarian cancer, caregiver for husband with dementia, sister brings her to appts    Examination-Activity Limitations  Reach Overhead;Carry;Lift    Examination-Participation Restrictions  Cleaning;Meal Prep;Laundry    PT Frequency  2x / week    PT Duration  4 weeks    PT Treatment/Interventions  ADLs/Self Care  Home Management;Therapeutic activities;Therapeutic exercise;Patient/family education;Manual lymph drainage;Manual techniques;Compression bandaging;Scar mobilization;Passive range of motion;Taping;Joint Manipulations;Neuromuscular re-education;Gait training;Balance training    PT Next Visit Plan  begin LE strengthening and balance exercises, assess indep with supine scap, Cont STM and myofascial to cording in R antecubital fossa, AAROM exercises and PROM to R shoulder, R pec stretches, scar mobilization, eventually give strength ABC program    PT Home Exercise Plan  post op breast, supine dowel exercises, supine scap    Consulted and Agree with Plan of Care  Patient       Patient will benefit from skilled therapeutic intervention in order to improve the following deficits and impairments:  Decreased range of motion, Decreased scar mobility, Increased edema, Decreased knowledge of precautions, Decreased strength, Increased fascial restricitons, Impaired UE functional use, Postural dysfunction, Pain, Decreased activity tolerance, Difficulty walking, Decreased balance, Decreased endurance, Decreased mobility  Visit Diagnosis: Stiffness of right shoulder, not elsewhere classified  Muscle weakness (generalized)  Difficulty in walking, not elsewhere classified  Pain in right upper arm     Problem List Patient Active Problem List   Diagnosis  Date Noted  . Port-A-Cath in place 07/11/2019  . Malignant neoplasm of upper-inner quadrant of right breast in female, estrogen receptor positive ([place]) 04/19/2019  . Facial weakness 11/07/2018  . 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 History of hysterectomy 10/12/2018  . History of bilateral salpingo-oophorectomy (BSO) 10/12/2018  . Osteoarthritis of right knee 05/12/2013  . HYPERLIPIDEMIA 12/30/2007  . HYPERTENSION 12/30/2007  . INSOMNIA 12/30/2007  . HYPERSOMNIA 12/30/2007  . DM w/o Complication Type II 60/11/9845  . HYPERCHOLESTEROLEMIA 12/16/2007  . EXOGENOUS OBESITY 12/16/2007  . SLEEP APNEA, OBSTRUCTIVE 12/16/2007  . ALLERGIC RHINITIS 12/16/2007    Allyson Sabal [facility] 09/08/2019, 9:58 AM  [place], [name], [address] Phone: [phone]   Fax:  [phone]  Name: Kathy Reese MRN: [id number] Date of Birth: September 25, 1951  Manus Gunning, PT 09/08/19 9:58 AM

## 2019-09-11 ENCOUNTER — Other Ambulatory Visit: Payer: Self-pay

## 2019-09-11 ENCOUNTER — Ambulatory Visit: Payer: Medicare Other | Admitting: Rehabilitation

## 2019-09-11 ENCOUNTER — Encounter: Payer: Self-pay | Admitting: Rehabilitation

## 2019-09-11 DIAGNOSIS — M79621 Pain in right upper arm: Secondary | ICD-10-CM

## 2019-09-11 DIAGNOSIS — R262 Difficulty in walking, not elsewhere classified: Secondary | ICD-10-CM

## 2019-09-11 DIAGNOSIS — M6281 Muscle weakness (generalized): Secondary | ICD-10-CM

## 2019-09-11 DIAGNOSIS — M25611 Stiffness of right shoulder, not elsewhere classified: Secondary | ICD-10-CM | POA: Diagnosis not present

## 2019-09-11 NOTE — Therapy (Signed)
[place], [name], [address] Phone: [phone]   Fax:  [phone]  Physical Therapy Treatment  Patient Details  Name: Kathy Reese MRN: [id number] Date of Birth: [birth year] Referring Provider (PT): Barry Dienes   Encounter Date: 09/11/2019  PT End of Session - 09/11/19 1309    Visit Number  9    Number of Visits  17    Date for PT Re-Evaluation  10/06/19    PT Start Time  1302    PT Stop Time  1350    PT Time Calculation (min)  48 min    Activity Tolerance  Patient tolerated treatment well    Behavior During Therapy  [facility] for tasks assessed/performed       Past Medical History:  Diagnosis Date  . Allergic rhinitis, cause unspecified   . Arthritis   . Depression   . Diverticulosis   . History of Bell's palsy   . Hypersomnia   . OSA on CPAP    not using at this time 06/15/19  . Other and unspecified hyperlipidemia   . Personal history of malignant neoplasm of ovary   . Type II or unspecified type diabetes mellitus without mention of complication, not stated as uncontrolled   . Unspecified essential hypertension   . Vertigo     Past Surgical History:  Procedure Laterality Date  . ABDOMINAL ADHESION SURGERY  12/96   exc peritoneal cyst  . BREAST BIOPSY Left 2000   stereotactic, negative  . BREAST LUMPECTOMY WITH RADIOACTIVE SEED AND SENTINEL LYMPH NODE BIOPSY Right 05/30/2019   Procedure: RIGHT BREAST LUMPECTOMY WITH BRACKETED RADIOACTIVE SEEDS  AND SENTINEL LYMPH NODE BIOPSY;  Surgeon: Stark Klein, MD;  Location: [place];  Service: General;  Laterality: Right;  . BREAST REDUCTION SURGERY Bilateral 10/03  . EYE SURGERY Left    cataracts  . LAPAROSCOPIC SALPINGO OOPHERECTOMY Right 12/96  . 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 LAPAROSCOPY ABDOMEN DIAGNOSTIC  2/08   w/LOA, LSO  . PORTACATH PLACEMENT Left 06/16/2019   Procedure:  INSERTION PORT-A-CATH;  Surgeon: Stark Klein, MD;  Location: [place];  Service: General;  Laterality: Left;  . REDUCTION MAMMAPLASTY Bilateral 2004  . TONSILLECTOMY    . TOTAL ABDOMINAL HYSTERECTOMY W/ BILATERAL SALPINGOOPHORECTOMY  5/94   secondary to uterine fibroids  . TOTAL KNEE ARTHROPLASTY Right 05/12/2013   Procedure: TOTAL KNEE ARTHROPLASTY;  Surgeon: Alta Corning, MD;  Location: [place];  Service: Orthopedics;  Laterality: Right;  . TRIGGER FINGER RELEASE  07/06/2012   Procedure: RELEASE TRIGGER FINGER/A-1 PULLEY;  Surgeon: Alta Corning, MD;  Location: [place];  Service: Orthopedics;  Laterality: Left;  left ring finger  . TUBAL LIGATION      There were no vitals filed for this visit.  Subjective Assessment - 09/11/19 1301    Subjective  I had a pull in the shoulder today.  I should start radiation after the 13th when I meet with him.  I tried my new exercises.  I brought my new bra and sleeve for you guys to check    Pertinent History  R breast cancer, s/p R lumpectomy and SLNB on 05/30/19, 3 nodes removed all negative, currently undergoing chemo, pt will begin radiation after chemo    Currently in Pain?  No/denies  [place] Adult PT Treatment/Exercise - 09/11/19 0001      High Level Balance   High Level Balance Comments  at the counter tandem stance CGA/minA 2x10" bil      Exercises   Exercises  Knee/Hip      Lumbar Exercises: Aerobic   Nustep  level 3 LE only due to lymphedema x 51min      Knee/Hip Exercises: Standing   Knee Flexion  Both;5 reps    Knee Flexion Limitations  2# both hands on counter for stability     Hip Flexion  Both;5 reps    Hip Flexion Limitations  2# weight holding on to counter for stability with 1 hand    Hip Abduction  Both;5 reps    Abduction Limitations  2# 2 hands on counter for stability       Knee/Hip Exercises: Seated   Long Arc Quad  Both;5 reps    Long Arc Quad Weight  2 lbs.       Manual Therapy   Soft tissue mobilization  briefly to cording at antecubital fossa    Passive ROM  to the Rt shoulder to tolerance with radiation prep focus                  PT Long Term Goals - 09/08/19 0904      PT LONG TERM GOAL #1   Title  Pt will demonstrate 155 degrees of R shoulder flexion to allow her to reach overhead.    Baseline  137 at eval,  on 09/04/2019 is is 158    Time  4    Period  Weeks    Status  Achieved      PT LONG TERM GOAL #2   Title  Pt will demonstrates 160 degrees of R shoulder abduction to allow her to reach out to the side.    Baseline  122 on eval, on 09/04/2019 it is  160    Time  4    Period  Weeks    Status  Achieved      PT LONG TERM GOAL #3   Title  Pt will have no palpable cording in R antecubital fossa to allow her to reach overhead without discomfort.    Baseline  numerous cords palpable on eval, on 11/30/ 2020 cord ar palpable but less, 09/08/19- some cording palpable    Time  4    Period  Weeks    Status  On-going      PT LONG TERM GOAL #4   Title  Pt will be independent in a home exercise program for continued strengthening and stretching.    Time  4    Period  Weeks    Status  On-going      PT LONG TERM GOAL #5   Title  Pt will report a 75% improvement in tightness across R pec muscle to allow pt to move arm without discomfort.    Baseline  09/04/2019 pt states she cannot feel the tightness today    Time  4    Period  Weeks    Status  Achieved      Additional Long Term Goals   Additional Long Term Goals  Yes      PT LONG TERM GOAL #6   Title  Pt will be able to walk through the grocery store without any seated recovery periods or having to leave due to fatigue.    Baseline  pt had to leave due to fatigue  Time  4    Period  Weeks    Status  New    Target Date  10/06/19      PT LONG TERM GOAL #7   Title  Pt will demonstrate 4/5 hip flexor strength to decrease fall risk.    Baseline  R 3/5, L 3/5    Time  4     Period  Weeks    Status  New    Target Date  10/06/19      PT LONG TERM GOAL #8   Title  Pt will be able to complete 13 reps of sit to stands in 30 seconds to decrease fall risk.    Baseline  3 reps without use of hands    Time  4    Period  Weeks    Status  New    Target Date  10/06/19      PT LONG TERM GOAL  #9   TITLE  Pt will be able to stand on R and L leg in SLS for 12 seconds bilaterally.    Baseline  R 2 seconds, L 7 seconds    Time  4    Period  Weeks    Status  New    Target Date  10/06/19            Plan - 09/11/19 1353    Clinical Impression Statement  Started some LE endurance and balance work today.  Pt tolerated all well but was surprised how it was difficult. Pt does want to continue working on the Rt UE to decrease the pulling so we will continue with this.  She should start radiation next week. No LOB with activiites today but tandem stance required CGA/minA    PT Frequency  2x / week    PT Duration  4 weeks    PT Treatment/Interventions  ADLs/Self Care Home Management;Therapeutic activities;Therapeutic exercise;Patient/family education;Manual lymph drainage;Manual techniques;Compression bandaging;Scar mobilization;Passive range of motion;Taping;Joint Manipulations;Neuromuscular re-education;Gait training;Balance training    PT Next Visit Plan  begin LE strengthening and balance exercises, assess indep with supine scap, Cont STM and myofascial to cording in R antecubital fossa, AAROM exercises and PROM to R shoulder, R pec stretches, scar mobilization, eventually give strength ABC program    PT Home Exercise Plan  post op breast, supine dowel exercises, supine scap    Consulted and Agree with Plan of Care  Patient       Patient will benefit from skilled therapeutic intervention in order to improve the following deficits and impairments:     Visit Diagnosis: Muscle weakness (generalized)  Difficulty in walking, not elsewhere classified  Stiffness of  right shoulder, not elsewhere classified  Pain in right upper arm     Problem List Patient Active Problem List   Diagnosis Date Noted  . Port-A-Cath in place 07/11/2019  . Malignant neoplasm of upper-inner quadrant of right breast in female, estrogen receptor positive ([place]) 04/19/2019  . Facial weakness 11/07/2018  . 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 History of hysterectomy 10/12/2018  . History of bilateral salpingo-oophorectomy (BSO) 10/12/2018  . Osteoarthritis of right knee 05/12/2013  . HYPERLIPIDEMIA 12/30/2007  . HYPERTENSION 12/30/2007  . INSOMNIA 12/30/2007  . HYPERSOMNIA 12/30/2007  . DM w/o Complication Type II [id number]  . HYPERCHOLESTEROLEMIA 12/16/2007  . EXOGENOUS OBESITY 12/16/2007  . SLEEP APNEA, OBSTRUCTIVE 12/16/2007  . ALLERGIC RHINITIS 12/16/2007    Stark Bray 09/11/2019, 1:55 PM  [facility]  [place], [name], [address] Phone: [phone]   Fax:  [phone]  Name: Kathy Reese MRN: [id number] Date of Birth: 02/28/1951

## 2019-09-12 ENCOUNTER — Other Ambulatory Visit: Payer: Medicare Other

## 2019-09-12 ENCOUNTER — Ambulatory Visit: Payer: Medicare Other | Admitting: Hematology and Oncology

## 2019-09-12 ENCOUNTER — Ambulatory Visit: Payer: Medicare Other

## 2019-09-12 ENCOUNTER — Encounter: Payer: Self-pay | Admitting: *Deleted

## 2019-09-13 ENCOUNTER — Other Ambulatory Visit: Payer: Self-pay | Admitting: *Deleted

## 2019-09-13 DIAGNOSIS — Z5181 Encounter for therapeutic drug level monitoring: Secondary | ICD-10-CM

## 2019-09-14 ENCOUNTER — Ambulatory Visit: Payer: Medicare Other | Admitting: Radiation Oncology

## 2019-09-14 ENCOUNTER — Other Ambulatory Visit: Payer: Self-pay | Admitting: *Deleted

## 2019-09-14 MED ORDER — AMOXICILLIN 500 MG PO CAPS: 2000.0000 mg | ORAL_CAPSULE | Freq: Once | ORAL | 0 refills | Status: AC

## 2019-09-14 NOTE — Progress Notes (Signed)
Pt called to report she will be having oral surgery to have a few teeth extracted and a bridge placed.  Per MD okay to have procedure done.  Verbal orders received for pt to receive 2 grams of amoxicillin p.o the morning of her procedure.  Prescription called in and pt verbalized understanding.

## 2019-09-15 ENCOUNTER — Ambulatory Visit: Payer: Medicare Other | Admitting: Rehabilitation

## 2019-09-15 ENCOUNTER — Other Ambulatory Visit: Payer: Self-pay

## 2019-09-15 ENCOUNTER — Encounter: Payer: Self-pay | Admitting: Rehabilitation

## 2019-09-15 DIAGNOSIS — R262 Difficulty in walking, not elsewhere classified: Secondary | ICD-10-CM

## 2019-09-15 DIAGNOSIS — M6281 Muscle weakness (generalized): Secondary | ICD-10-CM

## 2019-09-15 DIAGNOSIS — M79621 Pain in right upper arm: Secondary | ICD-10-CM

## 2019-09-15 DIAGNOSIS — M25611 Stiffness of right shoulder, not elsewhere classified: Secondary | ICD-10-CM

## 2019-09-15 NOTE — Therapy (Signed)
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 Allergic rhinitis, cause unspecified   . Arthritis   . Depression   . Diverticulosis   . History of Bell's palsy   . Hypersomnia   . OSA on CPAP    not using at this time 06/15/19  . Other and unspecified hyperlipidemia   . Personal history of malignant neoplasm of ovary   . Type II or unspecified type diabetes mellitus without mention of complication, not stated as uncontrolled   . Unspecified essential hypertension   . Vertigo     Past Surgical History:  Procedure Laterality Date  . ABDOMINAL ADHESION SURGERY  12/96   exc peritoneal cyst  . BREAST BIOPSY Left 2000   stereotactic, negative  . BREAST LUMPECTOMY WITH RADIOACTIVE SEED AND SENTINEL LYMPH NODE BIOPSY Right 05/30/2019   Procedure: RIGHT BREAST LUMPECTOMY WITH BRACKETED RADIOACTIVE SEEDS  AND SENTINEL LYMPH NODE BIOPSY;  Surgeon: Stark Klein, MD;  Location: [place];   Service: General;  Laterality: Right;  . BREAST REDUCTION SURGERY Bilateral 10/03  . EYE SURGERY Left    cataracts  . LAPAROSCOPIC SALPINGO OOPHERECTOMY Right 12/96  . 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 LAPAROSCOPY ABDOMEN DIAGNOSTIC  2/08   w/LOA, LSO  . PORTACATH PLACEMENT Left 06/16/2019   Procedure: INSERTION PORT-A-CATH;  Surgeon: Stark Klein, MD;  Location: [place];  Service: General;  Laterality: Left;  . REDUCTION MAMMAPLASTY Bilateral 2004  . TONSILLECTOMY    . TOTAL ABDOMINAL HYSTERECTOMY W/ BILATERAL SALPINGOOPHORECTOMY  5/94   secondary to uterine fibroids  . TOTAL KNEE ARTHROPLASTY Right 05/12/2013   Procedure: TOTAL KNEE ARTHROPLASTY;  Surgeon: Alta Corning, MD;  Location: [facility];  Service: Orthopedics;  Laterality: Right;  . TRIGGER FINGER RELEASE  07/06/2012   Procedure: RELEASE TRIGGER FINGER/A-1 PULLEY;  Surgeon: Alta Corning, MD;  Location: [place];  Service: Orthopedics;  Laterality: Left;  left ring finger  . TUBAL LIGATION      There were no vitals filed for this visit.  Subjective Assessment - 09/15/19 1009    Subjective  My bra is very comfortable I still haven't gotten my sleeve yet.  I am supposed to get some oral surgery maybe before the radiation. I was tired after last time but no sore  Pertinent History  R breast cancer, s/p R lumpectomy and SLNB on 05/30/19, 3 nodes removed all negative, currently undergoing chemo, pt will begin radiation after chemo    Currently in Pain?  No/denies                       [facility] Adult PT Treatment/Exercise - 09/15/19 0001      Transfers   Comments  sit to stand from the chair x 5: 22.71 seconds      High Level Balance   High Level Balance Comments  in parallel bars: step up on blue foam x 10 bil CGA no hold needed and no LOB, then balance on the foam head turns R/L x 10 and up and down x 10 CGA without LOB, then EC on the foam  CGA/minA for slight LOB posteriorly. No hand hold needed by pt.       Lumbar Exercises: Aerobic   Nustep  level 3 LE only due to lymphedema x 19min      Knee/Hip Exercises: Standing   Knee Flexion  Both;10 reps    Knee Flexion Limitations  2# at treadmill bars    Hip Flexion  Both;10 reps    Hip Flexion Limitations  2# at treadmill    Hip Abduction  Both;10 reps    Abduction Limitations  2# treadmill bars held      Knee/Hip Exercises: Seated   Long Arc Quad  Both;10 reps    Long Arc Quad Weight  2 lbs.      Shoulder Exercises: Pulleys   Flexion  2 minutes      Shoulder Exercises: Therapy Ball   Flexion  Both;10 reps                  PT Long Term Goals - 09/15/19 1034      PT LONG TERM GOAL #1   Title  Pt will demonstrate 155 degrees of R shoulder flexion to allow her to reach overhead.    Baseline  137 at eval,  on 09/04/2019 is is 158    Status  Achieved      PT LONG TERM GOAL #2   Title  Pt will demonstrates 160 degrees of R shoulder abduction to allow her to reach out to the side.    Baseline  122 on eval, on 09/04/2019 it is  160    Status  Achieved      PT LONG TERM GOAL #3   Title  Pt will have no palpable cording in R antecubital fossa to allow her to reach overhead without discomfort.    Baseline  numerous cords palpable on eval, on 11/30/ 2020 cord ar palpable but less, 09/08/19- some cording palpable    Status  On-going      PT LONG TERM GOAL #4   Title  Pt will be independent in a home exercise program for continued strengthening and stretching.    Status  On-going      PT LONG TERM GOAL #5   Title  Pt will report a 75% improvement in tightness across R pec muscle to allow pt to move arm without discomfort.    Status  Achieved      PT LONG TERM GOAL #6   Title  Pt will be able to walk through the grocery store without any seated recovery periods or having to leave due to fatigue.    Status  New      PT LONG  TERM GOAL #7   Title  Pt will  demonstrate 4/5 hip flexor strength to decrease fall risk.    Status  On-going      PT LONG TERM GOAL #8   Title  Pt will be able to complete 13 reps of sit to stands in 30 seconds to decrease fall risk.    Status  On-going      PT LONG TERM GOAL  #9   TITLE  Pt will be able to stand on R and L leg in SLS for 12 seconds bilaterally.    Status  On-going            Plan - 09/15/19 1053    Clinical Impression Statement  Pt tolerated LE and balance work well and was able and willing to increase reps and activity again today.  She still demonstrates fatigue after LE strengthening after about 6-8 minutes needing a small rest breast.  Her sit to stand has already improved as she has been working on this at home.  eyes closed on unstable surface was difficult.    PT Frequency  2x / week    PT Duration  4 weeks    PT Treatment/Interventions  ADLs/Self Care Home Management;Therapeutic activities;Therapeutic exercise;Patient/family education;Manual lymph drainage;Manual techniques;Compression bandaging;Scar mobilization;Passive range of motion;Taping;Joint Manipulations;Neuromuscular re-education;Gait training;Balance training    PT Next Visit Plan  Cont STM and myofascial to cording in R antecubital fossa PRN, AAROM exercises to R shoulder, R pec stretches, scar mobilization, eventually give strength ABC program    PT Home Exercise Plan  post op breast, supine dowel exercises, supine scap    Consulted and Agree with Plan of Care  Patient       Patient will benefit from skilled therapeutic intervention in order to improve the following deficits and impairments:     Visit Diagnosis: Muscle weakness (generalized)  Difficulty in walking, not elsewhere classified  Stiffness of right shoulder, not elsewhere classified  Pain in right upper arm     Problem List Patient Active Problem List   Diagnosis Date Noted  . Port-A-Cath in place 07/11/2019  . Malignant neoplasm of upper-inner  quadrant of right breast in female, estrogen receptor positive ([place]) 04/19/2019  . Facial weakness 11/07/2018  . 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 History of hysterectomy 10/12/2018  . History of bilateral salpingo-oophorectomy (BSO) 10/12/2018  . Osteoarthritis of right knee 05/12/2013  . HYPERLIPIDEMIA 12/30/2007  . HYPERTENSION 12/30/2007  . INSOMNIA 12/30/2007  . HYPERSOMNIA 12/30/2007  . DM w/o Complication Type II [id number]  . HYPERCHOLESTEROLEMIA 12/16/2007  . EXOGENOUS OBESITY 12/16/2007  . SLEEP APNEA, OBSTRUCTIVE 12/16/2007  . ALLERGIC RHINITIS 12/16/2007    Stark Bray 09/15/2019, 10:56 AM  [place], [name], [address] Phone: [phone]   Fax:  [phone]  Name: Prabhleen Tish MRN: [id number] Date of Birth: 11-Aug-1951

## 2019-09-18 ENCOUNTER — Ambulatory Visit
Admission: RE | Admit: 2019-09-18 | Discharge: 2019-09-18 | Disposition: A | Discharge status: Home / Self Care | Payer: Medicare Other | Source: Ambulatory Visit | Attending: Radiation Oncology | Admitting: Radiation Oncology

## 2019-09-18 ENCOUNTER — Encounter: Payer: Self-pay | Admitting: *Deleted

## 2019-09-18 ENCOUNTER — Other Ambulatory Visit: Payer: Self-pay

## 2019-09-18 DIAGNOSIS — C50211 Malignant neoplasm of upper-inner quadrant of right female breast: Secondary | ICD-10-CM | POA: Diagnosis present

## 2019-09-18 DIAGNOSIS — Z170 Estrogen receptor positive status [ER+]: Secondary | ICD-10-CM | POA: Diagnosis not present

## 2019-09-18 DIAGNOSIS — Z510 Encounter for antineoplastic radiation therapy: Secondary | ICD-10-CM | POA: Insufficient documentation

## 2019-09-18 NOTE — Progress Notes (Signed)
Received call from pt requesting recent office note from Dr. Lindi Adie be sent to [facility] oral, implant, and facial cosmetic surgery center [phone]).  Recent office note faxed to Christa at [phone].

## 2019-09-18 NOTE — Progress Notes (Signed)
  Radiation Oncology         [phone] ________________________________  Name: Kathy Reese MRN: [id number]  Date: 09/18/2019  DOB: 22-Dec-1950  SIMULATION AND TREATMENT PLANNING NOTE    ICD-10-CM   1. Malignant neoplasm of upper-inner quadrant of right breast in female, estrogen receptor positive ([place])  C50.211    Z17.0     DIAGNOSIS: Stage IA, (pT1b, pN0, cM0) Right Breast UIQ, Invasive Ductal Carcinoma with DCIS, ER+ / PR- / Her2+, Grade 3  NARRATIVE:  [facility].  Identity was confirmed.  All relevant records and images related to the planned course of therapy were reviewed.  The patient freely provided informed written consent to proceed with treatment after reviewing the details related to the planned course of therapy. The consent form was witnessed and verified by the simulation staff.  Then, the patient was set-up in a stable reproducible  supine position for radiation therapy.  CT images were obtained.  Surface markings were placed.  The CT images were loaded into the planning software.  Then the target and avoidance structures were contoured.  Treatment planning then occurred.  The radiation prescription was entered and confirmed.  Then, I designed and supervised the construction of a total of 5 medically necessary complex treatment devices.  I have requested : 3D Simulation  I have requested a DVH of the following structures:, Lungs, lumpectomy cavity, heart.  I have ordered:CBC  PLAN:  The patient will receive 40.05 Gy in 15 fractions directed to the right breast followed by a boost to the lumpectomy cavity of 10 Gray in 5 fractions.     Optical Surface Tracking Plan:  Since intensity modulated radiotherapy (IMRT) and 3D conformal radiation treatment methods are predicated on accurate and precise positioning for treatment, intrafraction motion monitoring is medically necessary to ensure accurate and safe treatment  delivery.  The ability to quantify intrafraction motion without excessive ionizing radiation dose can only be performed with optical surface tracking. Accordingly, surface imaging offers the opportunity to obtain 3D measurements of patient position throughout IMRT and 3D treatments without excessive radiation exposure.  I am ordering optical surface tracking for this patient's upcoming course of radiotherapy. ________________________________   Blair Promise, PhD, MD  This document serves as a record of services personally performed by Gery Pray, MD. It was created on his behalf by Clerance Lav, a trained medical scribe. The creation of this record is based on the scribe's personal observations and the provider's statements to them. This document has been checked and approved by the attending provider.

## 2019-09-19 ENCOUNTER — Inpatient Hospital Stay: Payer: Medicare Other

## 2019-09-19 ENCOUNTER — Ambulatory Visit (HOSPITAL_COMMUNITY)
Admission: RE | Admit: 2019-09-19 | Discharge: 2019-09-19 | Disposition: A | Discharge status: Home / Self Care | Payer: Medicare Other | Source: Ambulatory Visit | Attending: Hematology and Oncology | Admitting: Hematology and Oncology

## 2019-09-19 ENCOUNTER — Other Ambulatory Visit: Payer: Self-pay

## 2019-09-19 ENCOUNTER — Encounter: Payer: Self-pay | Admitting: *Deleted

## 2019-09-19 ENCOUNTER — Inpatient Hospital Stay: Payer: Medicare Other | Attending: Hematology and Oncology

## 2019-09-19 VITALS — BP 158/48 | HR 66 | Temp 97.9°F | Resp 18 | Wt 201.8 lb

## 2019-09-19 DIAGNOSIS — E119 Type 2 diabetes mellitus without complications: Secondary | ICD-10-CM | POA: Diagnosis not present

## 2019-09-19 DIAGNOSIS — Z5181 Encounter for therapeutic drug level monitoring: Secondary | ICD-10-CM | POA: Diagnosis not present

## 2019-09-19 DIAGNOSIS — Z170 Estrogen receptor positive status [ER+]: Secondary | ICD-10-CM | POA: Insufficient documentation

## 2019-09-19 DIAGNOSIS — E785 Hyperlipidemia, unspecified: Secondary | ICD-10-CM | POA: Insufficient documentation

## 2019-09-19 DIAGNOSIS — C50211 Malignant neoplasm of upper-inner quadrant of right female breast: Secondary | ICD-10-CM | POA: Diagnosis present

## 2019-09-19 DIAGNOSIS — I10 Essential (primary) hypertension: Secondary | ICD-10-CM | POA: Diagnosis not present

## 2019-09-19 DIAGNOSIS — Z5112 Encounter for antineoplastic immunotherapy: Secondary | ICD-10-CM | POA: Insufficient documentation

## 2019-09-19 DIAGNOSIS — Z01810 Encounter for preprocedural cardiovascular examination: Secondary | ICD-10-CM | POA: Insufficient documentation

## 2019-09-19 DIAGNOSIS — Z95828 Presence of other vascular implants and grafts: Secondary | ICD-10-CM

## 2019-09-19 DIAGNOSIS — Z79899 Other long term (current) drug therapy: Secondary | ICD-10-CM | POA: Insufficient documentation

## 2019-09-19 LAB — CMP (CANCER CENTER ONLY)
ALT: 32 U/L (ref 0–44)
AST: 31 U/L (ref 15–41)
Albumin: 3.8 g/dL (ref 3.5–5.0)
Alkaline Phosphatase: 121 U/L (ref 38–126)
Anion gap: 10 (ref 5–15)
BUN: 24 mg/dL — ABNORMAL HIGH (ref 8–23)
CO2: 28 mmol/L (ref 22–32)
Calcium: 9.8 mg/dL (ref 8.9–10.3)
Chloride: 103 mmol/L (ref 98–111)
Creatinine: 0.91 mg/dL (ref 0.44–1.00)
GFR, Est AFR Am: >60 mL/min (ref >60)
GFR, Estimated: >60 mL/min (ref >60)
Glucose, Bld: 116 mg/dL — ABNORMAL HIGH (ref 70–99)
Potassium: 3.9 mmol/L (ref 3.5–5.1)
Sodium: 141 mmol/L (ref 135–145)
Total Bilirubin: 0.8 mg/dL (ref 0.3–1.2)
Total Protein: 7.3 g/dL (ref 6.5–8.1)

## 2019-09-19 LAB — CBC WITH DIFFERENTIAL (CANCER CENTER ONLY)
Abs Immature Granulocytes: 0.01 10*3/uL (ref 0.00–0.07)
Basophils Absolute: 0.1 10*3/uL (ref 0.0–0.1)
Basophils Relative: 1 %
Eosinophils Absolute: 0.4 10*3/uL (ref 0.0–0.5)
Eosinophils Relative: 5 %
HCT: 30.6 % — ABNORMAL LOW (ref 36.0–46.0)
Hemoglobin: 9.6 g/dL — ABNORMAL LOW (ref 12.0–15.0)
Immature Granulocytes: 0 %
Lymphocytes Relative: 27 %
Lymphs Abs: 2.1 10*3/uL (ref 0.7–4.0)
MCH: 29.3 pg (ref 26.0–34.0)
MCHC: 31.4 g/dL (ref 30.0–36.0)
MCV: 93.3 fL (ref 80.0–100.0)
Monocytes Absolute: 0.8 10*3/uL (ref 0.1–1.0)
Monocytes Relative: 10 %
Neutro Abs: 4.4 10*3/uL (ref 1.7–7.7)
Neutrophils Relative %: 57 %
Platelet Count: 228 10*3/uL (ref 150–400)
RBC: 3.28 MIL/uL — ABNORMAL LOW (ref 3.87–5.11)
RDW: 16.3 % — ABNORMAL HIGH (ref 11.5–15.5)
WBC Count: 7.7 10*3/uL (ref 4.0–10.5)
nRBC: 0 % (ref 0.0–0.2)

## 2019-09-19 MED ORDER — SODIUM CHLORIDE 0.9 % IV SOLN
Freq: Once | INTRAVENOUS | Status: AC
  Filled 2019-09-19: qty 250

## 2019-09-19 MED ORDER — SODIUM CHLORIDE 0.9% FLUSH
10.0000 mL | Freq: Once | INTRAVENOUS | Status: AC
  Administered 2019-09-19: 10 mL
  Filled 2019-09-19: qty 10

## 2019-09-19 MED ORDER — TRASTUZUMAB-ANNS CHEMO 150 MG IV SOLR
600.0000 mg | Freq: Once | INTRAVENOUS | Status: AC
  Administered 2019-09-19: 600 mg via INTRAVENOUS
  Filled 2019-09-19: qty 28.57

## 2019-09-19 MED ORDER — DIPHENHYDRAMINE HCL 25 MG PO CAPS
ORAL_CAPSULE | ORAL | Status: AC
  Filled 2019-09-19: qty 2

## 2019-09-19 MED ORDER — ACETAMINOPHEN 325 MG PO TABS
650.0000 mg | ORAL_TABLET | Freq: Once | ORAL | Status: AC
  Administered 2019-09-19: 650 mg via ORAL

## 2019-09-19 MED ORDER — SODIUM CHLORIDE 0.9% FLUSH
10.0000 mL | INTRAVENOUS | Status: DC | PRN
  Administered 2019-09-19: 10 mL
  Filled 2019-09-19: qty 10

## 2019-09-19 MED ORDER — HEPARIN SOD (PORK) LOCK FLUSH 100 UNIT/ML IV SOLN
500.0000 [IU] | Freq: Once | INTRAVENOUS | Status: AC | PRN
  Administered 2019-09-19: 500 [IU]
  Filled 2019-09-19: qty 5

## 2019-09-19 MED ORDER — DIPHENHYDRAMINE HCL 25 MG PO CAPS
50.0000 mg | ORAL_CAPSULE | Freq: Once | ORAL | Status: AC
  Administered 2019-09-19: 50 mg via ORAL

## 2019-09-19 MED ORDER — ACETAMINOPHEN 325 MG PO TABS
ORAL_TABLET | ORAL | Status: AC
  Filled 2019-09-19: qty 2

## 2019-09-19 NOTE — Progress Notes (Signed)
  Echocardiogram 2D Echocardiogram has been performed.  Kathy Reese 09/19/2019, 12:02 PM

## 2019-09-19 NOTE — Progress Notes (Signed)
Per MD okay to treat with pending echo results for today.

## 2019-09-19 NOTE — Patient Instructions (Signed)
[facility] Discharge Instructions for Patients Receiving Chemotherapy  Today you received the following chemotherapy agents trastuzumab.  To help prevent nausea and vomiting after your treatment, we encourage you to take your nausea medication as directed.    If you develop nausea and vomiting that is not controlled by your nausea medication, call the clinic.   BELOW ARE SYMPTOMS THAT SHOULD BE REPORTED IMMEDIATELY:  *FEVER GREATER THAN 100.5 F  *CHILLS WITH OR WITHOUT FEVER  NAUSEA AND VOMITING THAT IS NOT CONTROLLED WITH YOUR NAUSEA MEDICATION  *UNUSUAL SHORTNESS OF BREATH  *UNUSUAL BRUISING OR BLEEDING  TENDERNESS IN MOUTH AND THROAT WITH OR WITHOUT PRESENCE OF ULCERS  *URINARY PROBLEMS  *BOWEL PROBLEMS  UNUSUAL RASH Items with * indicate a potential emergency and should be followed up as soon as possible.  Feel free to call the clinic should you have any questions or concerns. The clinic phone number is [phone].  Please show the CHEMO ALERT CARD at check-in to the Emergency Department and triage nurse.   

## 2019-09-20 ENCOUNTER — Encounter: Payer: Medicare Other | Admitting: Physical Therapy

## 2019-09-21 DIAGNOSIS — Z510 Encounter for antineoplastic radiation therapy: Secondary | ICD-10-CM | POA: Diagnosis not present

## 2019-09-22 ENCOUNTER — Other Ambulatory Visit: Payer: Self-pay

## 2019-09-22 ENCOUNTER — Encounter: Payer: Self-pay | Admitting: Physical Therapy

## 2019-09-22 ENCOUNTER — Ambulatory Visit: Payer: Medicare Other | Admitting: Physical Therapy

## 2019-09-22 DIAGNOSIS — M25611 Stiffness of right shoulder, not elsewhere classified: Secondary | ICD-10-CM | POA: Diagnosis not present

## 2019-09-22 DIAGNOSIS — R262 Difficulty in walking, not elsewhere classified: Secondary | ICD-10-CM

## 2019-09-22 DIAGNOSIS — M6281 Muscle weakness (generalized): Secondary | ICD-10-CM

## 2019-09-22 NOTE — Therapy (Signed)
[place], [name], [address] Phone: [phone]   Fax:  [phone]  Physical Therapy Treatment  Patient Details  Name: Kathy Reese MRN: [id number] Date of Birth: 1951/08/22 Referring Provider (PT): Barry Dienes   Encounter Date: 09/22/2019  PT End of Session - 09/22/19 0954    Visit Number  11    Number of Visits  17    Date for PT Re-Evaluation  10/06/19    PT Start Time  0901    PT Stop Time  0946    PT Time Calculation (min)  45 min    Activity Tolerance  Patient tolerated treatment well    Behavior During Therapy  [facility] for tasks assessed/performed       Past Medical History:  Diagnosis Date  . Allergic rhinitis, cause unspecified   . Arthritis   . Depression   . Diverticulosis   . History of Bell's palsy   . Hypersomnia   . OSA on CPAP    not using at this time 06/15/19  . Other and unspecified hyperlipidemia   . Personal history of malignant neoplasm of ovary   . Type II or unspecified type diabetes mellitus without mention of complication, not stated as uncontrolled   . Unspecified essential hypertension   . Vertigo     Past Surgical History:  Procedure Laterality Date  . ABDOMINAL ADHESION SURGERY  12/96   exc peritoneal cyst  . BREAST BIOPSY Left 2000   stereotactic, negative  . BREAST LUMPECTOMY WITH RADIOACTIVE SEED AND SENTINEL LYMPH NODE BIOPSY Right 05/30/2019   Procedure: RIGHT BREAST LUMPECTOMY WITH BRACKETED RADIOACTIVE SEEDS  AND SENTINEL LYMPH NODE BIOPSY;  Surgeon: Stark Klein, MD;  Location: [facility];  Service: General;  Laterality: Right;  . BREAST REDUCTION SURGERY Bilateral 10/03  . EYE SURGERY Left    cataracts  . LAPAROSCOPIC SALPINGO OOPHERECTOMY Right 12/96  . 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 LAPAROSCOPY ABDOMEN DIAGNOSTIC  2/08   w/LOA, LSO  . PORTACATH PLACEMENT Left 06/16/2019   Procedure:  INSERTION PORT-A-CATH;  Surgeon: Stark Klein, MD;  Location: [facility];  Service: General;  Laterality: Left;  . REDUCTION MAMMAPLASTY Bilateral 2004  . TONSILLECTOMY    . TOTAL ABDOMINAL HYSTERECTOMY W/ BILATERAL SALPINGOOPHORECTOMY  5/94   secondary to uterine fibroids  . TOTAL KNEE ARTHROPLASTY Right 05/12/2013   Procedure: TOTAL KNEE ARTHROPLASTY;  Surgeon: Alta Corning, MD;  Location: [facility];  Service: Orthopedics;  Laterality: Right;  . TRIGGER FINGER RELEASE  07/06/2012   Procedure: RELEASE TRIGGER FINGER/A-1 PULLEY;  Surgeon: Alta Corning, MD;  Location: [facility];  Service: Orthopedics;  Laterality: Left;  left ring finger  . TUBAL LIGATION      There were no vitals filed for this visit.  Subjective Assessment - 09/22/19 0902    Subjective  I finally got my sleeves and I brought them today.    Pertinent History  R breast cancer, s/p R lumpectomy and SLNB on 05/30/19, 3 nodes removed all negative, currently undergoing chemo, pt will begin radiation after chemo    Patient Stated Goals  to relieve that pain and get back to normal    Currently in Pain?  No/denies    Pain Score  0-No pain                       [facility] Adult PT Treatment/Exercise -  09/22/19 0001      High Level Balance   High Level Balance Comments  in corner: step up on blue foam x 10 SBA and no LOB, then balance on the foam head turns R/L x 4 stopped due to pt feeling fatigued and had loss of balance posteriorly that required min A to correct, and up and down x 10 occasional touching on wall for balance with some difficulty, marching x 10 bilaterally with no loss of balance, then EC on the foam CGA/minA for slight LOB anteriorly      Lumbar Exercises: Aerobic   Nustep  level 3 LE only due to lymphedema x 7 min 15 sec      Knee/Hip Exercises: Standing   Hip Flexion  Both;10 reps    Hip Flexion Limitations  2# at treadmill    Hip Abduction  Both;10 reps    Abduction Limitations  2#  treadmill bars held      Knee/Hip Exercises: Seated   Long Arc Quad  Both;10 reps;2 sets    [employer] Weight  2 lbs.    Ball Squeeze  10 times with 3 sec holds       Shoulder Exercises: Pulleys   Flexion  2 minutes    ABduction  2 minutes      Shoulder Exercises: Therapy Ball   Flexion  Both;10 reps      Manual Therapy   Edema Management  Pt brought in her compression sleeve and glove. Demonstrated to pt correct donning/doffing technique. Pt is limited with use of her left hand so will need assistance from daughter for this. Compression sleeve and glove had a good fit. She had no discomfort while wearing it throughout session today.                   PT Long Term Goals - 09/15/19 1034      PT LONG TERM GOAL #1   Title  Pt will demonstrate 155 degrees of R shoulder flexion to allow her to reach overhead.    Baseline  137 at eval,  on 09/04/2019 is is 158    Status  Achieved      PT LONG TERM GOAL #2   Title  Pt will demonstrates 160 degrees of R shoulder abduction to allow her to reach out to the side.    Baseline  122 on eval, on 09/04/2019 it is  160    Status  Achieved      PT LONG TERM GOAL #3   Title  Pt will have no palpable cording in R antecubital fossa to allow her to reach overhead without discomfort.    Baseline  numerous cords palpable on eval, on 11/30/ 2020 cord ar palpable but less, 09/08/19- some cording palpable    Status  On-going      PT LONG TERM GOAL #4   Title  Pt will be independent in a home exercise program for continued strengthening and stretching.    Status  On-going      PT LONG TERM GOAL #5   Title  Pt will report a 75% improvement in tightness across R pec muscle to allow pt to move arm without discomfort.    Status  Achieved      PT LONG TERM GOAL #6   Title  Pt will be able to walk through the grocery store without any seated recovery periods or having to leave due to fatigue.    Status  New  PT LONG TERM GOAL #7    Title  Pt will demonstrate 4/5 hip flexor strength to decrease fall risk.    Status  On-going      PT LONG TERM GOAL #8   Title  Pt will be able to complete 13 reps of sit to stands in 30 seconds to decrease fall risk.    Status  On-going      PT LONG TERM GOAL  #9   TITLE  Pt will be able to stand on R and L leg in SLS for 12 seconds bilaterally.    Status  On-going            Plan - 09/22/19 0954    Clinical Impression Statement  Assesed pt's compression sleeve and glove for proper fit and compression level. Instructed pt in correct donning/doffing technique for sleeve and glove. She is unable to don independently due to limited use of her left hand but her daughter who lives with her can assist. Pt wore sleeve and glove during exercises today with no discomfort. Continued with LE strengthening exercises today. Pt very fatigued throughout and required several seated recovery periods. She had difficulty with head turns and eyes closed on the foam with losses of balance with both. Did these exercises in the corner today instead of the parallel bars to increase challenge.    PT Frequency  2x / week    PT Duration  4 weeks    PT Treatment/Interventions  ADLs/Self Care Home Management;Therapeutic activities;Therapeutic exercise;Patient/family education;Manual lymph drainage;Manual techniques;Compression bandaging;Scar mobilization;Passive range of motion;Taping;Joint Manipulations;Neuromuscular re-education;Gait training;Balance training    PT Next Visit Plan  LE exercises as pt tolerates, NuStep, high level balance, Cont STM and myofascial to cording in R antecubital fossa PRN, AAROM exercises to R shoulder, R pec stretches, scar mobilization, eventually give strength ABC program    PT Home Exercise Plan  post op breast, supine dowel exercises, supine scap    Consulted and Agree with Plan of Care  Patient       Patient will benefit from skilled therapeutic intervention in order to improve  the following deficits and impairments:  Decreased range of motion, Decreased scar mobility, Increased edema, Decreased knowledge of precautions, Decreased strength, Increased fascial restricitons, Impaired UE functional use, Postural dysfunction, Pain, Decreased activity tolerance, Difficulty walking, Decreased balance, Decreased endurance, Decreased mobility  Visit Diagnosis: Muscle weakness (generalized)  Difficulty in walking, not elsewhere classified  Stiffness of right shoulder, not elsewhere classified     Problem List Patient Active Problem List   Diagnosis Date Noted  . Port-A-Cath in place 07/11/2019  . Malignant neoplasm of upper-inner quadrant of right breast in female, estrogen receptor positive ([place]) 04/19/2019  . Facial weakness 11/07/2018  . 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 History of hysterectomy 10/12/2018  . History of bilateral salpingo-oophorectomy (BSO) 10/12/2018  . Osteoarthritis of right knee 05/12/2013  . HYPERLIPIDEMIA 12/30/2007  . HYPERTENSION 12/30/2007  . INSOMNIA 12/30/2007  . HYPERSOMNIA 12/30/2007  . DM w/o Complication Type II [id number]  . HYPERCHOLESTEROLEMIA 12/16/2007  . EXOGENOUS OBESITY 12/16/2007  . SLEEP APNEA, OBSTRUCTIVE 12/16/2007  . ALLERGIC RHINITIS 12/16/2007    Allyson Sabal [facility] 09/22/2019, 10:02 AM  [place], [name], [address] Phone: [phone]   Fax:  [phone]  Name: Kathy Reese MRN: [id number] Date of Birth: [birth year]  Manus Gunning, PT 09/22/19 10:03 AM

## 2019-09-25 ENCOUNTER — Other Ambulatory Visit: Payer: Self-pay

## 2019-09-25 ENCOUNTER — Ambulatory Visit: Payer: Medicare Other | Admitting: Physical Therapy

## 2019-09-25 ENCOUNTER — Ambulatory Visit
Admission: RE | Admit: 2019-09-25 | Discharge: 2019-09-25 | Disposition: A | Discharge status: Home / Self Care | Payer: Medicare Other | Source: Ambulatory Visit | Attending: Radiation Oncology | Admitting: Radiation Oncology

## 2019-09-25 ENCOUNTER — Encounter: Payer: Self-pay | Admitting: Physical Therapy

## 2019-09-25 DIAGNOSIS — C50211 Malignant neoplasm of upper-inner quadrant of right female breast: Secondary | ICD-10-CM

## 2019-09-25 DIAGNOSIS — Z510 Encounter for antineoplastic radiation therapy: Secondary | ICD-10-CM | POA: Diagnosis not present

## 2019-09-25 DIAGNOSIS — M25611 Stiffness of right shoulder, not elsewhere classified: Secondary | ICD-10-CM | POA: Diagnosis not present

## 2019-09-25 DIAGNOSIS — M79621 Pain in right upper arm: Secondary | ICD-10-CM

## 2019-09-25 DIAGNOSIS — R262 Difficulty in walking, not elsewhere classified: Secondary | ICD-10-CM

## 2019-09-25 DIAGNOSIS — M6281 Muscle weakness (generalized): Secondary | ICD-10-CM

## 2019-09-25 DIAGNOSIS — I890 Lymphedema, not elsewhere classified: Secondary | ICD-10-CM

## 2019-09-25 MED ORDER — SONAFINE EX EMUL
1.0000 "application " | Freq: Two times a day (BID) | CUTANEOUS | Status: DC
  Administered 2019-09-25: 1 via TOPICAL

## 2019-09-25 MED ORDER — ALRA NON-METALLIC DEODORANT (RAD-ONC)
1.0000 "application " | Freq: Once | TOPICAL | Status: AC
  Administered 2019-09-25: 1 via TOPICAL

## 2019-09-25 NOTE — Therapy (Signed)
[place], [name], [address] Phone: [phone]   Fax:  [phone]  Physical Therapy Treatment  Patient Details  Name: Kathy Reese MRN: [id number] Date of Birth: 1951-04-02 Referring Provider (PT): Barry Dienes   Encounter Date: 09/25/2019  PT End of Session - 09/25/19 0953    Visit Number  12    Number of Visits  17    Date for PT Re-Evaluation  10/06/19    PT Start Time  0900    PT Stop Time  0954    PT Time Calculation (min)  54 min    Activity Tolerance  Patient tolerated treatment well    Behavior During Therapy  [facility] for tasks assessed/performed       Past Medical History:  Diagnosis Date  . Allergic rhinitis, cause unspecified   . Arthritis   . Depression   . Diverticulosis   . History of Bell's palsy   . Hypersomnia   . OSA on CPAP    not using at this time 06/15/19  . Other and unspecified hyperlipidemia   . Personal history of malignant neoplasm of ovary   . Type II or unspecified type diabetes mellitus without mention of complication, not stated as uncontrolled   . Unspecified essential hypertension   . Vertigo     Past Surgical History:  Procedure Laterality Date  . ABDOMINAL ADHESION SURGERY  12/96   exc peritoneal cyst  . BREAST BIOPSY Left 2000   stereotactic, negative  . BREAST LUMPECTOMY WITH RADIOACTIVE SEED AND SENTINEL LYMPH NODE BIOPSY Right 05/30/2019   Procedure: RIGHT BREAST LUMPECTOMY WITH BRACKETED RADIOACTIVE SEEDS  AND SENTINEL LYMPH NODE BIOPSY;  Surgeon: Stark Klein, MD;  Location: [place];  Service: General;  Laterality: Right;  . BREAST REDUCTION SURGERY Bilateral 10/03  . EYE SURGERY Left    cataracts  . LAPAROSCOPIC SALPINGO OOPHERECTOMY Right 12/96  . 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 LAPAROSCOPY ABDOMEN DIAGNOSTIC  2/08   w/LOA, LSO  . PORTACATH PLACEMENT Left 06/16/2019   Procedure:  INSERTION PORT-A-CATH;  Surgeon: Stark Klein, MD;  Location: [facility];  Service: General;  Laterality: Left;  . REDUCTION MAMMAPLASTY Bilateral 2004  . TONSILLECTOMY    . TOTAL ABDOMINAL HYSTERECTOMY W/ BILATERAL SALPINGOOPHORECTOMY  5/94   secondary to uterine fibroids  . TOTAL KNEE ARTHROPLASTY Right 05/12/2013   Procedure: TOTAL KNEE ARTHROPLASTY;  Surgeon: Alta Corning, MD;  Location: [facility];  Service: Orthopedics;  Laterality: Right;  . TRIGGER FINGER RELEASE  07/06/2012   Procedure: RELEASE TRIGGER FINGER/A-1 PULLEY;  Surgeon: Alta Corning, MD;  Location: [place];  Service: Orthopedics;  Laterality: Left;  left ring finger  . TUBAL LIGATION      There were no vitals filed for this visit.  Subjective Assessment - 09/25/19 0907    Subjective  Pt says she was not able to get her sleeve on by herself. She feels like her walking is doing ok. the cording in her right elbow is still present and she wants to work on that today.  She starts radiation today    Pertinent History  R breast cancer, s/p R lumpectomy and SLNB on 05/30/19, 3 nodes removed all negative, currently undergoing chemo, pt will begin radiation after chemo    Patient Stated Goals  to relieve that pain and get back to normal    Currently in Pain?  No/denies  [facility] Adult PT Treatment/Exercise - 09/25/19 0001      Shoulder Exercises: Supine   Other Supine Exercises  dowel rod flexion, abduction and butterfly stretch       Manual Therapy   Manual Therapy  Edema management;Soft tissue mobilization;Manual Lymphatic Drainage (MLD)    Manual therapy comments  pt states she comperession sleeve it tight.  showed her how to apply it with Docia Furl and she was able to do it by herself with only minor assist at the top. Gave  her information to order one from [facility] tissue mobilization  tissue bending and mobilizaoin at antecubital fossa and axilla around cording.      Manual Lymphatic Drainage (MLD)  Perfformed  In supine: short neck, deep diaphragmatic breathes 5x, Bil axillary nodes, Bil inguinal nodes, Anterior inter-axillary anastomosis, R axillo-inguinal anastomosis, medial/lateral brachium, lateral brachium, R shoulder, re-worked all surfaces followed by 5 deep breathes.     Passive ROM  in supine to R shoulder in direction of flexion and abduction with prolonged holds             PT Education - 09/25/19 0958    Education Details  how to apply compression sleeve with Medi butler, supine dowel stretches, exercises on www. [employer] for tai chi and silver stretches and strengthening    Person(s) Educated  Patient    Methods  Explanation;Handout;Demonstration    Comprehension  Verbalized understanding;Returned demonstration          PT Long Term Goals - 09/15/19 1034      PT LONG TERM GOAL #1   Title  Pt will demonstrate 155 degrees of R shoulder flexion to allow her to reach overhead.    Baseline  137 at eval,  on 09/04/2019 is is 158    Status  Achieved      PT LONG TERM GOAL #2   Title  Pt will demonstrates 160 degrees of R shoulder abduction to allow her to reach out to the side.    Baseline  122 on eval, on 09/04/2019 it is  160    Status  Achieved      PT LONG TERM GOAL #3   Title  Pt will have no palpable cording in R antecubital fossa to allow her to reach overhead without discomfort.    Baseline  numerous cords palpable on eval, on 11/30/ 2020 cord ar palpable but less, 09/08/19- some cording palpable    Status  On-going      PT LONG TERM GOAL #4   Title  Pt will be independent in a home exercise program for continued strengthening and stretching.    Status  On-going      PT LONG TERM GOAL #5   Title  Pt will report a 75% improvement in tightness across R pec muscle to allow pt to move arm without discomfort.    Status  Achieved      PT LONG TERM GOAL #6   Title  Pt will be able to walk through the grocery store  without any seated recovery periods or having to leave due to fatigue.    Status  New      PT LONG TERM GOAL #7   Title  Pt will demonstrate 4/5 hip flexor strength to decrease fall risk.    Status  On-going      PT LONG TERM GOAL #8   Title  Pt will be able to complete 13 reps of sit  to stands in 30 seconds to decrease fall risk.    Status  On-going      PT LONG TERM GOAL  #9   TITLE  Pt will be able to stand on R and L leg in SLS for 12 seconds bilaterally.    Status  On-going            Plan - 09/25/19 0959    Clinical Impression Statement  Pt is having difficulty with compression sleeve donning and feels it is too tight.  She was able to put it on with Medi butler and will check to  see if it helps decrease her cording when she takes it off today.  will send a message to [facility] to see if Medicare will pay for compressio bras as she wants another one and to alert her that sleeve may need a remake    Comorbidities  diabetes, ovarian cancer, caregiver for husband with dementia, sister brings her to appts    PT Next Visit Plan  continue working with cordking and right arm lymphedema, Once that is stable, conti LE exercises as pt tolerates, NuStep, high level balance, Cont STM and myofascial to cording in R antecubital fossa PRN, AAROM exercises to R shoulder, R pec stretches, scar mobilization, eventually give strength ABC program    Consulted and Agree with Plan of Care  Patient       Patient will benefit from skilled therapeutic intervention in order to improve the following deficits and impairments:  Decreased range of motion, Decreased scar mobility, Increased edema, Decreased knowledge of precautions, Decreased strength, Increased fascial restricitons, Impaired UE functional use, Postural dysfunction, Pain, Decreased activity tolerance, Difficulty walking, Decreased balance, Decreased endurance, Decreased mobility  Visit Diagnosis: Muscle weakness (generalized)  Difficulty in  walking, not elsewhere classified  Stiffness of right shoulder, not elsewhere classified  Pain in right upper arm  Lymphedema, not elsewhere classified     Problem List Patient Active Problem List   Diagnosis Date Noted  . Port-A-Cath in place 07/11/2019  . Malignant neoplasm of upper-inner quadrant of right breast in female, estrogen receptor positive ([place]) 04/19/2019  . Facial weakness 11/07/2018  . 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 History of hysterectomy 10/12/2018  . History of bilateral salpingo-oophorectomy (BSO) 10/12/2018  . Osteoarthritis of right knee 05/12/2013  . HYPERLIPIDEMIA 12/30/2007  . HYPERTENSION 12/30/2007  . INSOMNIA 12/30/2007  . HYPERSOMNIA 12/30/2007  . DM w/o Complication Type II 16/07/9603  . HYPERCHOLESTEROLEMIA 12/16/2007  . EXOGENOUS OBESITY 12/16/2007  . SLEEP APNEA, OBSTRUCTIVE 12/16/2007  . ALLERGIC RHINITIS 12/16/2007   Donato Heinz. Owens Shark PT  Norwood Levo 09/25/2019, 10:03 AM  [place], [name], [address] Phone: [phone]   Fax:  [phone]  Name: Kathy Reese MRN: [id number] Date of Birth: 1951/06/12

## 2019-09-25 NOTE — Patient Instructions (Signed)
SHOULDER: Flexion - Supine (Cane)        Cancer Rehab [phone]    Hold cane in both hands. Raise arms up overhead. Do not allow back to arch. Hold _5__ seconds. Do __5-10__ times; __1-2__ times a day.   SELF ASSISTED WITH OBJECT: Shoulder Abduction / Adduction - Supine    Hold cane with both hands. Move both arms from side to side, keep elbows straight.  Hold when stretch felt for __5__ seconds. Repeat __5-10__ times; __1-2__ times a day. Once this becomes easier progress to third picture bringing affected arm towards ear by staying out to side. Same hold for _5_seconds. Repeat  _5-10_ times, _1-2_ times/day.  Shoulder Blade Stretch    Clasp fingers behind head with elbows touching in front of face. Pull elbows back while pressing shoulder blades together. Relax and hold as tolerated, can place pillow under elbow here for comfort as needed and to allow for prolonged stretch.  Repeat __5__ times. Do __1-2__ sessions per day.      [email or web address] Categories Sitting yoga, etc

## 2019-09-26 ENCOUNTER — Ambulatory Visit: Payer: Medicare Other

## 2019-09-26 ENCOUNTER — Other Ambulatory Visit: Payer: Medicare Other

## 2019-09-26 ENCOUNTER — Other Ambulatory Visit: Payer: Self-pay

## 2019-09-26 ENCOUNTER — Ambulatory Visit: Payer: Medicare Other | Admitting: Hematology and Oncology

## 2019-09-26 ENCOUNTER — Ambulatory Visit
Admission: RE | Admit: 2019-09-26 | Discharge: 2019-09-26 | Disposition: A | Discharge status: Home / Self Care | Payer: Medicare Other | Source: Ambulatory Visit | Attending: Radiation Oncology | Admitting: Radiation Oncology

## 2019-09-26 DIAGNOSIS — Z510 Encounter for antineoplastic radiation therapy: Secondary | ICD-10-CM | POA: Diagnosis not present

## 2019-09-27 ENCOUNTER — Ambulatory Visit: Payer: Medicare Other | Admitting: Physical Therapy

## 2019-09-27 ENCOUNTER — Other Ambulatory Visit: Payer: Self-pay

## 2019-09-27 ENCOUNTER — Ambulatory Visit
Admission: RE | Admit: 2019-09-27 | Discharge: 2019-09-27 | Disposition: A | Discharge status: Home / Self Care | Payer: Medicare Other | Source: Ambulatory Visit | Attending: Radiation Oncology | Admitting: Radiation Oncology

## 2019-09-27 DIAGNOSIS — Z510 Encounter for antineoplastic radiation therapy: Secondary | ICD-10-CM | POA: Diagnosis not present

## 2019-09-27 DIAGNOSIS — M25611 Stiffness of right shoulder, not elsewhere classified: Secondary | ICD-10-CM | POA: Diagnosis not present

## 2019-09-27 DIAGNOSIS — I890 Lymphedema, not elsewhere classified: Secondary | ICD-10-CM

## 2019-09-27 DIAGNOSIS — R262 Difficulty in walking, not elsewhere classified: Secondary | ICD-10-CM

## 2019-09-27 DIAGNOSIS — M79621 Pain in right upper arm: Secondary | ICD-10-CM

## 2019-09-27 DIAGNOSIS — M6281 Muscle weakness (generalized): Secondary | ICD-10-CM

## 2019-09-27 NOTE — Therapy (Signed)
[place], [name], [address] Phone: [phone]   Fax:  [phone]  Physical Therapy Treatment  Patient Details  Name: Kathy Reese MRN: [id number] Date of Birth: 1951-01-06 Referring Provider (PT): Barry Dienes   Encounter Date: 09/27/2019  PT End of Session - 09/27/19 1429    Visit Number  13    Number of Visits  17    Date for PT Re-Evaluation  10/06/19    PT Start Time  1100    PT Stop Time  1145    PT Time Calculation (min)  45 min    Activity Tolerance  Patient tolerated treatment well    Behavior During Therapy  [facility] for tasks assessed/performed       Past Medical History:  Diagnosis Date  . Allergic rhinitis, cause unspecified   . Arthritis   . Depression   . Diverticulosis   . History of Bell's palsy   . Hypersomnia   . OSA on CPAP    not using at this time 06/15/19  . Other and unspecified hyperlipidemia   . Personal history of malignant neoplasm of ovary   . Type II or unspecified type diabetes mellitus without mention of complication, not stated as uncontrolled   . Unspecified essential hypertension   . Vertigo     Past Surgical History:  Procedure Laterality Date  . ABDOMINAL ADHESION SURGERY  12/96   exc peritoneal cyst  . BREAST BIOPSY Left 2000   stereotactic, negative  . BREAST LUMPECTOMY WITH RADIOACTIVE SEED AND SENTINEL LYMPH NODE BIOPSY Right 05/30/2019   Procedure: RIGHT BREAST LUMPECTOMY WITH BRACKETED RADIOACTIVE SEEDS  AND SENTINEL LYMPH NODE BIOPSY;  Surgeon: Stark Klein, MD;  Location: [facility];  Service: General;  Laterality: Right;  . BREAST REDUCTION SURGERY Bilateral 10/03  . EYE SURGERY Left    cataracts  . LAPAROSCOPIC SALPINGO OOPHERECTOMY Right 12/96  . 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 LAPAROSCOPY ABDOMEN DIAGNOSTIC  2/08   w/LOA, LSO  . PORTACATH PLACEMENT Left 06/16/2019   Procedure:  INSERTION PORT-A-CATH;  Surgeon: Stark Klein, MD;  Location: [facility];  Service: General;  Laterality: Left;  . REDUCTION MAMMAPLASTY Bilateral 2004  . TONSILLECTOMY    . TOTAL ABDOMINAL HYSTERECTOMY W/ BILATERAL SALPINGOOPHORECTOMY  5/94   secondary to uterine fibroids  . TOTAL KNEE ARTHROPLASTY Right 05/12/2013   Procedure: TOTAL KNEE ARTHROPLASTY;  Surgeon: Alta Corning, MD;  Location: [facility];  Service: Orthopedics;  Laterality: Right;  . TRIGGER FINGER RELEASE  07/06/2012   Procedure: RELEASE TRIGGER FINGER/A-1 PULLEY;  Surgeon: Alta Corning, MD;  Location: [facility];  Service: Orthopedics;  Laterality: Left;  left ring finger  . TUBAL LIGATION      There were no vitals filed for this visit.  Subjective Assessment - 09/27/19 1426    Subjective  Pt reports she noticed an improvement in her cording after wearing the compression sleeve. She will put it on again tomorrow when her daughter comes to help her get it on.  Radiation went well and will continue til Jan 19. She has to have dental work next week and has alot of appointments. "Its alot"    Pertinent History  R breast cancer, s/p R lumpectomy and SLNB on 05/30/19, 3 nodes removed all negative, currently undergoing chemo, pt will begin radiation after chemo    Patient Stated Goals  to relieve that pain and  get back to normal    Currently in Pain?  No/denies         [facility] PT Assessment - 09/27/19 0001      AROM   Right Shoulder Flexion  168 Degrees    Right Shoulder ABduction  165 Degrees    Right Shoulder External Rotation  85 Degrees                   [facility] Adult PT Treatment/Exercise - 09/27/19 0001      Self-Care   Self-Care  Other Self-Care Comments    Other Self-Care Comments   pt given information about [employer] and a copy of her prescription to get another compression bra       Manual Therapy   Edema Management  medium tg soft with thick fold at upper arm to provide support to  lymphatic system     Soft tissue mobilization  with thick massage cream to cording and tight tissue in right antecubital fossa, lower upper arm and upper lower arm.    Manual Lymphatic Drainage (MLD)  briefly to right upper chest , axillary nodes and arm     Passive ROM  in supine to R shoulder in direction of flexion and abduction with prolonged holds                  PT Long Term Goals - 09/27/19 1010      PT LONG TERM GOAL #1   Title  Pt will demonstrate 155 degrees of R shoulder flexion to allow her to reach overhead.    Baseline  137 at eval,  on 09/04/2019 is is 158,    Status  Achieved      PT LONG TERM GOAL #2   Title  Pt will demonstrates 160 degrees of R shoulder abduction to allow her to reach out to the side.    Baseline  122 on eval, on 09/04/2019 it is  160    Status  Achieved      PT LONG TERM GOAL #3   Title  Pt will have no palpable cording in R antecubital fossa to allow her to reach overhead without discomfort.    Baseline  numerous cords palpable on eval, on 11/30/ 2020 cord ar palpable but less, 09/08/19- some cording palpable, 12/232020 persistent cording with little change    Period  Weeks    Status  On-going      PT LONG TERM GOAL #4   Title  Pt will be independent in a home exercise program for continued strengthening and stretching.    Baseline  09/27/2019: pt with walking program in house at home with basic UE exercise    Time  4    Period  Weeks    Status  On-going      PT LONG TERM GOAL #5   Title  Pt will report a 75% improvement in tightness across R pec muscle to allow pt to move arm without discomfort.    Baseline  09/04/2019 pt states she cannot feel the tightness today    Status  Achieved      PT LONG TERM GOAL #6   Title  Pt will be able to walk through the grocery store without any seated recovery periods or having to leave due to fatigue.    Baseline  pt had to leave due to fatigue    Time  4    Status  On-going      PT  LONG  TERM GOAL #7   Title  Pt will demonstrate 4/5 hip flexor strength to decrease fall risk.    Baseline  R 3/5, L 3/5    Period  Weeks    Status  On-going      PT LONG TERM GOAL #8   Title  Pt will be able to complete 13 reps of sit to stands in 30 seconds to decrease fall risk.    Baseline  3 reps without use of hands    Status  On-going      PT LONG TERM GOAL  #9   TITLE  Pt will be able to stand on R and L leg in SLS for 12 seconds bilaterally.    Baseline  R 2 seconds, L 7 seconds    Time  4    Period  Weeks    Status  On-going            Plan - 09/27/19 1430    Clinical Impression Statement  Cording persists in right antecubital fossa despite several PT treatments but is helped some with use of compression. Pt now has daily appts with radiation. After discussion, we decided that she will contiue to use her tg soft and compression sleeve and hold off on more aggressive PT sessions until after radiation is complete.  If things worsen, she will call to resume PT sooner, If not, pt will be on hold until after radiation    Comorbidities  diabetes, ovarian cancer, caregiver for husband with dementia, sister brings her to appts    Rehab Potential  Excellent    PT Frequency  2x / week    PT Duration  4 weeks    PT Treatment/Interventions  ADLs/Self Care Home Management;Therapeutic activities;Therapeutic exercise;Patient/family education;Manual lymph drainage;Manual techniques;Compression bandaging;Scar mobilization;Passive range of motion;Taping;Joint Manipulations;Neuromuscular re-education;Gait training;Balance training    PT Next Visit Plan  Reassess and renew after radiation complete. Then, continue working with cording and right arm lymphedema, Once that is stable, conti LE exercises as pt tolerates, NuStep, high level balance, Cont STM and myofascial to cording in R antecubital fossa PRN, AAROM exercises to R shoulder, R pec stretches, scar mobilization, eventually give strength ABC  program    PT Home Exercise Plan  post op breast, supine dowel exercises, supine scap    Consulted and Agree with Plan of Care  Patient       Patient will benefit from skilled therapeutic intervention in order to improve the following deficits and impairments:  Decreased range of motion, Decreased scar mobility, Increased edema, Decreased knowledge of precautions, Decreased strength, Increased fascial restricitons, Impaired UE functional use, Postural dysfunction, Pain, Decreased activity tolerance, Difficulty walking, Decreased balance, Decreased endurance, Decreased mobility  Visit Diagnosis: Muscle weakness (generalized)  Difficulty in walking, not elsewhere classified  Stiffness of right shoulder, not elsewhere classified  Pain in right upper arm  Lymphedema, not elsewhere classified     Problem List Patient Active Problem List   Diagnosis Date Noted  . Port-A-Cath in place 07/11/2019  . Malignant neoplasm of upper-inner quadrant of right breast in female, estrogen receptor positive ([place]) 04/19/2019  . Facial weakness 11/07/2018  . 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 History of hysterectomy 10/12/2018  . History of bilateral salpingo-oophorectomy (BSO) 10/12/2018  . Osteoarthritis of right knee 05/12/2013  . HYPERLIPIDEMIA 12/30/2007  . HYPERTENSION 12/30/2007  . INSOMNIA 12/30/2007  . HYPERSOMNIA 12/30/2007  . DM w/o Complication Type II [id number]  .  HYPERCHOLESTEROLEMIA 12/16/2007  . EXOGENOUS OBESITY 12/16/2007  . SLEEP APNEA, OBSTRUCTIVE 12/16/2007  . ALLERGIC RHINITIS 12/16/2007   Donato Heinz. Owens Shark PT  Norwood Levo 09/27/2019, 2:43 PM  [place], [name], [address] Phone: [phone]   Fax:  [phone]  Name: Kathy Reese MRN: [id number] Date of Birth: 10-27-1950

## 2019-09-27 NOTE — Patient Instructions (Signed)
First of all, check with your insurance company to see if provider is in network    [facility] (for wigs and compression sleeves / gloves/gauntlets )  [address] [place], [place] [address] [phone]  Will file some insurances --- call for appointment   [facility] (for mastectomy prosthetics and garments) [address] [place], [place] [address] [phone] Will file some insurances --- call for appointment  [facility]  [address]  [place], [place] [address] [phone] Lower extremity garments  [facility] [address] [place],   [address] [phone]  Cathy Rubel ( Medicaid certified lymphedema fitter) [phone] [email or web address]  Melissa Meares  [facility]  [phone]  [facility] [address] [place],  [address] [phone]  Other Resources: National Lymphedema Network:  www.lymphnet.org www.[employer].com for patient articles and self manual lymph drainage information [email or web address] has informative articles.  www.[email or web address] [email or web address] 

## 2019-09-28 ENCOUNTER — Other Ambulatory Visit: Payer: Self-pay

## 2019-09-28 ENCOUNTER — Ambulatory Visit
Admission: RE | Admit: 2019-09-28 | Discharge: 2019-09-28 | Disposition: A | Discharge status: Home / Self Care | Payer: Medicare Other | Source: Ambulatory Visit | Attending: Radiation Oncology | Admitting: Radiation Oncology

## 2019-09-28 DIAGNOSIS — Z510 Encounter for antineoplastic radiation therapy: Secondary | ICD-10-CM | POA: Diagnosis not present

## 2019-10-02 ENCOUNTER — Ambulatory Visit
Admission: RE | Admit: 2019-10-02 | Discharge: 2019-10-02 | Disposition: A | Discharge status: Home / Self Care | Payer: Medicare Other | Source: Ambulatory Visit | Attending: Radiation Oncology | Admitting: Radiation Oncology

## 2019-10-02 ENCOUNTER — Other Ambulatory Visit: Payer: Self-pay

## 2019-10-02 DIAGNOSIS — Z510 Encounter for antineoplastic radiation therapy: Secondary | ICD-10-CM | POA: Diagnosis not present

## 2019-10-03 ENCOUNTER — Other Ambulatory Visit: Payer: Medicare Other

## 2019-10-03 ENCOUNTER — Other Ambulatory Visit: Payer: Self-pay

## 2019-10-03 ENCOUNTER — Ambulatory Visit: Payer: Medicare Other

## 2019-10-03 ENCOUNTER — Ambulatory Visit
Admission: RE | Admit: 2019-10-03 | Discharge: 2019-10-03 | Disposition: A | Discharge status: Home / Self Care | Payer: Medicare Other | Source: Ambulatory Visit | Attending: Radiation Oncology | Admitting: Radiation Oncology

## 2019-10-03 DIAGNOSIS — Z510 Encounter for antineoplastic radiation therapy: Secondary | ICD-10-CM | POA: Diagnosis not present

## 2019-10-04 ENCOUNTER — Ambulatory Visit
Admission: RE | Admit: 2019-10-04 | Discharge: 2019-10-04 | Disposition: A | Discharge status: Home / Self Care | Payer: Medicare Other | Source: Ambulatory Visit | Attending: Radiation Oncology | Admitting: Radiation Oncology

## 2019-10-04 ENCOUNTER — Other Ambulatory Visit: Payer: Self-pay

## 2019-10-04 DIAGNOSIS — Z510 Encounter for antineoplastic radiation therapy: Secondary | ICD-10-CM | POA: Diagnosis not present

## 2019-10-05 ENCOUNTER — Other Ambulatory Visit: Payer: Self-pay

## 2019-10-05 ENCOUNTER — Ambulatory Visit
Admission: RE | Admit: 2019-10-05 | Discharge: 2019-10-05 | Disposition: A | Discharge status: Home / Self Care | Payer: Medicare Other | Source: Ambulatory Visit | Attending: Radiation Oncology | Admitting: Radiation Oncology

## 2019-10-05 DIAGNOSIS — Z510 Encounter for antineoplastic radiation therapy: Secondary | ICD-10-CM | POA: Diagnosis not present

## 2019-10-09 ENCOUNTER — Other Ambulatory Visit: Payer: Self-pay

## 2019-10-09 ENCOUNTER — Ambulatory Visit
Admission: RE | Admit: 2019-10-09 | Discharge: 2019-10-09 | Disposition: A | Discharge status: Home / Self Care | Payer: Medicare Other | Source: Ambulatory Visit | Attending: Radiation Oncology | Admitting: Radiation Oncology

## 2019-10-09 ENCOUNTER — Encounter: Payer: Medicare Other | Admitting: Physical Therapy

## 2019-10-09 DIAGNOSIS — C50211 Malignant neoplasm of upper-inner quadrant of right female breast: Secondary | ICD-10-CM | POA: Insufficient documentation

## 2019-10-09 DIAGNOSIS — Z510 Encounter for antineoplastic radiation therapy: Secondary | ICD-10-CM | POA: Diagnosis present

## 2019-10-09 DIAGNOSIS — Z170 Estrogen receptor positive status [ER+]: Secondary | ICD-10-CM | POA: Diagnosis not present

## 2019-10-09 NOTE — Progress Notes (Signed)
Patient Care Team: Nolene Ebbs, MD as PCP - General (Internal Medicine) Mauro Kaufmann, RN as Oncology Nurse Navigator Rockwell Germany, RN as Oncology Nurse Navigator  DIAGNOSIS:    ICD-10-CM   1. Malignant neoplasm of upper-inner quadrant of right breast in female, estrogen receptor positive ([place])  C50.211    Z17.0     SUMMARY OF ONCOLOGIC HISTORY: Oncology History  Malignant neoplasm of upper-inner quadrant of right breast in female, estrogen receptor positive ([place])  04/12/2019 Cancer Staging   Staging form: Breast, AJCC 8th Edition - Clinical stage from 04/12/2019: Stage IA (cT1b, cN0, cM0, G3, ER+, PR-, HER2+) - Signed by Gardenia Phlegm, NP on 04/19/2019   04/19/2019 Initial Diagnosis   Screening mammogram detected right breast asymmetry. Diagnostic mammogram and US showed 2 indeterminate right breast masses, 34m at 1 o'clock, 78mat 12 o'clock, with no axillary adenopathy. 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    05/30/2019 Surgery   Right lumpectomy ([facility] with DCIS, grade 3, 0.6cm, clear margins, 3 axillary lymph nodes negative.    06/06/2019 Cancer Staging   Staging form: Breast, AJCC 8th Edition - Pathologic: Stage IA (pT1b, pN0, cM0, G3, ER+, PR-, HER2+) - Signed by GuNicholas LoseMD on 06/06/2019   06/27/2019 -  Chemotherapy   The patient had PACLitaxel (TAXOL) 174 mg in sodium chloride 0.9 % 250 mL chemo infusion (</= 8041m2), 80 mg/m2 = 174 mg, Intravenous,  Once, 3 of 3 cycles Dose modification: 65 mg/m2 (original dose 80 mg/m2, Cycle 3, Reason: Dose not tolerated) Administration: 174 mg (06/27/2019), 174 mg (07/04/2019), 174 mg (07/25/2019), 174 mg (07/11/2019), 174 mg (07/18/2019), 174 mg (08/01/2019), 174 mg (08/08/2019), 174 mg (08/15/2019), 138 mg (08/22/2019) trastuzumab-anns (KANJINTI) 399 mg in sodium chloride 0.9 % 250 mL chemo infusion, 4 mg/kg = 399 mg (100 % of original dose 4 mg/kg), Intravenous,  Once, 4  of 16 cycles Dose modification: 4 mg/kg (original dose 4 mg/kg, Cycle 1, Reason: Other (see comments), Comment: Biosimilar Conversion; pre[facility] mg/kg (original dose 2 mg/kg, Cycle 3, Reason: Provider Judgment), 6 mg/kg (original dose 2 mg/kg, Cycle 4, Reason: Other (see comments), Comment: switch to maintenance q3 weeks), 600 mg (original dose 2 mg/kg, Cycle 4, Reason: Other (see comments), Comment: maint q3 weeks) Administration: 399 mg (06/27/2019), 189 mg (07/04/2019), 189 mg (07/11/2019), 189 mg (07/18/2019), 189 mg (07/25/2019), 189 mg (08/01/2019), 189 mg (08/08/2019), 189 mg (08/15/2019), 189 mg (08/22/2019), 600 mg (08/29/2019), 600 mg (09/19/2019)  for chemotherapy treatment.    09/26/2019 -  Radiation Therapy   Adjuvant radiation therapy     CHIEF COMPLIANT: Follow-up on Herceptin maintenance   INTERVAL HISTORY: Turkessa TisBeckey DowningiRosado a [age]o. with above-mentioned history of right breast cancerwho underwent a lumpectomy, adjuvant chemotherapy with weekly Taxol and Herceptin, and is currently on Herceptin maintenance and undergoing radiation.Echo on 09/19/19 showed an ejection fraction of 60-65%. She presents to the clinic todayfor treatment.   I have reviewed the past medical history, past surgical history, social history and family history with the patient and they are unchanged from previous note.  ALLERGIES:  has No Known Allergies.  MEDICATIONS:  Current Outpatient Medications  Medication Sig Dispense Refill  . ACCU-CHEK AVIVA PLUS test strip     . Accu-Chek Softclix Lancets lancets     . aspirin EC 81 MG tablet Take 81 mg by mouth daily.    . aMarland Kitchenorvastatin (LIPITOR) 40 MG tablet  Take 40 mg by mouth daily at 6 PM.     . Calcium Carb-Cholecalciferol (CALCIUM 600+D3 PO) Take 1 tablet by mouth 2 (two) times a day.    . cholecalciferol (VITAMIN D3) 25 MCG (1000 UT) tablet Take 1,000 Units by mouth daily.    . Cinnamon 500 MG capsule Take 500 mg by mouth daily.     .  citalopram (CELEXA) 10 MG tablet Take 10 mg by mouth daily.    Marland Kitchen gabapentin (NEURONTIN) 100 MG capsule Take 100 mg by mouth 2 (two) times daily.    . insulin detemir (LEVEMIR) 100 UNIT/ML injection Inject 12-15 Units into the skin See admin instructions. Inject 15 units subcutaneously in the morning & 12 units subcutaneously at night.    Marland Kitchen ipratropium (ATROVENT) 0.06 % nasal spray SMARTSIG:2 Spray(s) Both Nares Every 6 Hours PRN    . lidocaine-prilocaine (EMLA) cream Apply to affected area once 30 g 3  . LORazepam (ATIVAN) 0.5 MG tablet Take 1 tablet (0.5 mg total) by mouth at bedtime as needed for sleep. 30 tablet 0  . losartan-hydrochlorothiazide (HYZAAR) 100-25 MG tablet Take 1 tablet by mouth daily.  4  . meloxicam (MOBIC) 15 MG tablet Take 15 mg by mouth daily as needed for pain.     . metFORMIN (GLUCOPHAGE) 500 MG tablet Take 500 mg by mouth every morning.     . metoprolol (TOPROL-XL) 50 MG 24 hr tablet Take 50 mg by mouth daily.      . ondansetron (ZOFRAN) 8 MG tablet Take 1 tablet (8 mg total) by mouth 2 (two) times daily as needed (Nausea or vomiting). 30 tablet 1  . oxyCODONE (OXY IR/ROXICODONE) 5 MG immediate release tablet Take 1 tablet (5 mg total) by mouth every 6 (six) hours as needed for severe pain. 20 tablet 0  . prochlorperazine (COMPAZINE) 10 MG tablet Take 1 tablet (10 mg total) by mouth every 6 (six) hours as needed (Nausea or vomiting). 30 tablet 1   No current facility-administered medications for this visit.    PHYSICAL EXAMINATION: ECOG PERFORMANCE STATUS: 1 - Symptomatic but completely ambulatory  Vitals:   10/10/19 0947  BP: 118/64  Pulse: 62  Resp: 18  Temp: 97.6 F (36.4 C)  SpO2: 100%   Filed Weights   10/10/19 0947  Weight: 202 lb 4.8 oz (91.8 kg)    LABORATORY DATA:  I have reviewed the data as listed CMP Latest Ref Rng & Units 09/19/2019 08/29/2019 08/22/2019  Glucose 70 - 99 mg/dL 116(H) 212(H) 201(H)  BUN 8 - 23 mg/dL 24(H) 23 25(H)    Creatinine 0.44 - 1.00 mg/dL 0.91 1.08(H) 1.09(H)  Sodium 135 - 145 mmol/L 141 136 136  Potassium 3.5 - 5.1 mmol/L 3.9 4.0 4.3  Chloride 98 - 111 mmol/L 103 100 103  CO2 22 - 32 mmol/L _0 Calcium 8.9 - 10.3 mg/dL 9.8 9.1 9.2  Total Protein 6.5 - 8.1 g/dL 7.3 6.9 7.0  Total Bilirubin 0.3 - 1.2 mg/dL 0.8 0.9 0.7  Alkaline Phos 38 - 126 U/L 121 110 112  AST 15 - 41 U/L 31 35 29  ALT 0 - 44 U/L 32 45(H) 41    Lab Results  Component Value Date   WBC 6.3 10/10/2019   HGB 10.5 (L) 10/10/2019   HCT 32.9 (L) 10/10/2019   MCV 93.2 10/10/2019   PLT 204 10/10/2019   NEUTROABS 4.1 10/10/2019    ASSESSMENT & PLAN:  Malignant neoplasm of upper-inner quadrant  of right breast in female, estrogen receptor positive ([place]) 04/19/2019:Screening mammogram detected right breast asymmetry. Diagnostic mammogram and US showed 2 indeterminate right breast masses, 6m at 1 o'clock, 780mat 12 o'clock, with no axillary adenopathy. 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 Stage Ia  05/30/2019: [facility], grade 3, 0.6cm, clear margins, 3 axillary lymph nodes negative.HER-2 positive (3+), ER+ (80%), PR -, Ki67 40%. Stage Ia  Treatment plan: 1.Adjuvant chemo with Taxol Herceptin x9 cycles(stopped for neuropathy) 06/27/2019-08/22/2019 2. Adjuvant radiation therapy 09/26/2019 3. Adjuvant antiestrogen therapy ------------------------------------------------------------------------------------------------------------------------------------------------------- Current treatment:  Herceptin maintenance therapy  09/19/2019: Echocardiogram EF 60 to 65%  Chemo toxicities: 1.Chemotherapy-induced anemia: Hemoglobin is9.2and will be monitored 2.Herceptin-induced diarrhea: on Imodium. 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  Once radiation is complete then we will start antiestrogen therapy.    No orders of the defined types were placed in this encounter.  The patient has a good understanding of the overall plan. she agrees with it. she will call with any problems that may develop before the next visit here.  Total time spent: 30 mins including face to face time and time spent for planning, charting and coordination of care  GuNicholas LoseMD 10/10/2019  I, MoCloyde Reamsorshimer, am acting as scribe for Dr. ViNicholas Lose I have reviewed the above document for accuracy and completeness, and I agree with the above.

## 2019-10-10 ENCOUNTER — Inpatient Hospital Stay: Payer: Medicare Other | Admitting: Hematology and Oncology

## 2019-10-10 ENCOUNTER — Ambulatory Visit
Admission: RE | Admit: 2019-10-10 | Discharge: 2019-10-10 | Disposition: A | Discharge status: Home / Self Care | Payer: Medicare Other | Source: Ambulatory Visit | Attending: Radiation Oncology | Admitting: Radiation Oncology

## 2019-10-10 ENCOUNTER — Inpatient Hospital Stay: Payer: Medicare Other

## 2019-10-10 ENCOUNTER — Other Ambulatory Visit: Payer: Self-pay

## 2019-10-10 ENCOUNTER — Inpatient Hospital Stay: Payer: Medicare Other | Attending: Hematology and Oncology

## 2019-10-10 DIAGNOSIS — Z5112 Encounter for antineoplastic immunotherapy: Secondary | ICD-10-CM | POA: Diagnosis not present

## 2019-10-10 DIAGNOSIS — Z170 Estrogen receptor positive status [ER+]: Secondary | ICD-10-CM | POA: Insufficient documentation

## 2019-10-10 DIAGNOSIS — C50211 Malignant neoplasm of upper-inner quadrant of right female breast: Secondary | ICD-10-CM

## 2019-10-10 DIAGNOSIS — Z510 Encounter for antineoplastic radiation therapy: Secondary | ICD-10-CM | POA: Diagnosis not present

## 2019-10-10 LAB — CMP (CANCER CENTER ONLY)
ALT: 28 U/L (ref 0–44)
AST: 27 U/L (ref 15–41)
Albumin: 4 g/dL (ref 3.5–5.0)
Alkaline Phosphatase: 99 U/L (ref 38–126)
Anion gap: 9 (ref 5–15)
BUN: 24 mg/dL — ABNORMAL HIGH (ref 8–23)
CO2: 26 mmol/L (ref 22–32)
Calcium: 9.4 mg/dL (ref 8.9–10.3)
Chloride: 102 mmol/L (ref 98–111)
Creatinine: 0.83 mg/dL (ref 0.44–1.00)
GFR, Est AFR Am: >60 mL/min (ref >60)
GFR, Estimated: >60 mL/min (ref >60)
Glucose, Bld: 158 mg/dL — ABNORMAL HIGH (ref 70–99)
Potassium: 4 mmol/L (ref 3.5–5.1)
Sodium: 137 mmol/L (ref 135–145)
Total Bilirubin: 1.1 mg/dL (ref 0.3–1.2)
Total Protein: 7 g/dL (ref 6.5–8.1)

## 2019-10-10 LAB — CBC WITH DIFFERENTIAL (CANCER CENTER ONLY)
Abs Immature Granulocytes: 0.01 10*3/uL (ref 0.00–0.07)
Basophils Absolute: 0 10*3/uL (ref 0.0–0.1)
Basophils Relative: 0 %
Eosinophils Absolute: 0.2 10*3/uL (ref 0.0–0.5)
Eosinophils Relative: 4 %
HCT: 32.9 % — ABNORMAL LOW (ref 36.0–46.0)
Hemoglobin: 10.5 g/dL — ABNORMAL LOW (ref 12.0–15.0)
Immature Granulocytes: 0 %
Lymphocytes Relative: 22 %
Lymphs Abs: 1.4 10*3/uL (ref 0.7–4.0)
MCH: 29.7 pg (ref 26.0–34.0)
MCHC: 31.9 g/dL (ref 30.0–36.0)
MCV: 93.2 fL (ref 80.0–100.0)
Monocytes Absolute: 0.5 10*3/uL (ref 0.1–1.0)
Monocytes Relative: 9 %
Neutro Abs: 4.1 10*3/uL (ref 1.7–7.7)
Neutrophils Relative %: 65 %
Platelet Count: 204 10*3/uL (ref 150–400)
RBC: 3.53 MIL/uL — ABNORMAL LOW (ref 3.87–5.11)
RDW: 15.2 % (ref 11.5–15.5)
WBC Count: 6.3 10*3/uL (ref 4.0–10.5)
nRBC: 0 % (ref 0.0–0.2)

## 2019-10-10 MED ORDER — DIPHENHYDRAMINE HCL 25 MG PO CAPS
50.0000 mg | ORAL_CAPSULE | Freq: Once | ORAL | Status: AC
  Administered 2019-10-10: 50 mg via ORAL

## 2019-10-10 MED ORDER — ACETAMINOPHEN 325 MG PO TABS
ORAL_TABLET | ORAL | Status: AC
  Filled 2019-10-10: qty 2

## 2019-10-10 MED ORDER — HEPARIN SOD (PORK) LOCK FLUSH 100 UNIT/ML IV SOLN
500.0000 [IU] | Freq: Once | INTRAVENOUS | Status: AC | PRN
  Administered 2019-10-10: 12:00:00 500 [IU]
  Filled 2019-10-10: qty 5

## 2019-10-10 MED ORDER — SODIUM CHLORIDE 0.9% FLUSH
10.0000 mL | INTRAVENOUS | Status: DC | PRN
  Administered 2019-10-10: 12:00:00 10 mL
  Filled 2019-10-10: qty 10

## 2019-10-10 MED ORDER — TRASTUZUMAB-ANNS CHEMO 150 MG IV SOLR
600.0000 mg | Freq: Once | INTRAVENOUS | Status: AC
  Administered 2019-10-10: 600 mg via INTRAVENOUS
  Filled 2019-10-10: qty 28.57

## 2019-10-10 MED ORDER — DIPHENHYDRAMINE HCL 25 MG PO CAPS
ORAL_CAPSULE | ORAL | Status: AC
  Filled 2019-10-10: qty 2

## 2019-10-10 MED ORDER — ACETAMINOPHEN 325 MG PO TABS
650.0000 mg | ORAL_TABLET | Freq: Once | ORAL | Status: AC
  Administered 2019-10-10: 11:00:00 650 mg via ORAL

## 2019-10-10 MED ORDER — SODIUM CHLORIDE 0.9 % IV SOLN
Freq: Once | INTRAVENOUS | Status: AC
  Filled 2019-10-10: qty 250

## 2019-10-10 NOTE — Assessment & Plan Note (Signed)
04/19/2019:Screening mammogram detected right breast asymmetry. Diagnostic mammogram and US showed 2 indeterminate right breast masses, 55m at 1 o'clock, 754mat 12 o'clock, with no axillary adenopathy. 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 Stage Ia  05/30/2019: [facility], grade 3, 0.6cm, clear margins, 3 axillary lymph nodes negative.HER-2 positive (3+), ER+ (80%), PR -, Ki67 40%. Stage Ia  Treatment plan: 1.Adjuvant chemo with Taxol Herceptin x9 cycles(stopped for neuropathy) 06/27/2019-08/22/2019 2. Adjuvant radiation therapy 09/26/2019 3. Adjuvant antiestrogen therapy ------------------------------------------------------------------------------------------------------------------------------------------------------- Current treatment:  Herceptin maintenance therapy  09/19/2019: Echocardiogram EF 60 to 65%  Chemo toxicities: 1.Chemotherapy-induced anemia: Hemoglobin is9.2and will be monitored 2.Herceptin-induced diarrhea: on Imodium. 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  Once radiation is complete then we will start antiestrogen therapy.

## 2019-10-10 NOTE — Patient Instructions (Signed)

## 2019-10-11 ENCOUNTER — Encounter: Payer: Medicare Other | Admitting: Physical Therapy

## 2019-10-11 ENCOUNTER — Ambulatory Visit
Admission: RE | Admit: 2019-10-11 | Discharge: 2019-10-11 | Disposition: A | Discharge status: Home / Self Care | Payer: Medicare Other | Source: Ambulatory Visit | Attending: Radiation Oncology | Admitting: Radiation Oncology

## 2019-10-11 ENCOUNTER — Other Ambulatory Visit: Payer: Self-pay

## 2019-10-11 DIAGNOSIS — Z510 Encounter for antineoplastic radiation therapy: Secondary | ICD-10-CM | POA: Diagnosis not present

## 2019-10-12 ENCOUNTER — Other Ambulatory Visit: Payer: Self-pay

## 2019-10-12 ENCOUNTER — Ambulatory Visit
Admission: RE | Admit: 2019-10-12 | Discharge: 2019-10-12 | Disposition: A | Discharge status: Home / Self Care | Payer: Medicare Other | Source: Ambulatory Visit | Attending: Radiation Oncology | Admitting: Radiation Oncology

## 2019-10-12 DIAGNOSIS — Z510 Encounter for antineoplastic radiation therapy: Secondary | ICD-10-CM | POA: Diagnosis not present

## 2019-10-13 ENCOUNTER — Other Ambulatory Visit: Payer: Self-pay

## 2019-10-13 ENCOUNTER — Ambulatory Visit
Admission: RE | Admit: 2019-10-13 | Discharge: 2019-10-13 | Disposition: A | Discharge status: Home / Self Care | Payer: Medicare Other | Source: Ambulatory Visit | Attending: Radiation Oncology | Admitting: Radiation Oncology

## 2019-10-13 DIAGNOSIS — Z510 Encounter for antineoplastic radiation therapy: Secondary | ICD-10-CM | POA: Diagnosis not present

## 2019-10-16 ENCOUNTER — Other Ambulatory Visit: Payer: Self-pay

## 2019-10-16 ENCOUNTER — Ambulatory Visit
Admission: RE | Admit: 2019-10-16 | Discharge: 2019-10-16 | Disposition: A | Discharge status: Home / Self Care | Payer: Medicare Other | Source: Ambulatory Visit | Attending: Radiation Oncology | Admitting: Radiation Oncology

## 2019-10-16 DIAGNOSIS — Z510 Encounter for antineoplastic radiation therapy: Secondary | ICD-10-CM | POA: Diagnosis not present

## 2019-10-17 ENCOUNTER — Ambulatory Visit
Admission: RE | Admit: 2019-10-17 | Discharge: 2019-10-17 | Disposition: A | Discharge status: Home / Self Care | Payer: Medicare Other | Source: Ambulatory Visit | Attending: Radiation Oncology | Admitting: Radiation Oncology

## 2019-10-17 ENCOUNTER — Other Ambulatory Visit: Payer: Self-pay

## 2019-10-17 DIAGNOSIS — Z510 Encounter for antineoplastic radiation therapy: Secondary | ICD-10-CM | POA: Diagnosis not present

## 2019-10-17 DIAGNOSIS — C50211 Malignant neoplasm of upper-inner quadrant of right female breast: Secondary | ICD-10-CM

## 2019-10-17 DIAGNOSIS — Z170 Estrogen receptor positive status [ER+]: Secondary | ICD-10-CM

## 2019-10-17 NOTE — Progress Notes (Deleted)
69 y.o. [id number] Married Black or [place] American Not Hispanic or Latino female here for annual exam.      Patient's last menstrual period was 02/02/1993 (exact date).          Sexually active: {yes no:314532}  The current method of family planning is {contraception:315051}.    Exercising: {yes no:314532}  {types:19826} Smoker:  {YES P5382123  Health Maintenance: Pap:  10/29/12 WNL History of abnormal Pap:  no MMG:  03/29/19 suspicious right breast Bx 05/30/19 right ductal carcinoma.  BMD:   2018 WNL Colonoscopy: 03-22-13 WNL repeat in 10 yrs TDaP:  *** Gardasil: NA   reports that she has never smoked. She has never used smokeless tobacco. She reports current alcohol use of about 1.0 standard drinks of alcohol per week. She reports that she does not use drugs.  Past Medical History:  Diagnosis Date  . Allergic rhinitis, cause unspecified   . Arthritis   . Depression   . Diverticulosis   . History of Bell's palsy   . Hypersomnia   . OSA on CPAP    not using at this time 06/15/19  . Other and unspecified hyperlipidemia   . Personal history of malignant neoplasm of ovary   . Type II or unspecified type diabetes mellitus without mention of complication, not stated as uncontrolled   . Unspecified essential hypertension   . Vertigo     Past Surgical History:  Procedure Laterality Date  . ABDOMINAL ADHESION SURGERY  12/96   exc peritoneal cyst  . BREAST BIOPSY Left 2000   stereotactic, negative  . BREAST LUMPECTOMY WITH RADIOACTIVE SEED AND SENTINEL LYMPH NODE BIOPSY Right 05/30/2019   Procedure: RIGHT BREAST LUMPECTOMY WITH BRACKETED RADIOACTIVE SEEDS  AND SENTINEL LYMPH NODE BIOPSY;  Surgeon: Stark Klein, MD;  Location: [facility];  Service: General;  Laterality: Right;  . BREAST REDUCTION SURGERY Bilateral 10/03  . EYE SURGERY Left    cataracts  . LAPAROSCOPIC SALPINGO OOPHERECTOMY Right 12/96  . 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 LAPAROSCOPY ABDOMEN DIAGNOSTIC  2/08   w/LOA, LSO  . PORTACATH PLACEMENT Left 06/16/2019   Procedure: INSERTION PORT-A-CATH;  Surgeon: Stark Klein, MD;  Location: [facility];  Service: General;  Laterality: Left;  . REDUCTION MAMMAPLASTY Bilateral 2004  . TONSILLECTOMY    . TOTAL ABDOMINAL HYSTERECTOMY W/ BILATERAL SALPINGOOPHORECTOMY  5/94   secondary to uterine fibroids  . TOTAL KNEE ARTHROPLASTY Right 05/12/2013   Procedure: TOTAL KNEE ARTHROPLASTY;  Surgeon: Alta Corning, MD;  Location: [facility];  Service: Orthopedics;  Laterality: Right;  . TRIGGER FINGER RELEASE  07/06/2012   Procedure: RELEASE TRIGGER FINGER/A-1 PULLEY;  Surgeon: Alta Corning, MD;  Location: [facility];  Service: Orthopedics;  Laterality: Left;  left ring finger  . TUBAL LIGATION      Current Outpatient Medications  Medication Sig Dispense Refill  . ACCU-CHEK AVIVA PLUS test strip     . Accu-Chek Softclix Lancets lancets     . aspirin EC 81 MG tablet Take 81 mg by mouth daily.    Marland Kitchen atorvastatin (LIPITOR) 40 MG tablet Take 40 mg by mouth daily at 6 PM.     . Calcium Carb-Cholecalciferol (CALCIUM 600+D3 PO) Take 1 tablet by mouth 2 (two) times a day.    . cholecalciferol (VITAMIN D3) 25 MCG (1000 UT) tablet Take 1,000 Units by mouth daily.    . Cinnamon 500 MG capsule Take 500  mg by mouth daily.     . citalopram (CELEXA) 10 MG tablet Take 10 mg by mouth daily.    Marland Kitchen gabapentin (NEURONTIN) 100 MG capsule Take 100 mg by mouth 2 (two) times daily.    . insulin detemir (LEVEMIR) 100 UNIT/ML injection Inject 12-15 Units into the skin See admin instructions. Inject 15 units subcutaneously in the morning & 12 units subcutaneously at night.    Marland Kitchen ipratropium (ATROVENT) 0.06 % nasal spray SMARTSIG:2 Spray(s) Both Nares Every 6 Hours PRN    . lidocaine-prilocaine (EMLA) cream Apply to affected area once 30 g 3  . LORazepam (ATIVAN) 0.5 MG tablet Take 1 tablet (0.5 mg total) by mouth at bedtime as needed for  sleep. 30 tablet 0  . losartan-hydrochlorothiazide (HYZAAR) 100-25 MG tablet Take 1 tablet by mouth daily.  4  . meloxicam (MOBIC) 15 MG tablet Take 15 mg by mouth daily as needed for pain.     . metFORMIN (GLUCOPHAGE) 500 MG tablet Take 500 mg by mouth every morning.     . metoprolol (TOPROL-XL) 50 MG 24 hr tablet Take 50 mg by mouth daily.      . ondansetron (ZOFRAN) 8 MG tablet Take 1 tablet (8 mg total) by mouth 2 (two) times daily as needed (Nausea or vomiting). 30 tablet 1  . oxyCODONE (OXY IR/ROXICODONE) 5 MG immediate release tablet Take 1 tablet (5 mg total) by mouth every 6 (six) hours as needed for severe pain. 20 tablet 0  . prochlorperazine (COMPAZINE) 10 MG tablet Take 1 tablet (10 mg total) by mouth every 6 (six) hours as needed (Nausea or vomiting). 30 tablet 1   No current facility-administered medications for this visit.    Family History  Problem Relation Age of Onset  . Lung cancer Mother   . Colon cancer Father   . Stroke Brother   . Stroke Sister   . Pulmonary Hypertension Sister     Review of Systems  Exam:   LMP 02/02/1993 (Exact Date)   Weight change: @WEIGHTCHANGE @ Height:      Ht Readings from Last 3 Encounters:  10/10/19 5\' 7"  (1.702 m)  08/29/19 5\' 7"  (1.702 m)  08/15/19 5\' 7"  (1.702 m)    General appearance: alert, cooperative and appears stated age Head: Normocephalic, without obvious abnormality, atraumatic Neck: no adenopathy, supple, symmetrical, trachea midline and thyroid {CHL AMB PHY EX THYROID NORM DEFAULT:[phone]::"normal to inspection and palpation"} Lungs: clear to auscultation bilaterally Cardiovascular: regular rate and rhythm Breasts: {Exam; breast:13139::"normal appearance, no masses or tenderness"} Abdomen: soft, non-tender; non distended,  no masses,  no organomegaly Extremities: extremities normal, atraumatic, no cyanosis or edema Skin: Skin color, texture, turgor normal. No rashes or lesions Lymph nodes: Cervical,  supraclavicular, and axillary nodes normal. No abnormal inguinal nodes palpated Neurologic: Grossly normal   Pelvic: External genitalia:  no lesions              Urethra:  normal appearing urethra with no masses, tenderness or lesions              Bartholins and Skenes: normal                 Vagina: normal appearing vagina with normal color and discharge, no lesions              Cervix: {CHL AMB PHY EX CERVIX NORM DEFAULT:[id number]::"no lesions"}               Bimanual Exam:  Uterus:  {CHL AMB  PHY EX UTERUS NORM DEFAULT:[phone]::"normal size, contour, position, consistency, mobility, non-tender"}              Adnexa: {CHL AMB PHY EX ADNEXA NO MASS DEFAULT:[phone]::"no mass, fullness, tenderness"}               Rectovaginal: Confirms               Anus:  normal sphincter tone, no lesions  *** chaperoned for the exam.  A:  Well Woman with normal exam  P:

## 2019-10-17 NOTE — Progress Notes (Signed)
.  Simulation verification  The patient was brought to the treatment machine and placed in the plan treatment position.  Clinical set up was verified to ensure that the target region is appropriately covered for the patient's upcoming electron boost treatment.  The targeted volume of tissue is appropriately covered by the radiation field.  Based on my personal review, I approve the simulation verification.  The patient's treatment will proceed as planned.  ------------------------------------------------  -----------------------------------  Aroura Vasudevan D. Ayad Nieman, PhD, MD  

## 2019-10-18 ENCOUNTER — Ambulatory Visit: Payer: Medicare Other

## 2019-10-19 ENCOUNTER — Ambulatory Visit: Payer: Medicare Other

## 2019-10-19 ENCOUNTER — Ambulatory Visit: Payer: Medicare Other | Admitting: Obstetrics and Gynecology

## 2019-10-20 ENCOUNTER — Ambulatory Visit: Payer: Medicare Other

## 2019-10-20 ENCOUNTER — Telehealth: Payer: Self-pay

## 2019-10-20 NOTE — Telephone Encounter (Signed)
Pt calling to report negative Covid test. Pt to resume XRT on Monday 10/23/19. Loma Sousa, RN BSN

## 2019-10-23 ENCOUNTER — Other Ambulatory Visit: Payer: Self-pay

## 2019-10-23 ENCOUNTER — Ambulatory Visit
Admission: RE | Admit: 2019-10-23 | Discharge: 2019-10-23 | Disposition: A | Discharge status: Home / Self Care | Payer: Medicare Other | Source: Ambulatory Visit | Attending: Radiation Oncology | Admitting: Radiation Oncology

## 2019-10-23 ENCOUNTER — Ambulatory Visit: Payer: Medicare Other

## 2019-10-23 ENCOUNTER — Encounter: Payer: Self-pay | Admitting: *Deleted

## 2019-10-23 DIAGNOSIS — Z510 Encounter for antineoplastic radiation therapy: Secondary | ICD-10-CM | POA: Diagnosis not present

## 2019-10-24 ENCOUNTER — Ambulatory Visit
Admission: RE | Admit: 2019-10-24 | Discharge: 2019-10-24 | Disposition: A | Discharge status: Home / Self Care | Payer: Medicare Other | Source: Ambulatory Visit | Attending: Radiation Oncology | Admitting: Radiation Oncology

## 2019-10-24 ENCOUNTER — Other Ambulatory Visit: Payer: Self-pay

## 2019-10-24 ENCOUNTER — Ambulatory Visit: Payer: Medicare Other

## 2019-10-24 DIAGNOSIS — Z510 Encounter for antineoplastic radiation therapy: Secondary | ICD-10-CM | POA: Diagnosis not present

## 2019-10-25 ENCOUNTER — Other Ambulatory Visit: Payer: Self-pay

## 2019-10-25 ENCOUNTER — Ambulatory Visit
Admission: RE | Admit: 2019-10-25 | Discharge: 2019-10-25 | Disposition: A | Discharge status: Home / Self Care | Payer: Medicare Other | Source: Ambulatory Visit | Attending: Radiation Oncology | Admitting: Radiation Oncology

## 2019-10-25 DIAGNOSIS — Z510 Encounter for antineoplastic radiation therapy: Secondary | ICD-10-CM | POA: Diagnosis not present

## 2019-10-26 ENCOUNTER — Ambulatory Visit
Admission: RE | Admit: 2019-10-26 | Discharge: 2019-10-26 | Disposition: A | Discharge status: Home / Self Care | Payer: Medicare Other | Source: Ambulatory Visit | Attending: Radiation Oncology | Admitting: Radiation Oncology

## 2019-10-26 ENCOUNTER — Other Ambulatory Visit: Payer: Self-pay

## 2019-10-26 DIAGNOSIS — Z510 Encounter for antineoplastic radiation therapy: Secondary | ICD-10-CM | POA: Diagnosis not present

## 2019-10-27 ENCOUNTER — Ambulatory Visit
Admission: RE | Admit: 2019-10-27 | Discharge: 2019-10-27 | Disposition: A | Discharge status: Home / Self Care | Payer: Medicare Other | Source: Ambulatory Visit | Attending: Radiation Oncology | Admitting: Radiation Oncology

## 2019-10-27 ENCOUNTER — Encounter: Payer: Self-pay | Admitting: Radiation Oncology

## 2019-10-27 ENCOUNTER — Encounter: Payer: Self-pay | Admitting: *Deleted

## 2019-10-27 ENCOUNTER — Other Ambulatory Visit: Payer: Self-pay

## 2019-10-27 DIAGNOSIS — Z510 Encounter for antineoplastic radiation therapy: Secondary | ICD-10-CM | POA: Diagnosis not present

## 2019-10-30 ENCOUNTER — Ambulatory Visit: Payer: Medicare Other | Admitting: Physical Therapy

## 2019-10-31 ENCOUNTER — Other Ambulatory Visit: Payer: Self-pay

## 2019-10-31 ENCOUNTER — Inpatient Hospital Stay: Payer: Medicare Other

## 2019-10-31 VITALS — BP 129/56 | HR 59 | Temp 98.1°F | Resp 18

## 2019-10-31 DIAGNOSIS — Z5112 Encounter for antineoplastic immunotherapy: Secondary | ICD-10-CM | POA: Diagnosis not present

## 2019-10-31 DIAGNOSIS — Z170 Estrogen receptor positive status [ER+]: Secondary | ICD-10-CM

## 2019-10-31 DIAGNOSIS — C50211 Malignant neoplasm of upper-inner quadrant of right female breast: Secondary | ICD-10-CM

## 2019-10-31 MED ORDER — SODIUM CHLORIDE 0.9% FLUSH
10.0000 mL | INTRAVENOUS | Status: DC | PRN
  Administered 2019-10-31: 10 mL
  Filled 2019-10-31: qty 10

## 2019-10-31 MED ORDER — DIPHENHYDRAMINE HCL 25 MG PO CAPS
50.0000 mg | ORAL_CAPSULE | Freq: Once | ORAL | Status: AC
  Administered 2019-10-31: 50 mg via ORAL

## 2019-10-31 MED ORDER — ACETAMINOPHEN 325 MG PO TABS
650.0000 mg | ORAL_TABLET | Freq: Once | ORAL | Status: AC
  Administered 2019-10-31: 650 mg via ORAL

## 2019-10-31 MED ORDER — SODIUM CHLORIDE 0.9 % IV SOLN
Freq: Once | INTRAVENOUS | Status: AC
  Filled 2019-10-31: qty 250

## 2019-10-31 MED ORDER — ACETAMINOPHEN 325 MG PO TABS
ORAL_TABLET | ORAL | Status: AC
  Filled 2019-10-31: qty 2

## 2019-10-31 MED ORDER — HEPARIN SOD (PORK) LOCK FLUSH 100 UNIT/ML IV SOLN
500.0000 [IU] | Freq: Once | INTRAVENOUS | Status: AC | PRN
  Administered 2019-10-31: 500 [IU]
  Filled 2019-10-31: qty 5

## 2019-10-31 MED ORDER — TRASTUZUMAB-ANNS CHEMO 150 MG IV SOLR
600.0000 mg | Freq: Once | INTRAVENOUS | Status: AC
  Administered 2019-10-31: 600 mg via INTRAVENOUS
  Filled 2019-10-31: qty 28.57

## 2019-10-31 MED ORDER — DIPHENHYDRAMINE HCL 25 MG PO CAPS
ORAL_CAPSULE | ORAL | Status: AC
  Filled 2019-10-31: qty 2

## 2019-10-31 NOTE — Patient Instructions (Signed)
[facility] Discharge Instructions for Patients Receiving Chemotherapy  Today you received the following chemotherapy agents trastuzumab.  To help prevent nausea and vomiting after your treatment, we encourage you to take your nausea medication as directed.    If you develop nausea and vomiting that is not controlled by your nausea medication, call the clinic.   BELOW ARE SYMPTOMS THAT SHOULD BE REPORTED IMMEDIATELY:  *FEVER GREATER THAN 100.5 F  *CHILLS WITH OR WITHOUT FEVER  NAUSEA AND VOMITING THAT IS NOT CONTROLLED WITH YOUR NAUSEA MEDICATION  *UNUSUAL SHORTNESS OF BREATH  *UNUSUAL BRUISING OR BLEEDING  TENDERNESS IN MOUTH AND THROAT WITH OR WITHOUT PRESENCE OF ULCERS  *URINARY PROBLEMS  *BOWEL PROBLEMS  UNUSUAL RASH Items with * indicate a potential emergency and should be followed up as soon as possible.  Feel free to call the clinic should you have any questions or concerns. The clinic phone number is [phone].  Please show the CHEMO ALERT CARD at check-in to the Emergency Department and triage nurse.   

## 2019-11-01 ENCOUNTER — Encounter: Payer: Medicare Other | Admitting: Physical Therapy

## 2019-11-06 ENCOUNTER — Encounter: Payer: Self-pay | Admitting: Physical Therapy

## 2019-11-06 ENCOUNTER — Ambulatory Visit: Payer: Medicare Other | Attending: General Surgery | Admitting: Physical Therapy

## 2019-11-06 ENCOUNTER — Other Ambulatory Visit: Payer: Self-pay

## 2019-11-06 DIAGNOSIS — M79621 Pain in right upper arm: Secondary | ICD-10-CM | POA: Insufficient documentation

## 2019-11-06 DIAGNOSIS — I890 Lymphedema, not elsewhere classified: Secondary | ICD-10-CM | POA: Diagnosis present

## 2019-11-06 DIAGNOSIS — R262 Difficulty in walking, not elsewhere classified: Secondary | ICD-10-CM | POA: Diagnosis present

## 2019-11-06 DIAGNOSIS — M6281 Muscle weakness (generalized): Secondary | ICD-10-CM | POA: Diagnosis not present

## 2019-11-06 DIAGNOSIS — M25611 Stiffness of right shoulder, not elsewhere classified: Secondary | ICD-10-CM | POA: Insufficient documentation

## 2019-11-06 NOTE — Therapy (Signed)
[place], [name], [address] Phone: [phone]   Fax:  [phone]  Physical Therapy Treatment  Patient Details  Name: Kathy Reese MRN: [id number] Date of Birth: Oct 13, 1950 Referring Provider (PT): Barry Dienes   Encounter Date: 11/06/2019  PT End of Session - 11/06/19 1139    Visit Number  14    Number of Visits  17    PT Start Time  1100    PT Stop Time  1130   pt did not need more than 30 minutes today   PT Time Calculation (min)  30 min    Activity Tolerance  Patient tolerated treatment well    Behavior During Therapy  [facility] for tasks assessed/performed       Past Medical History:  Diagnosis Date  . Allergic rhinitis, cause unspecified   . Arthritis   . Depression   . Diverticulosis   . History of Bell's palsy   . Hypersomnia   . OSA on CPAP    not using at this time 06/15/19  . Other and unspecified hyperlipidemia   . Personal history of malignant neoplasm of ovary   . Type II or unspecified type diabetes mellitus without mention of complication, not stated as uncontrolled   . Unspecified essential hypertension   . Vertigo     Past Surgical History:  Procedure Laterality Date  . ABDOMINAL ADHESION SURGERY  12/96   exc peritoneal cyst  . BREAST BIOPSY Left 2000   stereotactic, negative  . BREAST LUMPECTOMY WITH RADIOACTIVE SEED AND SENTINEL LYMPH NODE BIOPSY Right 05/30/2019   Procedure: RIGHT BREAST LUMPECTOMY WITH BRACKETED RADIOACTIVE SEEDS  AND SENTINEL LYMPH NODE BIOPSY;  Surgeon: Stark Klein, MD;  Location: [place];  Service: General;  Laterality: Right;  . BREAST REDUCTION SURGERY Bilateral 10/03  . EYE SURGERY Left    cataracts  . LAPAROSCOPIC SALPINGO OOPHERECTOMY Right 12/96  . 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 LAPAROSCOPY ABDOMEN DIAGNOSTIC  2/08   w/LOA, LSO  . PORTACATH PLACEMENT Left 06/16/2019   Procedure:  INSERTION PORT-A-CATH;  Surgeon: Stark Klein, MD;  Location: [place];  Service: General;  Laterality: Left;  . REDUCTION MAMMAPLASTY Bilateral 2004  . TONSILLECTOMY    . TOTAL ABDOMINAL HYSTERECTOMY W/ BILATERAL SALPINGOOPHORECTOMY  5/94   secondary to uterine fibroids  . TOTAL KNEE ARTHROPLASTY Right 05/12/2013   Procedure: TOTAL KNEE ARTHROPLASTY;  Surgeon: Alta Corning, MD;  Location: [place];  Service: Orthopedics;  Laterality: Right;  . TRIGGER FINGER RELEASE  07/06/2012   Procedure: RELEASE TRIGGER FINGER/A-1 PULLEY;  Surgeon: Alta Corning, MD;  Location: [place];  Service: Orthopedics;  Laterality: Left;  left ring finger  . TUBAL LIGATION      There were no vitals filed for this visit.  Subjective Assessment - 11/06/19 1103    Subjective  Pt states she she is doing good.  She did well with radiation. She is still using the sleeve about every other day . She feels that she does not need more skilled PT at this time    Pertinent History  R breast cancer, s/p R lumpectomy and SLNB on 05/30/19, 3 nodes removed all negative, currently undergoing chemo, pt will begin radiation after chemo    Patient Stated Goals  to relieve that pain and get back to normal    Currently in Pain?  No/denies  [facility] PT Assessment - 11/06/19 0001      Assessment   Medical Diagnosis  R breast cancer    Referring Provider (PT)  Byerly    Onset Date/Surgical Date  05/30/19    Hand Dominance  Right      Cognition   Overall Cognitive Status  Within Functional Limits for tasks assessed      Observation/Other Assessments   Observations  skin darker in left upper quadrant from radiation       Functional Tests   Functional tests  Sit to Stand      Sit to Stand   Comments  12 reps in 30 sec for sit to stand without use of hands       ROM / Strength   AROM / PROM / Strength  AROM;Strength      AROM   Right Shoulder Flexion  165 Degrees    Right Shoulder ABduction  165 Degrees       Strength   Right Hip Flexion  4/5    Right Hip ABduction  4/5    Right Hip ADduction  4/5    Left Hip Flexion  4/5    Left Hip ABduction  4/5    Left Hip ADduction  4/5    Right Knee Flexion  4/5    Right Knee Extension  4/5    Left Knee Flexion  4/5    Left Knee Extension  4/5      Palpation   Palpation comment  pt with firm area at supeior breast that she states as "gone down"  minor asymptomatic cording palpable in lateral antecubital fossa         LYMPHEDEMA/ONCOLOGY QUESTIONNAIRE - 11/06/19 1113      Right Upper Extremity Lymphedema   15 cm Proximal to Olecranon Process  40 cm    Olecranon Process  27 cm    15 cm Proximal to Ulnar Styloid Process  26.5 cm    Just Proximal to Ulnar Styloid Process  18 cm    Across Hand at [employer]  19.5 cm    At [place] of 2nd Digit  6.4 cm                [facility] Adult PT Treatment/Exercise - 11/06/19 0001      Exercises   Exercises  Other Exercises    Other Exercises   reinforced exercises for shoulder mobility and to stretch antecubital fossa       Manual Therapy   Manual Therapy  Edema management    Edema Management  gave pt piece of small dotted foam and thick foam for fullness in superior breast to wear in compression bra                   PT Long Term Goals - 11/06/19 1105      PT LONG TERM GOAL #1   Title  Pt will demonstrate 155 degrees of R shoulder flexion to allow her to reach overhead.    Baseline  137 at eval,  on 09/04/2019 is is 158,    Status  Achieved      PT LONG TERM GOAL #2   Title  Pt will demonstrates 160 degrees of R shoulder abduction to allow her to reach out to the side.    Baseline  122 on eval, on 09/04/2019 it is  160    Status  Achieved      PT LONG TERM GOAL #3  Title  Pt will have no palpable cording in R antecubital fossa to allow her to reach overhead without discomfort.    Baseline  numerous cords palpable on eval, on 11/30/ 2020 cord ar palpable but less,  09/08/19- some cording palpable, 12/232020 persistent cording with little change,  11/06/2019  minor cords visible lateral to bracial vein in antecubital foss    Status  Partially Met      PT LONG TERM GOAL #4   Title  Pt will be independent in a home exercise program for continued strengthening and stretching.    Baseline  09/27/2019: pt with walking program in house at home with basic UE exercise    Status  Achieved      PT LONG TERM GOAL #5   Title  Pt will report a 75% improvement in tightness across R pec muscle to allow pt to move arm without discomfort.    Status  Achieved      PT LONG TERM GOAL #6   Title  Pt will be able to walk through the grocery store without any seated recovery periods or having to leave due to fatigue.    Status  Achieved      PT LONG TERM GOAL #7   Status  Achieved      PT LONG TERM GOAL #8   Title  Pt will be able to complete 13 reps of sit to stands in 30 seconds to decrease fall risk.    Baseline  3 reps without use of hands,  11/06/2019 Pt was able to do 12 repetitions, slight SOB    Status  Partially Met      PT LONG TERM GOAL  #9   TITLE  Pt will be able to stand on R and L leg in SLS for 12 seconds bilaterally.    Status  Deferred            Plan - 11/06/19 1140    Clinical Impression Statement  Pt is doing very well.  She has markedly improved her functional status and states she is able to do her grocery shopping without fatigue.  She tries to stay active at home She has regained full shoulder ROM, She still has minor cording in right arm and palpable firm fullness in superior aspect of right breast.  She is using compression sleeve and bra with extra foam piece added today.  She is doing her shoulder stretches.  She is ready to discharge from this episode of PT as I feel she will continue to improve on her own. She knows that she can call if she has any more problems on questions.    Personal Factors and Comorbidities   Transportation;Profession;Comorbidity 1;Comorbidity 2    Comorbidities  diabetes, ovarian cancer, caregiver for husband with dementia, sister brings her to appts    Examination-Activity Limitations  Reach Overhead;Carry;Lift    PT Treatment/Interventions  ADLs/Self Care Home Management;Therapeutic activities;Therapeutic exercise;Patient/family education;Manual lymph drainage;Manual techniques;Compression bandaging;Scar mobilization;Passive range of motion;Taping;Joint Manipulations;Neuromuscular re-education;Gait training;Balance training    PT Next Visit Plan  Discharge this episode    Consulted and Agree with Plan of Care  Patient       Patient will benefit from skilled therapeutic intervention in order to improve the following deficits and impairments:  Decreased range of motion, Decreased scar mobility, Increased edema, Decreased knowledge of precautions, Decreased strength, Increased fascial restricitons, Impaired UE functional use, Postural dysfunction, Pain, Decreased activity tolerance, Difficulty walking, Decreased balance, Decreased endurance, Decreased mobility  Visit Diagnosis: Muscle weakness (generalized)  Difficulty in walking, not elsewhere classified  Stiffness of right shoulder, not elsewhere classified  Pain in right upper arm  Lymphedema, not elsewhere classified     Problem List Patient Active Problem List   Diagnosis Date Noted  . Port-A-Cath in place 07/11/2019  . Malignant neoplasm of upper-inner quadrant of right breast in female, estrogen receptor positive ([place]) 04/19/2019  . Facial weakness 11/07/2018  . 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 History of hysterectomy 10/12/2018  . History of bilateral salpingo-oophorectomy (BSO) 10/12/2018  . Osteoarthritis of right knee 05/12/2013  . HYPERLIPIDEMIA 12/30/2007  . HYPERTENSION 12/30/2007  . INSOMNIA 12/30/2007  . HYPERSOMNIA 12/30/2007  . DM w/o Complication Type II  28/36/6294  . HYPERCHOLESTEROLEMIA 12/16/2007  . EXOGENOUS OBESITY 12/16/2007  . SLEEP APNEA, OBSTRUCTIVE 12/16/2007  . ALLERGIC RHINITIS 12/16/2007      PHYSICAL THERAPY DISCHARGE SUMMARY  Visits from Start of Care: 14  Current functional level related to goals / functional outcomes: Pt is independent in self care and symptom management   Remaining deficits: As above    Education / Equipment: Home exercise, lymphedema management  Plan: Patient agrees to discharge.  Patient goals were partially met. Patient is being discharged due to being pleased with the current functional level.  ?????     Donato Heinz. Owens Shark PT  Kathy Reese 11/06/2019, 11:44 AM  [place], [name], [address] Phone: [phone]   Fax:  [phone]  Name: Kathy Reese MRN: [id number] Date of Birth: 08/12/1951

## 2019-11-08 ENCOUNTER — Encounter: Payer: Medicare Other | Admitting: Physical Therapy

## 2019-11-08 NOTE — Progress Notes (Incomplete)
  Patient Name: Kathy Reese MRN: [id number] DOB: [birth year] Referring Physician: Nolene Ebbs (Profile Not Attached) Date of Service: 10/27/2019 [facility], [place]                                                        End Of Treatment Note  Diagnoses: C50.211-Malignant neoplasm of upper-inner quadrant of right female breast  Cancer Staging: Stage IA, (pT1b, pN0, cM0) Right Breast UIQ, Invasive Ductal Carcinoma with DCIS, ER+ / PR- / Her2+, Grade 3  Intent: Curative  Radiation Treatment Dates: 09/25/2019 through 10/27/2019 Site Technique Total Dose (Gy) Dose per Fx (Gy) Completed Fx Beam Energies  Breast, Right: Breast_Rt 3D 40.05/40.05 2.67 15/15 6X, 10X, 15X  Breast, Right: Breast_Rt_Bst specialPort 10/10 2 5/5 15E   Narrative: The patient tolerated radiation therapy relatively well. She denied fatigue and pain. She reported staying active around the house. The right breast area did show some mild hyperpigmentation changes as treatment progressed. No skin breakdown.  Plan: The patient will follow-up with radiation oncology in one month.  ________________________________________________   Blair Promise, PhD, MD  This document serves as a record of services personally performed by Gery Pray, MD. It was created on his behalf by Clerance Lav, a trained medical scribe. The creation of this record is based on the scribe's personal observations and the provider's statements to them. This document has been checked and approved by the attending provider.

## 2019-11-13 ENCOUNTER — Encounter: Payer: Medicare Other | Admitting: Physical Therapy

## 2019-11-15 ENCOUNTER — Encounter: Payer: Medicare Other | Admitting: Physical Therapy

## 2019-11-16 NOTE — Progress Notes (Signed)
69 y.o. [id number] Married Black or [place] American Not Hispanic or Latino female here for annual exam.  Patient DX with ductal carcinoma 2 masses in right breast July of 2020 Patient states that she has treatment every 21 days for her cancer.  S/p lumpectomy, chemo and radiation, on herceptin maintenece. Will start antiestrogen therapy now that radiation is done. Cancer was a 1A, she had a hard time with Chemo, but is doing well now.    She has a h/o a hysterectomy/BSO. 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 She was getting yearly CA 125, per Dr Denman George she no longer needs these.     Patient's last menstrual period was 02/02/1993 (exact date).          Sexually active: No.  The current method of family planning is post menopausal status.    Exercising: Yes.    Walking  Smoker:  no  Health Maintenance: Pap:  08/29/13 normal  History of abnormal Pap:  no MMG:  03/29/19 Density C incomplete,  04/05/19 Ultrasound right breast 2 masses  Bi-rads 4 suspicious, 04/12/19 RT breast BX both masses ductal carcinoma.  BMD:   2018 Normal  Colonoscopy: 03-22-13 WNL repeat in 10 yrs TDaP:  She will check with primary She has had her first covid vaccination.  Gardasil: no   reports that she has never smoked. She has never used smokeless tobacco. She reports current alcohol use of about 1.0 standard drinks of alcohol per week. She reports that she does not use drugs.  Husband with dementia, he had covid last month. He is with her, goes to a memory program 5 days a week. Her daughter is staying with her and helps a little.  3 grandkids, local. Daughter in law is due in September with twins.   Past Medical History:  Diagnosis Date  . Allergic rhinitis, cause unspecified   . Arthritis   . Depression   . Diverticulosis   . History of Bell's palsy   . History of diabetes mellitus   . Hypersomnia   . OSA on CPAP    not using at this time 06/15/19  . Other and unspecified  hyperlipidemia   . Personal history of malignant neoplasm of ovary   . Type II or unspecified type diabetes mellitus without mention of complication, not stated as uncontrolled   . Unspecified essential hypertension   . Vertigo     Past Surgical History:  Procedure Laterality Date  . ABDOMINAL ADHESION SURGERY  12/96   exc peritoneal cyst  . BREAST BIOPSY Left 2000   stereotactic, negative  . BREAST LUMPECTOMY WITH RADIOACTIVE SEED AND SENTINEL LYMPH NODE BIOPSY Right 05/30/2019   Procedure: RIGHT BREAST LUMPECTOMY WITH BRACKETED RADIOACTIVE SEEDS  AND SENTINEL LYMPH NODE BIOPSY;  Surgeon: Stark Klein, MD;  Location: [place];  Service: General;  Laterality: Right;  . BREAST REDUCTION SURGERY Bilateral 10/03  . EYE SURGERY Left    cataracts  . LAPAROSCOPIC SALPINGO OOPHERECTOMY Right 12/96  . 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 LAPAROSCOPY ABDOMEN DIAGNOSTIC  2/08   w/LOA, LSO  . PORTACATH PLACEMENT Left 06/16/2019   Procedure: INSERTION PORT-A-CATH;  Surgeon: Stark Klein, MD;  Location: [place];  Service: General;  Laterality: Left;  . REDUCTION MAMMAPLASTY Bilateral 2004  . TONSILLECTOMY    . TOTAL ABDOMINAL HYSTERECTOMY W/ BILATERAL SALPINGOOPHORECTOMY  5/94   secondary to uterine fibroids  . TOTAL  KNEE ARTHROPLASTY Right 05/12/2013   Procedure: TOTAL KNEE ARTHROPLASTY;  Surgeon: Alta Corning, MD;  Location: [place];  Service: Orthopedics;  Laterality: Right;  . TRIGGER FINGER RELEASE  07/06/2012   Procedure: RELEASE TRIGGER FINGER/A-1 PULLEY;  Surgeon: Alta Corning, MD;  Location: [place];  Service: Orthopedics;  Laterality: Left;  left ring finger  . TUBAL LIGATION      Current Outpatient Medications  Medication Sig Dispense Refill  . ACCU-CHEK AVIVA PLUS test strip     . Accu-Chek Softclix Lancets lancets     . aspirin EC 81 MG tablet Take 81 mg by mouth daily.    Marland Kitchen atorvastatin (LIPITOR) 40 MG tablet  Take 40 mg by mouth daily at 6 PM.     . Calcium Carb-Cholecalciferol (CALCIUM 600+D3 PO) Take 1 tablet by mouth 2 (two) times a day.    . cholecalciferol (VITAMIN D3) 25 MCG (1000 UT) tablet Take 1,000 Units by mouth daily.    . insulin detemir (LEVEMIR) 100 UNIT/ML injection Inject 12-15 Units into the skin See admin instructions. Inject 15 units subcutaneously in the morning & 12 units subcutaneously at night.    . lidocaine-prilocaine (EMLA) cream Apply to affected area once 30 g 3  . LORazepam (ATIVAN) 0.5 MG tablet Take 1 tablet (0.5 mg total) by mouth at bedtime as needed for sleep. 30 tablet 0  . losartan-hydrochlorothiazide (HYZAAR) 100-25 MG tablet Take 1 tablet by mouth daily.  4  . metFORMIN (GLUCOPHAGE) 500 MG tablet Take 500 mg by mouth every morning.      No current facility-administered medications for this visit.    Family History  Problem Relation Age of Onset  . Lung cancer Mother   . Colon cancer Father   . Stroke Brother   . Stroke Sister   . Pulmonary Hypertension Sister     Review of Systems  Neurological: Positive for numbness.       In fingers and feet during chemo.   All other systems reviewed and are negative.   Exam:   BP 118/74   Pulse (!) 55   Temp 98.4 F (36.9 C)   Ht 5\' 3"  (1.6 m)   Wt 201 lb (91.2 kg)   LMP 02/02/1993 (Exact Date)   SpO2 95%   BMI 35.61 kg/m   Weight change: @WEIGHTCHANGE @ Height:   Height: 5\' 3"  (160 cm)  Ht Readings from Last 3 Encounters:  11/20/19 5\' 3"  (1.6 m)  10/10/19 5\' 7"  (1.702 m)  08/29/19 5\' 7"  (1.702 m)    General appearance: alert, cooperative and appears stated age Head: Normocephalic, without obvious abnormality, atraumatic Neck: no adenopathy, supple, symmetrical, trachea midline and thyroid normal to inspection and palpation Lungs: clear to auscultation bilaterally Cardiovascular: regular rate and rhythm Breasts: normal appearance, no masses or tenderness, evidence of bilateral breast reduction.   Abdomen: soft, non-tender; non distended,  no masses,  no organomegaly Extremities: extremities normal, atraumatic, no cyanosis or edema Skin: Skin color, texture, turgor normal. No rashes or lesions Lymph nodes: Cervical, supraclavicular, and axillary nodes normal. No abnormal inguinal nodes palpated Neurologic: Grossly normal   Pelvic: External genitalia:  no lesions              Urethra:  normal appearing urethra with no masses, tenderness or lesions              Bartholins and Skenes: normal  Vagina: atrophic appearing vagina with normal color and discharge, no lesions              Cervix: absent               Bimanual Exam:  Uterus:  uterus absent              Adnexa: no mass, fullness, tenderness               Rectovaginal: Confirms               Anus:  normal sphincter tone, no lesions  Karmen Bongo chaperoned for the exam.  A:  Well Woman with normal exam  H/O hysterectomy, BSO  H/O breast cancer, diagnosed last year, s/p lumpectomy, chemo and radiation. Will start an antiestrogen.  P:   No pap needed  Discussed breast self exam  Discussed calcium and vit D intake  Mammogram UTD  DEXA UTD  Colonoscopy UTD

## 2019-11-17 ENCOUNTER — Other Ambulatory Visit: Payer: Self-pay

## 2019-11-18 ENCOUNTER — Other Ambulatory Visit: Payer: Self-pay | Admitting: Hematology and Oncology

## 2019-11-18 DIAGNOSIS — C50211 Malignant neoplasm of upper-inner quadrant of right female breast: Secondary | ICD-10-CM

## 2019-11-20 ENCOUNTER — Encounter: Payer: Self-pay | Admitting: Obstetrics and Gynecology

## 2019-11-20 ENCOUNTER — Other Ambulatory Visit: Payer: Self-pay

## 2019-11-20 ENCOUNTER — Encounter: Payer: Medicare Other | Admitting: Physical Therapy

## 2019-11-20 ENCOUNTER — Ambulatory Visit (INDEPENDENT_AMBULATORY_CARE_PROVIDER_SITE_OTHER): Payer: Medicare Other | Admitting: Obstetrics and Gynecology

## 2019-11-20 ENCOUNTER — Telehealth: Payer: Self-pay | Admitting: *Deleted

## 2019-11-20 VITALS — BP 118/74 | HR 55 | Temp 98.4°F | Ht 63.0 in | Wt 201.0 lb

## 2019-11-20 DIAGNOSIS — Z90710 Acquired absence of both cervix and uterus: Secondary | ICD-10-CM

## 2019-11-20 DIAGNOSIS — Z90722 Acquired absence of ovaries, bilateral: Secondary | ICD-10-CM

## 2019-11-20 DIAGNOSIS — Z8603 Personal history of neoplasm of uncertain behavior: Secondary | ICD-10-CM

## 2019-11-20 DIAGNOSIS — C50211 Malignant neoplasm of upper-inner quadrant of right female breast: Secondary | ICD-10-CM

## 2019-11-20 DIAGNOSIS — Z01419 Encounter for gynecological examination (general) (routine) without abnormal findings: Secondary | ICD-10-CM

## 2019-11-20 DIAGNOSIS — Z9079 Acquired absence of other genital organ(s): Secondary | ICD-10-CM | POA: Diagnosis not present

## 2019-11-20 DIAGNOSIS — Z170 Estrogen receptor positive status [ER+]: Secondary | ICD-10-CM

## 2019-11-20 NOTE — Patient Instructions (Signed)
EXERCISE AND DIET:  We recommended that you start or continue a regular exercise program for good health. Regular exercise means any activity that makes your heart beat faster and makes you sweat.  We recommend exercising at least 30 minutes per day at least 3 days a week, preferably 4 or 5.  We also recommend a diet low in fat and sugar.  Inactivity, poor dietary choices and obesity can cause diabetes, heart attack, stroke, and kidney damage, among others.    ALCOHOL AND SMOKING:  Women should limit their alcohol intake to no more than 7 drinks/beers/glasses of wine (combined, not each!) per week. Moderation of alcohol intake to this level decreases your risk of breast cancer and liver damage. And of course, no recreational drugs are part of a healthy lifestyle.  And absolutely no smoking or even second hand smoke. Most people know smoking can cause heart and lung diseases, but did you know it also contributes to weakening of your bones? Aging of your skin?  Yellowing of your teeth and nails?  CALCIUM AND VITAMIN D:  Adequate intake of calcium and Vitamin D are recommended.  The recommendations for exact amounts of these supplements seem to change often, but generally speaking 1,200 mg of calcium (between diet and supplement) and 800 units of Vitamin D per day seems prudent. Certain women may benefit from higher intake of Vitamin D.  If you are among these women, your doctor will have told you during your visit.    PAP SMEARS:  Pap smears, to check for cervical cancer or precancers,  have traditionally been done yearly, although recent scientific advances have shown that most women can have pap smears less often.  However, every woman still should have a physical exam from her gynecologist every year. It will include a breast check, inspection of the vulva and vagina to check for abnormal growths or skin changes, a visual exam of the cervix, and then an exam to evaluate the size and shape of the uterus and  ovaries.  And after 69 years of age, a rectal exam is indicated to check for rectal cancers. We will also provide age appropriate advice regarding health maintenance, like when you should have certain vaccines, screening for sexually transmitted diseases, bone density testing, colonoscopy, mammograms, etc.   MAMMOGRAMS:  All women over 40 years old should have a yearly mammogram. Many facilities now offer a "3D" mammogram, which may cost around $50 extra out of pocket. If possible,  we recommend you accept the option to have the 3D mammogram performed.  It both reduces the number of women who will be called back for extra views which then turn out to be normal, and it is better than the routine mammogram at detecting truly abnormal areas.    COLON CANCER SCREENING: Now recommend starting at age 45. At this time colonoscopy is not covered for routine screening until 50. There are take home tests that can be done between 45-49.   COLONOSCOPY:  Colonoscopy to screen for colon cancer is recommended for all women at age 50.  We know, you hate the idea of the prep.  We agree, BUT, having colon cancer and not knowing it is worse!!  Colon cancer so often starts as a polyp that can be seen and removed at colonscopy, which can quite literally save your life!  And if your first colonoscopy is normal and you have no family history of colon cancer, most women don't have to have it again for   10 years.  Once every ten years, you can do something that may end up saving your life, right?  We will be happy to help you get it scheduled when you are ready.  Be sure to check your insurance coverage so you understand how much it will cost.  It may be covered as a preventative service at no cost, but you should check your particular policy.      Breast Self-Awareness Breast self-awareness means being familiar with how your breasts look and feel. It involves checking your breasts regularly and reporting any changes to your  health care provider. Practicing breast self-awareness is important. A change in your breasts can be a sign of a serious medical problem. Being familiar with how your breasts look and feel allows you to find any problems early, when treatment is more likely to be successful. All women should practice breast self-awareness, including women who have had breast implants. How to do a breast self-exam One way to learn what is normal for your breasts and whether your breasts are changing is to do a breast self-exam. To do a breast self-exam: Look for Changes  1. Remove all the clothing above your waist. 2. Stand in front of a mirror in a room with good lighting. 3. Put your hands on your hips. 4. Push your hands firmly downward. 5. Compare your breasts in the mirror. Look for differences between them (asymmetry), such as: ? Differences in shape. ? Differences in size. ? Puckers, dips, and bumps in one breast and not the other. 6. Look at each breast for changes in your skin, such as: ? Redness. ? Scaly areas. 7. Look for changes in your nipples, such as: ? Discharge. ? Bleeding. ? Dimpling. ? Redness. ? A change in position. Feel for Changes Carefully feel your breasts for lumps and changes. It is best to do this while lying on your back on the floor and again while sitting or standing in the shower or tub with soapy water on your skin. Feel each breast in the following way:  Place the arm on the side of the breast you are examining above your head.  Feel your breast with the other hand.  Start in the nipple area and make  inch (2 cm) overlapping circles to feel your breast. Use the pads of your three middle fingers to do this. Apply light pressure, then medium pressure, then firm pressure. The light pressure will allow you to feel the tissue closest to the skin. The medium pressure will allow you to feel the tissue that is a little deeper. The firm pressure will allow you to feel the tissue  close to the ribs.  Continue the overlapping circles, moving downward over the breast until you feel your ribs below your breast.  Move one finger-width toward the center of the body. Continue to use the  inch (2 cm) overlapping circles to feel your breast as you move slowly up toward your collarbone.  Continue the up and down exam using all three pressures until you reach your armpit.  Write Down What You Find  Write down what is normal for each breast and any changes that you find. Keep a written record with breast changes or normal findings for each breast. By writing this information down, you do not need to depend only on memory for size, tenderness, or location. Write down where you are in your menstrual cycle, if you are still menstruating. If you are having trouble noticing differences   in your breasts, do not get discouraged. With time you will become more familiar with the variations in your breasts and more comfortable with the exam. How often should I examine my breasts? Examine your breasts every month. If you are breastfeeding, the best time to examine your breasts is after a feeding or after using a breast pump. If you menstruate, the best time to examine your breasts is 5-7 days after your period is over. During your period, your breasts are lumpier, and it may be more difficult to notice changes. When should I see my health care provider? See your health care provider if you notice:  A change in shape or size of your breasts or nipples.  A change in the skin of your breast or nipples, such as a reddened or scaly area.  Unusual discharge from your nipples.  A lump or thick area that was not there before.  Pain in your breasts.  Anything that concerns you.  

## 2019-11-20 NOTE — Progress Notes (Signed)
Patient Care Team: Nolene Ebbs, MD as PCP - General (Internal Medicine) Mauro Kaufmann, RN as Oncology Nurse Navigator Rockwell Germany, RN as Oncology Nurse Navigator  DIAGNOSIS:    ICD-10-CM   1. Malignant neoplasm of upper-inner quadrant of right breast in female, estrogen receptor positive ([place])  C50.211    Z17.0     SUMMARY OF ONCOLOGIC HISTORY: Oncology History  Malignant neoplasm of upper-inner quadrant of right breast in female, estrogen receptor positive ([place])  04/12/2019 Cancer Staging   Staging form: Breast, AJCC 8th Edition - Clinical stage from 04/12/2019: Stage IA (cT1b, cN0, cM0, G3, ER+, PR-, HER2+) - Signed by Gardenia Phlegm, NP on 04/19/2019   04/19/2019 Initial Diagnosis   Screening mammogram detected right breast asymmetry. Diagnostic mammogram and US showed 2 indeterminate right breast masses, 67m at 1 o'clock, 738mat 12 o'clock, with no axillary adenopathy. 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    05/30/2019 Surgery   Right lumpectomy (B[facility]ry Dienes IDC with DCIS, grade 3, 0.6cm, clear margins, 3 axillary lymph nodes negative.    06/06/2019 Cancer Staging   Staging form: Breast, AJCC 8th Edition - Pathologic: Stage IA (pT1b, pN0, cM0, G3, ER+, PR-, HER2+) - Signed by GuNicholas LoseMD on 06/06/2019   06/27/2019 -  Chemotherapy   The patient had PACLitaxel (TAXOL) 174 mg in sodium chloride 0.9 % 250 mL chemo infusion (</= 8051m2), 80 mg/m2 = 174 mg, Intravenous,  Once, 3 of 3 cycles Dose modification: 65 mg/m2 (original dose 80 mg/m2, Cycle 3, Reason: Dose not tolerated) Administration: 174 mg (06/27/2019), 174 mg (07/04/2019), 174 mg (07/25/2019), 174 mg (07/11/2019), 174 mg (07/18/2019), 174 mg (08/01/2019), 174 mg (08/08/2019), 174 mg (08/15/2019), 138 mg (08/22/2019) trastuzumab-anns (KANJINTI) 399 mg in sodium chloride 0.9 % 250 mL chemo infusion, 4 mg/kg = 399 mg (100 % of original dose 4 mg/kg), Intravenous,  Once, 6  of 16 cycles Dose modification: 4 mg/kg (original dose 4 mg/kg, Cycle 1, Reason: Other (see comments), Comment: [facility]im[facility] Conversion[facility] (original dose 2 mg/kg, Cycle 3, Reason: Provider Judgment), 6 mg/kg (original dose 2 mg/kg, Cycle 4, Reason: Other (see comments), Comment: switch to maintenance q3 weeks), 600 mg (original dose 2 mg/kg, Cycle 4, Reason: Other (see comments), Comment: maint q3 weeks) Administration: 399 mg (06/27/2019), 189 mg (07/04/2019), 189 mg (07/11/2019), 189 mg (07/18/2019), 189 mg (07/25/2019), 189 mg (08/01/2019), 189 mg (08/08/2019), 189 mg (08/15/2019), 189 mg (08/22/2019), 600 mg (08/29/2019), 600 mg (09/19/2019), 600 mg (10/10/2019), 600 mg (10/31/2019)  for chemotherapy treatment.    09/26/2019 - 10/27/2019 Radiation Therapy   Adjuvant radiation therapy     CHIEF COMPLIANT: Follow-up on Herceptin maintenance   INTERVAL HISTORY: Kathy Reese a 68 85o. with above-mentioned history of right breast cancerwho underwent a lumpectomy, adjuvant chemotherapy, completed radiation on 10/27/19, and is currently on Herceptin maintenance. She presents to the clinic todayfor treatment.  She has tolerated radiation fairly well and is recovering well from it.  She continues to tolerate Herceptin extremely well.  ALLERGIES:  has No Known Allergies.  MEDICATIONS:  Current Outpatient Medications  Medication Sig Dispense Refill  . ACCU-CHEK AVIVA PLUS test strip     . Accu-Chek Softclix Lancets lancets     . aspirin EC 81 MG tablet Take 81 mg by mouth daily.    . aMarland Kitchenorvastatin (LIPITOR) 40 MG tablet Take 40 mg by mouth daily at 6 PM.     .  Calcium Carb-Cholecalciferol (CALCIUM 600+D3 PO) Take 1 tablet by mouth 2 (two) times a day.    . cholecalciferol (VITAMIN D3) 25 MCG (1000 UT) tablet Take 1,000 Units by mouth daily.    . insulin detemir (LEVEMIR) 100 UNIT/ML injection Inject 12-15 Units into the skin See admin instructions. Inject 15 units subcutaneously  in the morning & 12 units subcutaneously at night.    . lidocaine-prilocaine (EMLA) cream Apply to affected area once 30 g 3  . LORazepam (ATIVAN) 0.5 MG tablet Take 1 tablet (0.5 mg total) by mouth at bedtime as needed for sleep. 30 tablet 0  . losartan-hydrochlorothiazide (HYZAAR) 100-25 MG tablet Take 1 tablet by mouth daily.  4  . metFORMIN (GLUCOPHAGE) 500 MG tablet Take 500 mg by mouth every morning.      No current facility-administered medications for this visit.    PHYSICAL EXAMINATION: ECOG PERFORMANCE STATUS: 1 - Symptomatic but completely ambulatory  Vitals:   11/21/19 0900  BP: (!) 146/52  Pulse: (!) 55  Resp: 18  Temp: 98.2 F (36.8 C)  SpO2: 99%   Filed Weights   11/21/19 0900  Weight: 202 lb 9.6 oz (91.9 kg)    LABORATORY DATA:  I have reviewed the data as listed CMP Latest Ref Rng & Units 10/10/2019 09/19/2019 08/29/2019  Glucose 70 - 99 mg/dL 158(H) 116(H) 212(H)  BUN 8 - 23 mg/dL 24(H) 24(H) 23  Creatinine 0.44 - 1.00 mg/dL 0.83 0.91 1.08(H)  Sodium 135 - 145 mmol/L 137 141 136  Potassium 3.5 - 5.1 mmol/L 4.0 3.9 4.0  Chloride 98 - 111 mmol/L 102 103 100  CO2 22 - 32 mmol/L 26 28 24   Calcium 8.9 - 10.3 mg/dL 9.4 9.8 9.1  Total Protein 6.5 - 8.1 g/dL 7.0 7.3 6.9  Total Bilirubin 0.3 - 1.2 mg/dL 1.1 0.8 0.9  Alkaline Phos 38 - 126 U/L 99 121 110  AST 15 - 41 U/L 27 31 35  ALT 0 - 44 U/L 28 32 45(H)    Lab Results  Component Value Date   WBC 5.0 11/21/2019   HGB 11.0 (L) 11/21/2019   HCT 34.4 (L) 11/21/2019   MCV 93.0 11/21/2019   PLT 155 11/21/2019   NEUTROABS 3.2 11/21/2019    ASSESSMENT & PLAN:  Malignant neoplasm of upper-inner quadrant of right breast in female, estrogen receptor positive ([place]) 04/19/2019:Screening mammogram detected right breast asymmetry. Diagnostic mammogram and US showed 2 indeterminate right breast masses, 73m at 1 o'clock, 739mat 12 o'clock, with no axillary adenopathy. Biopsy confirmed IDC, grade 3, HER-2 positive (3+),  ER+ (80%), PR -, Ki67 40%. Stage Ia  05/30/2019: [facility], grade 3, 0.6cm, clear margins, 3 axillary lymph nodes negative.HER-2 positive (3+), ER+ (80%), PR -, Ki67 40%. Stage Ia  Treatment plan: 1.Adjuvant chemo with Taxol Herceptin x9 cycles(stopped for neuropathy) 06/27/2019-08/22/2019 2. Adjuvant radiation therapy 09/26/2019- 10/27/19 3. 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  Monitoring  Return to clinic every 3 weeks for Herceptin every 6 weeks to follow-up with me. Since radiation has been completed, recommended starting her antiestrogen therapy.  Anastrozole counseling:We discussed the risks and benefits of anti-estrogen therapy with aromatase inhibitors. These include but not limited to insomnia, hot flashes, mood changes, vaginal dryness, bone density loss, and weight gain. We  strongly believe that the benefits far outweigh the risks. Patient understands these risks and consented to starting treatment. Planned treatment duration is 7 years.  We discussed about the antiestrogen therapy compliance study Return to clinic every 3 weeks for Herceptin every 6 weeks for follow-up with me.   No orders of the defined types were placed in this encounter.  The patient has a good understanding of the overall plan. she agrees with it. she will call with any problems that may develop before the next visit here.  Total time spent: 30 mins including face to face time and time spent for planning, charting  and coordination of care  Kathy Lose, MD 11/21/2019  I, Cloyde Reams Dorshimer, am acting as scribe for Dr. Nicholas Reese.  I have reviewed the above documentation for accuracy and completeness, and I agree with the above.

## 2019-11-20 NOTE — Telephone Encounter (Signed)
CALLED PATIENT TO ALTER FU ON 11-27-19 MOVED APPT. TO 11-30-19 @ 2:30 PM, PATIENT AGREED TO NEW DATE AND TIME

## 2019-11-21 ENCOUNTER — Inpatient Hospital Stay: Payer: Medicare Other

## 2019-11-21 ENCOUNTER — Encounter (INDEPENDENT_AMBULATORY_CARE_PROVIDER_SITE_OTHER): Payer: Self-pay

## 2019-11-21 ENCOUNTER — Inpatient Hospital Stay: Payer: Medicare Other | Attending: Hematology and Oncology

## 2019-11-21 ENCOUNTER — Other Ambulatory Visit: Payer: Self-pay

## 2019-11-21 ENCOUNTER — Inpatient Hospital Stay: Payer: Medicare Other | Admitting: Hematology and Oncology

## 2019-11-21 ENCOUNTER — Encounter: Payer: Self-pay | Admitting: *Deleted

## 2019-11-21 DIAGNOSIS — C50211 Malignant neoplasm of upper-inner quadrant of right female breast: Secondary | ICD-10-CM

## 2019-11-21 DIAGNOSIS — Z5112 Encounter for antineoplastic immunotherapy: Secondary | ICD-10-CM | POA: Diagnosis present

## 2019-11-21 DIAGNOSIS — Z170 Estrogen receptor positive status [ER+]: Secondary | ICD-10-CM | POA: Diagnosis not present

## 2019-11-21 LAB — CMP (CANCER CENTER ONLY)
ALT: 27 U/L (ref 0–44)
AST: 22 U/L (ref 15–41)
Albumin: 3.8 g/dL (ref 3.5–5.0)
Alkaline Phosphatase: 108 U/L (ref 38–126)
Anion gap: 9 (ref 5–15)
BUN: 25 mg/dL — ABNORMAL HIGH (ref 8–23)
CO2: 26 mmol/L (ref 22–32)
Calcium: 9.1 mg/dL (ref 8.9–10.3)
Chloride: 107 mmol/L (ref 98–111)
Creatinine: 0.83 mg/dL (ref 0.44–1.00)
GFR, Est AFR Am: >60 mL/min (ref >60)
GFR, Estimated: >60 mL/min (ref >60)
Glucose, Bld: 130 mg/dL — ABNORMAL HIGH (ref 70–99)
Potassium: 3.9 mmol/L (ref 3.5–5.1)
Sodium: 142 mmol/L (ref 135–145)
Total Bilirubin: 0.7 mg/dL (ref 0.3–1.2)
Total Protein: 6.8 g/dL (ref 6.5–8.1)

## 2019-11-21 LAB — CBC WITH DIFFERENTIAL (CANCER CENTER ONLY)
Abs Immature Granulocytes: 0 10*3/uL (ref 0.00–0.07)
Basophils Absolute: 0 10*3/uL (ref 0.0–0.1)
Basophils Relative: 0 %
Eosinophils Absolute: 0.2 10*3/uL (ref 0.0–0.5)
Eosinophils Relative: 3 %
HCT: 34.4 % — ABNORMAL LOW (ref 36.0–46.0)
Hemoglobin: 11 g/dL — ABNORMAL LOW (ref 12.0–15.0)
Immature Granulocytes: 0 %
Lymphocytes Relative: 22 %
Lymphs Abs: 1.1 10*3/uL (ref 0.7–4.0)
MCH: 29.7 pg (ref 26.0–34.0)
MCHC: 32 g/dL (ref 30.0–36.0)
MCV: 93 fL (ref 80.0–100.0)
Monocytes Absolute: 0.5 10*3/uL (ref 0.1–1.0)
Monocytes Relative: 10 %
Neutro Abs: 3.2 10*3/uL (ref 1.7–7.7)
Neutrophils Relative %: 65 %
Platelet Count: 155 10*3/uL (ref 150–400)
RBC: 3.7 MIL/uL — ABNORMAL LOW (ref 3.87–5.11)
RDW: 13.6 % (ref 11.5–15.5)
WBC Count: 5 10*3/uL (ref 4.0–10.5)
nRBC: 0 % (ref 0.0–0.2)

## 2019-11-21 MED ORDER — ACETAMINOPHEN 325 MG PO TABS
ORAL_TABLET | ORAL | Status: AC
  Filled 2019-11-21: qty 2

## 2019-11-21 MED ORDER — LORAZEPAM 0.5 MG PO TABS: 0.5000 mg | ORAL_TABLET | Freq: Every evening | ORAL | 3 refills | Status: DC | PRN

## 2019-11-21 MED ORDER — SODIUM CHLORIDE 0.9% FLUSH
10.0000 mL | INTRAVENOUS | Status: DC | PRN
  Administered 2019-11-21: 10 mL
  Filled 2019-11-21: qty 10

## 2019-11-21 MED ORDER — ANASTROZOLE 1 MG PO TABS: 1.0000 mg | ORAL_TABLET | Freq: Every day | ORAL | 3 refills | Status: DC

## 2019-11-21 MED ORDER — DIPHENHYDRAMINE HCL 25 MG PO CAPS
50.0000 mg | ORAL_CAPSULE | Freq: Once | ORAL | Status: AC
  Administered 2019-11-21: 50 mg via ORAL

## 2019-11-21 MED ORDER — TRASTUZUMAB-ANNS CHEMO 150 MG IV SOLR
600.0000 mg | Freq: Once | INTRAVENOUS | Status: AC
  Administered 2019-11-21: 600 mg via INTRAVENOUS
  Filled 2019-11-21: qty 28.57

## 2019-11-21 MED ORDER — SODIUM CHLORIDE 0.9 % IV SOLN
Freq: Once | INTRAVENOUS | Status: AC
  Filled 2019-11-21: qty 250

## 2019-11-21 MED ORDER — HEPARIN SOD (PORK) LOCK FLUSH 100 UNIT/ML IV SOLN
500.0000 [IU] | Freq: Once | INTRAVENOUS | Status: AC | PRN
  Administered 2019-11-21: 500 [IU]
  Filled 2019-11-21: qty 5

## 2019-11-21 MED ORDER — ACETAMINOPHEN 325 MG PO TABS
650.0000 mg | ORAL_TABLET | Freq: Once | ORAL | Status: AC
  Administered 2019-11-21: 650 mg via ORAL

## 2019-11-21 MED ORDER — DIPHENHYDRAMINE HCL 25 MG PO CAPS
ORAL_CAPSULE | ORAL | Status: AC
  Filled 2019-11-21: qty 2

## 2019-11-21 NOTE — Assessment & Plan Note (Signed)
04/19/2019:Screening mammogram detected right breast asymmetry. Diagnostic mammogram and US showed 2 indeterminate right breast masses, 23m at 1 o'clock, 736mat 12 o'clock, with no axillary adenopathy. 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 Stage Ia  05/30/2019: [facility], grade 3, 0.6cm, clear margins, 3 axillary lymph nodes negative.HER-2 positive (3+), ER+ (80%), PR -, Ki67 40%. Stage Ia  Treatment plan: 1.Adjuvant chemo with Taxol Herceptin x9 cycles(stopped for neuropathy) 06/27/2019-08/22/2019 2. Adjuvant radiation therapy 09/26/2019- 10/27/19 3. 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  Monitoring  Return to clinic every 3 weeks for Herceptin every 6 weeks to follow-up with me. Since radiation has been completed, recommended starting her antiestrogen therapy.  Anastrozole counseling:We discussed the risks and benefits of anti-estrogen therapy with aromatase inhibitors. These include but not limited to insomnia, hot flashes, mood changes, vaginal dryness, bone density loss, and weight gain. We strongly believe that the benefits far outweigh the risks. Patient understands these risks and consented to starting treatment. Planned treatment duration is 7 years.  We discussed about the antiestrogen therapy compliance study Return to clinic in 3 months for survivorship care plan visit

## 2019-11-21 NOTE — Patient Instructions (Signed)
[facility] °Discharge Instructions for Patients Receiving Chemotherapy ° °Today you received the following chemotherapy agents Trastuzumab ° °To help prevent nausea and vomiting after your treatment, we encourage you to take your nausea medication as directed. °  °If you develop nausea and vomiting that is not controlled by your nausea medication, call the clinic.  ° °BELOW ARE SYMPTOMS THAT SHOULD BE REPORTED IMMEDIATELY: °· *FEVER GREATER THAN 100.5 F °· *CHILLS WITH OR WITHOUT FEVER °· NAUSEA AND VOMITING THAT IS NOT CONTROLLED WITH YOUR NAUSEA MEDICATION °· *UNUSUAL SHORTNESS OF BREATH °· *UNUSUAL BRUISING OR BLEEDING °· TENDERNESS IN MOUTH AND THROAT WITH OR WITHOUT PRESENCE OF ULCERS °· *URINARY PROBLEMS °· *BOWEL PROBLEMS °· UNUSUAL RASH °Items with * indicate a potential emergency and should be followed up as soon as possible. ° °Feel free to call the clinic should you have any questions or concerns. The clinic phone number is [phone]. ° °Please show the CHEMO ALERT CARD at check-in to the Emergency Department and triage nurse. ° ° °

## 2019-11-22 ENCOUNTER — Encounter: Payer: Medicare Other | Admitting: Physical Therapy

## 2019-11-27 ENCOUNTER — Encounter: Payer: Medicare Other | Admitting: Physical Therapy

## 2019-11-27 ENCOUNTER — Ambulatory Visit: Payer: Medicare Other | Admitting: Radiation Oncology

## 2019-11-27 ENCOUNTER — Encounter: Payer: Self-pay | Admitting: *Deleted

## 2019-11-29 ENCOUNTER — Encounter: Payer: Medicare Other | Admitting: Physical Therapy

## 2019-11-30 ENCOUNTER — Encounter: Payer: Self-pay | Admitting: Radiation Oncology

## 2019-11-30 ENCOUNTER — Other Ambulatory Visit: Payer: Self-pay

## 2019-11-30 ENCOUNTER — Ambulatory Visit
Admission: RE | Admit: 2019-11-30 | Discharge: 2019-11-30 | Disposition: A | Discharge status: Home / Self Care | Payer: Medicare Other | Source: Ambulatory Visit | Attending: Radiation Oncology | Admitting: Radiation Oncology

## 2019-11-30 VITALS — BP 146/60 | HR 71 | Temp 98.5°F | Resp 20 | Wt 204.0 lb

## 2019-11-30 DIAGNOSIS — Z7982 Long term (current) use of aspirin: Secondary | ICD-10-CM | POA: Diagnosis not present

## 2019-11-30 DIAGNOSIS — R5383 Other fatigue: Secondary | ICD-10-CM | POA: Diagnosis not present

## 2019-11-30 DIAGNOSIS — Z794 Long term (current) use of insulin: Secondary | ICD-10-CM | POA: Insufficient documentation

## 2019-11-30 DIAGNOSIS — Z79899 Other long term (current) drug therapy: Secondary | ICD-10-CM | POA: Insufficient documentation

## 2019-11-30 DIAGNOSIS — Z923 Personal history of irradiation: Secondary | ICD-10-CM | POA: Insufficient documentation

## 2019-11-30 DIAGNOSIS — Z170 Estrogen receptor positive status [ER+]: Secondary | ICD-10-CM | POA: Diagnosis not present

## 2019-11-30 DIAGNOSIS — C50211 Malignant neoplasm of upper-inner quadrant of right female breast: Secondary | ICD-10-CM | POA: Insufficient documentation

## 2019-11-30 NOTE — Progress Notes (Signed)
Patient in for follow up doing well started her AI last week. Doing well fatigue is better. Has some lumps in breast question possible scar tissue.

## 2019-11-30 NOTE — Patient Instructions (Signed)
Coronavirus (COVID-19) Are you at risk?  Are you at risk for the Coronavirus (COVID-19)?  To be considered HIGH RISK for Coronavirus (COVID-19), you have to meet the following criteria:  . Traveled to [place], [place], [place], [place] or [place]; or in the [place] to [place], [place], [place], or [place]; and have fever, cough, and shortness of breath within the last 2 weeks of travel OR . Been in close contact with a person diagnosed with COVID-19 within the last 2 weeks and have fever, cough, and shortness of breath . IF YOU DO NOT MEET THESE CRITERIA, YOU ARE CONSIDERED LOW RISK FOR COVID-19.  What to do if you are HIGH RISK for COVID-19?  . If you are having a medical emergency, call 911. . Seek medical care right away. Before you go to a doctor's office, urgent care or emergency department, call ahead and tell them about your recent travel, contact with someone diagnosed with COVID-19, and your symptoms. You should receive instructions from your physician's office regarding next steps of care.  . When you arrive at healthcare provider, tell the healthcare staff immediately you have returned from visiting [place], [place], [place], [place] or [place]; or traveled in the [place] to [place], [place], [place], or [place]; in the last two weeks or you have been in close contact with a person diagnosed with COVID-19 in the last 2 weeks.   . Tell the health care staff about your symptoms: fever, cough and shortness of breath. . After you have been seen by a medical provider, you will be either: o Tested for (COVID-19) and discharged home on quarantine except to seek medical care if symptoms worsen, and asked to  - Stay home and avoid contact with others until you get your results (4-5 days)  - Avoid travel on public transportation if possible (such as bus, train, or airplane) or o Sent to the Emergency Department by EMS for evaluation, COVID-19 testing, and possible  admission depending on your condition and test results.  What to do if you are LOW RISK for COVID-19?  Reduce your risk of any infection by using the same precautions used for avoiding the common cold or flu:  . Wash your hands often with soap and warm water for at least 20 seconds.  If soap and water are not readily available, use an alcohol-based hand sanitizer with at least 60% alcohol.  . If coughing or sneezing, cover your mouth and nose by coughing or sneezing into the elbow areas of your shirt or coat, into a tissue or into your sleeve (not your hands). . Avoid shaking hands with others and consider head nods or verbal greetings only. . Avoid touching your eyes, nose, or mouth with unwashed hands.  . Avoid close contact with people who are sick. . Avoid places or events with large numbers of people in one location, like concerts or sporting events. . Carefully consider travel plans you have or are making. . If you are planning any travel outside or inside the [place], visit the CDC's Travelers' Health webpage for the latest health notices. . If you have some symptoms but not all symptoms, continue to monitor at home and seek medical attention if your symptoms worsen. . If you are having a medical emergency, call 911.   ADDITIONAL HEALTHCARE OPTIONS FOR PATIENTS  [facility] / e-Visit: [email or web address]         [facility]: [phone]  [facility]: [phone]                   [facility]: [phone]   

## 2019-11-30 NOTE — Progress Notes (Signed)
Radiation Oncology         [phone] ________________________________  Name: Kathy Reese MRN: [id number]  Date: 11/30/2019  DOB: 1951-02-16  Follow-Up Visit Note  CC: Kathy Ebbs, MD  Kathy Ebbs, MD    ICD-10-CM   1. Malignant neoplasm of upper-inner quadrant of right breast in female, estrogen receptor positive ([place])  C50.211    Z17.0     Diagnosis:   StageIA, (pT1b,pN0, cM0) RightBreast UIQ,Invasive DuctalCarcinomawith DCIS, ER+/ PR-/ Her2+, Grade3  Interval Since Last Radiation:  1 months  09/25/2019 through 10/27/2019 Site Technique Total Dose (Gy) Dose per Fx (Gy) Completed Fx Beam Energies  Breast, Right: Breast_Rt 3D 40.05/40.05 2.67 15/15 6X, 10X, 15X  Breast, Right: Breast_Rt_Bst specialPort 10/10 2 5/5 15E    Narrative:  The patient returns today for routine follow-up. She last saw Dr. Lindi Reese on 11/21/2019 and was started on anastrozole. She also continues on maintenance Herceptin.  On review of systems, she denies any pain within the breast significant itching.  She denies any nipple discharge or bleeding.  She has minimal fatigue at this time  ALLERGIES:  has No Known Allergies.  Meds: Current Outpatient Medications  Medication Sig Dispense Refill  . ACCU-CHEK AVIVA PLUS test strip     . Accu-Chek Softclix Lancets lancets     . anastrozole (ARIMIDEX) 1 MG tablet Take 1 tablet (1 mg total) by mouth daily. 90 tablet 3  . aspirin EC 81 MG tablet Take 81 mg by mouth daily.    Marland Kitchen atorvastatin (LIPITOR) 40 MG tablet Take 40 mg by mouth daily at 6 PM.     . Calcium Carb-Cholecalciferol (CALCIUM 600+D3 PO) Take 1 tablet by mouth 2 (two) times a day.    . cholecalciferol (VITAMIN D3) 25 MCG (1000 UT) tablet Take 1,000 Units by mouth daily.    . insulin detemir (LEVEMIR) 100 UNIT/ML injection Inject 12-15 Units into the skin See admin instructions. Inject 15 units subcutaneously in the morning & 12 units subcutaneously at night.    .  losartan-hydrochlorothiazide (HYZAAR) 100-25 MG tablet Take 1 tablet by mouth daily.  4  . metFORMIN (GLUCOPHAGE) 500 MG tablet Take 500 mg by mouth every morning.     . lidocaine-prilocaine (EMLA) cream Apply to affected area once (Patient not taking: Reported on 11/30/2019) 30 g 3  . LORazepam (ATIVAN) 0.5 MG tablet TAKE 1 TABLET BY MOUTH AT BEDTIME AS NEEDED FOR SLEEP (Patient not taking: Reported on 11/30/2019) 30 tablet 0   No current facility-administered medications for this encounter.    Physical Findings: The patient is in no acute distress. Patient is alert and oriented.  weight is 204 lb (92.5 kg). Her temperature is 98.5 F (36.9 C). Her blood pressure is 146/60 (abnormal) and her pulse is 71. Her respiration is 20 and oxygen saturation is 100%. .  No significant changes. Lungs are clear to auscultation bilaterally. Heart has regular rate and rhythm. No palpable cervical, supraclavicular, or axillary adenopathy. Abdomen soft, non-tender, normal bowel sounds. Left Breast: no palpable mass, nipple discharge or bleeding. Right Breast: Hyperpigmentation changes noted.  Some induration at the lumpectomy site.  The patient skin is healed well.  No palpable or visible signs of recurrence  Lab Findings: Lab Results  Component Value Date   WBC 5.0 11/21/2019   HGB 11.0 (L) 11/21/2019   HCT 34.4 (L) 11/21/2019   MCV 93.0 11/21/2019   PLT 155 11/21/2019    Radiographic Findings: No results found.  Impression:  The  patient is recovering from the effects of radiation.  No evidence of recurrence on clinical exam today  Plan: Patient would like to follow-up with radiation oncology.  in 3 months we will schedule this appointment for her.  She will continue on adjuvant hormonal therapy as above  ____________________________________ Gery Pray, MD    This document serves as a record of services personally performed by Gery Pray, MD. It was created on his behalf by Wilburn Mylar,  a trained medical scribe. The creation of this record is based on the scribe's personal observations and the provider's statements to them. This document has been checked and approved by the attending provider.

## 2019-12-05 ENCOUNTER — Encounter (INDEPENDENT_AMBULATORY_CARE_PROVIDER_SITE_OTHER): Payer: Self-pay

## 2019-12-12 ENCOUNTER — Encounter (INDEPENDENT_AMBULATORY_CARE_PROVIDER_SITE_OTHER): Payer: Self-pay

## 2019-12-12 ENCOUNTER — Other Ambulatory Visit: Payer: Self-pay | Admitting: *Deleted

## 2019-12-12 ENCOUNTER — Other Ambulatory Visit: Payer: Self-pay

## 2019-12-12 ENCOUNTER — Inpatient Hospital Stay: Payer: Medicare Other | Attending: Hematology and Oncology

## 2019-12-12 VITALS — BP 158/42 | HR 50 | Temp 98.2°F | Resp 18 | Wt 204.0 lb

## 2019-12-12 DIAGNOSIS — G4733 Obstructive sleep apnea (adult) (pediatric): Secondary | ICD-10-CM

## 2019-12-12 DIAGNOSIS — Z170 Estrogen receptor positive status [ER+]: Secondary | ICD-10-CM | POA: Diagnosis not present

## 2019-12-12 DIAGNOSIS — C50211 Malignant neoplasm of upper-inner quadrant of right female breast: Secondary | ICD-10-CM

## 2019-12-12 DIAGNOSIS — Z5112 Encounter for antineoplastic immunotherapy: Secondary | ICD-10-CM | POA: Insufficient documentation

## 2019-12-12 DIAGNOSIS — E7800 Pure hypercholesterolemia, unspecified: Secondary | ICD-10-CM

## 2019-12-12 MED ORDER — DIPHENHYDRAMINE HCL 25 MG PO CAPS
ORAL_CAPSULE | ORAL | Status: AC
  Filled 2019-12-12: qty 2

## 2019-12-12 MED ORDER — HEPARIN SOD (PORK) LOCK FLUSH 100 UNIT/ML IV SOLN
500.0000 [IU] | Freq: Once | INTRAVENOUS | Status: AC | PRN
  Administered 2019-12-12: 500 [IU]
  Filled 2019-12-12: qty 5

## 2019-12-12 MED ORDER — ACETAMINOPHEN 325 MG PO TABS
650.0000 mg | ORAL_TABLET | Freq: Once | ORAL | Status: AC
  Administered 2019-12-12: 650 mg via ORAL

## 2019-12-12 MED ORDER — ACETAMINOPHEN 325 MG PO TABS
ORAL_TABLET | ORAL | Status: AC
  Filled 2019-12-12: qty 2

## 2019-12-12 MED ORDER — SODIUM CHLORIDE 0.9% FLUSH
10.0000 mL | INTRAVENOUS | Status: DC | PRN
  Administered 2019-12-12: 10 mL
  Filled 2019-12-12: qty 10

## 2019-12-12 MED ORDER — DIPHENHYDRAMINE HCL 25 MG PO CAPS
50.0000 mg | ORAL_CAPSULE | Freq: Once | ORAL | Status: AC
  Administered 2019-12-12: 50 mg via ORAL

## 2019-12-12 MED ORDER — TRASTUZUMAB-ANNS CHEMO 150 MG IV SOLR
600.0000 mg | Freq: Once | INTRAVENOUS | Status: AC
  Administered 2019-12-12: 600 mg via INTRAVENOUS
  Filled 2019-12-12: qty 28.57

## 2019-12-12 MED ORDER — SODIUM CHLORIDE 0.9 % IV SOLN
Freq: Once | INTRAVENOUS | Status: AC
  Filled 2019-12-12: qty 250

## 2019-12-12 NOTE — Patient Instructions (Signed)
[facility] Discharge Instructions for Patients Receiving Chemotherapy  Today you received the following chemotherapy agents Trasztuzumab. To help prevent nausea and vomiting after your treatment, we encourage you to take your nausea medication as directed.   If you develop nausea and vomiting that is not controlled by your nausea medication, call the clinic.   BELOW ARE SYMPTOMS THAT SHOULD BE REPORTED IMMEDIATELY:  *FEVER GREATER THAN 100.5 F  *CHILLS WITH OR WITHOUT FEVER  NAUSEA AND VOMITING THAT IS NOT CONTROLLED WITH YOUR NAUSEA MEDICATION  *UNUSUAL SHORTNESS OF BREATH  *UNUSUAL BRUISING OR BLEEDING  TENDERNESS IN MOUTH AND THROAT WITH OR WITHOUT PRESENCE OF ULCERS  *URINARY PROBLEMS  *BOWEL PROBLEMS  UNUSUAL RASH Items with * indicate a potential emergency and should be followed up as soon as possible.  Feel free to call the clinic you have any questions or concerns. The clinic phone number is [phone].  Please show the [place] at check-in to the Emergency Department and triage nurse.

## 2019-12-18 ENCOUNTER — Telehealth: Payer: Self-pay | Admitting: *Deleted

## 2019-12-18 NOTE — Telephone Encounter (Signed)
Called to make pt aware of echocardiogram appt at [facility] @9am  on 12/21/19. Advised to arrive 15 min early. Pt verbalized understanding.

## 2019-12-21 ENCOUNTER — Other Ambulatory Visit: Payer: Self-pay

## 2019-12-21 ENCOUNTER — Encounter (INDEPENDENT_AMBULATORY_CARE_PROVIDER_SITE_OTHER): Payer: Self-pay

## 2019-12-21 ENCOUNTER — Ambulatory Visit (HOSPITAL_COMMUNITY)
Admission: RE | Admit: 2019-12-21 | Discharge: 2019-12-21 | Disposition: A | Discharge status: Home / Self Care | Payer: Medicare Other | Source: Ambulatory Visit | Attending: Adult Health | Admitting: Adult Health

## 2019-12-21 DIAGNOSIS — I10 Essential (primary) hypertension: Secondary | ICD-10-CM | POA: Insufficient documentation

## 2019-12-21 DIAGNOSIS — Z09 Encounter for follow-up examination after completed treatment for conditions other than malignant neoplasm: Secondary | ICD-10-CM | POA: Diagnosis not present

## 2019-12-21 DIAGNOSIS — G4733 Obstructive sleep apnea (adult) (pediatric): Secondary | ICD-10-CM | POA: Diagnosis not present

## 2019-12-21 DIAGNOSIS — Z01818 Encounter for other preprocedural examination: Secondary | ICD-10-CM | POA: Diagnosis not present

## 2019-12-21 DIAGNOSIS — E119 Type 2 diabetes mellitus without complications: Secondary | ICD-10-CM | POA: Insufficient documentation

## 2019-12-21 DIAGNOSIS — I081 Rheumatic disorders of both mitral and tricuspid valves: Secondary | ICD-10-CM | POA: Insufficient documentation

## 2019-12-21 DIAGNOSIS — Z170 Estrogen receptor positive status [ER+]: Secondary | ICD-10-CM | POA: Diagnosis not present

## 2019-12-21 DIAGNOSIS — C50211 Malignant neoplasm of upper-inner quadrant of right female breast: Secondary | ICD-10-CM | POA: Insufficient documentation

## 2019-12-21 DIAGNOSIS — E7800 Pure hypercholesterolemia, unspecified: Secondary | ICD-10-CM

## 2019-12-21 NOTE — Progress Notes (Signed)
  Echocardiogram 2D Echocardiogram has been performed.  Darlina Sicilian M 12/21/2019, 9:35 AM

## 2020-01-01 NOTE — Assessment & Plan Note (Addendum)
04/19/2019:Screening mammogram detected right breast asymmetry. Diagnostic mammogram and US showed 2 indeterminate right breast masses, 60m at 1 o'clock, 711mat 12 o'clock, with no axillary adenopathy. 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 Stage Ia  05/30/2019: Right [facility], grade 3, 0.6cm, clear margins, 3 axillary lymph nodes negative.HER-2 positive (3+), ER+ (80%), PR -, Ki67 40%. Stage Ia  Treatment plan: 1.Adjuvant chemo with Taxol Herceptin x9 cycles(stopped for neuropathy) 06/27/2019-08/22/2019 2. Adjuvant radiation therapy 09/26/2019- 10/27/19 3. Adjuvant antiestrogen therapy starting 11/21/2019 ------------------------------------------------------------------------------------------------------------------------------------------------------- Current treatment: Herceptin maintenance therapy  12/21/2019: Echocardiogram EF 60 to 65%  Chemo toxicities: 1.Chemotherapy-induced anemia: Monitoring closely 2.Herceptin-induced diarrhea: Stable on Imodium 3.Chemo-induced peripheral neuropathy: grade 1, improving  Kathy Reese is doing well.  Her labs are stable and she is tolerating the anastrozole well. She will continue taking Anastrozole daily and receiving Herceptin every 3 weeks.  I placed orders for her mammogram and bone density to be completed in July of this year.  I also requested her f/u appointments, along with SCP visit.  She understands her upcoming appointments and schedule.

## 2020-01-01 NOTE — Progress Notes (Signed)
[facility] Follow up:    Kathy Ebbs, MD [address] [place] [address]   DIAGNOSIS: Cancer Staging Malignant neoplasm of upper-inner quadrant of right breast in female, estrogen receptor positive ([place]) Staging form: Breast, AJCC 8th Edition - Clinical stage from 04/12/2019: Stage IA (cT1b, cN0, cM0, G3, ER+, PR-, HER2+) - Signed by Gardenia Phlegm, NP on 04/19/2019 - Pathologic: Stage IA (pT1b, pN0, cM0, G3, ER+, PR-, HER2+) - Signed by Nicholas Lose, MD on 06/06/2019   SUMMARY OF ONCOLOGIC HISTORY: Oncology History  Malignant neoplasm of upper-inner quadrant of right breast in female, estrogen receptor positive ([place])  04/12/2019 Cancer Staging   Staging form: Breast, AJCC 8th Edition - Clinical stage from 04/12/2019: Stage IA (cT1b, cN0, cM0, G3, ER+, PR-, HER2+) - Signed by Gardenia Phlegm, NP on 04/19/2019   04/19/2019 Initial Diagnosis   Screening mammogram detected right breast asymmetry. Diagnostic mammogram and US showed 2 indeterminate right breast masses, 48m at 1 o'clock, 757mat 12 o'clock, with no axillary adenopathy. 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    05/30/2019 Surgery   Right lumpectomy ([facility] IDC with DCIS, grade 3, 0.6cm, clear margins, 3 axillary lymph nodes negative.    06/06/2019 Cancer Staging   Staging form: Breast, AJCC 8th Edition - Pathologic: Stage IA (pT1b, pN0, cM0, G3, ER+, PR-, HER2+) - Signed by GuNicholas LoseMD on 06/06/2019   06/27/2019 -  Chemotherapy   The patient had PACLitaxel (TAXOL) 174 mg in sodium chloride 0.9 % 250 mL chemo infusion (</= 8070m2), 80 mg/m2 = 174 mg, Intravenous,  Once, 3 of 3 cycles Dose modification: 65 mg/m2 (original dose 80 mg/m2, Cycle 3, Reason: Dose not tolerated) Administration: 174 mg (06/27/2019), 174 mg (07/04/2019), 174 mg (07/25/2019), 174 mg (07/11/2019), 174 mg (07/18/2019), 174 mg (08/01/2019), 174 mg (08/08/2019), 174 mg  (08/15/2019), 138 mg (08/22/2019) trastuzumab-anns (KANJINTI) 399 mg in sodium chloride 0.9 % 250 mL chemo infusion, 4 mg/kg = 399 mg (100 % of original dose 4 mg/kg), Intravenous,  Once, 9 of 16 cycles Dose modification: 4 mg/kg (original dose 4 mg/kg, Cycle 1, Reason: Other (see comments), Comment: Biosimilar Conversion; [facility] mg/kg (original dose 2 mg/kg, Cycle 3, Reason: Provider Judgment), 6 mg/kg (original dose 2 mg/kg, Cycle 4, Reason: Other (see comments), Comment: switch to maintenance q3 weeks), 600 mg (original dose 2 mg/kg, Cycle 4, Reason: Other (see comments), Comment: maint q3 weeks) Administration: 399 mg (06/27/2019), 189 mg (07/04/2019), 189 mg (07/11/2019), 189 mg (07/18/2019), 189 mg (07/25/2019), 189 mg (08/01/2019), 189 mg (08/08/2019), 189 mg (08/15/2019), 189 mg (08/22/2019), 600 mg (08/29/2019), 600 mg (09/19/2019), 600 mg (10/10/2019), 600 mg (10/31/2019), 600 mg (11/21/2019), 600 mg (12/12/2019), 600 mg (01/02/2020)  for chemotherapy treatment.    09/26/2019 - 10/27/2019 Radiation Therapy   Adjuvant radiation therapy     CURRENT THERAPY: Herceptin and Anastrozole  INTERVAL HISTORY: Kathy Reese [age]o. female returns for evaluation prior to receiving Herceptin.  She is doing well.  She notes she has mild residual numbness in her fingertips and toes.  She is taking Anastrozole daily.  She has some tolerable hot flashes that she is managing.  Otherwise she is tolerating the anastrozole well.  She is exercising by walking and yard work.  She has a rose garden that she is doing with her sister.     Patient Active Problem List   Diagnosis Date Noted  . Port-A-Cath in  place 07/11/2019  . Malignant neoplasm of upper-inner quadrant of right breast in female, estrogen receptor positive ([place]) 04/19/2019  . Facial weakness 11/07/2018  . 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 History of hysterectomy 10/12/2018  . History of bilateral  salpingo-oophorectomy (BSO) 10/12/2018  . Osteoarthritis of right knee 05/12/2013  . HYPERLIPIDEMIA 12/30/2007  . HYPERTENSION 12/30/2007  . INSOMNIA 12/30/2007  . HYPERSOMNIA 12/30/2007  . DM w/o Complication Type II [id number]  . HYPERCHOLESTEROLEMIA 12/16/2007  . EXOGENOUS OBESITY 12/16/2007  . SLEEP APNEA, OBSTRUCTIVE 12/16/2007  . ALLERGIC RHINITIS 12/16/2007    has No Known Allergies.  MEDICAL HISTORY: Past Medical History:  Diagnosis Date  . Allergic rhinitis, cause unspecified   . Arthritis   . Depression   . Diverticulosis   . History of Bell's palsy   . History of diabetes mellitus   . Hypersomnia   . OSA on CPAP    not using at this time 06/15/19  . Other and unspecified hyperlipidemia   . Personal history of malignant neoplasm of ovary   . Type II or unspecified type diabetes mellitus without mention of complication, not stated as uncontrolled   . Unspecified essential hypertension   . Vertigo     SURGICAL HISTORY: Past Surgical History:  Procedure Laterality Date  . ABDOMINAL ADHESION SURGERY  12/96   exc peritoneal cyst  . BREAST BIOPSY Left 2000   stereotactic, negative  . BREAST LUMPECTOMY WITH RADIOACTIVE SEED AND SENTINEL LYMPH NODE BIOPSY Right 05/30/2019   Procedure: RIGHT BREAST LUMPECTOMY WITH BRACKETED RADIOACTIVE SEEDS  AND SENTINEL LYMPH NODE BIOPSY;  Surgeon: Stark Klein, MD;  Location: [facility];  Service: General;  Laterality: Right;  . BREAST REDUCTION SURGERY Bilateral 10/03  . EYE SURGERY Left    cataracts  . LAPAROSCOPIC SALPINGO OOPHERECTOMY Right 12/96  . 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 LAPAROSCOPY ABDOMEN DIAGNOSTIC  2/08   w/LOA, LSO  . PORTACATH PLACEMENT Left 06/16/2019   Procedure: INSERTION PORT-A-CATH;  Surgeon: Stark Klein, MD;  Location: [facility];  Service: General;  Laterality: Left;  . REDUCTION MAMMAPLASTY Bilateral 2004  . TONSILLECTOMY    . TOTAL ABDOMINAL  HYSTERECTOMY W/ BILATERAL SALPINGOOPHORECTOMY  5/94   secondary to uterine fibroids  . TOTAL KNEE ARTHROPLASTY Right 05/12/2013   Procedure: TOTAL KNEE ARTHROPLASTY;  Surgeon: Alta Corning, MD;  Location: [facility];  Service: Orthopedics;  Laterality: Right;  . TRIGGER FINGER RELEASE  07/06/2012   Procedure: RELEASE TRIGGER FINGER/A-1 PULLEY;  Surgeon: Alta Corning, MD;  Location: [facility];  Service: Orthopedics;  Laterality: Left;  left ring finger  . TUBAL LIGATION      SOCIAL HISTORY: Social History   Socioeconomic History  . Marital status: Married    Spouse name: Not on file  . Number of children: 3  . Years of education: Not on file  . Highest education level: Not on file  Occupational History  . Occupation: Fish farm manager REP    Employer: [employer]  Tobacco Use  . Smoking status: Never Smoker  . Smokeless tobacco: Never Used  Substance and Sexual Activity  . Alcohol use: Yes    Alcohol/week: 1.0 standard drinks    Types: 1 Glasses of wine per week    Comment: occasionally  . Drug use: No  . Sexual activity: Not Currently    Partners: Male    Birth control/protection: Abstinence  Other Topics Concern  . Not on file  Social History Narrative  . Not on file   Social Determinants of Health   Financial Resource Strain:   . Difficulty of Paying Living Expenses:   Food Insecurity:   . Worried About Charity fundraiser in the Last Year:   . Arboriculturist in the Last Year:   Transportation Needs:   . Film/video editor (Medical):   Marland Kitchen Lack of Transportation (Non-Medical):   Physical Activity:   . Days of Exercise per Week:   . Minutes of Exercise per Session:   Stress:   . Feeling of Stress :   Social Connections:   . Frequency of Communication with Friends and Family:   . Frequency of Social Gatherings with Friends and Family:   . Attends Religious Services:   . Active Member of Clubs or Organizations:   . Attends Archivist  Meetings:   Marland Kitchen Marital Status:   Intimate Partner Violence:   . Fear of Current or Ex-Partner:   . Emotionally Abused:   Marland Kitchen Physically Abused:   . Sexually Abused:     FAMILY HISTORY: Family History  Problem Relation Age of Onset  . Lung cancer Mother   . Colon cancer Father   . Stroke Brother   . Stroke Sister   . Pulmonary Hypertension Sister     Review of Systems  Constitutional: Negative for appetite change, chills, fatigue, fever and unexpected weight change.  HENT:   Negative for hearing loss and lump/mass.   Eyes: Negative for eye problems and icterus.  Respiratory: Negative for chest tightness, cough and shortness of breath.   Cardiovascular: Negative for chest pain, leg swelling and palpitations.  Gastrointestinal: Negative for abdominal distention, abdominal pain, constipation, diarrhea, nausea and vomiting.  Endocrine: Positive for hot flashes.  Genitourinary: Negative for difficulty urinating.   Skin: Negative for itching and rash.  Neurological: Negative for dizziness, extremity weakness, headaches and numbness.  Hematological: Negative for adenopathy. Does not bruise/bleed easily.  Psychiatric/Behavioral: Negative for depression. The patient is not nervous/anxious.       PHYSICAL EXAMINATION  ECOG PERFORMANCE STATUS: 1 - Symptomatic but completely ambulatory  Vitals:   01/02/20 1157  BP: (!) 162/60  Pulse: (!) 50  Resp: 18  Temp: 97.8 F (36.6 C)  SpO2: 100%    Physical Exam Constitutional:      General: She is not in acute distress.    Appearance: Normal appearance. She is not toxic-appearing.  HENT:     Head: Normocephalic and atraumatic.  Eyes:     General: No scleral icterus.    Pupils: Pupils are equal, round, and reactive to light.  Cardiovascular:     Rate and Rhythm: Normal rate and regular rhythm.     Pulses: Normal pulses.     Heart sounds: Normal heart sounds.  Pulmonary:     Effort: Pulmonary effort is normal.     Breath sounds:  Normal breath sounds.  Abdominal:     General: Abdomen is flat. Bowel sounds are normal.     Palpations: Abdomen is soft.  Musculoskeletal:     Cervical back: Neck supple.  Lymphadenopathy:     Cervical: No cervical adenopathy.  Skin:    General: Skin is warm and dry.     Capillary Refill: Capillary refill takes less than 2 seconds.     Findings: No rash.  Neurological:     Mental Status: She is alert.  Psychiatric:  Mood and Affect: Mood normal.        Behavior: Behavior normal.     LABORATORY DATA:  CBC    Component Value Date/Time   WBC 6.4 01/02/2020 1115   WBC 6.5 05/25/2019 0824   RBC 4.10 01/02/2020 1115   HGB 11.8 (L) 01/02/2020 1115   HGB 13.1 06/24/2017 1511   HCT 36.8 01/02/2020 1115   HCT 39.1 06/24/2017 1511   PLT 186 01/02/2020 1115   PLT 205 06/24/2017 1511   MCV 89.8 01/02/2020 1115   MCV 87 06/24/2017 1511   MCH 28.8 01/02/2020 1115   MCHC 32.1 01/02/2020 1115   RDW 13.7 01/02/2020 1115   RDW 14.7 06/24/2017 1511   LYMPHSABS 1.6 01/02/2020 1115   LYMPHSABS 3.0 06/24/2017 1511   MONOABS 0.7 01/02/2020 1115   EOSABS 0.2 01/02/2020 1115   EOSABS 0.2 06/24/2017 1511   BASOSABS 0.0 01/02/2020 1115   BASOSABS 0.0 06/24/2017 1511    CMP     Component Value Date/Time   NA 138 01/02/2020 1115   K 4.2 01/02/2020 1115   CL 103 01/02/2020 1115   CO2 28 01/02/2020 1115   GLUCOSE 110 (H) 01/02/2020 1115   BUN 25 (H) 01/02/2020 1115   CREATININE 0.97 01/02/2020 1115   CALCIUM 9.7 01/02/2020 1115   PROT 7.1 01/02/2020 1115   ALBUMIN 3.9 01/02/2020 1115   AST 22 01/02/2020 1115   ALT 29 01/02/2020 1115   ALKPHOS 117 01/02/2020 1115   BILITOT 0.9 01/02/2020 1115   GFRNONAA >60 01/02/2020 1115   GFRAA >60 01/02/2020 1115        ASSESSMENT and THERAPY PLAN:   Malignant neoplasm of upper-inner quadrant of right breast in female, estrogen receptor positive ([place]) 04/19/2019:Screening mammogram detected right breast asymmetry. Diagnostic  mammogram and US showed 2 indeterminate right breast masses, 22m at 1 o'clock, 777mat 12 o'clock, with no axillary adenopathy. 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 Stage Ia  05/30/2019: Right lumpectomy:Right lumpectomy ([facility] IDC with DCIS, grade 3, 0.6cm, clear margins, 3 axillary lymph nodes negative.HER-2 positive (3+), ER+ (80%), PR -, Ki67 40%. Stage Ia  Treatment plan: 1.Adjuvant chemo with Taxol Herceptin x9 cycles(stopped for neuropathy) 06/27/2019-08/22/2019 2. Adjuvant radiation therapy 09/26/2019- 10/27/19 3. Adjuvant antiestrogen therapy starting 11/21/2019 ------------------------------------------------------------------------------------------------------------------------------------------------------- Current treatment: Herceptin maintenance therapy  12/21/2019: Echocardiogram EF 60 to 65%  Chemo toxicities: 1.Chemotherapy-induced anemia: Monitoring closely 2.Herceptin-induced diarrhea: Stable on Imodium 3.Chemo-induced peripheral neuropathy: grade 1, improving  Kathy Reese is doing well.  Her labs are stable and she is tolerating the anastrozole well. She will continue taking Anastrozole daily and receiving Herceptin every 3 weeks.  I placed orders for her mammogram and bone density to be completed in July of this year.  I also requested her f/u appointments, along with SCP visit.  She understands her upcoming appointments and schedule.      Orders Placed This Encounter  Procedures  . MM DIAG BREAST TOMO BILATERAL    Standing Status:   Future    Standing Expiration Date:   03/03/2021    Scheduling Instructions:     Schedule with bone density    Order Specific Question:   Reason for Exam (SYMPTOM  OR DIAGNOSIS REQUIRED)    Answer:   breast cancer    Order Specific Question:   [facility]?    [facility] Bone Density    Standing Status:   Future  Standing Expiration Date:    03/03/2021    Scheduling Instructions:     Schedule with mammogram    Order Specific Question:   Reason for Exam (SYMPTOM  OR DIAGNOSIS REQUIRED)    Answer:   estrogen deficiency    Order Specific Question:   Preferred imaging location?    Answer:   [facility]    All questions were answered. The patient knows to call the clinic with any problems, questions or concerns. We can certainly see the patient much sooner if necessary.  Total encounter time: 30 minutes*  Wilber Bihari, NP 01/02/20 4:58 PM Medical Oncology and Hematology [facility] [place], [place] [address] Tel. [phone]    Fax. [phone]  *Total Encounter Time as defined by the Centers for Medicare and Medicaid Services includes, in addition to the face-to-face time of a patient visit (documented in the note above) non-face-to-face time: obtaining and reviewing outside history, ordering and reviewing medications, tests or procedures, care coordination (communications with other health care professionals or caregivers) and documentation in the medical record.

## 2020-01-02 ENCOUNTER — Encounter: Payer: Self-pay | Admitting: Adult Health

## 2020-01-02 ENCOUNTER — Inpatient Hospital Stay: Payer: Medicare Other

## 2020-01-02 ENCOUNTER — Inpatient Hospital Stay: Payer: Medicare Other | Admitting: Adult Health

## 2020-01-02 ENCOUNTER — Telehealth: Payer: Self-pay | Admitting: Adult Health

## 2020-01-02 ENCOUNTER — Other Ambulatory Visit: Payer: Self-pay

## 2020-01-02 VITALS — BP 162/60 | HR 50 | Temp 97.8°F | Resp 18 | Ht 63.0 in | Wt 203.1 lb

## 2020-01-02 DIAGNOSIS — Z170 Estrogen receptor positive status [ER+]: Secondary | ICD-10-CM

## 2020-01-02 DIAGNOSIS — C50211 Malignant neoplasm of upper-inner quadrant of right female breast: Secondary | ICD-10-CM

## 2020-01-02 DIAGNOSIS — E2839 Other primary ovarian failure: Secondary | ICD-10-CM

## 2020-01-02 DIAGNOSIS — Z95828 Presence of other vascular implants and grafts: Secondary | ICD-10-CM

## 2020-01-02 DIAGNOSIS — Z5112 Encounter for antineoplastic immunotherapy: Secondary | ICD-10-CM | POA: Diagnosis not present

## 2020-01-02 LAB — CMP (CANCER CENTER ONLY)
ALT: 29 U/L (ref 0–44)
AST: 22 U/L (ref 15–41)
Albumin: 3.9 g/dL (ref 3.5–5.0)
Alkaline Phosphatase: 117 U/L (ref 38–126)
Anion gap: 7 (ref 5–15)
BUN: 25 mg/dL — ABNORMAL HIGH (ref 8–23)
CO2: 28 mmol/L (ref 22–32)
Calcium: 9.7 mg/dL (ref 8.9–10.3)
Chloride: 103 mmol/L (ref 98–111)
Creatinine: 0.97 mg/dL (ref 0.44–1.00)
GFR, Est AFR Am: >60 mL/min (ref >60)
GFR, Estimated: >60 mL/min (ref >60)
Glucose, Bld: 110 mg/dL — ABNORMAL HIGH (ref 70–99)
Potassium: 4.2 mmol/L (ref 3.5–5.1)
Sodium: 138 mmol/L (ref 135–145)
Total Bilirubin: 0.9 mg/dL (ref 0.3–1.2)
Total Protein: 7.1 g/dL (ref 6.5–8.1)

## 2020-01-02 LAB — CBC WITH DIFFERENTIAL (CANCER CENTER ONLY)
Abs Immature Granulocytes: 0.01 10*3/uL (ref 0.00–0.07)
Basophils Absolute: 0 10*3/uL (ref 0.0–0.1)
Basophils Relative: 1 %
Eosinophils Absolute: 0.2 10*3/uL (ref 0.0–0.5)
Eosinophils Relative: 3 %
HCT: 36.8 % (ref 36.0–46.0)
Hemoglobin: 11.8 g/dL — ABNORMAL LOW (ref 12.0–15.0)
Immature Granulocytes: 0 %
Lymphocytes Relative: 25 %
Lymphs Abs: 1.6 10*3/uL (ref 0.7–4.0)
MCH: 28.8 pg (ref 26.0–34.0)
MCHC: 32.1 g/dL (ref 30.0–36.0)
MCV: 89.8 fL (ref 80.0–100.0)
Monocytes Absolute: 0.7 10*3/uL (ref 0.1–1.0)
Monocytes Relative: 11 %
Neutro Abs: 3.8 10*3/uL (ref 1.7–7.7)
Neutrophils Relative %: 60 %
Platelet Count: 186 10*3/uL (ref 150–400)
RBC: 4.1 MIL/uL (ref 3.87–5.11)
RDW: 13.7 % (ref 11.5–15.5)
WBC Count: 6.4 10*3/uL (ref 4.0–10.5)
nRBC: 0 % (ref 0.0–0.2)

## 2020-01-02 MED ORDER — ACETAMINOPHEN 325 MG PO TABS
650.0000 mg | ORAL_TABLET | Freq: Once | ORAL | Status: AC
  Administered 2020-01-02: 650 mg via ORAL

## 2020-01-02 MED ORDER — HEPARIN SOD (PORK) LOCK FLUSH 100 UNIT/ML IV SOLN
500.0000 [IU] | Freq: Once | INTRAVENOUS | Status: AC | PRN
  Administered 2020-01-02: 500 [IU]
  Filled 2020-01-02: qty 5

## 2020-01-02 MED ORDER — SODIUM CHLORIDE 0.9 % IV SOLN
Freq: Once | INTRAVENOUS | Status: AC
  Filled 2020-01-02: qty 250

## 2020-01-02 MED ORDER — TRASTUZUMAB-ANNS CHEMO 150 MG IV SOLR
600.0000 mg | Freq: Once | INTRAVENOUS | Status: AC
  Administered 2020-01-02: 600 mg via INTRAVENOUS
  Filled 2020-01-02: qty 28.57

## 2020-01-02 MED ORDER — SODIUM CHLORIDE 0.9% FLUSH
10.0000 mL | Freq: Once | INTRAVENOUS | Status: AC
  Administered 2020-01-02: 10 mL
  Filled 2020-01-02: qty 10

## 2020-01-02 MED ORDER — DIPHENHYDRAMINE HCL 25 MG PO CAPS
50.0000 mg | ORAL_CAPSULE | Freq: Once | ORAL | Status: AC
  Administered 2020-01-02: 50 mg via ORAL

## 2020-01-02 MED ORDER — SODIUM CHLORIDE 0.9% FLUSH
10.0000 mL | INTRAVENOUS | Status: DC | PRN
  Administered 2020-01-02: 10 mL
  Filled 2020-01-02: qty 10

## 2020-01-02 MED ORDER — ACETAMINOPHEN 325 MG PO TABS
ORAL_TABLET | ORAL | Status: AC
  Filled 2020-01-02: qty 2

## 2020-01-02 MED ORDER — DIPHENHYDRAMINE HCL 25 MG PO CAPS
ORAL_CAPSULE | ORAL | Status: AC
  Filled 2020-01-02: qty 1

## 2020-01-02 NOTE — Patient Instructions (Signed)
[facility] Discharge Instructions for Patients Receiving Chemotherapy  Today you received the following chemotherapy agents Trastuzumab (KANJINTI).  To help prevent nausea and vomiting after your treatment, we encourage you to take your nausea medication as prescribed.   If you develop nausea and vomiting that is not controlled by your nausea medication, call the clinic.   BELOW ARE SYMPTOMS THAT SHOULD BE REPORTED IMMEDIATELY:  *FEVER GREATER THAN 100.5 F  *CHILLS WITH OR WITHOUT FEVER  NAUSEA AND VOMITING THAT IS NOT CONTROLLED WITH YOUR NAUSEA MEDICATION  *UNUSUAL SHORTNESS OF BREATH  *UNUSUAL BRUISING OR BLEEDING  TENDERNESS IN MOUTH AND THROAT WITH OR WITHOUT PRESENCE OF ULCERS  *URINARY PROBLEMS  *BOWEL PROBLEMS  UNUSUAL RASH Items with * indicate a potential emergency and should be followed up as soon as possible.  Feel free to call the clinic should you have any questions or concerns. The clinic phone number is [phone].  Please show the CHEMO ALERT CARD at check-in to the Emergency Department and triage nurse.   

## 2020-01-02 NOTE — Patient Instructions (Signed)

## 2020-01-02 NOTE — Telephone Encounter (Signed)
Scheduled appts per 3/30 los. Pt confirmed new appt date and time.

## 2020-01-03 NOTE — Telephone Encounter (Signed)
error 

## 2020-01-17 NOTE — Progress Notes (Signed)
Pharmacist Chemotherapy Monitoring - Follow Up Assessment    I verify that I have reviewed each item in the below checklist:  . Regimen for the patient is scheduled for the appropriate day and plan matches scheduled date. Marland Kitchen Appropriate non-routine labs are ordered dependent on drug ordered. . If applicable, additional medications reviewed and ordered per protocol based on lifetime cumulative doses and/or treatment regimen.   Plan for follow-up and/or issues identified: No . I-vent associated with next due treatment: No . MD and/or nursing notified: No    Kennith Center, Pharm.D., CPP 01/17/2020@4 :19 PM

## 2020-01-23 ENCOUNTER — Encounter: Payer: Self-pay | Admitting: Pharmacist

## 2020-01-23 ENCOUNTER — Encounter (INDEPENDENT_AMBULATORY_CARE_PROVIDER_SITE_OTHER): Payer: Self-pay

## 2020-01-23 ENCOUNTER — Inpatient Hospital Stay: Payer: Medicare Other | Attending: Hematology and Oncology

## 2020-01-23 ENCOUNTER — Other Ambulatory Visit: Payer: Self-pay

## 2020-01-23 VITALS — BP 140/54 | HR 58 | Temp 98.0°F | Resp 16 | Wt 196.0 lb

## 2020-01-23 DIAGNOSIS — Z5112 Encounter for antineoplastic immunotherapy: Secondary | ICD-10-CM | POA: Insufficient documentation

## 2020-01-23 DIAGNOSIS — C50211 Malignant neoplasm of upper-inner quadrant of right female breast: Secondary | ICD-10-CM | POA: Insufficient documentation

## 2020-01-23 DIAGNOSIS — Z170 Estrogen receptor positive status [ER+]: Secondary | ICD-10-CM | POA: Diagnosis not present

## 2020-01-23 MED ORDER — HEPARIN SOD (PORK) LOCK FLUSH 100 UNIT/ML IV SOLN
500.0000 [IU] | Freq: Once | INTRAVENOUS | Status: DC | PRN
  Filled 2020-01-23: qty 5

## 2020-01-23 MED ORDER — TRASTUZUMAB-ANNS CHEMO 150 MG IV SOLR
525.0000 mg | Freq: Once | INTRAVENOUS | Status: AC
  Administered 2020-01-23: 525 mg via INTRAVENOUS
  Filled 2020-01-23: qty 25

## 2020-01-23 MED ORDER — ACETAMINOPHEN 325 MG PO TABS
650.0000 mg | ORAL_TABLET | Freq: Once | ORAL | Status: AC
  Administered 2020-01-23: 650 mg via ORAL

## 2020-01-23 MED ORDER — SODIUM CHLORIDE 0.9 % IV SOLN
Freq: Once | INTRAVENOUS | Status: AC
  Filled 2020-01-23: qty 250

## 2020-01-23 MED ORDER — DIPHENHYDRAMINE HCL 25 MG PO CAPS
ORAL_CAPSULE | ORAL | Status: AC
  Filled 2020-01-23: qty 2

## 2020-01-23 MED ORDER — SODIUM CHLORIDE 0.9% FLUSH
10.0000 mL | INTRAVENOUS | Status: DC | PRN
  Filled 2020-01-23: qty 10

## 2020-01-23 MED ORDER — ACETAMINOPHEN 325 MG PO TABS
ORAL_TABLET | ORAL | Status: AC
  Filled 2020-01-23: qty 2

## 2020-01-23 MED ORDER — DIPHENHYDRAMINE HCL 25 MG PO CAPS
50.0000 mg | ORAL_CAPSULE | Freq: Once | ORAL | Status: AC
  Administered 2020-01-23: 50 mg via ORAL

## 2020-01-23 NOTE — Progress Notes (Signed)
Patient has lost weight. Reducing dose to reflect current weight per Dr. Lindi Adie.

## 2020-01-23 NOTE — Patient Instructions (Signed)
[facility] Discharge Instructions for Patients Receiving Chemotherapy  Today you received the following chemotherapy agents Trastuzumab (KANJINTI).  To help prevent nausea and vomiting after your treatment, we encourage you to take your nausea medication as prescribed.   If you develop nausea and vomiting that is not controlled by your nausea medication, call the clinic.   BELOW ARE SYMPTOMS THAT SHOULD BE REPORTED IMMEDIATELY:  *FEVER GREATER THAN 100.5 F  *CHILLS WITH OR WITHOUT FEVER  NAUSEA AND VOMITING THAT IS NOT CONTROLLED WITH YOUR NAUSEA MEDICATION  *UNUSUAL SHORTNESS OF BREATH  *UNUSUAL BRUISING OR BLEEDING  TENDERNESS IN MOUTH AND THROAT WITH OR WITHOUT PRESENCE OF ULCERS  *URINARY PROBLEMS  *BOWEL PROBLEMS  UNUSUAL RASH Items with * indicate a potential emergency and should be followed up as soon as possible.  Feel free to call the clinic should you have any questions or concerns. The clinic phone number is [phone].  Please show the CHEMO ALERT CARD at check-in to the Emergency Department and triage nurse.   

## 2020-01-30 ENCOUNTER — Encounter (INDEPENDENT_AMBULATORY_CARE_PROVIDER_SITE_OTHER): Payer: Self-pay

## 2020-02-06 ENCOUNTER — Encounter (INDEPENDENT_AMBULATORY_CARE_PROVIDER_SITE_OTHER): Payer: Self-pay

## 2020-02-07 NOTE — Progress Notes (Signed)
Pharmacist Chemotherapy Monitoring - Follow Up Assessment    I verify that I have reviewed each item in the below checklist:  . Regimen for the patient is scheduled for the appropriate day and plan matches scheduled date. Marland Kitchen Appropriate non-routine labs are ordered dependent on drug ordered. . If applicable, additional medications reviewed and ordered per protocol based on lifetime cumulative doses and/or treatment regimen.   Plan for follow-up and/or issues identified: No . I-vent associated with next due treatment: No . MD and/or nursing notified: No   Kennith Center, Pharm.D., CPP 02/07/2020@9 :45 AM

## 2020-02-12 NOTE — Progress Notes (Signed)
Patient Care Team: Nolene Ebbs, MD as PCP - General (Internal Medicine) Mauro Kaufmann, RN as Oncology Nurse Navigator Rockwell Germany, RN as Oncology Nurse Navigator  DIAGNOSIS:    ICD-10-CM   1. Malignant neoplasm of upper-inner quadrant of right breast in female, estrogen receptor positive ([place])  C50.211    Z17.0     SUMMARY OF ONCOLOGIC HISTORY: Oncology History  Malignant neoplasm of upper-inner quadrant of right breast in female, estrogen receptor positive ([place])  04/12/2019 Cancer Staging   Staging form: Breast, AJCC 8th Edition - Clinical stage from 04/12/2019: Stage IA (cT1b, cN0, cM0, G3, ER+, PR-, HER2+) - Signed by Gardenia Phlegm, NP on 04/19/2019   04/19/2019 Initial Diagnosis   Screening mammogram detected right breast asymmetry. Diagnostic mammogram and US showed 2 indeterminate right breast masses, 23m at 1 o'clock, 723mat 12 o'clock, with no axillary adenopathy. 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    05/30/2019 Surgery   Right lumpectomy ([facility] IDC with DCIS, grade 3, 0.6cm, clear margins, 3 axillary lymph nodes negative.    06/06/2019 Cancer Staging   Staging form: Breast, AJCC 8th Edition - Pathologic: Stage IA (pT1b, pN0, cM0, G3, ER+, PR-, HER2+) - Signed by GuNicholas LoseMD on 06/06/2019   06/27/2019 -  Chemotherapy   The patient had PACLitaxel (TAXOL) 174 mg in sodium chloride 0.9 % 250 mL chemo infusion (</= 8022m2), 80 mg/m2 = 174 mg, Intravenous,  Once, 3 of 3 cycles Dose modification: 65 mg/m2 (original dose 80 mg/m2, Cycle 3, Reason: Dose not tolerated) Administration: 174 mg (06/27/2019), 174 mg (07/04/2019), 174 mg (07/25/2019), 174 mg (07/11/2019), 174 mg (07/18/2019), 174 mg (08/01/2019), 174 mg (08/08/2019), 174 mg (08/15/2019), 138 mg (08/22/2019) trastuzumab-anns (KANJINTI) 399 mg in sodium chloride 0.9 % 250 mL chemo infusion, 4 mg/kg = 399 mg (100 % of original dose 4 mg/kg), Intravenous,  Once, 10  of 16 cycles Dose modification: 4 mg/kg (original dose 4 mg/kg, Cycle 1, Reason: Other (see comments), Comment: Biosimilar Conversion; [facility] (original dose 2 mg/kg, Cycle 3, Reason: Provider Judgment), 6 mg/kg (original dose 2 mg/kg, Cycle 4, Reason: Other (see comments), Comment: switch to maintenance q3 weeks), 600 mg (original dose 2 mg/kg, Cycle 4, Reason: Other (see comments), Comment: maint q3 weeks), 525 mg (original dose 2 mg/kg, Cycle 11, Reason: Other (see comments), Comment: Weight loss) Administration: 399 mg (06/27/2019), 189 mg (07/04/2019), 189 mg (07/11/2019), 189 mg (07/18/2019), 189 mg (07/25/2019), 189 mg (08/01/2019), 189 mg (08/08/2019), 189 mg (08/15/2019), 189 mg (08/22/2019), 600 mg (08/29/2019), 600 mg (09/19/2019), 600 mg (10/10/2019), 600 mg (10/31/2019), 600 mg (11/21/2019), 600 mg (12/12/2019), 600 mg (01/02/2020), 525 mg (01/23/2020)  for chemotherapy treatment.    09/26/2019 - 10/27/2019 Radiation Therapy   Adjuvant radiation therapy   11/21/2019 -  Anti-estrogen oral therapy   Anastrozole, plan 7 years     CHIEF COMPLIANT: Follow-up onHerceptin maintenance  INTERVAL HISTORY: Kathy Reese a 68 39o. with above-mentioned history of right breast cancerwho underwent a lumpectomy,adjuvant chemotherapy, radiation, and is currently on Herceptin maintenance and antiestrogen therapy with anastrozole. She presents to the clinic todayfortreatment.   ALLERGIES:  has No Known Allergies.  MEDICATIONS:  Current Outpatient Medications  Medication Sig Dispense Refill  . ACCU-CHEK AVIVA PLUS test strip     . Accu-Chek Softclix Lancets lancets     . anastrozole (ARIMIDEX) 1 MG tablet Take 1 tablet (1 mg total) by mouth  daily. 90 tablet 3  . aspirin EC 81 MG tablet Take 81 mg by mouth daily.    Marland Kitchen atorvastatin (LIPITOR) 40 MG tablet Take 40 mg by mouth daily at 6 PM.     . Calcium Carb-Cholecalciferol (CALCIUM 600+D3 PO) Take 1 tablet by mouth 2 (two) times a  day.    . cholecalciferol (VITAMIN D3) 25 MCG (1000 UT) tablet Take 1,000 Units by mouth daily.    . insulin detemir (LEVEMIR) 100 UNIT/ML injection Inject 12-15 Units into the skin See admin instructions. Inject 15 units subcutaneously in the morning & 12 units subcutaneously at night.    . lidocaine-prilocaine (EMLA) cream Apply to affected area once 30 g 3  . LORazepam (ATIVAN) 0.5 MG tablet TAKE 1 TABLET BY MOUTH AT BEDTIME AS NEEDED FOR SLEEP 30 tablet 0  . losartan-hydrochlorothiazide (HYZAAR) 100-25 MG tablet Take 1 tablet by mouth daily.  4  . metFORMIN (GLUCOPHAGE) 500 MG tablet Take 500 mg by mouth every morning.      No current facility-administered medications for this visit.    PHYSICAL EXAMINATION: ECOG PERFORMANCE STATUS: 1 - Symptomatic but completely ambulatory  Vitals:   02/13/20 0853  BP: (!) 178/72  Pulse: (!) 52  Resp: 20  Temp: 98.5 F (36.9 C)  SpO2: 100%   Filed Weights   02/13/20 0853  Weight: 208 lb 4.8 oz (94.5 kg)    LABORATORY DATA:  I have reviewed the data as listed CMP Latest Ref Rng & Units 01/02/2020 11/21/2019 10/10/2019  Glucose 70 - 99 mg/dL 110(H) 130(H) 158(H)  BUN 8 - 23 mg/dL 25(H) 25(H) 24(H)  Creatinine 0.44 - 1.00 mg/dL 0.97 0.83 0.83  Sodium 135 - 145 mmol/L 138 142 137  Potassium 3.5 - 5.1 mmol/L 4.2 3.9 4.0  Chloride 98 - 111 mmol/L 103 107 102  CO2 22 - 32 mmol/L _0 Calcium 8.9 - 10.3 mg/dL 9.7 9.1 9.4  Total Protein 6.5 - 8.1 g/dL 7.1 6.8 7.0  Total Bilirubin 0.3 - 1.2 mg/dL 0.9 0.7 1.1  Alkaline Phos 38 - 126 U/L 117 108 99  AST 15 - 41 U/L _1 ALT 0 - 44 U/L _2 Lab Results  Component Value Date   WBC 6.4 02/13/2020   HGB 10.4 (L) 02/13/2020   HCT 32.6 (L) 02/13/2020   MCV 89.8 02/13/2020   PLT 160 02/13/2020   NEUTROABS 4.1 02/13/2020    ASSESSMENT & PLAN:  Malignant neoplasm of upper-inner quadrant of right breast in female, estrogen receptor positive ([place]) 04/19/2019:Screening mammogram  detected right breast asymmetry. Diagnostic mammogram and US showed 2 indeterminate right breast masses, 55m at 1 o'clock, 736mat 12 o'clock, with no axillary adenopathy. 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 Stage Ia  05/30/2019: [facility] with DC[facility], grade 3, 0.6cm, clear margins, 3 axillary lymph nodes negative.HER-2 positive (3+), ER+ (80%), PR -, Ki67 40%. Stage Ia  Treatment plan: 1.Adjuvant chemo with Taxol Herceptinx9 cycles(stopped for neuropathy)06/27/2019-08/22/2019 2. Adjuvant radiation therapy12/22/2020- 10/27/19 3. Adjuvant antiestrogen therapy with anastrozole started 11/21/2019 ------------------------------------------------------------------------------------------------------------------------------------------------------- Current treatment:Herceptin maintenance therapy  with anastrozole 12/21/2019: Echocardiogram EF 60 to 65%  Herceptin toxicities:Herceptin-induced diarrhea: Has not required Imodium. Poor appetite: Patient has not fully recovered her appetite since chemotherapy  Chemo-induced peripheral neuropathy: grade 1, improving Chemotherapy-induced anemia: Today we are seeing a slight dip in her hemoglobin to 10.4.  We are watching and  observing this.  Breast cancer surveillance: Mammogram and bone density have been scheduled for 03/29/2020. I will order echocardiogram to be done in June. Return to clinic every 3 weeks for Herceptin every 6 weeks to follow-up with me.   No orders of the defined types were placed in this encounter.  The patient has a good understanding of the overall plan. she agrees with it. she will call with any problems that may develop before the next visit here.  Total time spent: 30 mins including face to face time and time spent for planning, charting and coordination of care  Nicholas Lose, MD 02/13/2020  I, Cloyde Reams Dorshimer, am acting as scribe  for Dr. Nicholas Lose.  I have reviewed the above documentation for accuracy and completeness, and I agree with the above.

## 2020-02-13 ENCOUNTER — Inpatient Hospital Stay: Payer: Medicare Other

## 2020-02-13 ENCOUNTER — Inpatient Hospital Stay (HOSPITAL_BASED_OUTPATIENT_CLINIC_OR_DEPARTMENT_OTHER): Payer: Medicare Other | Admitting: Hematology and Oncology

## 2020-02-13 ENCOUNTER — Encounter (INDEPENDENT_AMBULATORY_CARE_PROVIDER_SITE_OTHER): Payer: Self-pay

## 2020-02-13 ENCOUNTER — Encounter: Payer: Self-pay | Admitting: *Deleted

## 2020-02-13 ENCOUNTER — Other Ambulatory Visit: Payer: Self-pay

## 2020-02-13 ENCOUNTER — Inpatient Hospital Stay: Payer: Medicare Other | Attending: Hematology and Oncology

## 2020-02-13 DIAGNOSIS — Z170 Estrogen receptor positive status [ER+]: Secondary | ICD-10-CM

## 2020-02-13 DIAGNOSIS — C50211 Malignant neoplasm of upper-inner quadrant of right female breast: Secondary | ICD-10-CM | POA: Insufficient documentation

## 2020-02-13 DIAGNOSIS — Z95828 Presence of other vascular implants and grafts: Secondary | ICD-10-CM

## 2020-02-13 DIAGNOSIS — Z5112 Encounter for antineoplastic immunotherapy: Secondary | ICD-10-CM | POA: Diagnosis not present

## 2020-02-13 LAB — CMP (CANCER CENTER ONLY)
ALT: 25 U/L (ref 0–44)
AST: 20 U/L (ref 15–41)
Albumin: 3.7 g/dL (ref 3.5–5.0)
Alkaline Phosphatase: 103 U/L (ref 38–126)
Anion gap: 8 (ref 5–15)
BUN: 21 mg/dL (ref 8–23)
CO2: 28 mmol/L (ref 22–32)
Calcium: 9.5 mg/dL (ref 8.9–10.3)
Chloride: 106 mmol/L (ref 98–111)
Creatinine: 0.8 mg/dL (ref 0.44–1.00)
GFR, Est AFR Am: >60 mL/min (ref >60)
GFR, Estimated: >60 mL/min (ref >60)
Glucose, Bld: 110 mg/dL — ABNORMAL HIGH (ref 70–99)
Potassium: 4 mmol/L (ref 3.5–5.1)
Sodium: 142 mmol/L (ref 135–145)
Total Bilirubin: 1.2 mg/dL (ref 0.3–1.2)
Total Protein: 6.8 g/dL (ref 6.5–8.1)

## 2020-02-13 LAB — CBC WITH DIFFERENTIAL (CANCER CENTER ONLY)
Abs Immature Granulocytes: 0.02 10*3/uL (ref 0.00–0.07)
Basophils Absolute: 0 10*3/uL (ref 0.0–0.1)
Basophils Relative: 1 %
Eosinophils Absolute: 0.2 10*3/uL (ref 0.0–0.5)
Eosinophils Relative: 3 %
HCT: 32.6 % — ABNORMAL LOW (ref 36.0–46.0)
Hemoglobin: 10.4 g/dL — ABNORMAL LOW (ref 12.0–15.0)
Immature Granulocytes: 0 %
Lymphocytes Relative: 21 %
Lymphs Abs: 1.4 10*3/uL (ref 0.7–4.0)
MCH: 28.7 pg (ref 26.0–34.0)
MCHC: 31.9 g/dL (ref 30.0–36.0)
MCV: 89.8 fL (ref 80.0–100.0)
Monocytes Absolute: 0.7 10*3/uL (ref 0.1–1.0)
Monocytes Relative: 11 %
Neutro Abs: 4.1 10*3/uL (ref 1.7–7.7)
Neutrophils Relative %: 64 %
Platelet Count: 160 10*3/uL (ref 150–400)
RBC: 3.63 MIL/uL — ABNORMAL LOW (ref 3.87–5.11)
RDW: 14.3 % (ref 11.5–15.5)
WBC Count: 6.4 10*3/uL (ref 4.0–10.5)
nRBC: 0 % (ref 0.0–0.2)

## 2020-02-13 MED ORDER — HEPARIN SOD (PORK) LOCK FLUSH 100 UNIT/ML IV SOLN
500.0000 [IU] | Freq: Once | INTRAVENOUS | Status: AC | PRN
  Administered 2020-02-13: 500 [IU]
  Filled 2020-02-13: qty 5

## 2020-02-13 MED ORDER — SODIUM CHLORIDE 0.9% FLUSH
10.0000 mL | Freq: Once | INTRAVENOUS | Status: AC
  Administered 2020-02-13: 10 mL
  Filled 2020-02-13: qty 10

## 2020-02-13 MED ORDER — TRASTUZUMAB-ANNS CHEMO 150 MG IV SOLR
525.0000 mg | Freq: Once | INTRAVENOUS | Status: AC
  Administered 2020-02-13: 525 mg via INTRAVENOUS
  Filled 2020-02-13: qty 25

## 2020-02-13 MED ORDER — ACETAMINOPHEN 325 MG PO TABS
ORAL_TABLET | ORAL | Status: AC
  Filled 2020-02-13: qty 2

## 2020-02-13 MED ORDER — ACETAMINOPHEN 325 MG PO TABS
650.0000 mg | ORAL_TABLET | Freq: Once | ORAL | Status: AC
  Administered 2020-02-13: 650 mg via ORAL

## 2020-02-13 MED ORDER — DIPHENHYDRAMINE HCL 25 MG PO CAPS
ORAL_CAPSULE | ORAL | Status: AC
  Filled 2020-02-13: qty 2

## 2020-02-13 MED ORDER — SODIUM CHLORIDE 0.9 % IV SOLN
Freq: Once | INTRAVENOUS | Status: AC
  Filled 2020-02-13: qty 250

## 2020-02-13 MED ORDER — SODIUM CHLORIDE 0.9% FLUSH
10.0000 mL | INTRAVENOUS | Status: DC | PRN
  Administered 2020-02-13: 10 mL
  Filled 2020-02-13: qty 10

## 2020-02-13 MED ORDER — DIPHENHYDRAMINE HCL 25 MG PO CAPS
50.0000 mg | ORAL_CAPSULE | Freq: Once | ORAL | Status: AC
  Administered 2020-02-13: 50 mg via ORAL

## 2020-02-13 NOTE — Assessment & Plan Note (Signed)
04/19/2019:Screening mammogram detected right breast asymmetry. Diagnostic mammogram and US showed 2 indeterminate right breast masses, 70m at 1 o'clock, 726mat 12 o'clock, with no axillary adenopathy. 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 Stage Ia  05/30/2019: [facility], grade 3, 0.6cm, clear margins, 3 axillary lymph nodes negative.HER-2 positive (3+), ER+ (80%), PR -, Ki67 40%. Stage Ia  Treatment plan: 1.Adjuvant chemo with Taxol Herceptinx9 cycles(stopped for neuropathy)06/27/2019-08/22/2019 2. Adjuvant radiation therapy12/22/2020- 10/27/19 3. Adjuvant antiestrogen therapy with anastrozole started 11/21/2019 ------------------------------------------------------------------------------------------------------------------------------------------------------- Current treatment:Herceptin maintenance therapy  with anastrozole 12/21/2019: Echocardiogram EF 60 to 65%  Herceptin toxicities:Herceptin-induced diarrhea: Stable on Imodium  Chemo-induced peripheral neuropathy: grade 1, improving Chemotherapy-induced anemia:  Breast cancer surveillance: Mammogram and bone density have been scheduled for 03/29/2020. Return to clinic every 3 weeks for Herceptin every 6 weeks to follow-up with me.

## 2020-02-13 NOTE — Patient Instructions (Signed)
[facility] Discharge Instructions for Patients Receiving Chemotherapy  Today you received the following chemotherapy agents: Kanjinti (trastuzumab-anns).  To help prevent nausea and vomiting after your treatment, we encourage you to take your nausea medication as directed.   If you develop nausea and vomiting that is not controlled by your nausea medication, call the clinic.   BELOW ARE SYMPTOMS THAT SHOULD BE REPORTED IMMEDIATELY:  *FEVER GREATER THAN 100.5 F  *CHILLS WITH OR WITHOUT FEVER  NAUSEA AND VOMITING THAT IS NOT CONTROLLED WITH YOUR NAUSEA MEDICATION  *UNUSUAL SHORTNESS OF BREATH  *UNUSUAL BRUISING OR BLEEDING  TENDERNESS IN MOUTH AND THROAT WITH OR WITHOUT PRESENCE OF ULCERS  *URINARY PROBLEMS  *BOWEL PROBLEMS  UNUSUAL RASH Items with * indicate a potential emergency and should be followed up as soon as possible.  Feel free to call the clinic should you have any questions or concerns. The clinic phone number is [phone].  Please show the [place] at check-in to the Emergency Department and triage nurse.

## 2020-02-13 NOTE — Addendum Note (Signed)
Addended by: Geanie Berlin, Kenyetta Fife A on: 02/13/2020 08:45 AM   Modules accepted: Orders

## 2020-02-18 ENCOUNTER — Encounter (INDEPENDENT_AMBULATORY_CARE_PROVIDER_SITE_OTHER): Payer: Self-pay

## 2020-02-27 NOTE — Progress Notes (Signed)
Pharmacist Chemotherapy Monitoring - Follow Up Assessment    I verify that I have reviewed each item in the below checklist:   Regimen for the patient is scheduled for the appropriate day and plan matches scheduled date.  Appropriate non-routine labs are ordered dependent on drug ordered.  If applicable, additional medications reviewed and ordered per protocol based on lifetime cumulative doses and/or treatment regimen.   Plan for follow-up and/or issues identified: No  I-vent associated with next due treatment: No  MD and/or nursing notified: No  Kathy Reese K 02/27/2020 2:30 PM

## 2020-02-29 ENCOUNTER — Other Ambulatory Visit: Payer: Self-pay

## 2020-02-29 ENCOUNTER — Encounter: Payer: Self-pay | Admitting: Radiation Oncology

## 2020-02-29 ENCOUNTER — Ambulatory Visit
Admission: RE | Admit: 2020-02-29 | Discharge: 2020-02-29 | Disposition: A | Discharge status: Home / Self Care | Payer: Medicare Other | Source: Ambulatory Visit | Attending: Radiation Oncology | Admitting: Radiation Oncology

## 2020-02-29 VITALS — BP 145/51 | HR 58 | Temp 97.9°F | Resp 20 | Wt 205.8 lb

## 2020-02-29 DIAGNOSIS — C50211 Malignant neoplasm of upper-inner quadrant of right female breast: Secondary | ICD-10-CM | POA: Insufficient documentation

## 2020-02-29 DIAGNOSIS — Z923 Personal history of irradiation: Secondary | ICD-10-CM | POA: Diagnosis not present

## 2020-02-29 DIAGNOSIS — Z79899 Other long term (current) drug therapy: Secondary | ICD-10-CM | POA: Insufficient documentation

## 2020-02-29 DIAGNOSIS — Z7982 Long term (current) use of aspirin: Secondary | ICD-10-CM | POA: Insufficient documentation

## 2020-02-29 DIAGNOSIS — Z79811 Long term (current) use of aromatase inhibitors: Secondary | ICD-10-CM | POA: Insufficient documentation

## 2020-02-29 DIAGNOSIS — Z170 Estrogen receptor positive status [ER+]: Secondary | ICD-10-CM | POA: Insufficient documentation

## 2020-02-29 NOTE — Progress Notes (Addendum)
Radiation Oncology         [phone] ________________________________  Name: Kathy Reese MRN: [id number]  Date: 02/29/2020  DOB: 1951-09-03  Follow-Up Visit Note  CC: Nolene Ebbs, MD  Nolene Ebbs, MD    ICD-10-CM   1. Malignant neoplasm of upper-inner quadrant of right breast in female, estrogen receptor positive ([place])  C50.211 Ambulatory referral to Physical Therapy   Z17.0     Diagnosis:  StageIA, (pT1b,pN0, cM0) RightBreast UIQ,Invasive DuctalCarcinomawith DCIS, ER+/ PR-/ Her2+, Grade3  Interval Since Last Radiation: Four months and five days.  09/25/2019 through 10/27/2019 Site Technique Total Dose (Gy) Dose per Fx (Gy) Completed Fx Beam Energies  Breast, Right: Breast_Rt 3D 40.05/40.05 2.67 15/15 6X, 10X, 15X  Breast, Right: Breast_Rt_Bst specialPort 10/10 2 5/5 15E    Narrative:  The patient returns today for routine follow-up. Since her last visit, she underwent an echocardiogram on 12/21/2019 that showed an EF of 60-65%.  The patient was last seen by Dr. Lindi Adie on 02/13/2020. She continues Herceptin maintenance therapy with Anastrozole.  On review of systems, she reports occasional discomfort in the right breast depending on body position.  She denies any problems with swelling in her right arm or hand but occasionally will use her lymphedema sleeve if her arm aches.  She denies any nipple discharge or bleeding.  She does notice persistent swelling in the breast. She denies new bony pain headaches dizziness or blurred vision.  ALLERGIES:  has No Known Allergies.  Meds: Current Outpatient Medications  Medication Sig Dispense Refill  . ACCU-CHEK AVIVA PLUS test strip     . Accu-Chek Softclix Lancets lancets     . anastrozole (ARIMIDEX) 1 MG tablet Take 1 tablet (1 mg total) by mouth daily. 90 tablet 3  . aspirin EC 81 MG tablet Take 81 mg by mouth daily.    Marland Kitchen atorvastatin (LIPITOR) 40 MG tablet Take 40 mg by mouth daily at 6 PM.     . Calcium  Carb-Cholecalciferol (CALCIUM 600+D3 PO) Take 1 tablet by mouth 2 (two) times a day.    . cholecalciferol (VITAMIN D3) 25 MCG (1000 UT) tablet Take 1,000 Units by mouth daily.    . insulin detemir (LEVEMIR) 100 UNIT/ML injection Inject 12-15 Units into the skin See admin instructions. Inject 15 units subcutaneously in the morning & 12 units subcutaneously at night.    . lidocaine-prilocaine (EMLA) cream Apply to affected area once 30 g 3  . LORazepam (ATIVAN) 0.5 MG tablet TAKE 1 TABLET BY MOUTH AT BEDTIME AS NEEDED FOR SLEEP 30 tablet 0  . losartan-hydrochlorothiazide (HYZAAR) 100-25 MG tablet Take 1 tablet by mouth daily.  4  . metFORMIN (GLUCOPHAGE) 500 MG tablet Take 500 mg by mouth every morning.      No current facility-administered medications for this encounter.    Physical Findings: The patient is in no acute distress. Patient is alert and oriented.  weight is 205 lb 12.8 oz (93.4 kg). Her temperature is 97.9 F (36.6 C). Her blood pressure is 145/51 (abnormal) and her pulse is 58 (abnormal). Her respiration is 20 and oxygen saturation is 100%.   No significant changes. Lungs are clear to auscultation bilaterally. Heart has regular rate and rhythm. No palpable cervical, supraclavicular, or axillary adenopathy. Abdomen soft, non-tender, normal bowel sounds. Left Breast: no palpable mass, nipple discharge or bleeding. Right Breast: Patient has persistent edema in the left breast.  Some hyperpigmentation changes noted.  Induration noted around the region of the lumpectomy scar  but no dominant mass appreciated in the breast.  No nipple discharge or bleeding.  Lab Findings: Lab Results  Component Value Date   WBC 6.4 02/13/2020   HGB 10.4 (L) 02/13/2020   HCT 32.6 (L) 02/13/2020   MCV 89.8 02/13/2020   PLT 160 02/13/2020    Radiographic Findings: No results found.  Impression: StageIA, (pT1b,pN0, cM0) RightBreast UIQ,Invasive DuctalCarcinomawith DCIS, ER+/ PR-/ Her2+,  Grade3  No evidence of recurrence on clinical exam today.  Patient continues to have some edema in the breast related to her radiation therapy.  She Would like to pursue physical therapy to address this issue and will place order for this later today.  Plan: The patient is scheduled to see Dr. Lindi Adie on 03/26/2020. She will follow-up with radiation oncology in 3 months.  she wishes to continue follow-up in radiation oncology for the next several months.   ____________________________________   Blair Promise, PhD, MD  This document serves as a record of services personally performed by Gery Pray, MD. It was created on his behalf by Clerance Lav, a trained medical scribe. The creation of this record is based on the scribe's personal observations and the provider's statements to them. This document has been checked and approved by the attending provider.

## 2020-02-29 NOTE — Progress Notes (Signed)
Patient here for a 3 month f/u visit.  Pt. denies pain in her right breast. States sometimes it is sore due to the way she sleeps. Reports feeling the sun on her breast when driving. Patient almost finished with radiation. No edema in her right arm and has good ROM.  BP (!) 145/51 (BP Location: Right Arm, Patient Position: Sitting, Cuff Size: Normal)   Pulse (!) 58   Temp 97.9 F (36.6 C)   Resp 20   Wt 205 lb 12.8 oz (93.4 kg)   LMP 02/02/1993 (Exact Date)   SpO2 100%   BMI 36.46 kg/m     Wt Readings from Last 3 Encounters:  02/29/20 205 lb 12.8 oz (93.4 kg)  02/13/20 208 lb 4.8 oz (94.5 kg)  01/23/20 196 lb (88.9 kg)

## 2020-03-04 ENCOUNTER — Ambulatory Visit: Payer: Self-pay | Admitting: Radiation Oncology

## 2020-03-05 ENCOUNTER — Other Ambulatory Visit: Payer: Self-pay

## 2020-03-05 ENCOUNTER — Inpatient Hospital Stay: Payer: Medicare Other | Attending: Hematology and Oncology

## 2020-03-05 VITALS — BP 153/59 | HR 51 | Temp 98.6°F | Resp 18 | Ht 63.0 in | Wt 206.2 lb

## 2020-03-05 DIAGNOSIS — Z5112 Encounter for antineoplastic immunotherapy: Secondary | ICD-10-CM | POA: Diagnosis present

## 2020-03-05 DIAGNOSIS — C50211 Malignant neoplasm of upper-inner quadrant of right female breast: Secondary | ICD-10-CM

## 2020-03-05 DIAGNOSIS — Z170 Estrogen receptor positive status [ER+]: Secondary | ICD-10-CM | POA: Diagnosis not present

## 2020-03-05 MED ORDER — SODIUM CHLORIDE 0.9 % IV SOLN
Freq: Once | INTRAVENOUS | Status: AC
  Filled 2020-03-05: qty 250

## 2020-03-05 MED ORDER — ACETAMINOPHEN 325 MG PO TABS
650.0000 mg | ORAL_TABLET | Freq: Once | ORAL | Status: AC
  Administered 2020-03-05: 650 mg via ORAL

## 2020-03-05 MED ORDER — ACETAMINOPHEN 325 MG PO TABS
ORAL_TABLET | ORAL | Status: AC
  Filled 2020-03-05: qty 2

## 2020-03-05 MED ORDER — TRASTUZUMAB-ANNS CHEMO 150 MG IV SOLR
600.0000 mg | Freq: Once | INTRAVENOUS | Status: AC
  Administered 2020-03-05: 600 mg via INTRAVENOUS
  Filled 2020-03-05: qty 28.57

## 2020-03-05 MED ORDER — SODIUM CHLORIDE 0.9% FLUSH
10.0000 mL | INTRAVENOUS | Status: DC | PRN
  Administered 2020-03-05: 10 mL
  Filled 2020-03-05: qty 10

## 2020-03-05 MED ORDER — DIPHENHYDRAMINE HCL 25 MG PO CAPS
50.0000 mg | ORAL_CAPSULE | Freq: Once | ORAL | Status: AC
  Administered 2020-03-05: 50 mg via ORAL

## 2020-03-05 MED ORDER — HEPARIN SOD (PORK) LOCK FLUSH 100 UNIT/ML IV SOLN
500.0000 [IU] | Freq: Once | INTRAVENOUS | Status: AC | PRN
  Administered 2020-03-05: 500 [IU]
  Filled 2020-03-05: qty 5

## 2020-03-05 MED ORDER — DIPHENHYDRAMINE HCL 25 MG PO CAPS
ORAL_CAPSULE | ORAL | Status: AC
  Filled 2020-03-05: qty 2

## 2020-03-05 NOTE — Patient Instructions (Signed)
[facility] Discharge Instructions for Patients Receiving Chemotherapy  Today you received the following chemotherapy agents: Kanjinti  To help prevent nausea and vomiting after your treatment, we encourage you to take your nausea medication as prescribed.    If you develop nausea and vomiting that is not controlled by your nausea medication, call the clinic.   BELOW ARE SYMPTOMS THAT SHOULD BE REPORTED IMMEDIATELY:  *FEVER GREATER THAN 100.5 F  *CHILLS WITH OR WITHOUT FEVER  NAUSEA AND VOMITING THAT IS NOT CONTROLLED WITH YOUR NAUSEA MEDICATION  *UNUSUAL SHORTNESS OF BREATH  *UNUSUAL BRUISING OR BLEEDING  TENDERNESS IN MOUTH AND THROAT WITH OR WITHOUT PRESENCE OF ULCERS  *URINARY PROBLEMS  *BOWEL PROBLEMS  UNUSUAL RASH Items with * indicate a potential emergency and should be followed up as soon as possible.  Feel free to call the clinic should you have any questions or concerns. The clinic phone number is [phone].  Please show the CHEMO ALERT CARD at check-in to the Emergency Department and triage nurse.   

## 2020-03-07 ENCOUNTER — Other Ambulatory Visit: Payer: Self-pay

## 2020-03-07 ENCOUNTER — Ambulatory Visit: Payer: Medicare Other | Attending: Radiation Oncology | Admitting: Physical Therapy

## 2020-03-07 ENCOUNTER — Encounter: Payer: Self-pay | Admitting: Physical Therapy

## 2020-03-07 DIAGNOSIS — M25611 Stiffness of right shoulder, not elsewhere classified: Secondary | ICD-10-CM | POA: Diagnosis present

## 2020-03-07 DIAGNOSIS — R262 Difficulty in walking, not elsewhere classified: Secondary | ICD-10-CM | POA: Insufficient documentation

## 2020-03-07 DIAGNOSIS — M6281 Muscle weakness (generalized): Secondary | ICD-10-CM | POA: Insufficient documentation

## 2020-03-07 DIAGNOSIS — I890 Lymphedema, not elsewhere classified: Secondary | ICD-10-CM | POA: Diagnosis present

## 2020-03-07 DIAGNOSIS — M79621 Pain in right upper arm: Secondary | ICD-10-CM | POA: Insufficient documentation

## 2020-03-07 DIAGNOSIS — R293 Abnormal posture: Secondary | ICD-10-CM | POA: Insufficient documentation

## 2020-03-07 NOTE — Therapy (Signed)
[place], [name], [address] Phone: [phone]   Fax:  [phone]  Physical Therapy Evaluation  Patient Details  Name: Kathy Reese MRN: [id number] Date of Birth: 05/11/1951 Referring Provider (PT): Kinard   Encounter Date: 03/07/2020  PT End of Session - 03/07/20 0949    Visit Number  1    Number of Visits  9    Date for PT Re-Evaluation  04/04/20    PT Start Time  0906    PT Stop Time  0947    PT Time Calculation (min)  41 min    Activity Tolerance  Patient tolerated treatment well    Behavior During Therapy  [facility] for tasks assessed/performed       Past Medical History:  Diagnosis Date  . Allergic rhinitis, cause unspecified   . Arthritis   . Depression   . Diverticulosis   . History of Bell's palsy   . History of diabetes mellitus   . Hypersomnia   . OSA on CPAP    not using at this time 06/15/19  . Other and unspecified hyperlipidemia   . Personal history of malignant neoplasm of ovary   . Type II or unspecified type diabetes mellitus without mention of complication, not stated as uncontrolled   . Unspecified essential hypertension   . Vertigo     Past Surgical History:  Procedure Laterality Date  . ABDOMINAL ADHESION SURGERY  12/96   exc peritoneal cyst  . BREAST BIOPSY Left 2000   stereotactic, negative  . BREAST LUMPECTOMY WITH RADIOACTIVE SEED AND SENTINEL LYMPH NODE BIOPSY Right 05/30/2019   Procedure: RIGHT BREAST LUMPECTOMY WITH BRACKETED RADIOACTIVE SEEDS  AND SENTINEL LYMPH NODE BIOPSY;  Surgeon: Stark Klein, MD;  Location: [place];  Service: General;  Laterality: Right;  . BREAST REDUCTION SURGERY Bilateral 10/03  . EYE SURGERY Left    cataracts  . LAPAROSCOPIC SALPINGO OOPHERECTOMY Right 12/96  . 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 LAPAROSCOPY ABDOMEN DIAGNOSTIC  2/08   w/LOA, LSO  . PORTACATH PLACEMENT  Left 06/16/2019   Procedure: INSERTION PORT-A-CATH;  Surgeon: Stark Klein, MD;  Location: [place];  Service: General;  Laterality: Left;  . REDUCTION MAMMAPLASTY Bilateral 2004  . TONSILLECTOMY    . TOTAL ABDOMINAL HYSTERECTOMY W/ BILATERAL SALPINGOOPHORECTOMY  5/94   secondary to uterine fibroids  . TOTAL KNEE ARTHROPLASTY Right 05/12/2013   Procedure: TOTAL KNEE ARTHROPLASTY;  Surgeon: Alta Corning, MD;  Location: [place];  Service: Orthopedics;  Laterality: Right;  . TRIGGER FINGER RELEASE  07/06/2012   Procedure: RELEASE TRIGGER FINGER/A-1 PULLEY;  Surgeon: Alta Corning, MD;  Location: [place];  Service: Orthopedics;  Laterality: Left;  left ring finger  . TUBAL LIGATION      There were no vitals filed for this visit.   Subjective Assessment - 03/07/20 0908    Subjective  Sometimes my arm aches and I wear that sleeve. I don't feel like my breast is swelling too much but it does seem to have scar tissue.    Pertinent History  R breast cancer, s/p R lumpectomy and SLNB on 05/30/19, 3 nodes removed all negative, currently undergoing chemo, pt will begin radiation after chemo    Patient Stated Goals  be able to move my arm, soften the areas that are hard in the breast    Currently in Pain?  No/denies  Pain Score  0-No pain         [facility] PT Assessment - 03/07/20 0001      Assessment   Medical Diagnosis  R breast cancer    Referring Provider (PT)  Kinard    Onset Date/Surgical Date  05/30/19    Hand Dominance  Right    Prior Therapy  was seen for lymphedema and ROM at this clnic from 08/11/19 to 11/06/19      Precautions   Precautions  Other (comment)    Precaution Comments  at risk for lymphedema      Restrictions   Weight Bearing Restrictions  No      Balance Screen   Has the patient fallen in the past 6 months  No    Has the patient had a decrease in activity level because of a fear of falling?   No    Is the patient reluctant to leave their home because of  a fear of falling?   No      Home Film/video editor residence    Living Arrangements  Spouse/significant other;Children    Available Help at Discharge  Family    Type of [place]      Prior Function   Level of [place]  Retired    Leisure  gardens, [place] yard, works outside a lot      Cognition   Overall Cognitive Status  Within Functional Limits for tasks assessed      Observation/Other Assessments   Observations  fibrosis present in superior R breast      ROM / Strength   AROM / PROM / Strength  AROM      AROM   Right Shoulder Flexion  126 Degrees    Right Shoulder ABduction  114 Degrees    Right Shoulder Internal Rotation  60 Degrees    Right Shoulder External Rotation  65 Degrees    Left Shoulder Flexion  155 Degrees    Left Shoulder ABduction  175 Degrees    Left Shoulder Internal Rotation  68 Degrees    Left Shoulder External Rotation  90 Degrees        LYMPHEDEMA/ONCOLOGY QUESTIONNAIRE - 03/07/20 0925      Right Upper Extremity Lymphedema   15 cm Proximal to Olecranon Process  41.5 cm    Olecranon Process  28.5 cm    15 cm Proximal to Ulnar Styloid Process  26.4 cm    Just Proximal to Ulnar Styloid Process  19 cm    Across Hand at [employer]  19.3 cm    At [place] of 2nd Digit  6.6 cm      Left Upper Extremity Lymphedema   15 cm Proximal to Olecranon Process  40.5 cm    Olecranon Process  28.5 cm    15 cm Proximal to Ulnar Styloid Process  26.5 cm    Just Proximal to Ulnar Styloid Process  18 cm    Across Hand at [employer]  19.1 cm    At [place] of 2nd Digit  6.7 cm               Objective measurements completed on examination: See above findings.      [facility] Adult PT Treatment/Exercise - 03/07/20 0001      Shoulder Exercises: Supine   Flexion  AAROM;Both;10 reps;Other (comment)   with dowel with 10 sec holds  ABduction  AAROM;Right;10 reps;Other (comment)   with dowel with 10  sec holds, v/c and t/c for correct techni            PT Education - 03/07/20 0948    Education Details  wear compression sleeve when working outside and wear upf shirt for sun protection on radiated skin, supine dowel exercises for R shoulder ROM    Person(s) Educated  Patient    Methods  Explanation;Handout    Comprehension  Verbalized understanding          PT Long Term Goals - 03/07/20 0953      PT LONG TERM GOAL #1   Title  Pt will demonstrate 155 degrees of R shoulder flexion to allow her to reach overhead.    Baseline  R 126    Time  4    Period  Weeks    Status  New    Target Date  04/04/20      PT LONG TERM GOAL #2   Title  Pt will demonstrates 160 degrees of R shoulder abduction to allow her to reach out to the side.    Baseline  114    Time  4    Period  Weeks    Status  New    Target Date  04/04/20      PT LONG TERM GOAL #3   Title  Pt will be able to lift R arm above head without being limited by tightness and discomfort in R axilla.    Time  4    Period  Weeks    Status  New    Target Date  04/04/20      PT LONG TERM GOAL #4   Title  Pt will be independent in a home exercise program for continued strengthening and stretching.    Time  4    Period  Weeks    Status  New    Target Date  04/04/20             Plan - 03/07/20 0949    Clinical Impression Statement  Pt presents to PT with decreased R shoulder ROM, axillary tightness and swelling and fibrosis present in R breast. She was seen previously at this clinic from 08/2019 to 11/2019 for lymphedema and ROM and was discharged after meeting all goals. She has a history of R breast cancer s/p R lumpectomy and SLNB 05/30/19 and has completed radiation and will be completing chemo by 06/2020. Pt would benefit from skilled PT services to increased R shoulder ROM, decrease R axillary tightness and decrease R breast lymphedema.    Personal Factors and Comorbidities  Comorbidity 1;Comorbidity 2     Comorbidities  diabetes, ovarian cancer,    Examination-Activity Limitations  Reach Overhead;Carry;Lift    Examination-Participation Restrictions  Cleaning;Meal Prep;Laundry;Yard Work    Stability/Clinical Decision Making  Evolving/Moderate complexity    Clinical Decision Making  Moderate    Rehab Potential  Excellent    PT Frequency  2x / week    PT Duration  4 weeks    PT Treatment/Interventions  ADLs/Self Care Home Management;Therapeutic activities;Therapeutic exercise;Patient/family education;Manual lymph drainage;Manual techniques;Compression bandaging;Scar mobilization;Passive range of motion;Taping;Joint Manipulations    PT Next Visit Plan  see if pt found script for compression and if not send new one, MLD to R breast, MFR to R axilla, ROM R shoulder, see if pt is wearing chip pack and if it has helped    PT Home Exercise Plan  supine  dowel exercises    Consulted and Agree with Plan of Care  Patient       Patient will benefit from skilled therapeutic intervention in order to improve the following deficits and impairments:  Decreased range of motion, Decreased scar mobility, Increased edema, Decreased knowledge of precautions, Decreased strength, Increased fascial restricitons, Impaired UE functional use, Postural dysfunction, Pain  Visit Diagnosis: Lymphedema, not elsewhere classified - Plan: PT plan of care cert/re-cert  Stiffness of right shoulder, not elsewhere classified - Plan: PT plan of care cert/re-cert  Muscle weakness (generalized) - Plan: PT plan of care cert/re-cert  Abnormal posture - Plan: PT plan of care cert/re-cert     Problem List Patient Active Problem List   Diagnosis Date Noted  . Port-A-Cath in place 07/11/2019  . Malignant neoplasm of upper-inner quadrant of right breast in female, estrogen receptor positive ([place]) 04/19/2019  . Facial weakness 11/07/2018  . 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 History of  hysterectomy 10/12/2018  . History of bilateral salpingo-oophorectomy (BSO) 10/12/2018  . Osteoarthritis of right knee 05/12/2013  . HYPERLIPIDEMIA 12/30/2007  . HYPERTENSION 12/30/2007  . INSOMNIA 12/30/2007  . HYPERSOMNIA 12/30/2007  . DM w/o Complication Type II [id number]  . HYPERCHOLESTEROLEMIA 12/16/2007  . EXOGENOUS OBESITY 12/16/2007  . SLEEP APNEA, OBSTRUCTIVE 12/16/2007  . ALLERGIC RHINITIS 12/16/2007    [facility]ly[facility] Sabal [facility] 03/07/2020, 9:59 AM  [place], [name], [address] Phone: [phone]   Fax:  [phone]  Name: Kathy Reese MRN: [id number] Date of Birth: 1950-12-10  Manus Gunning, PT 03/07/20 9:59 AM

## 2020-03-07 NOTE — Patient Instructions (Signed)
Shoulder: Flexion (Supine)    With hands shoulder width apart, slowly lower dowel to floor behind head. Do not let elbows bend. Keep back flat. Hold _10-15___ seconds. Repeat _10___ times. Do _2___ sessions per day. CAUTION: Stretch slowly and gently.  Copyright  VHI. All rights reserved.  Shoulder: Abduction (Supine)    With right arm flat on floor, hold dowel in palm. Slowly move arm up to side of head by pushing with opposite arm. Do not let elbow bend. Hold _10-15___ seconds. Repeat _10___ times. Do _2___ sessions per day. CAUTION: Stretch slowly and gently.  Copyright  VHI. All rights reserved.   

## 2020-03-11 ENCOUNTER — Telehealth: Payer: Self-pay | Admitting: *Deleted

## 2020-03-11 NOTE — Telephone Encounter (Signed)
CALLED PATIENT TO INFORM OF 3 MONTH FU WITH DR. [place] ON 06-03-20 @ 11 AM, SPOKE WITH PATIENT AND SHE IS AWARE OF THIS APPT.

## 2020-03-12 ENCOUNTER — Other Ambulatory Visit: Payer: Self-pay

## 2020-03-12 ENCOUNTER — Encounter: Payer: Self-pay | Admitting: Physical Therapy

## 2020-03-12 ENCOUNTER — Ambulatory Visit: Payer: Medicare Other | Admitting: Physical Therapy

## 2020-03-12 ENCOUNTER — Encounter (INDEPENDENT_AMBULATORY_CARE_PROVIDER_SITE_OTHER): Payer: Self-pay

## 2020-03-12 DIAGNOSIS — I890 Lymphedema, not elsewhere classified: Secondary | ICD-10-CM | POA: Diagnosis not present

## 2020-03-12 DIAGNOSIS — M25611 Stiffness of right shoulder, not elsewhere classified: Secondary | ICD-10-CM

## 2020-03-12 DIAGNOSIS — R293 Abnormal posture: Secondary | ICD-10-CM

## 2020-03-12 NOTE — Therapy (Signed)
[place], [name], [address] Phone: [phone]   Fax:  [phone]  Physical Therapy Treatment  Patient Details  Name: Kathy Reese MRN: [id number] Date of Birth: Jul 29, 1951 Referring Provider (PT): Kinard   Encounter Date: 03/12/2020  PT End of Session - 03/12/20 0902    Visit Number  2    Number of Visits  9    Date for PT Re-Evaluation  04/04/20    PT Start Time  0806    PT Stop Time  0900    PT Time Calculation (min)  54 min    Activity Tolerance  Patient tolerated treatment well    Behavior During Therapy  [facility] for tasks assessed/performed       Past Medical History:  Diagnosis Date  . Allergic rhinitis, cause unspecified   . Arthritis   . Depression   . Diverticulosis   . History of Bell's palsy   . History of diabetes mellitus   . Hypersomnia   . OSA on CPAP    not using at this time 06/15/19  . Other and unspecified hyperlipidemia   . Personal history of malignant neoplasm of ovary   . Type II or unspecified type diabetes mellitus without mention of complication, not stated as uncontrolled   . Unspecified essential hypertension   . Vertigo     Past Surgical History:  Procedure Laterality Date  . ABDOMINAL ADHESION SURGERY  12/96   exc peritoneal cyst  . BREAST BIOPSY Left 2000   stereotactic, negative  . BREAST LUMPECTOMY WITH RADIOACTIVE SEED AND SENTINEL LYMPH NODE BIOPSY Right 05/30/2019   Procedure: RIGHT BREAST LUMPECTOMY WITH BRACKETED RADIOACTIVE SEEDS  AND SENTINEL LYMPH NODE BIOPSY;  Surgeon: Stark Klein, MD;  Location: [place];  Service: General;  Laterality: Right;  . BREAST REDUCTION SURGERY Bilateral 10/03  . EYE SURGERY Left    cataracts  . LAPAROSCOPIC SALPINGO OOPHERECTOMY Right 12/96  . 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 LAPAROSCOPY ABDOMEN DIAGNOSTIC  2/08   w/LOA, LSO  . PORTACATH PLACEMENT  Left 06/16/2019   Procedure: INSERTION PORT-A-CATH;  Surgeon: Stark Klein, MD;  Location: [place];  Service: General;  Laterality: Left;  . REDUCTION MAMMAPLASTY Bilateral 2004  . TONSILLECTOMY    . TOTAL ABDOMINAL HYSTERECTOMY W/ BILATERAL SALPINGOOPHORECTOMY  5/94   secondary to uterine fibroids  . TOTAL KNEE ARTHROPLASTY Right 05/12/2013   Procedure: TOTAL KNEE ARTHROPLASTY;  Surgeon: Alta Corning, MD;  Location: [place];  Service: Orthopedics;  Laterality: Right;  . TRIGGER FINGER RELEASE  07/06/2012   Procedure: RELEASE TRIGGER FINGER/A-1 PULLEY;  Surgeon: Alta Corning, MD;  Location: [place];  Service: Orthopedics;  Laterality: Left;  left ring finger  . TUBAL LIGATION      There were no vitals filed for this visit.  Subjective Assessment - 03/12/20 0808    Subjective  My breast swelling is about the same. The chip pack helped. I have wearing it constantly.    Pertinent History  R breast cancer, s/p R lumpectomy and SLNB on 05/30/19, 3 nodes removed all negative, currently undergoing chemo, pt will begin radiation after chemo    Patient Stated Goals  be able to move my arm, soften the areas that are hard in the breast    Currently in Pain?  No/denies    Pain Score  0-No pain  [facility] Adult PT Treatment/Exercise - 03/12/20 0001      Manual Therapy   Manual Therapy  Manual Lymphatic Drainage (MLD);Passive ROM;Myofascial release;Soft tissue mobilization    Soft tissue mobilization  along R pec muscle to decrease tightness    Myofascial Release  to R axilla in area of tightness    Manual Lymphatic Drainage (MLD)  short neck, superficial and deep abdominals, left axillary nodes and establishment of interaxillary pathway, right inguinal nodes and establishment of axillo inguinal pathway, right breast moving fluid towards pathways- verbally instructing pt throughout sequence and hand technique and issued handout at end of session     Passive ROM  with prolonged holds to R shoulder in direction of flexion and abduction while performing STM and MFR to R axilla and pec                  PT Long Term Goals - 03/07/20 0953      PT LONG TERM GOAL #1   Title  Pt will demonstrate 155 degrees of R shoulder flexion to allow her to reach overhead.    Baseline  R 126    Time  4    Period  Weeks    Status  New    Target Date  04/04/20      PT LONG TERM GOAL #2   Title  Pt will demonstrates 160 degrees of R shoulder abduction to allow her to reach out to the side.    Baseline  114    Time  4    Period  Weeks    Status  New    Target Date  04/04/20      PT LONG TERM GOAL #3   Title  Pt will be able to lift R arm above head without being limited by tightness and discomfort in R axilla.    Time  4    Period  Weeks    Status  New    Target Date  04/04/20      PT LONG TERM GOAL #4   Title  Pt will be independent in a home exercise program for continued strengthening and stretching.    Time  4    Period  Weeks    Status  New    Target Date  04/04/20            Plan - 03/12/20 1004    Clinical Impression Statement  Began MLD to R breast and instructed pt throughout. Issued handout to pt to follow at home to practice self MLD. Emphasized importance of skin stretch instead of gliding over skin. Also worked on myofascial release to R axilla to help decrease pain and discomfort in this area. Soft tissue mobilization performed to R pec muscle in area of tightness. R shoulder abduction and flexion improved with PROM and manual techniques.    PT Frequency  2x / week    PT Duration  4 weeks    PT Treatment/Interventions  ADLs/Self Care Home Management;Therapeutic activities;Therapeutic exercise;Patient/family education;Manual lymph drainage;Manual techniques;Compression bandaging;Scar mobilization;Passive range of motion;Taping;Joint Manipulations    PT Next Visit Plan  check for signed script for compression bra,  MLD to R breast, MFR to R axilla, ROM R shoulder, see if pt is wearing chip pack and if it has helped    PT Home Exercise Plan  supine dowel exercises, self MLD    Recommended Other Services  sent script to MD for compression bra    Consulted and Agree with  Plan of Care  Patient       Patient will benefit from skilled therapeutic intervention in order to improve the following deficits and impairments:  Decreased range of motion, Decreased scar mobility, Increased edema, Decreased knowledge of precautions, Decreased strength, Increased fascial restricitons, Impaired UE functional use, Postural dysfunction, Pain  Visit Diagnosis: Lymphedema, not elsewhere classified  Stiffness of right shoulder, not elsewhere classified  Abnormal posture     Problem List Patient Active Problem List   Diagnosis Date Noted  . Port-A-Cath in place 07/11/2019  . Malignant neoplasm of upper-inner quadrant of right breast in female, estrogen receptor positive ([place]) 04/19/2019  . Facial weakness 11/07/2018  . 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 History of hysterectomy 10/12/2018  . History of bilateral salpingo-oophorectomy (BSO) 10/12/2018  . Osteoarthritis of right knee 05/12/2013  . HYPERLIPIDEMIA 12/30/2007  . HYPERTENSION 12/30/2007  . INSOMNIA 12/30/2007  . HYPERSOMNIA 12/30/2007  . DM w/o Complication Type II 73/57/8978  . HYPERCHOLESTEROLEMIA 12/16/2007  . EXOGENOUS OBESITY 12/16/2007  . SLEEP APNEA, OBSTRUCTIVE 12/16/2007  . ALLERGIC RHINITIS 12/16/2007    [facility] 03/12/2020, 10:07 AM  [place], [name], [address] Phone: [phone]   Fax:  [phone]  Name: Anyelin Mogle MRN: [id number] Date of Birth: 12/04/1950  Manus Gunning, PT 03/12/20 10:07 AM

## 2020-03-12 NOTE — Patient Instructions (Signed)
Self manual lymph drainage: Perform this sequence once a day.  Only give enough pressure no your skin to make the skin move.  Diaphragmatic - Supine   Inhale through nose making navel move out toward hands. Exhale through puckered lips, hands follow navel in. Repeat _5__ times. Rest _10__ seconds between repeats.   Copyright  VHI. All rights reserved.  Hug yourself.  Do circles at your neck just above your collarbones.  Repeat this 10 times.  Axilla - One at a Time   Using full weight of flat hand and fingers at center of uninvolved armpit, make _10__ in-place circles.   Copyright  VHI. All rights reserved.  LEG: Inguinal Nodes Stimulation   With small finger side of hand against hip crease on involved side, gently perform circles at the crease. Repeat __10_ times.   Copyright  VHI. All rights reserved.  1) Axilla to Inguinal Nodes - Sweep   On involved side, sweep _4__ times from armpit along side of trunk to hip crease.  Now gently stretch skin from the involved side to the uninvolved side across the chest at the shoulder line.  Repeat that 4 times.  Draw an imaginary diagonal line from upper outer breast through the nipple area toward lower inner breast.  Direct fluid upward and inward from this line toward the pathway across your upper chest .  Do this in three rows to treat all of the upper inner breast tissue, and do each row 3-4x.      Direct fluid to treat all of lower outer breast tissue downward and outward toward pathway that is aimed at the right groin.  Finish by doing the pathways as described above going from your involved armpit to the same side groin and going across your upper chest from the involved shoulder to the uninvolved shoulder.  Repeat the steps above where you do circles in your right groin and left armpit. Copyright  VHI. All rights reserved.  

## 2020-03-14 ENCOUNTER — Ambulatory Visit: Payer: Medicare Other

## 2020-03-14 ENCOUNTER — Other Ambulatory Visit: Payer: Self-pay

## 2020-03-14 DIAGNOSIS — R293 Abnormal posture: Secondary | ICD-10-CM

## 2020-03-14 DIAGNOSIS — M6281 Muscle weakness (generalized): Secondary | ICD-10-CM

## 2020-03-14 DIAGNOSIS — I890 Lymphedema, not elsewhere classified: Secondary | ICD-10-CM | POA: Diagnosis not present

## 2020-03-14 DIAGNOSIS — M25611 Stiffness of right shoulder, not elsewhere classified: Secondary | ICD-10-CM

## 2020-03-14 NOTE — Therapy (Signed)
[place], [name], [address] Phone: [phone]   Fax:  [phone]  Physical Therapy Treatment  Patient Details  Name: Kathy Reese MRN: [id number] Date of Birth: 01/23/1951 Referring Provider (PT): Kinard   Encounter Date: 03/14/2020   PT End of Session - 03/14/20 9024    Visit Number 3    Number of Visits 9    Date for PT Re-Evaluation 04/04/20    PT Start Time 0913   pt arrived late today   PT Stop Time 1003    PT Time Calculation (min) 50 min    Activity Tolerance Patient tolerated treatment well    Behavior During Therapy [facility] for tasks assessed/performed           Past Medical History:  Diagnosis Date  . Allergic rhinitis, cause unspecified   . Arthritis   . Depression   . Diverticulosis   . History of Bell's palsy   . History of diabetes mellitus   . Hypersomnia   . OSA on CPAP    not using at this time 06/15/19  . Other and unspecified hyperlipidemia   . Personal history of malignant neoplasm of ovary   . Type II or unspecified type diabetes mellitus without mention of complication, not stated as uncontrolled   . Unspecified essential hypertension   . Vertigo     Past Surgical History:  Procedure Laterality Date  . ABDOMINAL ADHESION SURGERY  12/96   exc peritoneal cyst  . BREAST BIOPSY Left 2000   stereotactic, negative  . BREAST LUMPECTOMY WITH RADIOACTIVE SEED AND SENTINEL LYMPH NODE BIOPSY Right 05/30/2019   Procedure: RIGHT BREAST LUMPECTOMY WITH BRACKETED RADIOACTIVE SEEDS  AND SENTINEL LYMPH NODE BIOPSY;  Surgeon: Stark Klein, MD;  Location: [place];  Service: General;  Laterality: Right;  . BREAST REDUCTION SURGERY Bilateral 10/03  . EYE SURGERY Left    cataracts  . LAPAROSCOPIC SALPINGO OOPHERECTOMY Right 12/96  . 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 LAPAROSCOPY ABDOMEN DIAGNOSTIC  2/08   w/LOA, LSO  .  PORTACATH PLACEMENT Left 06/16/2019   Procedure: INSERTION PORT-A-CATH;  Surgeon: Stark Klein, MD;  Location: [place];  Service: General;  Laterality: Left;  . REDUCTION MAMMAPLASTY Bilateral 2004  . TONSILLECTOMY    . TOTAL ABDOMINAL HYSTERECTOMY W/ BILATERAL SALPINGOOPHORECTOMY  5/94   secondary to uterine fibroids  . TOTAL KNEE ARTHROPLASTY Right 05/12/2013   Procedure: TOTAL KNEE ARTHROPLASTY;  Surgeon: Alta Corning, MD;  Location: [place];  Service: Orthopedics;  Laterality: Right;  . TRIGGER FINGER RELEASE  07/06/2012   Procedure: RELEASE TRIGGER FINGER/A-1 PULLEY;  Surgeon: Alta Corning, MD;  Location: [place];  Service: Orthopedics;  Laterality: Left;  left ring finger  . TUBAL LIGATION      There were no vitals filed for this visit.   Subjective Assessment - 03/14/20 0916    Subjective I've been doing the self MLD and wearing the foam and I can tell the top of my breast isn't as hard as it used to be. My Rt upper trap is a little sore today, but I think I just slept on it wrong.    Pertinent History R breast cancer, s/p R lumpectomy and SLNB on 05/30/19, 3 nodes removed all negative, currently undergoing chemo, pt will begin radiation after chemo    Patient Stated Goals be able to move my arm, soften the areas  that are hard in the breast    Currently in Pain? No/denies                             [facility] Adult PT Treatment/Exercise - 03/14/20 0001      Manual Therapy   Manual Therapy Manual Lymphatic Drainage (MLD);Passive ROM;Myofascial release;Soft tissue mobilization    Soft tissue mobilization --    Myofascial Release to R axilla in area of tightness    Manual Lymphatic Drainage (MLD) short neck, superficial and deep abdominals, left axillary nodes and establishment of interaxillary pathway, right inguinal nodes and establishment of axillo inguinal pathway, right breast moving fluid towards pathways; then into Lt S/L for further work at lateral  breast along Rt axillo-inguinal anastomosis, then finished retracing steps in supine.    Passive ROM with prolonged holds to R shoulder in direction of flexion and abduction, and D2 to tolerance                       PT Long Term Goals - 03/07/20 0953      PT LONG TERM GOAL #1   Title Pt will demonstrate 155 degrees of R shoulder flexion to allow her to reach overhead.    Baseline R 126    Time 4    Period Weeks    Status New    Target Date 04/04/20      PT LONG TERM GOAL #2   Title Pt will demonstrates 160 degrees of R shoulder abduction to allow her to reach out to the side.    Baseline 114    Time 4    Period Weeks    Status New    Target Date 04/04/20      PT LONG TERM GOAL #3   Title Pt will be able to lift R arm above head without being limited by tightness and discomfort in R axilla.    Time 4    Period Weeks    Status New    Target Date 04/04/20      PT LONG TERM GOAL #4   Title Pt will be independent in a home exercise program for continued strengthening and stretching.    Time 4    Period Weeks    Status New    Target Date 04/04/20                 Plan - 03/14/20 1015    Clinical Impression Statement Continued Rt breast MLD today in supine and Lt S/L. Also continued P/ROM and myofascial release of Rt shoulder. Some increased discomfort at end range abduction that improved during session. Pt has one compression bra so issued script to see if pt can get more compression bras from [employer] [facility].    Personal Factors and Comorbidities Comorbidity 1;Comorbidity 2    Comorbidities diabetes, ovarian cancer,    Examination-Activity Limitations Reach Overhead;Carry;Lift    Examination-Participation Restrictions Cleaning;Meal Prep;Laundry;Yard Work    Merchant navy officer Evolving/Moderate complexity    Rehab Potential Excellent    PT Frequency 2x / week    PT Duration 4 weeks    PT Treatment/Interventions ADLs/Self Care Home  Management;Therapeutic activities;Therapeutic exercise;Patient/family education;Manual lymph drainage;Manual techniques;Compression bandaging;Scar mobilization;Passive range of motion;Taping;Joint Manipulations    PT Next Visit Plan MLD to R breast, MFR to R axilla, ROM R shoulder, was she able to get more bras at [employer] [facility] [place]?    PT  Home Exercise Plan supine dowel exercises, self MLD    Consulted and Agree with Plan of Care Patient           Patient will benefit from skilled therapeutic intervention in order to improve the following deficits and impairments:  Decreased range of motion, Decreased scar mobility, Increased edema, Decreased knowledge of precautions, Decreased strength, Increased fascial restricitons, Impaired UE functional use, Postural dysfunction, Pain  Visit Diagnosis: Lymphedema, not elsewhere classified  Stiffness of right shoulder, not elsewhere classified  Abnormal posture  Muscle weakness (generalized)     Problem List Patient Active Problem List   Diagnosis Date Noted  . Port-A-Cath in place 07/11/2019  . Malignant neoplasm of upper-inner quadrant of right breast in female, estrogen receptor positive ([place]) 04/19/2019  . Facial weakness 11/07/2018  . 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 History of hysterectomy 10/12/2018  . History of bilateral salpingo-oophorectomy (BSO) 10/12/2018  . Osteoarthritis of right knee 05/12/2013  . HYPERLIPIDEMIA 12/30/2007  . HYPERTENSION 12/30/2007  . INSOMNIA 12/30/2007  . HYPERSOMNIA 12/30/2007  . DM w/o Complication Type II 82/51/8984  . HYPERCHOLESTEROLEMIA 12/16/2007  . EXOGENOUS OBESITY 12/16/2007  . SLEEP APNEA, OBSTRUCTIVE 12/16/2007  . ALLERGIC RHINITIS 12/16/2007    Otelia Limes, PTA 03/14/2020, 10:24 AM  [place], [name], [address] Phone: [phone]   Fax:   [phone]  Name: Kathy Reese MRN: [id number] Date of Birth: 1951-10-04

## 2020-03-19 ENCOUNTER — Other Ambulatory Visit: Payer: Self-pay

## 2020-03-19 ENCOUNTER — Ambulatory Visit: Payer: Medicare Other

## 2020-03-19 DIAGNOSIS — I890 Lymphedema, not elsewhere classified: Secondary | ICD-10-CM | POA: Diagnosis not present

## 2020-03-19 DIAGNOSIS — R293 Abnormal posture: Secondary | ICD-10-CM

## 2020-03-19 DIAGNOSIS — M25611 Stiffness of right shoulder, not elsewhere classified: Secondary | ICD-10-CM

## 2020-03-19 DIAGNOSIS — M6281 Muscle weakness (generalized): Secondary | ICD-10-CM

## 2020-03-19 NOTE — Therapy (Signed)
[place], [name], [address] Phone: [phone]   Fax:  [phone]  Physical Therapy Treatment  Patient Details  Name: Kathy Reese MRN: [id number] Date of Birth: [birth year] Referring Provider (PT): Kinard   Encounter Date: 03/19/2020   PT End of Session - 03/19/20 0903    Visit Number 4    Number of Visits 9    Date for PT Re-Evaluation 04/04/20    PT Start Time 0811    PT Stop Time 0904    PT Time Calculation (min) 53 min    Activity Tolerance Patient tolerated treatment well    Behavior During Therapy [facility] for tasks assessed/performed           Past Medical History:  Diagnosis Date  . Allergic rhinitis, cause unspecified   . Arthritis   . Depression   . Diverticulosis   . History of Bell's palsy   . History of diabetes mellitus   . Hypersomnia   . OSA on CPAP    not using at this time 06/15/19  . Other and unspecified hyperlipidemia   . Personal history of malignant neoplasm of ovary   . Type II or unspecified type diabetes mellitus without mention of complication, not stated as uncontrolled   . Unspecified essential hypertension   . Vertigo     Past Surgical History:  Procedure Laterality Date  . ABDOMINAL ADHESION SURGERY  12/96   exc peritoneal cyst  . BREAST BIOPSY Left 2000   stereotactic, negative  . BREAST LUMPECTOMY WITH RADIOACTIVE SEED AND SENTINEL LYMPH NODE BIOPSY Right 05/30/2019   Procedure: RIGHT BREAST LUMPECTOMY WITH BRACKETED RADIOACTIVE SEEDS  AND SENTINEL LYMPH NODE BIOPSY;  Surgeon: Stark Klein, MD;  Location: [place];  Service: General;  Laterality: Right;  . BREAST REDUCTION SURGERY Bilateral 10/03  . EYE SURGERY Left    cataracts  . LAPAROSCOPIC SALPINGO OOPHERECTOMY Right 12/96  . 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 LAPAROSCOPY ABDOMEN DIAGNOSTIC  2/08   w/LOA, LSO  . PORTACATH PLACEMENT  Left 06/16/2019   Procedure: INSERTION PORT-A-CATH;  Surgeon: Stark Klein, MD;  Location: [place];  Service: General;  Laterality: Left;  . REDUCTION MAMMAPLASTY Bilateral 2004  . TONSILLECTOMY    . TOTAL ABDOMINAL HYSTERECTOMY W/ BILATERAL SALPINGOOPHORECTOMY  5/94   secondary to uterine fibroids  . TOTAL KNEE ARTHROPLASTY Right 05/12/2013   Procedure: TOTAL KNEE ARTHROPLASTY;  Surgeon: Alta Corning, MD;  Location: [place];  Service: Orthopedics;  Laterality: Right;  . TRIGGER FINGER RELEASE  07/06/2012   Procedure: RELEASE TRIGGER FINGER/A-1 PULLEY;  Surgeon: Alta Corning, MD;  Location: [place];  Service: Orthopedics;  Laterality: Left;  left ring finger  . TUBAL LIGATION      There were no vitals filed for this visit.   Subjective Assessment - 03/19/20 0818    Subjective I can still feel the pull into my Rt breast at the end of my ROM but my reach is improving. My breast is slowly getting softer at the top of my breast.    Pertinent History R breast cancer, s/p R lumpectomy and SLNB on 05/30/19, 3 nodes removed all negative, currently undergoing chemo, pt will begin radiation after chemo    Patient Stated Goals be able to move my arm, soften the areas that are hard in the breast    Currently in Pain? No/denies  [facility] PT Assessment - 03/19/20 0001      AROM   Right Shoulder Flexion 132 Degrees    Right Shoulder ABduction 122 Degrees    Right Shoulder Internal Rotation 63 Degrees                         [facility] Adult PT Treatment/Exercise - 03/19/20 0001      Manual Therapy   Manual Therapy Manual Lymphatic Drainage (MLD);Passive ROM;Myofascial release;Soft tissue mobilization    Myofascial Release to R axilla in area of tightness    Manual Lymphatic Drainage (MLD) short neck, superficial and deep abdominals, left axillary nodes and establishment of interaxillary pathway, right inguinal nodes and establishment of axillo inguinal pathway,  right breast moving fluid towards pathways; then into Lt S/L for further work at lateral breast along Rt axillo-inguinal anastomosis, then finished retracing steps in supine.    Passive ROM with prolonged holds to R shoulder in direction of flexion and abduction, and D2 to tolerance                       PT Long Term Goals - 03/07/20 0953      PT LONG TERM GOAL #1   Title Pt will demonstrate 155 degrees of R shoulder flexion to allow her to reach overhead.    Baseline R 126    Time 4    Period Weeks    Status New    Target Date 04/04/20      PT LONG TERM GOAL #2   Title Pt will demonstrates 160 degrees of R shoulder abduction to allow her to reach out to the side.    Baseline 114    Time 4    Period Weeks    Status New    Target Date 04/04/20      PT LONG TERM GOAL #3   Title Pt will be able to lift R arm above head without being limited by tightness and discomfort in R axilla.    Time 4    Period Weeks    Status New    Target Date 04/04/20      PT LONG TERM GOAL #4   Title Pt will be independent in a home exercise program for continued strengthening and stretching.    Time 4    Period Weeks    Status New    Target Date 04/04/20                 Plan - 03/19/20 0903    Clinical Impression Statement Continued with manual lymph drainage of Rt breast and P/ROM of Rt shoulder. Her A/ROM has improved and she reports noticing tightness is starting to resolve. Reviewed MLD with pt as she required mutliple hand over hand pressure for correct direction of stretches. She will benefit from further review of this.    Personal Factors and Comorbidities Comorbidity 1;Comorbidity 2    Comorbidities diabetes, ovarian cancer,    Examination-Activity Limitations Reach Overhead;Carry;Lift    Examination-Participation Restrictions Cleaning;Meal Prep;Laundry;Yard Work    Merchant navy officer Evolving/Moderate complexity    Rehab Potential Excellent    PT  Frequency 2x / week    PT Duration 4 weeks    PT Treatment/Interventions ADLs/Self Care Home Management;Therapeutic activities;Therapeutic exercise;Patient/family education;Manual lymph drainage;Manual techniques;Compression bandaging;Scar mobilization;Passive range of motion;Taping;Joint Manipulations    PT Next Visit Plan MLD to R breast, MFR to R axilla, ROM R shoulder, was she able  to get more bras at [employer] [facility] [place]?; add pulleys and ball roll up wall    PT Home Exercise Plan supine dowel exercises, self MLD    Consulted and Agree with Plan of Care Patient           Patient will benefit from skilled therapeutic intervention in order to improve the following deficits and impairments:  Decreased range of motion, Decreased scar mobility, Increased edema, Decreased knowledge of precautions, Decreased strength, Increased fascial restricitons, Impaired UE functional use, Postural dysfunction, Pain  Visit Diagnosis: Lymphedema, not elsewhere classified  Stiffness of right shoulder, not elsewhere classified  Abnormal posture  Muscle weakness (generalized)     Problem List Patient Active Problem List   Diagnosis Date Noted  . Port-A-Cath in place 07/11/2019  . Malignant neoplasm of upper-inner quadrant of right breast in female, estrogen receptor positive ([place]) 04/19/2019  . Facial weakness 11/07/2018  . 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 History of hysterectomy 10/12/2018  . History of bilateral salpingo-oophorectomy (BSO) 10/12/2018  . Osteoarthritis of right knee 05/12/2013  . HYPERLIPIDEMIA 12/30/2007  . HYPERTENSION 12/30/2007  . INSOMNIA 12/30/2007  . HYPERSOMNIA 12/30/2007  . DM w/o Complication Type II 37/35/7897  . HYPERCHOLESTEROLEMIA 12/16/2007  . EXOGENOUS OBESITY 12/16/2007  . SLEEP APNEA, OBSTRUCTIVE 12/16/2007  . ALLERGIC RHINITIS 12/16/2007    Otelia Limes, PTA 03/19/2020, 10:42 AM  [place], [name], [address] Phone: [phone]   Fax:  [phone]  Name: Kathy Reese MRN: [id number] Date of Birth: October 28, 1950

## 2020-03-20 NOTE — Progress Notes (Signed)
Pharmacist Chemotherapy Monitoring - Follow Up Assessment    I verify that I have reviewed each item in the below checklist:  . Regimen for the patient is scheduled for the appropriate day and plan matches scheduled date. Marland Kitchen Appropriate non-routine labs are ordered dependent on drug ordered. . If applicable, additional medications reviewed and ordered per protocol based on lifetime cumulative doses and/or treatment regimen.   Plan for follow-up and/or issues identified: Yes . I-vent associated with next due treatment: Yes . MD and/or nursing notified: No   Kennith Center, Pharm.D., CPP 03/20/2020@9 :40 AM

## 2020-03-21 ENCOUNTER — Ambulatory Visit: Payer: Medicare Other

## 2020-03-21 ENCOUNTER — Other Ambulatory Visit: Payer: Self-pay

## 2020-03-21 DIAGNOSIS — M25611 Stiffness of right shoulder, not elsewhere classified: Secondary | ICD-10-CM

## 2020-03-21 DIAGNOSIS — I890 Lymphedema, not elsewhere classified: Secondary | ICD-10-CM

## 2020-03-21 DIAGNOSIS — M6281 Muscle weakness (generalized): Secondary | ICD-10-CM

## 2020-03-21 DIAGNOSIS — R293 Abnormal posture: Secondary | ICD-10-CM

## 2020-03-21 DIAGNOSIS — M79621 Pain in right upper arm: Secondary | ICD-10-CM

## 2020-03-21 DIAGNOSIS — R262 Difficulty in walking, not elsewhere classified: Secondary | ICD-10-CM

## 2020-03-21 NOTE — Therapy (Signed)
[place], [name], [address] Phone: [phone]   Fax:  [phone]  Physical Therapy Treatment  Patient Details  Name: Kathy Reese MRN: [id number] Date of Birth: [birth year] Referring Provider (PT): Kinard   Encounter Date: 03/21/2020   PT End of Session - 03/21/20 1107    Visit Number 5    Number of Visits 9    Date for PT Re-Evaluation 04/04/20    PT Start Time 1104    PT Stop Time 1150    PT Time Calculation (min) 46 min    Activity Tolerance Patient tolerated treatment well    Behavior During Therapy [facility] for tasks assessed/performed           Past Medical History:  Diagnosis Date  . Allergic rhinitis, cause unspecified   . Arthritis   . Depression   . Diverticulosis   . History of Bell's palsy   . History of diabetes mellitus   . Hypersomnia   . OSA on CPAP    not using at this time 06/15/19  . Other and unspecified hyperlipidemia   . Personal history of malignant neoplasm of ovary   . Type II or unspecified type diabetes mellitus without mention of complication, not stated as uncontrolled   . Unspecified essential hypertension   . Vertigo     Past Surgical History:  Procedure Laterality Date  . ABDOMINAL ADHESION SURGERY  12/96   exc peritoneal cyst  . BREAST BIOPSY Left 2000   stereotactic, negative  . BREAST LUMPECTOMY WITH RADIOACTIVE SEED AND SENTINEL LYMPH NODE BIOPSY Right 05/30/2019   Procedure: RIGHT BREAST LUMPECTOMY WITH BRACKETED RADIOACTIVE SEEDS  AND SENTINEL LYMPH NODE BIOPSY;  Surgeon: Stark Klein, MD;  Location: [place];  Service: General;  Laterality: Right;  . BREAST REDUCTION SURGERY Bilateral 10/03  . EYE SURGERY Left    cataracts  . LAPAROSCOPIC SALPINGO OOPHERECTOMY Right 12/96  . 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 LAPAROSCOPY ABDOMEN DIAGNOSTIC  2/08   w/LOA, LSO  . PORTACATH PLACEMENT  Left 06/16/2019   Procedure: INSERTION PORT-A-CATH;  Surgeon: Stark Klein, MD;  Location: [place];  Service: General;  Laterality: Left;  . REDUCTION MAMMAPLASTY Bilateral 2004  . TONSILLECTOMY    . TOTAL ABDOMINAL HYSTERECTOMY W/ BILATERAL SALPINGOOPHORECTOMY  5/94   secondary to uterine fibroids  . TOTAL KNEE ARTHROPLASTY Right 05/12/2013   Procedure: TOTAL KNEE ARTHROPLASTY;  Surgeon: Alta Corning, MD;  Location: [place];  Service: Orthopedics;  Laterality: Right;  . TRIGGER FINGER RELEASE  07/06/2012   Procedure: RELEASE TRIGGER FINGER/A-1 PULLEY;  Surgeon: Alta Corning, MD;  Location: [place];  Service: Orthopedics;  Laterality: Left;  left ring finger  . TUBAL LIGATION      There were no vitals filed for this visit.   Subjective Assessment - 03/21/20 1108    Subjective Pt states that she got her new bras and she really likes them. She reports that she continues to feel the pull in her R breast with movement    Pertinent History R breast cancer, s/p R lumpectomy and SLNB on 05/30/19, 3 nodes removed all negative, currently undergoing chemo, pt will begin radiation after chemo    Patient Stated Goals be able to move my arm, soften the areas that are hard in the breast    Currently in Pain? No/denies    Pain Score 0-No pain  [place] Adult PT Treatment/Exercise - 03/21/20 0001      Shoulder Exercises: Pulleys   Flexion 2 minutes    ABduction 2 minutes      Shoulder Exercises: Therapy Ball   Flexion 10 reps;Both      Manual Therapy   Manual Therapy Manual Lymphatic Drainage (MLD);Passive ROM;Myofascial release;Soft tissue mobilization    Myofascial Release to R axilla in area of tightness and along the anterior portion of the R breast with flexion/abduction and horizontal rotation with fascia stabilized throughout P/ROM into motion    Manual Lymphatic Drainage (MLD) short neck, superficial and deep abdominals, bil axillary  nodes and establishment of interaxillary pathway, right inguinal nodes and establishment of axillo inguinal pathway, right breast moving fluid towards pathways; then reworked all surfaces and deep abdominals    Passive ROM into flexion/abduction and horizontal ABD 10x each direction                  PT Education - 03/21/20 1145    Education Details Pt will continue with wearing compression and performing HEP at home.    Person(s) Educated Patient    Methods Explanation    Comprehension Verbalized understanding               PT Long Term Goals - 03/07/20 0953      PT LONG TERM GOAL #1   Title Pt will demonstrate 155 degrees of R shoulder flexion to allow her to reach overhead.    Baseline R 126    Time 4    Period Weeks    Status New    Target Date 04/04/20      PT LONG TERM GOAL #2   Title Pt will demonstrates 160 degrees of R shoulder abduction to allow her to reach out to the side.    Baseline 114    Time 4    Period Weeks    Status New    Target Date 04/04/20      PT LONG TERM GOAL #3   Title Pt will be able to lift R arm above head without being limited by tightness and discomfort in R axilla.    Time 4    Period Weeks    Status New    Target Date 04/04/20      PT LONG TERM GOAL #4   Title Pt will be independent in a home exercise program for continued strengthening and stretching.    Time 4    Period Weeks    Status New    Target Date 04/04/20                 Plan - 03/21/20 1106    Clinical Impression Statement Continue with MLD of the R breast this session. Fascial tightness noted at the anterior superior R breast; with fascial stabilization into P/ROM in abduction, horizontal abduction and flexion. Softening of the R breast by end of session and pt reports less tightness at the R breast with AA/ROM activities preformed at end of session. Pt will benefit from continued POC at this time.    Personal Factors and Comorbidities Comorbidity  1;Comorbidity 2    Comorbidities diabetes, ovarian cancer,    Examination-Activity Limitations Reach Overhead;Carry;Lift    Examination-Participation Restrictions Cleaning;Meal Prep;Laundry;Yard Work    Dispensing optician    PT Frequency 2x / week    PT Duration 4 weeks    PT Treatment/Interventions ADLs/Self Care Home Management;Therapeutic activities;Therapeutic exercise;Patient/family education;Manual lymph drainage;Manual techniques;Compression bandaging;Scar mobilization;Passive  range of motion;Taping;Joint Manipulations    PT Next Visit Plan MLD to R breast, MFR to R axilla, ROM R shoulder, add pulleys and ball roll up wall    PT Home Exercise Plan supine dowel exercises, self MLD    Consulted and Agree with Plan of Care Patient           Patient will benefit from skilled therapeutic intervention in order to improve the following deficits and impairments:  Decreased range of motion, Decreased scar mobility, Increased edema, Decreased knowledge of precautions, Decreased strength, Increased fascial restricitons, Impaired UE functional use, Postural dysfunction, Pain  Visit Diagnosis: Lymphedema, not elsewhere classified  Stiffness of right shoulder, not elsewhere classified  Abnormal posture  Muscle weakness (generalized)  Difficulty in walking, not elsewhere classified  Pain in right upper arm     Problem List Patient Active Problem List   Diagnosis Date Noted  . Port-A-Cath in place 07/11/2019  . Malignant neoplasm of upper-inner quadrant of right breast in female, estrogen receptor positive ([place]) 04/19/2019  . Facial weakness 11/07/2018  . 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 History of hysterectomy 10/12/2018  . History of bilateral salpingo-oophorectomy (BSO) 10/12/2018  . Osteoarthritis of right knee 05/12/2013  . HYPERLIPIDEMIA 12/30/2007  . HYPERTENSION 12/30/2007  . INSOMNIA 12/30/2007  . HYPERSOMNIA 12/30/2007  .  DM w/o Complication Type II 09/40/7680  . HYPERCHOLESTEROLEMIA 12/16/2007  . EXOGENOUS OBESITY 12/16/2007  . SLEEP APNEA, OBSTRUCTIVE 12/16/2007  . ALLERGIC RHINITIS 12/16/2007    Ander Purpura, PT 03/21/2020, 11:58 AM  [place], [name], [address] Phone: [phone]   Fax:  [phone]  Name: Hien Cunliffe MRN: [id number] Date of Birth: Mar 06, 1951

## 2020-03-22 ENCOUNTER — Encounter: Payer: Medicare Other | Admitting: Physical Therapy

## 2020-03-25 ENCOUNTER — Other Ambulatory Visit: Payer: Self-pay

## 2020-03-25 ENCOUNTER — Ambulatory Visit: Payer: Medicare Other

## 2020-03-25 ENCOUNTER — Encounter: Payer: Self-pay | Admitting: *Deleted

## 2020-03-25 ENCOUNTER — Ambulatory Visit (HOSPITAL_COMMUNITY)
Admission: RE | Admit: 2020-03-25 | Discharge: 2020-03-25 | Disposition: A | Discharge status: Home / Self Care | Payer: Medicare Other | Source: Ambulatory Visit | Attending: Hematology and Oncology | Admitting: Hematology and Oncology

## 2020-03-25 DIAGNOSIS — Z170 Estrogen receptor positive status [ER+]: Secondary | ICD-10-CM | POA: Insufficient documentation

## 2020-03-25 DIAGNOSIS — I890 Lymphedema, not elsewhere classified: Secondary | ICD-10-CM | POA: Diagnosis not present

## 2020-03-25 DIAGNOSIS — Z0189 Encounter for other specified special examinations: Secondary | ICD-10-CM | POA: Diagnosis not present

## 2020-03-25 DIAGNOSIS — M6281 Muscle weakness (generalized): Secondary | ICD-10-CM

## 2020-03-25 DIAGNOSIS — E119 Type 2 diabetes mellitus without complications: Secondary | ICD-10-CM | POA: Diagnosis not present

## 2020-03-25 DIAGNOSIS — R293 Abnormal posture: Secondary | ICD-10-CM

## 2020-03-25 DIAGNOSIS — M25611 Stiffness of right shoulder, not elsewhere classified: Secondary | ICD-10-CM

## 2020-03-25 DIAGNOSIS — I10 Essential (primary) hypertension: Secondary | ICD-10-CM | POA: Diagnosis not present

## 2020-03-25 DIAGNOSIS — Z01818 Encounter for other preprocedural examination: Secondary | ICD-10-CM | POA: Diagnosis not present

## 2020-03-25 DIAGNOSIS — I340 Nonrheumatic mitral (valve) insufficiency: Secondary | ICD-10-CM | POA: Diagnosis not present

## 2020-03-25 DIAGNOSIS — C50211 Malignant neoplasm of upper-inner quadrant of right female breast: Secondary | ICD-10-CM

## 2020-03-25 DIAGNOSIS — E785 Hyperlipidemia, unspecified: Secondary | ICD-10-CM | POA: Diagnosis not present

## 2020-03-25 DIAGNOSIS — G510 Bell's palsy: Secondary | ICD-10-CM | POA: Insufficient documentation

## 2020-03-25 DIAGNOSIS — G4730 Sleep apnea, unspecified: Secondary | ICD-10-CM | POA: Diagnosis not present

## 2020-03-25 NOTE — Patient Instructions (Signed)

## 2020-03-25 NOTE — Progress Notes (Signed)
  Echocardiogram 2D Echocardiogram has been performed.  Kathy Reese 03/25/2020, 11:35 AM

## 2020-03-25 NOTE — Therapy (Signed)
[place], [name], [address] Phone: [phone]   Fax:  [phone]  Physical Therapy Treatment  Patient Details  Name: Kathy Reese MRN: [id number] Date of Birth: Jan 21, 1951 Referring Provider (PT): Kinard   Encounter Date: 03/25/2020   PT End of Session - 03/25/20 0820    Visit Number 6    Number of Visits 9    Date for PT Re-Evaluation 04/04/20    PT Start Time 0805    PT Stop Time 0901    PT Time Calculation (min) 56 min    Activity Tolerance Patient tolerated treatment well    Behavior During Therapy [facility] for tasks assessed/performed           Past Medical History:  Diagnosis Date  . Allergic rhinitis, cause unspecified   . Arthritis   . Depression   . Diverticulosis   . History of Bell's palsy   . History of diabetes mellitus   . Hypersomnia   . OSA on CPAP    not using at this time 06/15/19  . Other and unspecified hyperlipidemia   . Personal history of malignant neoplasm of ovary   . Type II or unspecified type diabetes mellitus without mention of complication, not stated as uncontrolled   . Unspecified essential hypertension   . Vertigo     Past Surgical History:  Procedure Laterality Date  . ABDOMINAL ADHESION SURGERY  12/96   exc peritoneal cyst  . BREAST BIOPSY Left 2000   stereotactic, negative  . BREAST LUMPECTOMY WITH RADIOACTIVE SEED AND SENTINEL LYMPH NODE BIOPSY Right 05/30/2019   Procedure: RIGHT BREAST LUMPECTOMY WITH BRACKETED RADIOACTIVE SEEDS  AND SENTINEL LYMPH NODE BIOPSY;  Surgeon: Stark Klein, MD;  Location: [place];  Service: General;  Laterality: Right;  . BREAST REDUCTION SURGERY Bilateral 10/03  . EYE SURGERY Left    cataracts  . LAPAROSCOPIC SALPINGO OOPHERECTOMY Right 12/96  . 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 LAPAROSCOPY ABDOMEN DIAGNOSTIC  2/08   w/LOA, LSO  . PORTACATH PLACEMENT  Left 06/16/2019   Procedure: INSERTION PORT-A-CATH;  Surgeon: Stark Klein, MD;  Location: [facility];  Service: General;  Laterality: Left;  . REDUCTION MAMMAPLASTY Bilateral 2004  . TONSILLECTOMY    . TOTAL ABDOMINAL HYSTERECTOMY W/ BILATERAL SALPINGOOPHORECTOMY  5/94   secondary to uterine fibroids  . TOTAL KNEE ARTHROPLASTY Right 05/12/2013   Procedure: TOTAL KNEE ARTHROPLASTY;  Surgeon: Alta Corning, MD;  Location: [place];  Service: Orthopedics;  Laterality: Right;  . TRIGGER FINGER RELEASE  07/06/2012   Procedure: RELEASE TRIGGER FINGER/A-1 PULLEY;  Surgeon: Alta Corning, MD;  Location: [facility];  Service: Orthopedics;  Laterality: Left;  left ring finger  . TUBAL LIGATION      There were no vitals filed for this visit.   Subjective Assessment - 03/25/20 0809    Subjective I can tell my Rt breast is doing better. It's alot softer than it was and isn't as heavy as it was. My compression bras are working real well also. Overall things are progressing well.    Pertinent History R breast cancer, s/p R lumpectomy and SLNB on 05/30/19, 3 nodes removed all negative, currently undergoing chemo, pt will begin radiation after chemo    Patient Stated Goals be able to move my arm, soften the areas that are hard in the breast    Currently in Pain? No/denies  [facility] Adult PT Treatment/Exercise - 03/25/20 0001      Shoulder Exercises: Supine   Horizontal ABduction Strengthening;Both;10 reps;Theraband;Limitations    Theraband Level (Shoulder Horizontal ABduction) Level 1 (Yellow)    Horizontal ABduction Limitations Pt returned demo of each of following supine exs.     External Rotation Strengthening;Both;10 reps;Theraband    Flexion Strengthening;Both;10 reps;Theraband   Narrow and Wide Grip, 10 times each   Theraband Level (Shoulder Flexion) Level 1 (Yellow)    Diagonals Strengthening;Right;Left;5 reps;Theraband    Theraband Level (Shoulder  Diagonals) Level 1 (Yellow)      Shoulder Exercises: Pulleys   Flexion 1 minute    ABduction 2 minutes      Manual Therapy   Manual Therapy Manual Lymphatic Drainage (MLD);Passive ROM;Myofascial release;Soft tissue mobilization    Myofascial Release to R axilla in area of tightness and along the anterior portion of the R breast with flexion/abduction and horizontal rotation with fascia stabilized throughout P/ROM into motion    Manual Lymphatic Drainage (MLD) short neck, superficial and deep abdominals, Lt axillary nodes and establishment of interaxillary pathway, right inguinal nodes and establishment of axillo inguinal pathway, right breast moving fluid towards pathways; then reworked all surfaces    Passive ROM into flexion/abduction, and D2 to pts tolerance                  PT Education - 03/25/20 0820    Education Details Supine scapular series with yellow theraband    Person(s) Educated Patient    Methods Explanation;Demonstration;Handout    Comprehension Verbalized understanding;Returned demonstration               PT Long Term Goals - 03/07/20 0953      PT LONG TERM GOAL #1   Title Pt will demonstrate 155 degrees of R shoulder flexion to allow her to reach overhead.    Baseline R 126    Time 4    Period Weeks    Status New    Target Date 04/04/20      PT LONG TERM GOAL #2   Title Pt will demonstrates 160 degrees of R shoulder abduction to allow her to reach out to the side.    Baseline 114    Time 4    Period Weeks    Status New    Target Date 04/04/20      PT LONG TERM GOAL #3   Title Pt will be able to lift R arm above head without being limited by tightness and discomfort in R axilla.    Time 4    Period Weeks    Status New    Target Date 04/04/20      PT LONG TERM GOAL #4   Title Pt will be independent in a home exercise program for continued strengthening and stretching.    Time 4    Period Weeks    Status New    Target Date 04/04/20                   Plan - 03/25/20 0821    Clinical Impression Statement Progressed pts HEP to include supine scapular series which she tolerated well just requiring minor VCs after demo for technique. Then continued with manual lymph drainage of Rt breast and MFR with P/ROM to Rt shoulder.    Personal Factors and Comorbidities Comorbidity 1;Comorbidity 2    Comorbidities diabetes, ovarian cancer,    Examination-Activity Limitations Reach Overhead;Carry;Lift    Examination-Participation Restrictions Cleaning;Meal Prep;Laundry;[employer]  Work    Stability/Clinical Decision Making Evolving/Moderate complexity    Rehab Potential Excellent    PT Frequency 2x / week    PT Duration 4 weeks    PT Treatment/Interventions ADLs/Self Care Home Management;Therapeutic activities;Therapeutic exercise;Patient/family education;Manual lymph drainage;Manual techniques;Compression bandaging;Scar mobilization;Passive range of motion;Taping;Joint Manipulations    PT Next Visit Plan MLD to R breast, MFR to R axilla, ROM R shoulder, add pulleys and ball roll up wall; review supine scapular series    PT Home Exercise Plan supine dowel exercises, self MLD; supine scapular series    Consulted and Agree with Plan of Care Patient           Patient will benefit from skilled therapeutic intervention in order to improve the following deficits and impairments:  Decreased range of motion, Decreased scar mobility, Increased edema, Decreased knowledge of precautions, Decreased strength, Increased fascial restricitons, Impaired UE functional use, Postural dysfunction, Pain  Visit Diagnosis: Lymphedema, not elsewhere classified  Stiffness of right shoulder, not elsewhere classified  Abnormal posture  Muscle weakness (generalized)     Problem List Patient Active Problem List   Diagnosis Date Noted  . Port-A-Cath in place 07/11/2019  . Malignant neoplasm of upper-inner quadrant of right breast in female, estrogen  receptor positive ([place]) 04/19/2019  . Facial weakness 11/07/2018  . 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 History of hysterectomy 10/12/2018  . History of bilateral salpingo-oophorectomy (BSO) 10/12/2018  . Osteoarthritis of right knee 05/12/2013  . HYPERLIPIDEMIA 12/30/2007  . HYPERTENSION 12/30/2007  . INSOMNIA 12/30/2007  . HYPERSOMNIA 12/30/2007  . DM w/o Complication Type II 41/93/7902  . HYPERCHOLESTEROLEMIA 12/16/2007  . EXOGENOUS OBESITY 12/16/2007  . SLEEP APNEA, OBSTRUCTIVE 12/16/2007  . ALLERGIC RHINITIS 12/16/2007    Otelia Limes, PTA 03/25/2020, 9:01 AM  [place], [name], [address] Phone: [phone]   Fax:  [phone]  Name: Alvia Jablonski MRN: [id number] Date of Birth: 09-08-1951

## 2020-03-26 ENCOUNTER — Inpatient Hospital Stay: Payer: Medicare Other

## 2020-03-26 ENCOUNTER — Encounter: Payer: Self-pay | Admitting: Hematology and Oncology

## 2020-03-26 ENCOUNTER — Encounter: Payer: Self-pay | Admitting: Adult Health

## 2020-03-26 ENCOUNTER — Inpatient Hospital Stay (HOSPITAL_BASED_OUTPATIENT_CLINIC_OR_DEPARTMENT_OTHER): Payer: Medicare Other | Admitting: Adult Health

## 2020-03-26 ENCOUNTER — Other Ambulatory Visit: Payer: Self-pay

## 2020-03-26 VITALS — BP 168/48 | HR 55 | Temp 98.2°F | Resp 20 | Ht 63.0 in | Wt 209.9 lb

## 2020-03-26 DIAGNOSIS — C50211 Malignant neoplasm of upper-inner quadrant of right female breast: Secondary | ICD-10-CM | POA: Diagnosis not present

## 2020-03-26 DIAGNOSIS — Z5112 Encounter for antineoplastic immunotherapy: Secondary | ICD-10-CM | POA: Diagnosis not present

## 2020-03-26 DIAGNOSIS — Z170 Estrogen receptor positive status [ER+]: Secondary | ICD-10-CM

## 2020-03-26 DIAGNOSIS — T451X5A Adverse effect of antineoplastic and immunosuppressive drugs, initial encounter: Secondary | ICD-10-CM | POA: Diagnosis not present

## 2020-03-26 DIAGNOSIS — Z95828 Presence of other vascular implants and grafts: Secondary | ICD-10-CM

## 2020-03-26 DIAGNOSIS — G620 Drug-induced polyneuropathy: Secondary | ICD-10-CM | POA: Insufficient documentation

## 2020-03-26 LAB — CBC WITH DIFFERENTIAL (CANCER CENTER ONLY)
Abs Immature Granulocytes: 0.01 10*3/uL (ref 0.00–0.07)
Basophils Absolute: 0 10*3/uL (ref 0.0–0.1)
Basophils Relative: 1 %
Eosinophils Absolute: 0.2 10*3/uL (ref 0.0–0.5)
Eosinophils Relative: 3 %
HCT: 33.3 % — ABNORMAL LOW (ref 36.0–46.0)
Hemoglobin: 10.8 g/dL — ABNORMAL LOW (ref 12.0–15.0)
Immature Granulocytes: 0 %
Lymphocytes Relative: 24 %
Lymphs Abs: 1.5 10*3/uL (ref 0.7–4.0)
MCH: 29 pg (ref 26.0–34.0)
MCHC: 32.4 g/dL (ref 30.0–36.0)
MCV: 89.5 fL (ref 80.0–100.0)
Monocytes Absolute: 0.6 10*3/uL (ref 0.1–1.0)
Monocytes Relative: 10 %
Neutro Abs: 3.9 10*3/uL (ref 1.7–7.7)
Neutrophils Relative %: 62 %
Platelet Count: 162 10*3/uL (ref 150–400)
RBC: 3.72 MIL/uL — ABNORMAL LOW (ref 3.87–5.11)
RDW: 14 % (ref 11.5–15.5)
WBC Count: 6.2 10*3/uL (ref 4.0–10.5)
nRBC: 0 % (ref 0.0–0.2)

## 2020-03-26 LAB — CMP (CANCER CENTER ONLY)
ALT: 25 U/L (ref 0–44)
AST: 21 U/L (ref 15–41)
Albumin: 3.7 g/dL (ref 3.5–5.0)
Alkaline Phosphatase: 116 U/L (ref 38–126)
Anion gap: 8 (ref 5–15)
BUN: 19 mg/dL (ref 8–23)
CO2: 26 mmol/L (ref 22–32)
Calcium: 9.2 mg/dL (ref 8.9–10.3)
Chloride: 106 mmol/L (ref 98–111)
Creatinine: 0.91 mg/dL (ref 0.44–1.00)
GFR, Est AFR Am: >60 mL/min (ref >60)
GFR, Estimated: >60 mL/min (ref >60)
Glucose, Bld: 136 mg/dL — ABNORMAL HIGH (ref 70–99)
Potassium: 4.2 mmol/L (ref 3.5–5.1)
Sodium: 140 mmol/L (ref 135–145)
Total Bilirubin: 1 mg/dL (ref 0.3–1.2)
Total Protein: 6.8 g/dL (ref 6.5–8.1)

## 2020-03-26 MED ORDER — SODIUM CHLORIDE 0.9% FLUSH
10.0000 mL | Freq: Once | INTRAVENOUS | Status: AC
  Administered 2020-03-26: 10 mL
  Filled 2020-03-26: qty 10

## 2020-03-26 MED ORDER — DIPHENHYDRAMINE HCL 25 MG PO CAPS
ORAL_CAPSULE | ORAL | Status: AC
  Filled 2020-03-26: qty 1

## 2020-03-26 MED ORDER — TRASTUZUMAB-ANNS CHEMO 150 MG IV SOLR
600.0000 mg | Freq: Once | INTRAVENOUS | Status: AC
  Administered 2020-03-26: 600 mg via INTRAVENOUS
  Filled 2020-03-26: qty 28.57

## 2020-03-26 MED ORDER — HEPARIN SOD (PORK) LOCK FLUSH 100 UNIT/ML IV SOLN
500.0000 [IU] | Freq: Once | INTRAVENOUS | Status: AC | PRN
  Administered 2020-03-26: 500 [IU]
  Filled 2020-03-26: qty 5

## 2020-03-26 MED ORDER — DIPHENHYDRAMINE HCL 25 MG PO CAPS
50.0000 mg | ORAL_CAPSULE | Freq: Once | ORAL | Status: AC
  Administered 2020-03-26: 50 mg via ORAL

## 2020-03-26 MED ORDER — SODIUM CHLORIDE 0.9 % IV SOLN
Freq: Once | INTRAVENOUS | Status: AC
  Filled 2020-03-26: qty 250

## 2020-03-26 MED ORDER — ACETAMINOPHEN 325 MG PO TABS
650.0000 mg | ORAL_TABLET | Freq: Once | ORAL | Status: AC
  Administered 2020-03-26: 650 mg via ORAL

## 2020-03-26 MED ORDER — SODIUM CHLORIDE 0.9% FLUSH
10.0000 mL | INTRAVENOUS | Status: DC | PRN
  Administered 2020-03-26: 10 mL
  Filled 2020-03-26: qty 10

## 2020-03-26 MED ORDER — ACETAMINOPHEN 325 MG PO TABS
ORAL_TABLET | ORAL | Status: AC
  Filled 2020-03-26: qty 2

## 2020-03-26 NOTE — Assessment & Plan Note (Addendum)
04/19/2019:Screening mammogram detected right breast asymmetry. Diagnostic mammogram and US showed 2 indeterminate right breast masses, 34m at 1 o'clock, 759mat 12 o'clock, with no axillary adenopathy. 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 Stage Ia  05/30/2019: Right lumpectomy:[facility], grade 3, 0.6cm, clear margins, 3 axillary lymph nodes negative.HER-2 positive (3+), ER+ (80%), PR -, Ki67 40%. Stage Ia  Treatment plan: 1.Adjuvant chemo with Taxol Herceptinx9 cycles(stopped for neuropathy)06/27/2019-08/22/2019 2. Adjuvant radiation therapy12/22/2020- 10/27/19 3. Adjuvant antiestrogen therapy with anastrozole started 11/21/2019 ------------------------------------------------------------------------------------------------------------------------------------------------------- Current treatment:Herceptin maintenance therapy  with anastrozole 12/21/2019: Echocardiogram EF 60 to 65% 03/25/2020: Echocardiogram EF 60-65%   Kathy Reese doing well today.  She has no clinical or radiographic sign of breast cancer recurrence.  She continues on Herceptin every 3 weeks and Anastrozole daily with good tolerance.    She is set up for her mammogram and bone density evaluation on 03/29/2020.  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  Since she also has neuropathy and the medication prescribed by her PCP isn't helping, I asked for PT to talk to her at her next appt about PT for neuropathy.  She will also get me the name of the medication she was prescribed.  She could just need a dosage adjustment.    Her labs remain stable.  She will return in 3 weeks for Herceptin, and 6 weeks for labs, f/u with Kathy Reese Adiend Herceptin.  We moved the date of her final Herceptin to 05/29/2020, and we will do her SCP visit prior to her final treatment.    She knows to call for any questions that may arise between now and her next  appointment.  We are happy to see her sooner if needed.

## 2020-03-26 NOTE — Progress Notes (Signed)
[facility] Follow up:    Nolene Ebbs, MD [address] [place] [address]   DIAGNOSIS: Cancer Staging Malignant neoplasm of upper-inner quadrant of right breast in female, estrogen receptor positive ([place]) Staging form: Breast, AJCC 8th Edition - Clinical stage from 04/12/2019: Stage IA (cT1b, cN0, cM0, G3, ER+, PR-, HER2+) - Signed by Gardenia Phlegm, NP on 04/19/2019 - Pathologic: Stage IA (pT1b, pN0, cM0, G3, ER+, PR-, HER2+) - Signed by Nicholas Lose, MD on 06/06/2019   SUMMARY OF ONCOLOGIC HISTORY: Oncology History  Malignant neoplasm of upper-inner quadrant of right breast in female, estrogen receptor positive ([place])  04/12/2019 Cancer Staging   Staging form: Breast, AJCC 8th Edition - Clinical stage from 04/12/2019: Stage IA (cT1b, cN0, cM0, G3, ER+, PR-, HER2+) - Signed by Gardenia Phlegm, NP on 04/19/2019   04/19/2019 Initial Diagnosis   Screening mammogram detected right breast asymmetry. Diagnostic mammogram and US showed 2 indeterminate right breast masses, 52m at 1 o'clock, 775mat 12 o'clock, with no axillary adenopathy. 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    05/30/2019 Surgery   Right lumpectomy (BBarry Dienes IDC with DCIS, grade 3, 0.6cm, clear margins, 3 axillary lymph nodes negative.    06/06/2019 Cancer Staging   Staging form: Breast, AJCC 8th Edition - Pathologic: Stage IA (pT1b, pN0, cM0, G3, ER+, PR-, HER2+) - Signed by GuNicholas LoseMD on 06/06/2019   06/27/2019 -  Chemotherapy   The patient had PACLitaxel (TAXOL) 174 mg in sodium chloride 0.9 % 250 mL chemo infusion (</= 8015m2), 80 mg/m2 = 174 mg, Intravenous,  Once, 3 of 3 cycles Dose modification: 65 mg/m2 (original dose 80 mg/m2, Cycle 3, Reason: Dose not tolerated) Administration: 174 mg (06/27/2019), 174 mg (07/04/2019), 174 mg (07/25/2019), 174 mg (07/11/2019), 174 mg (07/18/2019), 174 mg (08/01/2019), 174 mg (08/08/2019), 174 mg  (08/15/2019), 138 mg (08/22/2019) trastuzumab-anns (KANJINTI) 399 mg in sodium chloride 0.9 % 250 mL chemo infusion, 4 mg/kg = 399 mg (100 % of original dose 4 mg/kg), Intravenous,  Once, 12 of 17 cycles Dose modification: 4 mg/kg (original dose 4 mg/kg, Cycle 1, Reason: Other (see comments), Comment: Biosimilar Conversion; [facility] (original dose 2 mg/kg, Cycle 3, Reason: Provider Judgment), 6 mg/kg (original dose 2 mg/kg, Cycle 4, Reason: Other (see comments), Comment: switch to maintenance q3 weeks), 600 mg (original dose 2 mg/kg, Cycle 4, Reason: Other (see comments), Comment: maint q3 weeks), 525 mg (original dose 2 mg/kg, Cycle 11, Reason: Other (see comments), Comment: Weight loss) Administration: 399 mg (06/27/2019), 189 mg (07/04/2019), 189 mg (07/11/2019), 189 mg (07/18/2019), 189 mg (07/25/2019), 189 mg (08/01/2019), 189 mg (08/08/2019), 189 mg (08/15/2019), 189 mg (08/22/2019), 600 mg (08/29/2019), 600 mg (09/19/2019), 600 mg (10/10/2019), 600 mg (10/31/2019), 600 mg (11/21/2019), 600 mg (12/12/2019), 600 mg (01/02/2020), 525 mg (01/23/2020), 525 mg (02/13/2020), 600 mg (03/05/2020)  for chemotherapy treatment.    09/26/2019 - 10/27/2019 Radiation Therapy   Adjuvant radiation therapy   11/21/2019 -  Anti-estrogen oral therapy   Anastrozole, plan 7 years     CURRENT THERAPY: Anastrozole daily, Herceptin every 3 weeks  INTERVAL HISTORY: Kimora TisSilvanna Ohmer 41o. female returns for evaluation prior to receiving her next dose of herceptin therapy.  She continues on Anastrozole and does tolerate it moderately well.  She does experience some hot flashes that are manageable.    She is not having diarrhea.  She has continued neuropathy in her  fingertips and toes.  She was prescribed a medication by her PCP she cannot recall the name off. She says it is not working.    Her most recent echocardiogram was completed on 03/25/2020 and showed an EF of 60-65%.  She is scheduled for her mammogram and  bone density on 03/29/2020.    She is wearing compression bras for breasts.     Patient Active Problem List   Diagnosis Date Noted  . Chemotherapy-induced peripheral neuropathy ([place]) 03/26/2020  . Port-A-Cath in place 07/11/2019  . Malignant neoplasm of upper-inner quadrant of right breast in female, estrogen receptor positive ([place]) 04/19/2019  . Facial weakness 11/07/2018  . 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 History of hysterectomy 10/12/2018  . History of bilateral salpingo-oophorectomy (BSO) 10/12/2018  . Osteoarthritis of right knee 05/12/2013  . HYPERLIPIDEMIA 12/30/2007  . HYPERTENSION 12/30/2007  . INSOMNIA 12/30/2007  . HYPERSOMNIA 12/30/2007  . DM w/o Complication Type II 44/31/5400  . HYPERCHOLESTEROLEMIA 12/16/2007  . EXOGENOUS OBESITY 12/16/2007  . SLEEP APNEA, OBSTRUCTIVE 12/16/2007  . ALLERGIC RHINITIS 12/16/2007    has No Known Allergies.  MEDICAL HISTORY: Past Medical History:  Diagnosis Date  . Allergic rhinitis, cause unspecified   . Arthritis   . Depression   . Diverticulosis   . History of Bell's palsy   . History of diabetes mellitus   . Hypersomnia   . OSA on CPAP    not using at this time 06/15/19  . Other and unspecified hyperlipidemia   . Personal history of malignant neoplasm of ovary   . Type II or unspecified type diabetes mellitus without mention of complication, not stated as uncontrolled   . Unspecified essential hypertension   . Vertigo     SURGICAL HISTORY: Past Surgical History:  Procedure Laterality Date  . ABDOMINAL ADHESION SURGERY  12/96   exc peritoneal cyst  . BREAST BIOPSY Left 2000   stereotactic, negative  . BREAST LUMPECTOMY WITH RADIOACTIVE SEED AND SENTINEL LYMPH NODE BIOPSY Right 05/30/2019   Procedure: RIGHT BREAST LUMPECTOMY WITH BRACKETED RADIOACTIVE SEEDS  AND SENTINEL LYMPH NODE BIOPSY;  Surgeon: Stark Klein, MD;  Location: [facility];  Service: General;  Laterality: Right;  .  BREAST REDUCTION SURGERY Bilateral 10/03  . EYE SURGERY Left    cataracts  . LAPAROSCOPIC SALPINGO OOPHERECTOMY Right 12/96  . 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 LAPAROSCOPY ABDOMEN DIAGNOSTIC  2/08   w/LOA, LSO  . PORTACATH PLACEMENT Left 06/16/2019   Procedure: INSERTION PORT-A-CATH;  Surgeon: Stark Klein, MD;  Location: [facility];  Service: General;  Laterality: Left;  . REDUCTION MAMMAPLASTY Bilateral 2004  . TONSILLECTOMY    . TOTAL ABDOMINAL HYSTERECTOMY W/ BILATERAL SALPINGOOPHORECTOMY  5/94   secondary to uterine fibroids  . TOTAL KNEE ARTHROPLASTY Right 05/12/2013   Procedure: TOTAL KNEE ARTHROPLASTY;  Surgeon: Alta Corning, MD;  Location: [facility];  Service: Orthopedics;  Laterality: Right;  . TRIGGER FINGER RELEASE  07/06/2012   Procedure: RELEASE TRIGGER FINGER/A-1 PULLEY;  Surgeon: Alta Corning, MD;  Location: [facility];  Service: Orthopedics;  Laterality: Left;  left ring finger  . TUBAL LIGATION      SOCIAL HISTORY: Social History   Socioeconomic History  . Marital status: Married    Spouse name: Not on file  . Number of children: 3  . Years of education: Not on file  . Highest education level: Not  on file  Occupational History  . Occupation: Fish farm manager REP    Employer: [employer]  Tobacco Use  . Smoking status: Never Smoker  . Smokeless tobacco: Never Used  Vaping Use  . Vaping Use: Never used  Substance and Sexual Activity  . Alcohol use: Yes    Alcohol/week: 1.0 standard drink    Types: 1 Glasses of wine per week    Comment: occasionally  . Drug use: No  . Sexual activity: Not Currently    Partners: Male    Birth control/protection: Abstinence  Other Topics Concern  . Not on file  Social History Narrative  . Not on file   Social Determinants of Health   Financial Resource Strain:   . Difficulty of Paying Living Expenses:   Food Insecurity:   . Worried About Ship broker in the Last Year:   . Arboriculturist in the Last Year:   Transportation Needs:   . Film/video editor (Medical):   Marland Kitchen Lack of Transportation (Non-Medical):   Physical Activity:   . Days of Exercise per Week:   . Minutes of Exercise per Session:   Stress:   . Feeling of Stress :   Social Connections:   . Frequency of Communication with Friends and Family:   . Frequency of Social Gatherings with Friends and Family:   . Attends Religious Services:   . Active Member of Clubs or Organizations:   . Attends Archivist Meetings:   Marland Kitchen Marital Status:   Intimate Partner Violence:   . Fear of Current or Ex-Partner:   . Emotionally Abused:   Marland Kitchen Physically Abused:   . Sexually Abused:     FAMILY HISTORY: Family History  Problem Relation Age of Onset  . Lung cancer Mother   . Colon cancer Father   . Stroke Brother   . Stroke Sister   . Pulmonary Hypertension Sister     Review of Systems  Constitutional: Positive for fatigue. Negative for appetite change, chills, fever and unexpected weight change.  HENT:   Negative for hearing loss and lump/mass.   Eyes: Negative for eye problems and icterus.  Respiratory: Negative for chest tightness, cough, shortness of breath and wheezing.   Cardiovascular: Negative for chest pain, leg swelling and palpitations.  Gastrointestinal: Negative for abdominal distention, abdominal pain, constipation, diarrhea, nausea and vomiting.  Endocrine: Negative for hot flashes.  Genitourinary: Negative for difficulty urinating.   Musculoskeletal: Negative for arthralgias.  Skin: Negative for itching and rash.  Neurological: Positive for numbness. Negative for dizziness, extremity weakness and headaches.  Hematological: Negative for adenopathy. Does not bruise/bleed easily.  Psychiatric/Behavioral: Negative for depression and suicidal ideas. The patient is not nervous/anxious.       PHYSICAL EXAMINATION  ECOG PERFORMANCE STATUS: 1 -  Symptomatic but completely ambulatory  Vitals:   03/26/20 0952  BP: (!) 168/48  Pulse: (!) 55  Resp: 20  Temp: 98.2 F (36.8 C)  SpO2: 97%    Physical Exam Constitutional:      General: She is not in acute distress.    Appearance: Normal appearance.  HENT:     Head: Normocephalic and atraumatic.  Eyes:     General: No scleral icterus. Cardiovascular:     Rate and Rhythm: Normal rate and regular rhythm.     Pulses: Normal pulses.     Heart sounds: Normal heart sounds.  Pulmonary:     Effort: Pulmonary effort is normal.  Breath sounds: Normal breath sounds.  Abdominal:     General: Abdomen is flat. Bowel sounds are normal. There is no distension.     Palpations: Abdomen is soft.     Tenderness: There is no abdominal tenderness.  Musculoskeletal:        General: No swelling.     Cervical back: Neck supple.  Lymphadenopathy:     Cervical: No cervical adenopathy.  Skin:    General: Skin is warm and dry.     Capillary Refill: Capillary refill takes less than 2 seconds.     Findings: No rash.  Neurological:     General: No focal deficit present.     Mental Status: She is alert.  Psychiatric:        Mood and Affect: Mood normal.        Behavior: Behavior normal.     LABORATORY DATA:  CBC    Component Value Date/Time   WBC 6.2 03/26/2020 0903   WBC 6.5 05/25/2019 0824   RBC 3.72 (L) 03/26/2020 0903   HGB 10.8 (L) 03/26/2020 0903   HGB 13.1 06/24/2017 1511   HCT 33.3 (L) 03/26/2020 0903   HCT 39.1 06/24/2017 1511   PLT 162 03/26/2020 0903   PLT 205 06/24/2017 1511   MCV 89.5 03/26/2020 0903   MCV 87 06/24/2017 1511   MCH 29.0 03/26/2020 0903   MCHC 32.4 03/26/2020 0903   RDW 14.0 03/26/2020 0903   RDW 14.7 06/24/2017 1511   LYMPHSABS 1.5 03/26/2020 0903   LYMPHSABS 3.0 06/24/2017 1511   MONOABS 0.6 03/26/2020 0903   EOSABS 0.2 03/26/2020 0903   EOSABS 0.2 06/24/2017 1511   BASOSABS 0.0 03/26/2020 0903   BASOSABS 0.0 06/24/2017 1511    CMP      Component Value Date/Time   NA 140 03/26/2020 0903   K 4.2 03/26/2020 0903   CL 106 03/26/2020 0903   CO2 26 03/26/2020 0903   GLUCOSE 136 (H) 03/26/2020 0903   BUN 19 03/26/2020 0903   CREATININE 0.91 03/26/2020 0903   CALCIUM 9.2 03/26/2020 0903   PROT 6.8 03/26/2020 0903   ALBUMIN 3.7 03/26/2020 0903   AST 21 03/26/2020 0903   ALT 25 03/26/2020 0903   ALKPHOS 116 03/26/2020 0903   BILITOT 1.0 03/26/2020 0903   GFRNONAA >60 03/26/2020 0903   GFRAA >60 03/26/2020 0903          RADIOGRAPHIC STUDIES:  ECHOCARDIOGRAM COMPLETE  Result Date: 03/25/2020    ECHOCARDIOGRAM REPORT   Patient Name:   LOANNE EMERY Date of Exam: 03/25/2020 Medical Rec #:  [id number]           Height:       63.0 in Accession #:    [id number]          Weight:       206.2 lb Date of Birth:  1951-09-30          BSA:          1.959 m Patient Age:    69 years            BP:           176/87 mmHg Patient Gender: F                   HR:           49 bpm. Exam Location:  Outpatient Procedure: 2D Echo, 3D Echo, Cardiac Doppler, Color Doppler and Strain Analysis Indications:    Z51.11  Encounter for antineoplastic chemotheraphy  History:        Patient has prior history of Echocardiogram examinations, most                 recent 12/21/2019. Risk Factors:Hypertension, Diabetes,                 Dyslipidemia and Sleep Apnea. Bell's Palsy.  Sonographer:    Tiffany Dance Referring Phys: [id number] Nicholas Lose IMPRESSIONS  1. Left ventricular ejection fraction, by estimation, is 60 to 65%. The left ventricle has normal function. The left ventricle has no regional wall motion abnormalities. There is mild left ventricular hypertrophy. Left ventricular diastolic parameters were normal. Elevated left ventricular end-diastolic pressure. The average left ventricular global longitudinal strain is -19.1 %. The global longitudinal strain is normal.  2. Right ventricular systolic function is normal. The right ventricular size is normal.  Tricuspid regurgitation signal is inadequate for assessing PA pressure.  3. The mitral valve is degenerative. Mild to moderate mitral valve regurgitation. No evidence of mitral stenosis.  4. The aortic valve is tricuspid. Aortic valve regurgitation is not visualized. No aortic stenosis is present.  5. The inferior vena cava is normal in size with greater than 50% respiratory variability, suggesting right atrial pressure of 3 mmHg. FINDINGS  Left Ventricle: Left ventricular ejection fraction, by estimation, is 60 to 65%. The left ventricle has normal function. The left ventricle has no regional wall motion abnormalities. The average left ventricular global longitudinal strain is -19.1 %. The global longitudinal strain is normal. The left ventricular internal cavity size was normal in size. There is mild left ventricular hypertrophy. Left ventricular diastolic parameters were normal. Elevated left ventricular end-diastolic pressure. Right Ventricle: The right ventricular size is normal. No increase in right ventricular wall thickness. Right ventricular systolic function is normal. Tricuspid regurgitation signal is inadequate for assessing PA pressure. Left Atrium: Left atrial size was normal in size. Right Atrium: Right atrial size was normal in size. Pericardium: There is no evidence of pericardial effusion. Mitral Valve: The mitral valve is degenerative in appearance. Normal mobility of the mitral valve leaflets. Mild mitral annular calcification. Mild to moderate mitral valve regurgitation. No evidence of mitral valve stenosis. Tricuspid Valve: The tricuspid valve is normal in structure. Tricuspid valve regurgitation is not demonstrated. No evidence of tricuspid stenosis. Aortic Valve: The aortic valve is tricuspid. Aortic valve regurgitation is not visualized. No aortic stenosis is present. Pulmonic Valve: The pulmonic valve was grossly normal. Pulmonic valve regurgitation is trivial. No evidence of pulmonic  stenosis. Aorta: The aortic root is normal in size and structure. Venous: The inferior vena cava is normal in size with greater than 50% respiratory variability, suggesting right atrial pressure of 3 mmHg. IAS/Shunts: No atrial level shunt detected by color flow Doppler.  LEFT VENTRICLE PLAX 2D LVIDd:         4.50 cm Diastology LVIDs:         2.30 cm LV e' lateral:   10.60 cm/s LV PW:         1.00 cm LV E/e' lateral: 10.4 LV IVS:        1.20 cm LV e' medial:    6.09 cm/s                        LV E/e' medial:  18.1  2D Longitudinal Strain                        2D Strain GLS Avg:     -19.1 %                         3D Volume EF:                        3D EF:        64 %                        LV EDV:       155 ml                        LV ESV:       55 ml                        LV SV:        100 ml RIGHT VENTRICLE             IVC RV Basal diam:  2.80 cm     IVC diam: 2.00 cm RV S prime:     11.00 cm/s TAPSE (M-mode): 2.3 cm LEFT ATRIUM             Index       RIGHT ATRIUM           Index LA diam:        4.30 cm 2.19 cm/m  RA Area:     14.60 cm LA Vol (A2C):   50.3 ml 25.68 ml/m RA Volume:   33.60 ml  17.15 ml/m LA Vol (A4C):   37.5 ml 19.14 ml/m LA Biplane Vol: 43.8 ml 22.36 ml/m  AORTIC VALVE LVOT Vmax:   92.80 cm/s LVOT Vmean:  60.900 cm/s LVOT VTI:    0.265 m  AORTA Ao Asc diam: 3.30 cm MITRAL VALVE MV Area (PHT): 3.99 cm     SHUNTS MV Decel Time: 190 msec     Systemic VTI: 0.26 m MV E velocity: 110.00 cm/s MV A velocity: 58.90 cm/s MV E/A ratio:  1.87 Cherlynn Kaiser MD Electronically signed by Cherlynn Kaiser MD Signature Date/Time: 03/25/2020/7:49:34 PM    Final          ASSESSMENT and THERAPY PLAN:   Malignant neoplasm of upper-inner quadrant of right breast in female, estrogen receptor positive ([place]) 04/19/2019:Screening mammogram detected right breast asymmetry. Diagnostic mammogram and US showed 2 indeterminate right breast masses, 75m at 1 o'clock, 743mat 12 o'clock,  with no axillary adenopathy. 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 Stage Ia  05/30/2019: Right lumpectomy:[facility] DCIS, grade 3, 0.6cm, clear margins, 3 axillary lymph nodes negative.HER-2 positive (3+), ER+ (80%), PR -, Ki67 40%. Stage Ia  Treatment plan: 1.Adjuvant chemo with Taxol Herceptinx9 cycles(stopped for neuropathy)06/27/2019-08/22/2019 2. Adjuvant radiation therapy12/22/2020- 10/27/19 3. Adjuvant antiestrogen therapy with anastrozole started 11/21/2019 ------------------------------------------------------------------------------------------------------------------------------------------------------- Current treatment:Herceptin maintenance therapy  with anastrozole 12/21/2019: Echocardiogram EF 60 to 65% 03/25/2020: Echocardiogram EF 60-65%   JeNames doing well today.  She has no clinical or radiographic sign of breast cancer recurrence.  She continues on Herceptin every 3 weeks and Anastrozole daily with good tolerance.    She is set up for  her mammogram and bone density evaluation on 03/29/2020.  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  Since she also has neuropathy and the medication prescribed by her PCP isn't helping, I asked for PT to talk to her at her next appt about PT for neuropathy.  She will also get me the name of the medication she was prescribed.  She could just need a dosage adjustment.    Her labs remain stable.  She will return in 3 weeks for Herceptin, and 6 weeks for labs, f/u with Dr. Lindi Adie and Herceptin.  We moved the date of her final Herceptin to 05/29/2020, and we will do her SCP visit prior to her final treatment.    She knows to call for any questions that may arise between now and her next appointment.  We are happy to see her sooner if needed.   Total encounter time: 30 minutes*  Wilber Bihari, NP 03/26/20 10:21 AM Medical Oncology and Hematology [facility] [place], [place] [address] Tel. [phone]    Fax. [phone]  *Total Encounter Time as defined by the Centers for Medicare and Medicaid Services includes, in addition to the face-to-face time of a patient visit (documented in the note above) non-face-to-face time: obtaining and reviewing outside history, ordering and reviewing medications, tests or procedures, care coordination (communications with other health care professionals or caregivers) and documentation in the medical record.

## 2020-03-26 NOTE — Patient Instructions (Signed)
[facility] Discharge Instructions for Patients Receiving Chemotherapy  Today you received the following chemotherapy agents: trastuzumab-anns  To help prevent nausea and vomiting after your treatment, we encourage you to take your nausea medication as directed.   If you develop nausea and vomiting that is not controlled by your nausea medication, call the clinic.   BELOW ARE SYMPTOMS THAT SHOULD BE REPORTED IMMEDIATELY:  *FEVER GREATER THAN 100.5 F  *CHILLS WITH OR WITHOUT FEVER  NAUSEA AND VOMITING THAT IS NOT CONTROLLED WITH YOUR NAUSEA MEDICATION  *UNUSUAL SHORTNESS OF BREATH  *UNUSUAL BRUISING OR BLEEDING  TENDERNESS IN MOUTH AND THROAT WITH OR WITHOUT PRESENCE OF ULCERS  *URINARY PROBLEMS  *BOWEL PROBLEMS  UNUSUAL RASH Items with * indicate a potential emergency and should be followed up as soon as possible.  Feel free to call the clinic should you have any questions or concerns. The clinic phone number is [phone].  Please show the CHEMO ALERT CARD at check-in to the Emergency Department and triage nurse.   

## 2020-03-26 NOTE — Progress Notes (Signed)
Pt is approved for the $1000 [employer] grant.  

## 2020-03-27 ENCOUNTER — Telehealth: Payer: Self-pay | Admitting: Adult Health

## 2020-03-27 NOTE — Telephone Encounter (Signed)
Cancelled and scheduled appts per 6/22 los. Pt confirmed appt date and time.

## 2020-03-29 ENCOUNTER — Ambulatory Visit
Admission: RE | Admit: 2020-03-29 | Discharge: 2020-03-29 | Disposition: A | Discharge status: Home / Self Care | Payer: Medicare Other | Source: Ambulatory Visit | Attending: Adult Health | Admitting: Adult Health

## 2020-03-29 ENCOUNTER — Other Ambulatory Visit: Payer: Self-pay

## 2020-03-29 ENCOUNTER — Encounter: Payer: Self-pay | Admitting: Rehabilitation

## 2020-03-29 ENCOUNTER — Ambulatory Visit: Payer: Medicare Other | Admitting: Rehabilitation

## 2020-03-29 DIAGNOSIS — R293 Abnormal posture: Secondary | ICD-10-CM

## 2020-03-29 DIAGNOSIS — I890 Lymphedema, not elsewhere classified: Secondary | ICD-10-CM | POA: Diagnosis not present

## 2020-03-29 DIAGNOSIS — M25611 Stiffness of right shoulder, not elsewhere classified: Secondary | ICD-10-CM

## 2020-03-29 DIAGNOSIS — E2839 Other primary ovarian failure: Secondary | ICD-10-CM

## 2020-03-29 DIAGNOSIS — M79621 Pain in right upper arm: Secondary | ICD-10-CM

## 2020-03-29 DIAGNOSIS — R262 Difficulty in walking, not elsewhere classified: Secondary | ICD-10-CM

## 2020-03-29 DIAGNOSIS — M6281 Muscle weakness (generalized): Secondary | ICD-10-CM

## 2020-03-29 DIAGNOSIS — C50211 Malignant neoplasm of upper-inner quadrant of right female breast: Secondary | ICD-10-CM

## 2020-03-29 HISTORY — DX: Personal history of antineoplastic chemotherapy: Z92.21

## 2020-03-29 HISTORY — DX: Personal history of irradiation: Z92.3

## 2020-03-29 NOTE — Therapy (Signed)
[place], [name], [address] Phone: [phone]   Fax:  [phone]  Physical Therapy Treatment  Patient Details  Name: Kathy Reese MRN: [id number] Date of Birth: [birth year] Referring Provider (PT): Kinard   Encounter Date: 03/29/2020   PT End of Session - 03/29/20 0858    Visit Number 7    Number of Visits 9    Date for PT Re-Evaluation 04/04/20    PT Start Time 0802    PT Stop Time 1751    PT Time Calculation (min) 55 min    Activity Tolerance Patient tolerated treatment well    Behavior During Therapy [facility] for tasks assessed/performed           Past Medical History:  Diagnosis Date  . Allergic rhinitis, cause unspecified   . Arthritis   . Depression   . Diverticulosis   . History of Bell's palsy   . History of diabetes mellitus   . Hypersomnia   . OSA on CPAP    not using at this time 06/15/19  . Other and unspecified hyperlipidemia   . Personal history of malignant neoplasm of ovary   . Type II or unspecified type diabetes mellitus without mention of complication, not stated as uncontrolled   . Unspecified essential hypertension   . Vertigo     Past Surgical History:  Procedure Laterality Date  . ABDOMINAL ADHESION SURGERY  12/96   exc peritoneal cyst  . BREAST BIOPSY Left 2000   stereotactic, negative  . BREAST LUMPECTOMY WITH RADIOACTIVE SEED AND SENTINEL LYMPH NODE BIOPSY Right 05/30/2019   Procedure: RIGHT BREAST LUMPECTOMY WITH BRACKETED RADIOACTIVE SEEDS  AND SENTINEL LYMPH NODE BIOPSY;  Surgeon: Stark Klein, MD;  Location: [place];  Service: General;  Laterality: Right;  . BREAST REDUCTION SURGERY Bilateral 10/03  . EYE SURGERY Left    cataracts  . LAPAROSCOPIC SALPINGO OOPHERECTOMY Right 12/96  . 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 LAPAROSCOPY ABDOMEN DIAGNOSTIC  2/08   w/LOA, LSO  . PORTACATH PLACEMENT  Left 06/16/2019   Procedure: INSERTION PORT-A-CATH;  Surgeon: Stark Klein, MD;  Location: [place];  Service: General;  Laterality: Left;  . REDUCTION MAMMAPLASTY Bilateral 2004  . TONSILLECTOMY    . TOTAL ABDOMINAL HYSTERECTOMY W/ BILATERAL SALPINGOOPHORECTOMY  5/94   secondary to uterine fibroids  . TOTAL KNEE ARTHROPLASTY Right 05/12/2013   Procedure: TOTAL KNEE ARTHROPLASTY;  Surgeon: Alta Corning, MD;  Location: [place];  Service: Orthopedics;  Laterality: Right;  . TRIGGER FINGER RELEASE  07/06/2012   Procedure: RELEASE TRIGGER FINGER/A-1 PULLEY;  Surgeon: Alta Corning, MD;  Location: [place];  Service: Orthopedics;  Laterality: Left;  left ring finger  . TUBAL LIGATION      There were no vitals filed for this visit.   Subjective Assessment - 03/29/20 0808    Subjective I already have my compression bras and doing well.    Pertinent History R breast cancer, s/p R lumpectomy and SLNB on 05/30/19, 3 nodes removed all negative, currently undergoing chemo, pt will begin radiation after chemo    Currently in Pain? No/denies                             [facility] Adult PT Treatment/Exercise - 03/29/20 0001      Shoulder Exercises: Supine   Horizontal ABduction Strengthening;Both;10 reps;Theraband  Theraband Level (Shoulder Horizontal ABduction) Level 1 (Yellow)    Horizontal ABduction Limitations vcs for keeping arms straight    External Rotation Both;15 reps    Theraband Level (Shoulder External Rotation) Level 1 (Yellow)    Flexion Strengthening;Both;Theraband;15 reps    Theraband Level (Shoulder Flexion) Level 1 (Yellow)    Diagonals Both;15 reps    Theraband Level (Shoulder Diagonals) Level 1 (Yellow)    Other Supine Exercises gave pt red bad to increase intensity at home as she reports doing 30 or so at home      Shoulder Exercises: Pulleys   Flexion 2 minutes    ABduction 2 minutes      Shoulder Exercises: Therapy Ball   Flexion 10 reps;Both       Manual Therapy   Manual therapy comments gave pt chip pack for more aggressive micromassage    Myofascial Release to R axilla in area of tightness and along the anterior portion of the R breast with flexion/abduction and horizontal rotation with fascia stabilized throughout P/ROM into motion    Manual Lymphatic Drainage (MLD) short neck, superficial and deep abdominals, Lt axillary nodes and establishment of interaxillary pathway, right inguinal nodes and establishment of axillo inguinal pathway, right breast moving fluid towards pathways; then reworked all surfaces    Passive ROM into flexion/abduction, and D2 to pts tolerance                       PT Long Term Goals - 03/07/20 0953      PT LONG TERM GOAL #1   Title Pt will demonstrate 155 degrees of R shoulder flexion to allow her to reach overhead.    Baseline R 126    Time 4    Period Weeks    Status New    Target Date 04/04/20      PT LONG TERM GOAL #2   Title Pt will demonstrates 160 degrees of R shoulder abduction to allow her to reach out to the side.    Baseline 114    Time 4    Period Weeks    Status New    Target Date 04/04/20      PT LONG TERM GOAL #3   Title Pt will be able to lift R arm above head without being limited by tightness and discomfort in R axilla.    Time 4    Period Weeks    Status New    Target Date 04/04/20      PT LONG TERM GOAL #4   Title Pt will be independent in a home exercise program for continued strengthening and stretching.    Time 4    Period Weeks    Status New    Target Date 04/04/20                 Plan - 03/29/20 0858    Clinical Impression Statement Pt is doing very well overall with improving AROM/PROM and mild fibrosis to the anterior superior breast and more soft edema to the lateral breast improved with MLD.    PT Frequency 2x / week    PT Duration 4 weeks    PT Treatment/Interventions ADLs/Self Care Home Management;Therapeutic  activities;Therapeutic exercise;Patient/family education;Manual lymph drainage;Manual techniques;Compression bandaging;Scar mobilization;Passive range of motion;Taping;Joint Manipulations    PT Next Visit Plan MLD to R breast, MFR to R axilla, ROM R shoulder, add pulleys and ball roll up wall; review supine scapular series    PT Home  Exercise Plan supine dowel exercises, self MLD; supine scapular series    Consulted and Agree with Plan of Care Patient           Patient will benefit from skilled therapeutic intervention in order to improve the following deficits and impairments:     Visit Diagnosis: Lymphedema, not elsewhere classified  Stiffness of right shoulder, not elsewhere classified  Abnormal posture  Muscle weakness (generalized)  Difficulty in walking, not elsewhere classified  Pain in right upper arm     Problem List Patient Active Problem List   Diagnosis Date Noted  . Chemotherapy-induced peripheral neuropathy ([place]) 03/26/2020  . Port-A-Cath in place 07/11/2019  . Malignant neoplasm of upper-inner quadrant of right breast in female, estrogen receptor positive ([place]) 04/19/2019  . Facial weakness 11/07/2018  . 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 History of hysterectomy 10/12/2018  . History of bilateral salpingo-oophorectomy (BSO) 10/12/2018  . Osteoarthritis of right knee 05/12/2013  . HYPERLIPIDEMIA 12/30/2007  . HYPERTENSION 12/30/2007  . INSOMNIA 12/30/2007  . HYPERSOMNIA 12/30/2007  . DM w/o Complication Type II 46/65/9935  . HYPERCHOLESTEROLEMIA 12/16/2007  . EXOGENOUS OBESITY 12/16/2007  . SLEEP APNEA, OBSTRUCTIVE 12/16/2007  . ALLERGIC RHINITIS 12/16/2007    Stark Bray 03/29/2020, 9:00 AM  [place], [name], [address] Phone: [phone]   Fax:  [phone]  Name: Kathy Reese MRN: [id number] Date of Birth:  1951/06/25

## 2020-04-01 ENCOUNTER — Ambulatory Visit: Payer: Medicare Other

## 2020-04-01 ENCOUNTER — Other Ambulatory Visit: Payer: Self-pay

## 2020-04-01 DIAGNOSIS — I890 Lymphedema, not elsewhere classified: Secondary | ICD-10-CM | POA: Diagnosis not present

## 2020-04-01 DIAGNOSIS — R293 Abnormal posture: Secondary | ICD-10-CM

## 2020-04-01 DIAGNOSIS — M6281 Muscle weakness (generalized): Secondary | ICD-10-CM

## 2020-04-01 DIAGNOSIS — M25611 Stiffness of right shoulder, not elsewhere classified: Secondary | ICD-10-CM

## 2020-04-01 NOTE — Therapy (Signed)
[place], [name], [address] Phone: [phone]   Fax:  [phone]  Physical Therapy Treatment  Patient Details  Name: Kathy Reese MRN: [id number] Date of Birth: 12/07/1950 Referring Provider (PT): Kinard   Encounter Date: 04/01/2020   PT End of Session - 04/01/20 1005    Visit Number 8    Number of Visits 9    Date for PT Re-Evaluation 04/04/20    PT Start Time 0912    PT Stop Time 1005    PT Time Calculation (min) 53 min    Activity Tolerance Patient tolerated treatment well    Behavior During Therapy [facility] for tasks assessed/performed           Past Medical History:  Diagnosis Date  . Allergic rhinitis, cause unspecified   . Arthritis   . Depression   . Diverticulosis   . History of Bell's palsy   . History of diabetes mellitus   . Hypersomnia   . OSA on CPAP    not using at this time 06/15/19  . Other and unspecified hyperlipidemia   . Personal history of chemotherapy   . Personal history of malignant neoplasm of ovary   . Personal history of radiation therapy   . Type II or unspecified type diabetes mellitus without mention of complication, not stated as uncontrolled   . Unspecified essential hypertension   . Vertigo     Past Surgical History:  Procedure Laterality Date  . ABDOMINAL ADHESION SURGERY  12/96   exc peritoneal cyst  . BREAST BIOPSY Left 2000   stereotactic, negative  . BREAST BIOPSY Right 05/08/2019  . BREAST LUMPECTOMY Right 05/30/2019  . BREAST LUMPECTOMY WITH RADIOACTIVE SEED AND SENTINEL LYMPH NODE BIOPSY Right 05/30/2019   Procedure: RIGHT BREAST LUMPECTOMY WITH BRACKETED RADIOACTIVE SEEDS  AND SENTINEL LYMPH NODE BIOPSY;  Surgeon: Stark Klein, MD;  Location: [place];  Service: General;  Laterality: Right;  . BREAST REDUCTION SURGERY Bilateral 10/03  . EYE SURGERY Left    cataracts  . LAPAROSCOPIC SALPINGO OOPHERECTOMY Right 12/96  . 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 LAPAROSCOPY ABDOMEN DIAGNOSTIC  2/08   w/LOA, LSO  . PORTACATH PLACEMENT Left 06/16/2019   Procedure: INSERTION PORT-A-CATH;  Surgeon: Stark Klein, MD;  Location: [place];  Service: General;  Laterality: Left;  . REDUCTION MAMMAPLASTY Bilateral 2004  . TONSILLECTOMY    . TOTAL ABDOMINAL HYSTERECTOMY W/ BILATERAL SALPINGOOPHORECTOMY  5/94   secondary to uterine fibroids  . TOTAL KNEE ARTHROPLASTY Right 05/12/2013   Procedure: TOTAL KNEE ARTHROPLASTY;  Surgeon: Alta Corning, MD;  Location: [place];  Service: Orthopedics;  Laterality: Right;  . TRIGGER FINGER RELEASE  07/06/2012   Procedure: RELEASE TRIGGER FINGER/A-1 PULLEY;  Surgeon: Alta Corning, MD;  Location: [place];  Service: Orthopedics;  Laterality: Left;  left ring finger  . TUBAL LIGATION      There were no vitals filed for this visit.   Subjective Assessment - 04/01/20 0922    Subjective My compression bras are working well, and insurance covered me getting 3 more bras! I can tell I'm doing better, I just still can feel the pull in my Rt axilla to breast. My Rt breast is doing much better, it's softer and not as big as before. I've been doing the self MLD as well. I would like to cont therapy if I can to get  the rest of the tightness out of my armpit when I reach.    Pertinent History R breast cancer, s/p R lumpectomy and SLNB on 05/30/19, 3 nodes removed all negative, currently undergoing chemo, pt will begin radiation after chemo    Patient Stated Goals be able to move my arm, soften the areas that are hard in the breast    Currently in Pain? No/denies                             [facility] Adult PT Treatment/Exercise - 04/01/20 0001      Self-Care   Self-Care Other Self-Care Comments    Other Self-Care Comments  Discussed pts current progress, answering her questions, and her options for D/C vs renewal. Pt would like to  cont but reduce to 1x/wk after this week. as she has improved well but is still limited by fascial restrictions at end Rt shoulder A/ and P/ROM.      Manual Therapy   Myofascial Release to R axilla in area of tightness, especially at end P/ROM of abd>flex    Manual Lymphatic Drainage (MLD) short neck, superficial and deep abdominals, Lt axillary nodes and establishment of anterior interaxillary pathway, right inguinal nodes and establishment of Rt axillo-inguinal pathway, right breast moving fluid towards pathways; then reworked all surfaces    Passive ROM into flexion/abduction, and D2 to pts tolerance encouraging pt to relax throughout, especially at end motions                       PT Long Term Goals - 03/07/20 0953      PT LONG TERM GOAL #1   Title Pt will demonstrate 155 degrees of R shoulder flexion to allow her to reach overhead.    Baseline R 126    Time 4    Period Weeks    Status New    Target Date 04/04/20      PT LONG TERM GOAL #2   Title Pt will demonstrates 160 degrees of R shoulder abduction to allow her to reach out to the side.    Baseline 114    Time 4    Period Weeks    Status New    Target Date 04/04/20      PT LONG TERM GOAL #3   Title Pt will be able to lift R arm above head without being limited by tightness and discomfort in R axilla.    Time 4    Period Weeks    Status New    Target Date 04/04/20      PT LONG TERM GOAL #4   Title Pt will be independent in a home exercise program for continued strengthening and stretching.    Time 4    Period Weeks    Status New    Target Date 04/04/20                 Plan - 04/01/20 1244    Clinical Impression Statement Pt arrived on wrong day for appt but was able to see her due to an opening in schedule. Answered pts questions about her progress thus far and D/C vs renewal in next 1-2 sessions. She hopes to be able to cont therapy at this time, though does agree that reducing to 1x/wk would  be reasonable for her current functional status. Where as her Rt breast lymphedema is much improved and she is independent now  with managing symptoms by self MLD and wear of compression bra, her ROM, though improved since start of care, is still some limited by fascial and musclular tightness. PT will need to assess for renewal at that time to determine if appropriate for pt to cont and/or decr to 1x/wk.    Personal Factors and Comorbidities Comorbidity 1;Comorbidity 2    Comorbidities diabetes, ovarian cancer,    Examination-Activity Limitations Reach Overhead;Carry;Lift    Examination-Participation Restrictions Cleaning;Meal Prep;Laundry;Yard Work    [employer] Evolving/Moderate complexity    Rehab Potential Excellent    PT Frequency 2x / week    PT Duration 4 weeks    PT Treatment/Interventions ADLs/Self Care Home Management;Therapeutic activities;Therapeutic exercise;Patient/family education;Manual lymph drainage;Manual techniques;Compression bandaging;Scar mobilization;Passive range of motion;Taping;Joint Manipulations    PT Next Visit Plan MLD to R breast, MFR to R axilla, ROM R shoulder, add pulleys and ball roll up wall; review supine scapular series; consider renewal in next 1-2 sessions and decr freq to 1x/wk.    PT Home Exercise Plan supine dowel exercises, self MLD; supine scapular series    Consulted and Agree with Plan of Care Patient           Patient will benefit from skilled therapeutic intervention in order to improve the following deficits and impairments:  Decreased range of motion, Decreased scar mobility, Increased edema, Decreased knowledge of precautions, Decreased strength, Increased fascial restricitons, Impaired UE functional use, Postural dysfunction, Pain  Visit Diagnosis: Lymphedema, not elsewhere classified  Stiffness of right shoulder, not elsewhere classified  Abnormal posture  Muscle weakness (generalized)     Problem  List Patient Active Problem List   Diagnosis Date Noted  . Chemotherapy-induced peripheral neuropathy ([place]) 03/26/2020  . Port-A-Cath in place 07/11/2019  . Malignant neoplasm of upper-inner quadrant of right breast in female, estrogen receptor positive ([place]) 04/19/2019  . Facial weakness 11/07/2018  . 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 History of hysterectomy 10/12/2018  . History of bilateral salpingo-oophorectomy (BSO) 10/12/2018  . Osteoarthritis of right knee 05/12/2013  . HYPERLIPIDEMIA 12/30/2007  . HYPERTENSION 12/30/2007  . INSOMNIA 12/30/2007  . HYPERSOMNIA 12/30/2007  . DM w/o Complication Type II 62/94/7654  . HYPERCHOLESTEROLEMIA 12/16/2007  . EXOGENOUS OBESITY 12/16/2007  . SLEEP APNEA, OBSTRUCTIVE 12/16/2007  . ALLERGIC RHINITIS 12/16/2007    Otelia Limes, PTA 04/01/2020, 12:49 PM  [place], [name], [address] Phone: [phone]   Fax:  [phone]  Name: Kathy Reese MRN: [id number] Date of Birth: 06/01/1951

## 2020-04-02 ENCOUNTER — Encounter: Payer: Medicare Other | Admitting: Physical Therapy

## 2020-04-03 ENCOUNTER — Telehealth: Payer: Self-pay

## 2020-04-03 NOTE — Telephone Encounter (Signed)
Called pt to notify of below information. Pt did not answer. LVM to return call to [phone] and ask for Lindsey's nurse.

## 2020-04-03 NOTE — Telephone Encounter (Signed)
-----   Message from Gardenia Phlegm, NP sent at 03/29/2020  6:21 PM EDT ----- Bone density normal.  Please notify patient.  ----- Message ----- From: Interface, Rad Results In Sent: 03/29/2020   4:53 PM EDT To: Gardenia Phlegm, NP

## 2020-04-04 ENCOUNTER — Ambulatory Visit: Payer: Medicare Other | Attending: Radiation Oncology

## 2020-04-04 ENCOUNTER — Other Ambulatory Visit: Payer: Self-pay

## 2020-04-04 DIAGNOSIS — M6281 Muscle weakness (generalized): Secondary | ICD-10-CM | POA: Diagnosis present

## 2020-04-04 DIAGNOSIS — R262 Difficulty in walking, not elsewhere classified: Secondary | ICD-10-CM | POA: Diagnosis present

## 2020-04-04 DIAGNOSIS — M79621 Pain in right upper arm: Secondary | ICD-10-CM | POA: Diagnosis present

## 2020-04-04 DIAGNOSIS — M25611 Stiffness of right shoulder, not elsewhere classified: Secondary | ICD-10-CM | POA: Diagnosis present

## 2020-04-04 DIAGNOSIS — R293 Abnormal posture: Secondary | ICD-10-CM | POA: Diagnosis present

## 2020-04-04 DIAGNOSIS — I890 Lymphedema, not elsewhere classified: Secondary | ICD-10-CM | POA: Insufficient documentation

## 2020-04-04 NOTE — Therapy (Signed)
[place], [name], [address] Phone: [phone]   Fax:  [phone]  Physical Therapy Treatment  Patient Details  Name: Kathy Reese MRN: [id number] Date of Birth: Apr 11, 1951 Referring Provider (PT): Kinard   Encounter Date: 04/04/2020   PT End of Session - 04/04/20 1004    Visit Number 9    Number of Visits 9    Date for PT Re-Evaluation 04/04/20    PT Start Time 0905    PT Stop Time 1004    PT Time Calculation (min) 59 min    Activity Tolerance Patient tolerated treatment well    Behavior During Therapy [facility] for tasks assessed/performed           Past Medical History:  Diagnosis Date  . Allergic rhinitis, cause unspecified   . Arthritis   . Depression   . Diverticulosis   . History of Bell's palsy   . History of diabetes mellitus   . Hypersomnia   . OSA on CPAP    not using at this time 06/15/19  . Other and unspecified hyperlipidemia   . Personal history of chemotherapy   . Personal history of malignant neoplasm of ovary   . Personal history of radiation therapy   . Type II or unspecified type diabetes mellitus without mention of complication, not stated as uncontrolled   . Unspecified essential hypertension   . Vertigo     Past Surgical History:  Procedure Laterality Date  . ABDOMINAL ADHESION SURGERY  12/96   exc peritoneal cyst  . BREAST BIOPSY Left 2000   stereotactic, negative  . BREAST BIOPSY Right 05/08/2019  . BREAST LUMPECTOMY Right 05/30/2019  . BREAST LUMPECTOMY WITH RADIOACTIVE SEED AND SENTINEL LYMPH NODE BIOPSY Right 05/30/2019   Procedure: RIGHT BREAST LUMPECTOMY WITH BRACKETED RADIOACTIVE SEEDS  AND SENTINEL LYMPH NODE BIOPSY;  Surgeon: Stark Klein, MD;  Location: [place];  Service: General;  Laterality: Right;  . BREAST REDUCTION SURGERY Bilateral 10/03  . EYE SURGERY Left    cataracts  . LAPAROSCOPIC SALPINGO OOPHERECTOMY Right 12/96  . 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 LAPAROSCOPY ABDOMEN DIAGNOSTIC  2/08   w/LOA, LSO  . PORTACATH PLACEMENT Left 06/16/2019   Procedure: INSERTION PORT-A-CATH;  Surgeon: Stark Klein, MD;  Location: [place];  Service: General;  Laterality: Left;  . REDUCTION MAMMAPLASTY Bilateral 2004  . TONSILLECTOMY    . TOTAL ABDOMINAL HYSTERECTOMY W/ BILATERAL SALPINGOOPHORECTOMY  5/94   secondary to uterine fibroids  . TOTAL KNEE ARTHROPLASTY Right 05/12/2013   Procedure: TOTAL KNEE ARTHROPLASTY;  Surgeon: Alta Corning, MD;  Location: [place];  Service: Orthopedics;  Laterality: Right;  . TRIGGER FINGER RELEASE  07/06/2012   Procedure: RELEASE TRIGGER FINGER/A-1 PULLEY;  Surgeon: Alta Corning, MD;  Location: [place];  Service: Orthopedics;  Laterality: Left;  left ring finger  . TUBAL LIGATION      There were no vitals filed for this visit.   Subjective Assessment - 04/04/20 0914    Subjective My Rt breast is really doing better. It's softer and actually now seems smaller than the Lt. And my reach is improving with my Rt UE. I can still feel the pull into my breast, but it's alot less than it was.    Pertinent History R breast cancer, s/p R lumpectomy and SLNB on 05/30/19, 3 nodes removed all negative, currently undergoing chemo, pt will  begin radiation after chemo    Patient Stated Goals be able to move my arm, soften the areas that are hard in the breast    Currently in Pain? No/denies                             [facility] Adult PT Treatment/Exercise - 04/04/20 0001      Shoulder Exercises: Pulleys   Flexion 2 minutes    ABduction 2 minutes      Shoulder Exercises: Therapy Ball   Flexion Both;10 reps   forward lean into end of stretch     Manual Therapy   Myofascial Release to R axilla in area of tightness, especially at end P/ROM of abd>flex    Manual Lymphatic Drainage (MLD) short neck, superficial and deep abdominals,  Lt axillary nodes and establishment of anterior interaxillary pathway, right inguinal nodes and establishment of Rt axillo-inguinal pathway, right breast moving fluid towards pathways; then reworked all surfaces    Passive ROM into flexion/abduction, and D2 to pts tolerance encouraging pt to relax throughout, especially at end motions                       PT Long Term Goals - 03/07/20 0953      PT LONG TERM GOAL #1   Title Pt will demonstrate 155 degrees of R shoulder flexion to allow her to reach overhead.    Baseline R 126    Time 4    Period Weeks    Status New    Target Date 04/04/20      PT LONG TERM GOAL #2   Title Pt will demonstrates 160 degrees of R shoulder abduction to allow her to reach out to the side.    Baseline 114    Time 4    Period Weeks    Status New    Target Date 04/04/20      PT LONG TERM GOAL #3   Title Pt will be able to lift R arm above head without being limited by tightness and discomfort in R axilla.    Time 4    Period Weeks    Status New    Target Date 04/04/20      PT LONG TERM GOAL #4   Title Pt will be independent in a home exercise program for continued strengthening and stretching.    Time 4    Period Weeks    Status New    Target Date 04/04/20                 Plan - 04/04/20 1004    Clinical Impression Statement Pt continues with excellent progress towards goals. She has good softening of Rt breast and wears her compression bra daily. And her Rt shoulder A/ROM is improving with ADL reaching, along with improved P/ROM, though still with some limitations at end motions. She reports still feeling pull into Rt breast from axilla at end ROM but that this has also improved since start of care. Pt would benefit from continued therapy, though decreased freq as she is doing well but would like to have less tightness at end motions that still can limit her at times.    Personal Factors and Comorbidities Comorbidity 1;Comorbidity  2    Comorbidities diabetes, ovarian cancer,    Examination-Activity Limitations Reach Overhead;Carry;Lift    Examination-Participation Restrictions Cleaning;Meal Prep;Laundry;Yard Work    [employer] Evolving/Moderate complexity  Rehab Potential Excellent    PT Frequency 2x / week    PT Duration 4 weeks    PT Treatment/Interventions ADLs/Self Care Home Management;Therapeutic activities;Therapeutic exercise;Patient/family education;Manual lymph drainage;Manual techniques;Compression bandaging;Scar mobilization;Passive range of motion;Taping;Joint Manipulations    PT Next Visit Plan Potential renewal next but reduce freq to 1x/wk if PT deems appropriate next; MLD to R breast, MFR to R axilla, ROM R shoulder, cont pulleys and ball roll up wall; review supine scapular series;    PT Home Exercise Plan supine dowel exercises, self MLD; supine scapular series    Consulted and Agree with Plan of Care Patient           Patient will benefit from skilled therapeutic intervention in order to improve the following deficits and impairments:  Decreased range of motion, Decreased scar mobility, Increased edema, Decreased knowledge of precautions, Decreased strength, Increased fascial restricitons, Impaired UE functional use, Postural dysfunction, Pain  Visit Diagnosis: Lymphedema, not elsewhere classified  Stiffness of right shoulder, not elsewhere classified  Abnormal posture  Muscle weakness (generalized)     Problem List Patient Active Problem List   Diagnosis Date Noted  . Chemotherapy-induced peripheral neuropathy ([place]) 03/26/2020  . Port-A-Cath in place 07/11/2019  . Malignant neoplasm of upper-inner quadrant of right breast in female, estrogen receptor positive ([place]) 04/19/2019  . Facial weakness 11/07/2018  . 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 History of hysterectomy 10/12/2018  . History of bilateral salpingo-oophorectomy  (BSO) 10/12/2018  . Osteoarthritis of right knee 05/12/2013  . HYPERLIPIDEMIA 12/30/2007  . HYPERTENSION 12/30/2007  . INSOMNIA 12/30/2007  . HYPERSOMNIA 12/30/2007  . DM w/o Complication Type II 10/93/2355  . HYPERCHOLESTEROLEMIA 12/16/2007  . EXOGENOUS OBESITY 12/16/2007  . SLEEP APNEA, OBSTRUCTIVE 12/16/2007  . ALLERGIC RHINITIS 12/16/2007    Otelia Limes, PTA 04/04/2020, 11:31 AM  [place], [name], [address] Phone: [phone]   Fax:  [phone]  Name: Kathy Reese MRN: [id number] Date of Birth: 1950/12/26

## 2020-04-05 ENCOUNTER — Telehealth: Payer: Self-pay

## 2020-04-05 NOTE — Telephone Encounter (Signed)
Made patient aware of bone density normal results no question or concerns voiced

## 2020-04-09 ENCOUNTER — Encounter (INDEPENDENT_AMBULATORY_CARE_PROVIDER_SITE_OTHER): Payer: Self-pay

## 2020-04-09 ENCOUNTER — Other Ambulatory Visit: Payer: Self-pay

## 2020-04-09 ENCOUNTER — Ambulatory Visit: Payer: Medicare Other | Admitting: Physical Therapy

## 2020-04-09 ENCOUNTER — Encounter: Payer: Self-pay | Admitting: Physical Therapy

## 2020-04-09 DIAGNOSIS — I890 Lymphedema, not elsewhere classified: Secondary | ICD-10-CM

## 2020-04-09 DIAGNOSIS — R293 Abnormal posture: Secondary | ICD-10-CM

## 2020-04-09 DIAGNOSIS — M25611 Stiffness of right shoulder, not elsewhere classified: Secondary | ICD-10-CM

## 2020-04-09 DIAGNOSIS — M6281 Muscle weakness (generalized): Secondary | ICD-10-CM

## 2020-04-09 NOTE — Therapy (Signed)
[place], [name], [address] Phone: [phone]   Fax:  [phone]  Physical Therapy Treatment  Patient Details  Name: Kathy Reese MRN: [id number] Date of Birth: [birth year] Referring Provider (PT): Kinard   Encounter Date: 04/09/2020   PT End of Session - 04/09/20 0957    Visit Number 10    Number of Visits 17    Date for PT Re-Evaluation 05/07/20    PT Start Time 0913   pt arrived late   PT Stop Time 0955    PT Time Calculation (min) 42 min    Activity Tolerance Patient tolerated treatment well    Behavior During Therapy [facility] for tasks assessed/performed           Past Medical History:  Diagnosis Date  . Allergic rhinitis, cause unspecified   . Arthritis   . Depression   . Diverticulosis   . History of Bell's palsy   . History of diabetes mellitus   . Hypersomnia   . OSA on CPAP    not using at this time 06/15/19  . Other and unspecified hyperlipidemia   . Personal history of chemotherapy   . Personal history of malignant neoplasm of ovary   . Personal history of radiation therapy   . Type II or unspecified type diabetes mellitus without mention of complication, not stated as uncontrolled   . Unspecified essential hypertension   . Vertigo     Past Surgical History:  Procedure Laterality Date  . ABDOMINAL ADHESION SURGERY  12/96   exc peritoneal cyst  . BREAST BIOPSY Left 2000   stereotactic, negative  . BREAST BIOPSY Right 05/08/2019  . BREAST LUMPECTOMY Right 05/30/2019  . BREAST LUMPECTOMY WITH RADIOACTIVE SEED AND SENTINEL LYMPH NODE BIOPSY Right 05/30/2019   Procedure: RIGHT BREAST LUMPECTOMY WITH BRACKETED RADIOACTIVE SEEDS  AND SENTINEL LYMPH NODE BIOPSY;  Surgeon: Stark Klein, MD;  Location: [place];  Service: General;  Laterality: Right;  . BREAST REDUCTION SURGERY Bilateral 10/03  . EYE SURGERY Left    cataracts  . LAPAROSCOPIC SALPINGO OOPHERECTOMY Right 12/96    . 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 LAPAROSCOPY ABDOMEN DIAGNOSTIC  2/08   w/LOA, LSO  . PORTACATH PLACEMENT Left 06/16/2019   Procedure: INSERTION PORT-A-CATH;  Surgeon: Stark Klein, MD;  Location: [place];  Service: General;  Laterality: Left;  . REDUCTION MAMMAPLASTY Bilateral 2004  . TONSILLECTOMY    . TOTAL ABDOMINAL HYSTERECTOMY W/ BILATERAL SALPINGOOPHORECTOMY  5/94   secondary to uterine fibroids  . TOTAL KNEE ARTHROPLASTY Right 05/12/2013   Procedure: TOTAL KNEE ARTHROPLASTY;  Surgeon: Alta Corning, MD;  Location: [place];  Service: Orthopedics;  Laterality: Right;  . TRIGGER FINGER RELEASE  07/06/2012   Procedure: RELEASE TRIGGER FINGER/A-1 PULLEY;  Surgeon: Alta Corning, MD;  Location: [place];  Service: Orthopedics;  Laterality: Left;  left ring finger  . TUBAL LIGATION      There were no vitals filed for this visit.   Subjective Assessment - 04/09/20 0915    Subjective The breast is doing ok. I still have a little pull when I move my arm.    Pertinent History R breast cancer, s/p R lumpectomy and SLNB on 05/30/19, 3 nodes removed all negative, currently undergoing chemo, pt will begin radiation after chemo    Patient Stated Goals be able to move my arm, soften the areas that are  hard in the breast    Currently in Pain? No/denies    Pain Score 0-No pain              [facility] PT Assessment - 04/09/20 0001      AROM   Right Shoulder Flexion 122 Degrees   149 by end of session   Right Shoulder ABduction 123 Degrees   160 at end of session                        [facility] Adult PT Treatment/Exercise - 04/09/20 0001      Manual Therapy   Soft tissue mobilization along R pec muscle to decrease tightness    Myofascial Release to R axilla in area of tightness, especially at end P/ROM of abd>flex    Passive ROM into flexion/abduction to pt's tolerance while performing STM to R pec and MFR to R  axilla                       PT Long Term Goals - 04/09/20 0916      PT LONG TERM GOAL #1   Title Pt will demonstrate 155 degrees of R shoulder flexion to allow her to reach overhead.    Baseline R 126, 04/09/20- 122    Time 4    Period Weeks    Status On-going      PT LONG TERM GOAL #2   Title Pt will demonstrates 160 degrees of R shoulder abduction to allow her to reach out to the side.    Baseline 114, 04/09/20- 123    Time 4    Period Weeks    Status On-going      PT LONG TERM GOAL #3   Title Pt will be able to lift R arm above head without being limited by tightness and discomfort in R axilla.    Baseline 04/09/20- pt reports she can reach up higher before the discomfort in the axilla starts    Time 4    Period Weeks    Status On-going      PT LONG TERM GOAL #4   Title Pt will be independent in a home exercise program for continued strengthening and stretching.    Baseline 04/09/20- still adding exercises    Time 4    Period Weeks    Status On-going                 Plan - 04/09/20 3220    Clinical Impression Statement Assessed pt's progress towards goals in therapy. ROM measurements taken at beginning of session and pt was still very limited with abduction and flexion. She still felt a lot of tightness in her right axilla with RUE movements. Focused today on myofascial release to R axilla, PROM to R shoulder and STM to R pec and by end of session pt's ROM had improved greatly. Pt would benefit from continued skilled PT services to increase R shoulder ROM and decrease R axillary tightness.    Examination-Participation Restrictions Cleaning;Meal Prep;Laundry;Yard Work    PT Frequency 2x / week    PT Duration 4 weeks    PT Treatment/Interventions ADLs/Self Care Home Management;Therapeutic activities;Therapeutic exercise;Patient/family education;Manual lymph drainage;Manual techniques;Compression bandaging;Scar mobilization;Passive range of motion;Taping;Joint  Manipulations    PT Next Visit Plan MFR to R axilla, ROM R shoulder, cont pulleys and ball roll up wall; review supine scapular series;    PT Home Exercise Plan supine dowel exercises, self  MLD; supine scapular series    Consulted and Agree with Plan of Care Patient           Patient will benefit from skilled therapeutic intervention in order to improve the following deficits and impairments:  Decreased range of motion, Decreased scar mobility, Increased edema, Decreased knowledge of precautions, Decreased strength, Increased fascial restricitons, Impaired UE functional use, Postural dysfunction, Pain  Visit Diagnosis: Stiffness of right shoulder, not elsewhere classified  Abnormal posture  Lymphedema, not elsewhere classified  Muscle weakness (generalized)     Problem List Patient Active Problem List   Diagnosis Date Noted  . Chemotherapy-induced peripheral neuropathy ([place]) 03/26/2020  . Port-A-Cath in place 07/11/2019  . Malignant neoplasm of upper-inner quadrant of right breast in female, estrogen receptor positive ([place]) 04/19/2019  . Facial weakness 11/07/2018  . 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 History of hysterectomy 10/12/2018  . History of bilateral salpingo-oophorectomy (BSO) 10/12/2018  . Osteoarthritis of right knee 05/12/2013  . HYPERLIPIDEMIA 12/30/2007  . HYPERTENSION 12/30/2007  . INSOMNIA 12/30/2007  . HYPERSOMNIA 12/30/2007  . DM w/o Complication Type II 13/05/6577  . HYPERCHOLESTEROLEMIA 12/16/2007  . EXOGENOUS OBESITY 12/16/2007  . SLEEP APNEA, OBSTRUCTIVE 12/16/2007  . ALLERGIC RHINITIS 12/16/2007    Allyson Sabal [facility] 04/09/2020, 10:03 AM  [place], [name], [address] Phone: [phone]   Fax:  [phone]  Name: Clarabell Matsuoka MRN: [id number] Date of Birth: 1950-11-11  Manus Gunning, PT 04/09/20 10:03 AM

## 2020-04-10 ENCOUNTER — Ambulatory Visit: Payer: Medicare Other

## 2020-04-10 DIAGNOSIS — I890 Lymphedema, not elsewhere classified: Secondary | ICD-10-CM

## 2020-04-10 DIAGNOSIS — M25611 Stiffness of right shoulder, not elsewhere classified: Secondary | ICD-10-CM

## 2020-04-10 DIAGNOSIS — M6281 Muscle weakness (generalized): Secondary | ICD-10-CM

## 2020-04-10 DIAGNOSIS — R293 Abnormal posture: Secondary | ICD-10-CM

## 2020-04-10 NOTE — Therapy (Signed)
[place], [name], [address] Phone: [phone]   Fax:  [phone]  Physical Therapy Treatment  Patient Details  Name: Kathy Reese MRN: [id number] Date of Birth: 1951/03/05 Referring Provider (PT): Kinard   Encounter Date: 04/10/2020   PT End of Session - 04/10/20 0901    Visit Number 11    Number of Visits 17    Date for PT Re-Evaluation 05/07/20    PT Start Time 0902    PT Stop Time 1000    PT Time Calculation (min) 58 min    Activity Tolerance Patient tolerated treatment well    Behavior During Therapy [facility] for tasks assessed/performed           Past Medical History:  Diagnosis Date  . Allergic rhinitis, cause unspecified   . Arthritis   . Depression   . Diverticulosis   . History of Bell's palsy   . History of diabetes mellitus   . Hypersomnia   . OSA on CPAP    not using at this time 06/15/19  . Other and unspecified hyperlipidemia   . Personal history of chemotherapy   . Personal history of malignant neoplasm of ovary   . Personal history of radiation therapy   . Type II or unspecified type diabetes mellitus without mention of complication, not stated as uncontrolled   . Unspecified essential hypertension   . Vertigo     Past Surgical History:  Procedure Laterality Date  . ABDOMINAL ADHESION SURGERY  12/96   exc peritoneal cyst  . BREAST BIOPSY Left 2000   stereotactic, negative  . BREAST BIOPSY Right 05/08/2019  . BREAST LUMPECTOMY Right 05/30/2019  . BREAST LUMPECTOMY WITH RADIOACTIVE SEED AND SENTINEL LYMPH NODE BIOPSY Right 05/30/2019   Procedure: RIGHT BREAST LUMPECTOMY WITH BRACKETED RADIOACTIVE SEEDS  AND SENTINEL LYMPH NODE BIOPSY;  Surgeon: Stark Klein, MD;  Location: [place];  Service: General;  Laterality: Right;  . BREAST REDUCTION SURGERY Bilateral 10/03  . EYE SURGERY Left    cataracts  . LAPAROSCOPIC SALPINGO OOPHERECTOMY Right 12/96  . 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 LAPAROSCOPY ABDOMEN DIAGNOSTIC  2/08   w/LOA, LSO  . PORTACATH PLACEMENT Left 06/16/2019   Procedure: INSERTION PORT-A-CATH;  Surgeon: Stark Klein, MD;  Location: [place];  Service: General;  Laterality: Left;  . REDUCTION MAMMAPLASTY Bilateral 2004  . TONSILLECTOMY    . TOTAL ABDOMINAL HYSTERECTOMY W/ BILATERAL SALPINGOOPHORECTOMY  5/94   secondary to uterine fibroids  . TOTAL KNEE ARTHROPLASTY Right 05/12/2013   Procedure: TOTAL KNEE ARTHROPLASTY;  Surgeon: Alta Corning, MD;  Location: [place];  Service: Orthopedics;  Laterality: Right;  . TRIGGER FINGER RELEASE  07/06/2012   Procedure: RELEASE TRIGGER FINGER/A-1 PULLEY;  Surgeon: Alta Corning, MD;  Location: [place];  Service: Orthopedics;  Laterality: Left;  left ring finger  . TUBAL LIGATION      There were no vitals filed for this visit.   Subjective Assessment - 04/10/20 0902    Subjective Pt states that she has been doing her exercises at home. She states that her arm is feeling pretty good today.    Pertinent History R breast cancer, s/p R lumpectomy and SLNB on 05/30/19, 3 nodes removed all negative, currently undergoing chemo, pt will begin radiation after chemo    Patient Stated Goals be able to move my arm, soften the areas that are  hard in the breast    Currently in Pain? No/denies    Pain Score 0-No pain              [facility] PT Assessment - 04/10/20 0001      AROM   Right Shoulder Flexion 130 Degrees   161 at end of session   Right Shoulder ABduction 95 Degrees   146 at end of session.                         [place] Adult PT Treatment/Exercise - 04/10/20 0001      Manual Therapy   Manual Therapy Soft tissue mobilization;Myofascial release;Passive ROM;Joint mobilization    Joint Mobilization P-A at the clavicle on the acromion 8x8 1x due to reports of pain into end range abduction; decreased following grade III  mobs.     Soft tissue mobilization along the R pectoralis muscle and R deltoid; decreased minimally following STM.     Myofascial Release to R axilla, inferior/lateral R breast, cross hand along the lateral breast, anterior breast, medial proxima. brachium to axilla and then to lateral breast, then myofascial stabilization under the R lateral breast with abduction and flexion.     Passive ROM into flexion/abduction with intermittent myofascial release and STM.                   PT Education - 04/10/20 1001    Education Details Pt will continue with exercises and stretching at home.    Person(s) Educated Patient    Methods Explanation    Comprehension Verbalized understanding               PT Long Term Goals - 04/09/20 0916      PT LONG TERM GOAL #1   Title Pt will demonstrate 155 degrees of R shoulder flexion to allow her to reach overhead.    Baseline R 126, 04/09/20- 122    Time 4    Period Weeks    Status On-going      PT LONG TERM GOAL #2   Title Pt will demonstrates 160 degrees of R shoulder abduction to allow her to reach out to the side.    Baseline 114, 04/09/20- 123    Time 4    Period Weeks    Status On-going      PT LONG TERM GOAL #3   Title Pt will be able to lift R arm above head without being limited by tightness and discomfort in R axilla.    Baseline 04/09/20- pt reports she can reach up higher before the discomfort in the axilla starts    Time 4    Period Weeks    Status On-going      PT LONG TERM GOAL #4   Title Pt will be independent in a home exercise program for continued strengthening and stretching.    Baseline 04/09/20- still adding exercises    Time 4    Period Weeks    Status On-going                 Plan - 04/10/20 0901    Clinical Impression Statement Pt ROM was slightly improved at baseline before treatment today in flexion; she did demonstrate decreased abduction but could be due to interrater reliability with goniometer  measurements. Pt again had significant improvement with over 50 degrees in abduction and 30 degrees in flexion following myofascial release and STM. Joint mobs performed at the [facility]  joint due to pt reporting pain down the lateral brachium with end range abduction P/ROM; reports of pain decreased following grade III mobilizations most likely pain was related to stiffness. Adhesions noted at the inferior lateral R breast where pt reports pulling into flexion and abduction that improved following treatment session. Pt will benefit from continued POC at this time.    Personal Factors and Comorbidities Comorbidity 1;Comorbidity 2    Comorbidities diabetes, ovarian cancer,    Examination-Activity Limitations Reach Overhead;Carry;Lift    Examination-Participation Restrictions Cleaning;Meal Prep;Laundry;Yard Work    Dispensing optician    PT Frequency 2x / week    PT Duration 4 weeks    PT Treatment/Interventions ADLs/Self Care Home Management;Therapeutic activities;Therapeutic exercise;Patient/family education;Manual lymph drainage;Manual techniques;Compression bandaging;Scar mobilization;Passive range of motion;Taping;Joint Manipulations    PT Next Visit Plan MFR to R axilla, ROM R shoulder, cont pulleys and ball roll up wall; review supine scapular series;    PT Home Exercise Plan supine dowel exercises, self MLD; supine scapular series    Consulted and Agree with Plan of Care Patient           Patient will benefit from skilled therapeutic intervention in order to improve the following deficits and impairments:  Decreased range of motion, Decreased scar mobility, Increased edema, Decreased knowledge of precautions, Decreased strength, Increased fascial restricitons, Impaired UE functional use, Postural dysfunction, Pain  Visit Diagnosis: Stiffness of right shoulder, not elsewhere classified  Abnormal posture  Lymphedema, not elsewhere classified  Muscle weakness  (generalized)     Problem List Patient Active Problem List   Diagnosis Date Noted  . Chemotherapy-induced peripheral neuropathy ([place]) 03/26/2020  . Port-A-Cath in place 07/11/2019  . Malignant neoplasm of upper-inner quadrant of right breast in female, estrogen receptor positive ([place]) 04/19/2019  . Facial weakness 11/07/2018  . 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 History of hysterectomy 10/12/2018  . History of bilateral salpingo-oophorectomy (BSO) 10/12/2018  . Osteoarthritis of right knee 05/12/2013  . HYPERLIPIDEMIA 12/30/2007  . HYPERTENSION 12/30/2007  . INSOMNIA 12/30/2007  . HYPERSOMNIA 12/30/2007  . DM w/o Complication Type II 09/32/6712  . HYPERCHOLESTEROLEMIA 12/16/2007  . EXOGENOUS OBESITY 12/16/2007  . SLEEP APNEA, OBSTRUCTIVE 12/16/2007  . ALLERGIC RHINITIS 12/16/2007    Ander Purpura, PT 04/10/2020, 10:06 AM  [place], [name], [address] Phone: [phone]   Fax:  [phone]  Name: Kathy Reese MRN: [id number] Date of Birth: 01-24-1951

## 2020-04-15 ENCOUNTER — Ambulatory Visit: Payer: Medicare Other | Admitting: Podiatry

## 2020-04-15 ENCOUNTER — Other Ambulatory Visit: Payer: Self-pay

## 2020-04-15 ENCOUNTER — Ambulatory Visit: Payer: Medicare Other | Admitting: Physical Therapy

## 2020-04-15 ENCOUNTER — Encounter: Payer: Self-pay | Admitting: Podiatry

## 2020-04-15 VITALS — Temp 97.4°F

## 2020-04-15 DIAGNOSIS — L03032 Cellulitis of left toe: Secondary | ICD-10-CM | POA: Diagnosis not present

## 2020-04-15 DIAGNOSIS — M6281 Muscle weakness (generalized): Secondary | ICD-10-CM

## 2020-04-15 DIAGNOSIS — I890 Lymphedema, not elsewhere classified: Secondary | ICD-10-CM | POA: Diagnosis not present

## 2020-04-15 DIAGNOSIS — M25611 Stiffness of right shoulder, not elsewhere classified: Secondary | ICD-10-CM

## 2020-04-15 DIAGNOSIS — R293 Abnormal posture: Secondary | ICD-10-CM

## 2020-04-15 DIAGNOSIS — L600 Ingrowing nail: Secondary | ICD-10-CM

## 2020-04-15 NOTE — Progress Notes (Signed)
Subjective:   Patient ID: Kathy Reese, female   DOB: 69 y.o.   MRN: [id number]   HPI Patient presents stating that she has had chemotherapy and her nails have become thinner and the left big toenail has become loose and can be moderately bothersome.  Does not remember specific injury and does not smoke likes to be active   Review of Systems  All other systems reviewed and are negative.       Objective:  Physical Exam Vitals and nursing note reviewed.  Constitutional:      Appearance: She is well-developed.  Pulmonary:     Effort: Pulmonary effort is normal.  Musculoskeletal:        General: Normal range of motion.  Skin:    General: Skin is warm.  Neurological:     Mental Status: She is alert.     Neurovascular status intact muscle strength found to be adequate range of motion was adequate mild diminishment of sharp dull vibratory.  Patient has a loose left hallux nail that is localized and there is no proximal edema erythema or drainage noted.  Good digital perfusion well oriented x3     Assessment:  Traumatized left hallux nail with looseness with possibility for localized infection     Plan:  H&P condition reviewed and today I infiltrated the left hallux 60 mg like Marcaine mixture sterile prep applied to the toe and using sterile instrumentation I remove the nail I found to be loose with a clear fluid underneath indicating trauma.  I did not note any other significant pathology and sterile dressing was reapplied with instructions on soaks

## 2020-04-15 NOTE — Therapy (Signed)
[place], [name], [address] Phone: [phone]   Fax:  [phone]  Physical Therapy Treatment  Patient Details  Name: Kathy Reese MRN: [id number] Date of Birth: Oct 28, 1950 Referring Provider (PT): Kinard   Encounter Date: 04/15/2020   PT End of Session - 04/15/20 1051    Visit Number 12    Number of Visits 17    Date for PT Re-Evaluation 05/07/20    PT Start Time 7902   pt arrives late due to podiatrist appt   PT Stop Time 1052    PT Time Calculation (min) 37 min    Activity Tolerance Patient tolerated treatment well    Behavior During Therapy [facility] for tasks assessed/performed           Past Medical History:  Diagnosis Date  . Allergic rhinitis, cause unspecified   . Arthritis   . Depression   . Diverticulosis   . History of Bell's palsy   . History of diabetes mellitus   . Hypersomnia   . OSA on CPAP    not using at this time 06/15/19  . Other and unspecified hyperlipidemia   . Personal history of chemotherapy   . Personal history of malignant neoplasm of ovary   . Personal history of radiation therapy   . Type II or unspecified type diabetes mellitus without mention of complication, not stated as uncontrolled   . Unspecified essential hypertension   . Vertigo     Past Surgical History:  Procedure Laterality Date  . ABDOMINAL ADHESION SURGERY  12/96   exc peritoneal cyst  . BREAST BIOPSY Left 2000   stereotactic, negative  . BREAST BIOPSY Right 05/08/2019  . BREAST LUMPECTOMY Right 05/30/2019  . BREAST LUMPECTOMY WITH RADIOACTIVE SEED AND SENTINEL LYMPH NODE BIOPSY Right 05/30/2019   Procedure: RIGHT BREAST LUMPECTOMY WITH BRACKETED RADIOACTIVE SEEDS  AND SENTINEL LYMPH NODE BIOPSY;  Surgeon: Stark Klein, MD;  Location: [place];  Service: General;  Laterality: Right;  . BREAST REDUCTION SURGERY Bilateral 10/03  . EYE SURGERY Left    cataracts  . LAPAROSCOPIC SALPINGO  OOPHERECTOMY Right 12/96  . 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 LAPAROSCOPY ABDOMEN DIAGNOSTIC  2/08   w/LOA, LSO  . PORTACATH PLACEMENT Left 06/16/2019   Procedure: INSERTION PORT-A-CATH;  Surgeon: Stark Klein, MD;  Location: [place];  Service: General;  Laterality: Left;  . REDUCTION MAMMAPLASTY Bilateral 2004  . TONSILLECTOMY    . TOTAL ABDOMINAL HYSTERECTOMY W/ BILATERAL SALPINGOOPHORECTOMY  5/94   secondary to uterine fibroids  . TOTAL KNEE ARTHROPLASTY Right 05/12/2013   Procedure: TOTAL KNEE ARTHROPLASTY;  Surgeon: Alta Corning, MD;  Location: [place];  Service: Orthopedics;  Laterality: Right;  . TRIGGER FINGER RELEASE  07/06/2012   Procedure: RELEASE TRIGGER FINGER/A-1 PULLEY;  Surgeon: Alta Corning, MD;  Location: [place];  Service: Orthopedics;  Laterality: Left;  left ring finger  . TUBAL LIGATION      There were no vitals filed for this visit.   Subjective Assessment - 04/15/20 1018    Subjective Pt states she just had a little surgery on her left great toe. She does her exercises at home and feels like her shoulder is doing good    Pertinent History R breast cancer, s/p R lumpectomy and SLNB on 05/30/19, 3 nodes removed all negative, currently undergoing chemo, pt will begin radiation after chemo  Patient Stated Goals be able to move my arm, soften the areas that are hard in the breast    Currently in Pain? No/denies                             [facility] Adult PT Treatment/Exercise - 04/15/20 0001      Shoulder Exercises: Sidelying   ABduction AAROM;Right;10 reps      Shoulder Exercises: Pulleys   Flexion 2 minutes    ABduction 2 minutes      Shoulder Exercises: Therapy Ball   Flexion Both;10 reps   forward lean into end of stretch     Manual Therapy   Soft tissue mobilization  in  left sidelying to  right axilla and posterior scapular muslces     Myofascial Release to R axilla,  inferior/lateral R breast, cross hand along the lateral breast, anterior breast, medial proxima. brachium to axilla and then to lateral breast, then myofascial stabilization under the R lateral breast with abduction and flexion.     Passive ROM into flexion/abduction with intermittent myofascial release and STM.                        PT Long Term Goals - 04/09/20 0916      PT LONG TERM GOAL #1   Title Pt will demonstrate 155 degrees of R shoulder flexion to allow her to reach overhead.    Baseline R 126, 04/09/20- 122    Time 4    Period Weeks    Status On-going      PT LONG TERM GOAL #2   Title Pt will demonstrates 160 degrees of R shoulder abduction to allow her to reach out to the side.    Baseline 114, 04/09/20- 123    Time 4    Period Weeks    Status On-going      PT LONG TERM GOAL #3   Title Pt will be able to lift R arm above head without being limited by tightness and discomfort in R axilla.    Baseline 04/09/20- pt reports she can reach up higher before the discomfort in the axilla starts    Time 4    Period Weeks    Status On-going      PT LONG TERM GOAL #4   Title Pt will be independent in a home exercise program for continued strengthening and stretching.    Baseline 04/09/20- still adding exercises    Time 4    Period Weeks    Status On-going                 Plan - 04/15/20 1052    Clinical Impression Statement Pt is doing better with her breast lymphedema and shoulder ROM but still if feeling tightness in her axilla. She feels like she is able to do her exercise at home. She feels like she may be getting close to discharge    Personal Factors and Comorbidities Comorbidity 1;Comorbidity 2    Comorbidities diabetes, ovarian cancer,    Examination-Activity Limitations Reach Overhead;Carry;Lift    Examination-Participation Restrictions Cleaning;Meal Prep;Laundry;Yard Work    Merchant navy officer Evolving/Moderate complexity    Rehab  Potential Excellent    PT Frequency 2x / week    PT Duration 4 weeks    PT Treatment/Interventions ADLs/Self Care Home Management;Therapeutic activities;Therapeutic exercise;Patient/family education;Manual lymph drainage;Manual techniques;Compression bandaging;Scar mobilization;Passive range of motion;Taping;Joint Manipulations    PT  Next Visit Plan Recheck goals MFR to R axilla, ROM R shoulder, cont pulleys and ball roll up wall update HEP for stretching and strengthening    PT Home Exercise Plan supine dowel exercises, self MLD; supine scapular series    Consulted and Agree with Plan of Care Patient           Patient will benefit from skilled therapeutic intervention in order to improve the following deficits and impairments:  Decreased range of motion, Decreased scar mobility, Increased edema, Decreased knowledge of precautions, Decreased strength, Increased fascial restricitons, Impaired UE functional use, Postural dysfunction, Pain  Visit Diagnosis: Stiffness of right shoulder, not elsewhere classified  Abnormal posture  Lymphedema, not elsewhere classified  Muscle weakness (generalized)     Problem List Patient Active Problem List   Diagnosis Date Noted  . Chemotherapy-induced peripheral neuropathy ([place]) 03/26/2020  . Port-A-Cath in place 07/11/2019  . Malignant neoplasm of upper-inner quadrant of right breast in female, estrogen receptor positive ([place]) 04/19/2019  . Facial weakness 11/07/2018  . 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 History of hysterectomy 10/12/2018  . History of bilateral salpingo-oophorectomy (BSO) 10/12/2018  . Osteoarthritis of right knee 05/12/2013  . HYPERLIPIDEMIA 12/30/2007  . HYPERTENSION 12/30/2007  . INSOMNIA 12/30/2007  . HYPERSOMNIA 12/30/2007  . DM w/o Complication Type II 63/10/6008  . HYPERCHOLESTEROLEMIA 12/16/2007  . EXOGENOUS OBESITY 12/16/2007  . SLEEP APNEA, OBSTRUCTIVE 12/16/2007  . ALLERGIC  RHINITIS 12/16/2007   Donato Heinz. Owens Shark PT  Kathy Reese 04/15/2020, 11:00 AM  [place], [name], [address] Phone: [phone]   Fax:  [phone]  Name: Kathy Reese MRN: [id number] Date of Birth: [birth year]

## 2020-04-15 NOTE — Patient Instructions (Signed)

## 2020-04-16 ENCOUNTER — Inpatient Hospital Stay: Payer: Medicare Other

## 2020-04-16 ENCOUNTER — Encounter (INDEPENDENT_AMBULATORY_CARE_PROVIDER_SITE_OTHER): Payer: Self-pay

## 2020-04-16 ENCOUNTER — Other Ambulatory Visit: Payer: Self-pay

## 2020-04-16 ENCOUNTER — Inpatient Hospital Stay: Payer: Medicare Other | Attending: Hematology and Oncology

## 2020-04-16 VITALS — BP 161/69 | HR 52 | Temp 97.9°F | Resp 18

## 2020-04-16 DIAGNOSIS — Z170 Estrogen receptor positive status [ER+]: Secondary | ICD-10-CM | POA: Diagnosis not present

## 2020-04-16 DIAGNOSIS — C50211 Malignant neoplasm of upper-inner quadrant of right female breast: Secondary | ICD-10-CM

## 2020-04-16 DIAGNOSIS — Z95828 Presence of other vascular implants and grafts: Secondary | ICD-10-CM

## 2020-04-16 DIAGNOSIS — Z5112 Encounter for antineoplastic immunotherapy: Secondary | ICD-10-CM | POA: Diagnosis not present

## 2020-04-16 LAB — CBC WITH DIFFERENTIAL (CANCER CENTER ONLY)
Abs Immature Granulocytes: 0.01 10*3/uL (ref 0.00–0.07)
Basophils Absolute: 0 10*3/uL (ref 0.0–0.1)
Basophils Relative: 1 %
Eosinophils Absolute: 0.2 10*3/uL (ref 0.0–0.5)
Eosinophils Relative: 3 %
HCT: 35.7 % — ABNORMAL LOW (ref 36.0–46.0)
Hemoglobin: 11.5 g/dL — ABNORMAL LOW (ref 12.0–15.0)
Immature Granulocytes: 0 %
Lymphocytes Relative: 22 %
Lymphs Abs: 1.5 10*3/uL (ref 0.7–4.0)
MCH: 29.2 pg (ref 26.0–34.0)
MCHC: 32.2 g/dL (ref 30.0–36.0)
MCV: 90.6 fL (ref 80.0–100.0)
Monocytes Absolute: 0.7 10*3/uL (ref 0.1–1.0)
Monocytes Relative: 10 %
Neutro Abs: 4.2 10*3/uL (ref 1.7–7.7)
Neutrophils Relative %: 64 %
Platelet Count: 192 10*3/uL (ref 150–400)
RBC: 3.94 MIL/uL (ref 3.87–5.11)
RDW: 13.8 % (ref 11.5–15.5)
WBC Count: 6.7 10*3/uL (ref 4.0–10.5)
nRBC: 0 % (ref 0.0–0.2)

## 2020-04-16 LAB — CMP (CANCER CENTER ONLY)
ALT: 26 U/L (ref 0–44)
AST: 19 U/L (ref 15–41)
Albumin: 3.8 g/dL (ref 3.5–5.0)
Alkaline Phosphatase: 114 U/L (ref 38–126)
Anion gap: 8 (ref 5–15)
BUN: 26 mg/dL — ABNORMAL HIGH (ref 8–23)
CO2: 27 mmol/L (ref 22–32)
Calcium: 9.9 mg/dL (ref 8.9–10.3)
Chloride: 105 mmol/L (ref 98–111)
Creatinine: 0.97 mg/dL (ref 0.44–1.00)
GFR, Est AFR Am: >60 mL/min (ref >60)
GFR, Estimated: >60 mL/min (ref >60)
Glucose, Bld: 123 mg/dL — ABNORMAL HIGH (ref 70–99)
Potassium: 4.2 mmol/L (ref 3.5–5.1)
Sodium: 140 mmol/L (ref 135–145)
Total Bilirubin: 1 mg/dL (ref 0.3–1.2)
Total Protein: 7.2 g/dL (ref 6.5–8.1)

## 2020-04-16 MED ORDER — HEPARIN SOD (PORK) LOCK FLUSH 100 UNIT/ML IV SOLN
500.0000 [IU] | Freq: Once | INTRAVENOUS | Status: AC | PRN
  Administered 2020-04-16: 500 [IU]
  Filled 2020-04-16: qty 5

## 2020-04-16 MED ORDER — DIPHENHYDRAMINE HCL 25 MG PO CAPS
50.0000 mg | ORAL_CAPSULE | Freq: Once | ORAL | Status: AC
  Administered 2020-04-16: 50 mg via ORAL

## 2020-04-16 MED ORDER — ACETAMINOPHEN 325 MG PO TABS
ORAL_TABLET | ORAL | Status: AC
  Filled 2020-04-16: qty 2

## 2020-04-16 MED ORDER — SODIUM CHLORIDE 0.9% FLUSH
10.0000 mL | Freq: Once | INTRAVENOUS | Status: AC
  Administered 2020-04-16: 10 mL
  Filled 2020-04-16: qty 10

## 2020-04-16 MED ORDER — SODIUM CHLORIDE 0.9 % IV SOLN
Freq: Once | INTRAVENOUS | Status: AC
  Filled 2020-04-16: qty 250

## 2020-04-16 MED ORDER — DIPHENHYDRAMINE HCL 25 MG PO CAPS
ORAL_CAPSULE | ORAL | Status: AC
  Filled 2020-04-16: qty 2

## 2020-04-16 MED ORDER — TRASTUZUMAB-ANNS CHEMO 150 MG IV SOLR
600.0000 mg | Freq: Once | INTRAVENOUS | Status: AC
  Administered 2020-04-16: 600 mg via INTRAVENOUS
  Filled 2020-04-16: qty 28.57

## 2020-04-16 MED ORDER — SODIUM CHLORIDE 0.9% FLUSH
10.0000 mL | INTRAVENOUS | Status: DC | PRN
  Administered 2020-04-16: 10 mL
  Filled 2020-04-16: qty 10

## 2020-04-16 MED ORDER — ACETAMINOPHEN 325 MG PO TABS
650.0000 mg | ORAL_TABLET | Freq: Once | ORAL | Status: AC
  Administered 2020-04-16: 650 mg via ORAL

## 2020-04-16 NOTE — Patient Instructions (Signed)
[facility] Discharge Instructions for Patients Receiving Chemotherapy  Today you received the following chemotherapy agents: trastuzumab-anns  To help prevent nausea and vomiting after your treatment, we encourage you to take your nausea medication as directed.   If you develop nausea and vomiting that is not controlled by your nausea medication, call the clinic.   BELOW ARE SYMPTOMS THAT SHOULD BE REPORTED IMMEDIATELY:  *FEVER GREATER THAN 100.5 F  *CHILLS WITH OR WITHOUT FEVER  NAUSEA AND VOMITING THAT IS NOT CONTROLLED WITH YOUR NAUSEA MEDICATION  *UNUSUAL SHORTNESS OF BREATH  *UNUSUAL BRUISING OR BLEEDING  TENDERNESS IN MOUTH AND THROAT WITH OR WITHOUT PRESENCE OF ULCERS  *URINARY PROBLEMS  *BOWEL PROBLEMS  UNUSUAL RASH Items with * indicate a potential emergency and should be followed up as soon as possible.  Feel free to call the clinic should you have any questions or concerns. The clinic phone number is [phone].  Please show the CHEMO ALERT CARD at check-in to the Emergency Department and triage nurse.   

## 2020-04-16 NOTE — Patient Instructions (Signed)

## 2020-04-17 ENCOUNTER — Ambulatory Visit: Payer: Medicare Other | Admitting: Physical Therapy

## 2020-04-17 ENCOUNTER — Encounter: Payer: Self-pay | Admitting: Physical Therapy

## 2020-04-17 DIAGNOSIS — R293 Abnormal posture: Secondary | ICD-10-CM

## 2020-04-17 DIAGNOSIS — R262 Difficulty in walking, not elsewhere classified: Secondary | ICD-10-CM

## 2020-04-17 DIAGNOSIS — M6281 Muscle weakness (generalized): Secondary | ICD-10-CM

## 2020-04-17 DIAGNOSIS — I890 Lymphedema, not elsewhere classified: Secondary | ICD-10-CM

## 2020-04-17 DIAGNOSIS — M25611 Stiffness of right shoulder, not elsewhere classified: Secondary | ICD-10-CM

## 2020-04-17 DIAGNOSIS — M79621 Pain in right upper arm: Secondary | ICD-10-CM

## 2020-04-17 NOTE — Therapy (Signed)
[place], [name], [address] Phone: [phone]   Fax:  [phone]  Physical Therapy Treatment  Patient Details  Name: Kathy Reese MRN: [id number] Date of Birth: [birth year] Referring Provider (PT): Kinard   Encounter Date: 04/17/2020   PT End of Session - 04/17/20 1252    Visit Number 13    Number of Visits 17    Date for PT Re-Evaluation 05/07/20    PT Start Time 1200    PT Stop Time 1245    PT Time Calculation (min) 45 min    Activity Tolerance Patient tolerated treatment well    Behavior During Therapy [facility] for tasks assessed/performed           Past Medical History:  Diagnosis Date  . Allergic rhinitis, cause unspecified   . Arthritis   . Depression   . Diverticulosis   . History of Bell's palsy   . History of diabetes mellitus   . Hypersomnia   . OSA on CPAP    not using at this time 06/15/19  . Other and unspecified hyperlipidemia   . Personal history of chemotherapy   . Personal history of malignant neoplasm of ovary   . Personal history of radiation therapy   . Type II or unspecified type diabetes mellitus without mention of complication, not stated as uncontrolled   . Unspecified essential hypertension   . Vertigo     Past Surgical History:  Procedure Laterality Date  . ABDOMINAL ADHESION SURGERY  12/96   exc peritoneal cyst  . BREAST BIOPSY Left 2000   stereotactic, negative  . BREAST BIOPSY Right 05/08/2019  . BREAST LUMPECTOMY Right 05/30/2019  . BREAST LUMPECTOMY WITH RADIOACTIVE SEED AND SENTINEL LYMPH NODE BIOPSY Right 05/30/2019   Procedure: RIGHT BREAST LUMPECTOMY WITH BRACKETED RADIOACTIVE SEEDS  AND SENTINEL LYMPH NODE BIOPSY;  Surgeon: Stark Klein, MD;  Location: [place];  Service: General;  Laterality: Right;  . BREAST REDUCTION SURGERY Bilateral 10/03  . EYE SURGERY Left    cataracts  . LAPAROSCOPIC SALPINGO OOPHERECTOMY Right 12/96  . 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 LAPAROSCOPY ABDOMEN DIAGNOSTIC  2/08   w/LOA, LSO  . PORTACATH PLACEMENT Left 06/16/2019   Procedure: INSERTION PORT-A-CATH;  Surgeon: Stark Klein, MD;  Location: [place];  Service: General;  Laterality: Left;  . REDUCTION MAMMAPLASTY Bilateral 2004  . TONSILLECTOMY    . TOTAL ABDOMINAL HYSTERECTOMY W/ BILATERAL SALPINGOOPHORECTOMY  5/94   secondary to uterine fibroids  . TOTAL KNEE ARTHROPLASTY Right 05/12/2013   Procedure: TOTAL KNEE ARTHROPLASTY;  Surgeon: Alta Corning, MD;  Location: [place];  Service: Orthopedics;  Laterality: Right;  . TRIGGER FINGER RELEASE  07/06/2012   Procedure: RELEASE TRIGGER FINGER/A-1 PULLEY;  Surgeon: Alta Corning, MD;  Location: [place];  Service: Orthopedics;  Laterality: Left;  left ring finger  . TUBAL LIGATION      There were no vitals filed for this visit.   Subjective Assessment - 04/17/20 1205    Subjective Pt states she is doing good. The compression bra has helped her but she still has a little fullness in the upper medial portion of the breast. She wears her compression bra when she is active at home and knows how to do self manual lymph drainage    Pertinent History R breast cancer, s/p R lumpectomy and SLNB on 05/30/19, 3 nodes removed all negative,  currently undergoing chemo, pt will begin radiation after chemo    Patient Stated Goals be able to move my arm, soften the areas that are hard in the breast. 04/17/2020 : pt feels that she has accomplished this    Currently in Pain? No/denies                             [facility] Adult PT Treatment/Exercise - 04/17/20 0001      Manual Therapy   Manual Therapy Myofascial release;Manual Lymphatic Drainage (MLD)    Manual therapy comments gave pt small dotted foam on cellona to wear insider bra at full area on breast     Soft tissue mobilization  in  left sidelying to  right axilla and posterior  scapular muslces     Myofascial Release to R axilla, inferior/lateral R breast, cross hand along the lateral breast, anterior breast, medial proxima. brachium to axilla and then to lateral breast, then myofascial stabilization under the R lateral breast with abduction and flexion.     Manual Lymphatic Drainage (MLD) short neck on right side  superficial and deep abdominals, Lt axillary nodes and establishment of anterior interaxillary pathway, right inguinal nodes and establishment of Rt axillo-inguinal pathway, right breast moving fluid towards pathways; then reworked all surfaces verbally reviewed self MLD                        PT Long Term Goals - 04/17/20 1207      PT LONG TERM GOAL #1   Title Pt will demonstrate 155 degrees of R shoulder flexion to allow her to reach overhead.    Baseline R 126, 04/09/20- 122, 04/17/2020: 158    Status Achieved      PT LONG TERM GOAL #2   Title Pt will demonstrates 160 degrees of R shoulder abduction to allow her to reach out to the side.    Baseline 114, 04/09/20- 123. 04/17/2020:165    Status Achieved      PT LONG TERM GOAL #3   Title Pt will be able to lift R arm above head without being limited by tightness and discomfort in R axilla.    Baseline 04/09/20- pt reports she can reach up higher before the discomfort in the axilla starts 04/17/2020 Pt states she can do this and she practices at home    Status Achieved      PT LONG TERM GOAL #4   Title Pt will be independent in a home exercise program for continued strengthening and stretching.    Baseline 04/09/20- still adding exercises, 04/17/2020:  pt states she knows she needs to continue to doing her exercises at home    Status Achieved                 Plan - 04/17/20 1252    Clinical Impression Statement Pt is doing very well with shoulder ROM and has achieved those goals.  She is still having some fullness in right breast so upgraded foam pad today and reviewed self MLD and  stretches.  Pt still having some trouble with nueropathy so gave her information from [employer] about [place] and yoga for feet class on July 29.  Pt will try to get her daughter to help her with the zoom classes.She is almost ready for discharge but wants to come back in about 2 weeks for one more check up prior to discharge  Personal Factors and Comorbidities Comorbidity 1;Comorbidity 2    Comorbidities diabetes, ovarian cancer,    Examination-Participation Restrictions Cleaning;Meal Prep;Laundry;Yard Work    Stability/Clinical Decision Making Evolving/Moderate complexity    PT Duration 4 weeks    PT Treatment/Interventions ADLs/Self Care Home Management;Therapeutic activities;Therapeutic exercise;Patient/family education;Manual lymph drainage;Manual techniques;Compression bandaging;Scar mobilization;Passive range of motion;Taping;Joint Manipulations    PT Next Visit Plan Recheck, consider discharge.  MFR to R axilla, ROM R shoulder, cont pulleys and ball roll up wall update HEP for stretching and strengthening    PT Home Exercise Plan supine dowel exercises, self MLD; supine scapular series    Consulted and Agree with Plan of Care Patient           Patient will benefit from skilled therapeutic intervention in order to improve the following deficits and impairments:  Decreased range of motion, Decreased scar mobility, Increased edema, Decreased knowledge of precautions, Decreased strength, Increased fascial restricitons, Impaired UE functional use, Postural dysfunction, Pain  Visit Diagnosis: Stiffness of right shoulder, not elsewhere classified  Abnormal posture  Lymphedema, not elsewhere classified  Muscle weakness (generalized)  Difficulty in walking, not elsewhere classified  Pain in right upper arm     Problem List Patient Active Problem List   Diagnosis Date Noted  . Chemotherapy-induced peripheral neuropathy ([place]) 03/26/2020  . Port-A-Cath in place  07/11/2019  . Malignant neoplasm of upper-inner quadrant of right breast in female, estrogen receptor positive ([place]) 04/19/2019  . Facial weakness 11/07/2018  . 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 History of hysterectomy 10/12/2018  . History of bilateral salpingo-oophorectomy (BSO) 10/12/2018  . Osteoarthritis of right knee 05/12/2013  . HYPERLIPIDEMIA 12/30/2007  . HYPERTENSION 12/30/2007  . INSOMNIA 12/30/2007  . HYPERSOMNIA 12/30/2007  . DM w/o Complication Type II 86/76/7209  . HYPERCHOLESTEROLEMIA 12/16/2007  . EXOGENOUS OBESITY 12/16/2007  . SLEEP APNEA, OBSTRUCTIVE 12/16/2007  . ALLERGIC RHINITIS 12/16/2007   Donato Heinz. Owens Shark PT  Norwood Levo 04/17/2020, 12:56 PM  [place], [name], [address] Phone: [phone]   Fax:  [phone]  Name: Lilyona Richner MRN: [id number] Date of Birth: May 28, 1951

## 2020-04-22 ENCOUNTER — Other Ambulatory Visit: Payer: Self-pay

## 2020-04-22 ENCOUNTER — Ambulatory Visit: Payer: Medicare Other

## 2020-04-22 DIAGNOSIS — M25611 Stiffness of right shoulder, not elsewhere classified: Secondary | ICD-10-CM

## 2020-04-22 DIAGNOSIS — M6281 Muscle weakness (generalized): Secondary | ICD-10-CM

## 2020-04-22 DIAGNOSIS — I890 Lymphedema, not elsewhere classified: Secondary | ICD-10-CM

## 2020-04-22 DIAGNOSIS — R293 Abnormal posture: Secondary | ICD-10-CM

## 2020-04-22 NOTE — Therapy (Signed)
[place], [name], [address] Phone: [phone]   Fax:  [phone]  Physical Therapy Treatment  Patient Details  Name: Kathy Reese MRN: [id number] Date of Birth: 1950/12/20 Referring Provider (PT): Kinard   Encounter Date: 04/22/2020   PT End of Session - 04/22/20 1004    Visit Number 14    Number of Visits 17    Date for PT Re-Evaluation 05/07/20    PT Start Time 0916   pt arrived late   PT Stop Time 1004    PT Time Calculation (min) 48 min    Activity Tolerance Patient tolerated treatment well    Behavior During Therapy [facility] for tasks assessed/performed           Past Medical History:  Diagnosis Date  . Allergic rhinitis, cause unspecified   . Arthritis   . Depression   . Diverticulosis   . History of Bell's palsy   . History of diabetes mellitus   . Hypersomnia   . OSA on CPAP    not using at this time 06/15/19  . Other and unspecified hyperlipidemia   . Personal history of chemotherapy   . Personal history of malignant neoplasm of ovary   . Personal history of radiation therapy   . Type II or unspecified type diabetes mellitus without mention of complication, not stated as uncontrolled   . Unspecified essential hypertension   . Vertigo     Past Surgical History:  Procedure Laterality Date  . ABDOMINAL ADHESION SURGERY  12/96   exc peritoneal cyst  . BREAST BIOPSY Left 2000   stereotactic, negative  . BREAST BIOPSY Right 05/08/2019  . BREAST LUMPECTOMY Right 05/30/2019  . BREAST LUMPECTOMY WITH RADIOACTIVE SEED AND SENTINEL LYMPH NODE BIOPSY Right 05/30/2019   Procedure: RIGHT BREAST LUMPECTOMY WITH BRACKETED RADIOACTIVE SEEDS  AND SENTINEL LYMPH NODE BIOPSY;  Surgeon: Stark Klein, MD;  Location: [place];  Service: General;  Laterality: Right;  . BREAST REDUCTION SURGERY Bilateral 10/03  . EYE SURGERY Left    cataracts  . LAPAROSCOPIC SALPINGO OOPHERECTOMY Right 12/96    . 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 LAPAROSCOPY ABDOMEN DIAGNOSTIC  2/08   w/LOA, LSO  . PORTACATH PLACEMENT Left 06/16/2019   Procedure: INSERTION PORT-A-CATH;  Surgeon: Stark Klein, MD;  Location: [place];  Service: General;  Laterality: Left;  . REDUCTION MAMMAPLASTY Bilateral 2004  . TONSILLECTOMY    . TOTAL ABDOMINAL HYSTERECTOMY W/ BILATERAL SALPINGOOPHORECTOMY  5/94   secondary to uterine fibroids  . TOTAL KNEE ARTHROPLASTY Right 05/12/2013   Procedure: TOTAL KNEE ARTHROPLASTY;  Surgeon: Alta Corning, MD;  Location: [place];  Service: Orthopedics;  Laterality: Right;  . TRIGGER FINGER RELEASE  07/06/2012   Procedure: RELEASE TRIGGER FINGER/A-1 PULLEY;  Surgeon: Alta Corning, MD;  Location: [place];  Service: Orthopedics;  Laterality: Left;  left ring finger  . TUBAL LIGATION      There were no vitals filed for this visit.   Subjective Assessment - 04/22/20 0925    Subjective I'm doing really well overall. I can reach higher with less pulling from my axilla to my breast. The fullness at my superior breast is still there but improving and I am wearing my compression bra and foam as well. I want to cancel my other appt this week and just come next week and end then.    Pertinent History  R breast cancer, s/p R lumpectomy and SLNB on 05/30/19, 3 nodes removed all negative, currently undergoing chemo, pt will begin radiation after chemo    Patient Stated Goals be able to move my arm, soften the areas that are hard in the breast. 04/17/2020 : pt feels that she has accomplished this    Currently in Pain? No/denies                             [facility] Adult PT Treatment/Exercise - 04/22/20 0001      Manual Therapy   Myofascial Release to R axilla, inferior/lateral R breast, cross hand along the lateral breast, anterior breast, medial proximal, brachium to axilla and then to lateral breast, then myofascial  stabilization under the R lateral breast with abduction and flexion.     Manual Lymphatic Drainage (MLD) short neck on right side  superficial and deep abdominals, Lt axillary nodes and establishment of anterior interaxillary pathway, right inguinal nodes and establishment of Rt axillo-inguinal pathway, right breast moving fluid towards pathways; then reworked all surfaces verbally reviewed self MLD     Passive ROM into flexion/abduction with intermittent myofascial release                        PT Long Term Goals - 04/17/20 1207      PT LONG TERM GOAL #1   Title Pt will demonstrate 155 degrees of R shoulder flexion to allow her to reach overhead.    Baseline R 126, 04/09/20- 122, 04/17/2020: 158    Status Achieved      PT LONG TERM GOAL #2   Title Pt will demonstrates 160 degrees of R shoulder abduction to allow her to reach out to the side.    Baseline 114, 04/09/20- 123. 04/17/2020:165    Status Achieved      PT LONG TERM GOAL #3   Title Pt will be able to lift R arm above head without being limited by tightness and discomfort in R axilla.    Baseline 04/09/20- pt reports she can reach up higher before the discomfort in the axilla starts 04/17/2020 Pt states she can do this and she practices at home    Status Achieved      PT LONG TERM GOAL #4   Title Pt will be independent in a home exercise program for continued strengthening and stretching.    Baseline 04/09/20- still adding exercises, 04/17/2020:  pt states she knows she needs to continue to doing her exercises at home    Status Achieved                 Plan - 04/22/20 1005    Clinical Impression Statement Continued with focus on maual therapy working to progress her end ROM and decreasing myofascial tightness at end motions. Also continued with manual lymph drainage of Rt breast. The superior aspect has greatly improved since htis therapist last saw here, just smaller area of fibrosis still palpable. She is wearing new  foam pad reporting goodbenefit from this. After discussion of current progress pt would like to cancel this weeks other appt and come for final assess next week.    Personal Factors and Comorbidities Comorbidity 1;Comorbidity 2    Comorbidities diabetes, ovarian cancer,    Examination-Activity Limitations Reach Overhead;Carry;Lift    Examination-Participation Restrictions Cleaning;Meal Prep;Laundry;Yard Work    Research scientist (physical sciences)  PT Frequency 2x / week    PT Duration 4 weeks    PT Treatment/Interventions ADLs/Self Care Home Management;Therapeutic activities;Therapeutic exercise;Patient/family education;Manual lymph drainage;Manual techniques;Compression bandaging;Scar mobilization;Passive range of motion;Taping;Joint Manipulations    PT Next Visit Plan Recheck, consider discharge (though pt reports wanting to finish last 2 appts next week).  MFR to R axilla, ROM R shoulder, cont pulleys and ball roll up wall update HEP for stretching and strengthening    PT Home Exercise Plan supine dowel exercises, self MLD; supine scapular series    Consulted and Agree with Plan of Care Patient           Patient will benefit from skilled therapeutic intervention in order to improve the following deficits and impairments:  Decreased range of motion, Decreased scar mobility, Increased edema, Decreased knowledge of precautions, Decreased strength, Increased fascial restricitons, Impaired UE functional use, Postural dysfunction, Pain  Visit Diagnosis: Stiffness of right shoulder, not elsewhere classified  Abnormal posture  Lymphedema, not elsewhere classified  Muscle weakness (generalized)     Problem List Patient Active Problem List   Diagnosis Date Noted  . Chemotherapy-induced peripheral neuropathy ([place]) 03/26/2020  . Port-A-Cath in place 07/11/2019  . Malignant neoplasm of upper-inner quadrant of right breast in  female, estrogen receptor positive ([place]) 04/19/2019  . Facial weakness 11/07/2018  . 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 History of hysterectomy 10/12/2018  . History of bilateral salpingo-oophorectomy (BSO) 10/12/2018  . Osteoarthritis of right knee 05/12/2013  . HYPERLIPIDEMIA 12/30/2007  . HYPERTENSION 12/30/2007  . INSOMNIA 12/30/2007  . HYPERSOMNIA 12/30/2007  . DM w/o Complication Type II 53/79/4327  . HYPERCHOLESTEROLEMIA 12/16/2007  . EXOGENOUS OBESITY 12/16/2007  . SLEEP APNEA, OBSTRUCTIVE 12/16/2007  . ALLERGIC RHINITIS 12/16/2007    Otelia Limes, PTA 04/22/2020, 10:16 AM  [place], [name], [address] Phone: [phone]   Fax:  [phone]  Name: Elta Angell MRN: [id number] Date of Birth: 1951-06-23

## 2020-04-29 ENCOUNTER — Ambulatory Visit: Payer: Medicare Other | Admitting: Physical Therapy

## 2020-04-29 ENCOUNTER — Other Ambulatory Visit: Payer: Self-pay

## 2020-04-29 DIAGNOSIS — M79621 Pain in right upper arm: Secondary | ICD-10-CM

## 2020-04-29 DIAGNOSIS — I890 Lymphedema, not elsewhere classified: Secondary | ICD-10-CM

## 2020-04-29 DIAGNOSIS — R293 Abnormal posture: Secondary | ICD-10-CM

## 2020-04-29 DIAGNOSIS — M6281 Muscle weakness (generalized): Secondary | ICD-10-CM

## 2020-04-29 DIAGNOSIS — M25611 Stiffness of right shoulder, not elsewhere classified: Secondary | ICD-10-CM

## 2020-04-29 NOTE — Patient Instructions (Signed)

## 2020-04-29 NOTE — Therapy (Signed)
[place], [name], [address] Phone: [phone]   Fax:  [phone]  Physical Therapy Treatment  Patient Details  Name: Kathy Reese MRN: [id number] Date of Birth: [birth year] Referring Provider (PT): Kinard   Encounter Date: 04/29/2020   PT End of Session - 04/29/20 0839    Visit Number 15    Number of Visits 17    Date for PT Re-Evaluation 05/07/20    PT Start Time 0800    PT Stop Time 0841    PT Time Calculation (min) 41 min    Activity Tolerance Patient tolerated treatment well    Behavior During Therapy [facility] for tasks assessed/performed           Past Medical History:  Diagnosis Date  . Allergic rhinitis, cause unspecified   . Arthritis   . Depression   . Diverticulosis   . History of Bell's palsy   . History of diabetes mellitus   . Hypersomnia   . OSA on CPAP    not using at this time 06/15/19  . Other and unspecified hyperlipidemia   . Personal history of chemotherapy   . Personal history of malignant neoplasm of ovary   . Personal history of radiation therapy   . Type II or unspecified type diabetes mellitus without mention of complication, not stated as uncontrolled   . Unspecified essential hypertension   . Vertigo     Past Surgical History:  Procedure Laterality Date  . ABDOMINAL ADHESION SURGERY  12/96   exc peritoneal cyst  . BREAST BIOPSY Left 2000   stereotactic, negative  . BREAST BIOPSY Right 05/08/2019  . BREAST LUMPECTOMY Right 05/30/2019  . BREAST LUMPECTOMY WITH RADIOACTIVE SEED AND SENTINEL LYMPH NODE BIOPSY Right 05/30/2019   Procedure: RIGHT BREAST LUMPECTOMY WITH BRACKETED RADIOACTIVE SEEDS  AND SENTINEL LYMPH NODE BIOPSY;  Surgeon: Stark Klein, MD;  Location: [place];  Service: General;  Laterality: Right;  . BREAST REDUCTION SURGERY Bilateral 10/03  . EYE SURGERY Left    cataracts  . LAPAROSCOPIC SALPINGO OOPHERECTOMY Right 12/96  . 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 LAPAROSCOPY ABDOMEN DIAGNOSTIC  2/08   w/LOA, LSO  . PORTACATH PLACEMENT Left 06/16/2019   Procedure: INSERTION PORT-A-CATH;  Surgeon: Stark Klein, MD;  Location: [facility];  Service: General;  Laterality: Left;  . REDUCTION MAMMAPLASTY Bilateral 2004  . TONSILLECTOMY    . TOTAL ABDOMINAL HYSTERECTOMY W/ BILATERAL SALPINGOOPHORECTOMY  5/94   secondary to uterine fibroids  . TOTAL KNEE ARTHROPLASTY Right 05/12/2013   Procedure: TOTAL KNEE ARTHROPLASTY;  Surgeon: Alta Corning, MD;  Location: [facility];  Service: Orthopedics;  Laterality: Right;  . TRIGGER FINGER RELEASE  07/06/2012   Procedure: RELEASE TRIGGER FINGER/A-1 PULLEY;  Surgeon: Alta Corning, MD;  Location: [facility];  Service: Orthopedics;  Laterality: Left;  left ring finger  . TUBAL LIGATION      There were no vitals filed for this visit.   Subjective Assessment - 04/29/20 0806    Subjective Pt comes in today still wearing post op shoe becuse she still has pain when she puts her shoe on.  She is doing well and was able to mow her yard this past weekend. She is ready for discharge    Pertinent History R breast cancer, s/p R lumpectomy and SLNB on 05/30/19, 3 nodes removed all negative, currently undergoing chemo, pt will begin radiation  after chemo    Patient Stated Goals be able to move my arm, soften the areas that are hard in the breast. 04/17/2020 : pt feels that she has accomplished this    Currently in Pain? No/denies                             [facility] Adult PT Treatment/Exercise - 04/29/20 0001      Exercises   Exercises Shoulder;Other Exercises    Other Exercises  neck , upper thoracic and shoulder stretches with deep breathing to arm up       Shoulder Exercises: Standing   Flexion AROM;Right;Left;5 reps   stretches with dowel    ABduction AAROM;Right;Left;5 reps   stretches with dowel    Extension  AAROM;Right;Left;5 reps   stretches with dowel      Manual Therapy   Myofascial Release to R axilla, inferior/lateral R breast, cross hand along the lateral breast, anterior breast, medial proximal, brachium to axilla and then to lateral breast, then myofascial stabilization under the R lateral breast with abduction and flexion.     Manual Lymphatic Drainage (MLD) short neck on right side  superficial and deep abdominals, Lt axillary nodes and establishment of anterior interaxillary pathway, right inguinal nodes and establishment of Rt axillo-inguinal pathway, right breast moving fluid towards pathways; then reworked all surfaces verbally reviewed self MLD     Passive ROM into flexion/abduction with intermittent myofascial release                   PT Education - 04/29/20 0847    Education Details standing dowel shoulder stretching    Person(s) Educated Patient    Methods Explanation;Demonstration;Handout    Comprehension Verbalized understanding;Returned demonstration               PT Long Term Goals - 04/29/20 0848      PT LONG TERM GOAL #1   Title Pt will demonstrate 155 degrees of R shoulder flexion to allow her to reach overhead.    Baseline R 126, 04/09/20- 122, 04/17/2020: 158    Status Achieved      PT LONG TERM GOAL #2   Title Pt will demonstrates 160 degrees of R shoulder abduction to allow her to reach out to the side.    Baseline 114, 04/09/20- 123. 04/17/2020:165    Status Achieved      PT LONG TERM GOAL #3   Title Pt will be able to lift R arm above head without being limited by tightness and discomfort in R axilla.    Baseline 04/09/20- pt reports she can reach up higher before the discomfort in the axilla starts 04/17/2020 Pt states she can do this and she practices at home    Status Achieved      PT LONG TERM GOAL #4   Title Pt will be independent in a home exercise program for continued strengthening and stretching.    Status Achieved      PT LONG TERM GOAL  #5   Title Pt will report she is confident she can continue to manage her symptoms on her own at home with exercise and use of compressin    Time 1    Period Weeks    Status New                 Plan - 04/29/20 0844    Clinical Impression Statement Pt continues to make progress but she  does have persistent tightness in right lateral chest and posterior axilla at lats and teres major.  She received benefit from soft tissue work to this area.  She would like one more session to focus on stretching and soft tissue work to this area.    Comorbidities diabetes, ovarian cancer,    Examination-Activity Limitations Reach Overhead;Carry;Lift    Examination-Participation Restrictions Cleaning;Meal Prep;Laundry;Yard Work    [employer]chant [employer] officer Evolving/Moderate complexity    Rehab Potential Excellent    PT Frequency 2x / week    PT Duration 4 weeks    PT Treatment/Interventions ADLs/Self Care Home Management;Therapeutic activities;Therapeutic exercise;Patient/family education;Manual lymph drainage;Manual techniques;Compression bandaging;Scar mobilization;Passive range of motion;Taping;Joint Manipulations    PT Next Visit Plan stretching to warm up ( pulleys, ball on wall with deep breathing for rib expansion, standing dowel )   MFR to R axilla, ROM R shoulder,  discharge    PT Home Exercise Plan supine dowel exercises, self MLD; supine scapular series, standing dowel series    Consulted and Agree with Plan of Care Patient           Patient will benefit from skilled therapeutic intervention in order to improve the following deficits and impairments:  Decreased range of motion, Decreased scar mobility, Increased edema, Decreased knowledge of precautions, Decreased strength, Increased fascial restricitons, Impaired UE functional use, Postural dysfunction, Pain  Visit Diagnosis: Stiffness of right shoulder, not elsewhere classified  Abnormal posture  Lymphedema, not elsewhere  classified  Muscle weakness (generalized)  Pain in right upper arm     Problem List Patient Active Problem List   Diagnosis Date Noted  . Chemotherapy-induced peripheral neuropathy ([place]) 03/26/2020  . Port-A-Cath in place 07/11/2019  . Malignant neoplasm of upper-inner quadrant of right breast in female, estrogen receptor positive ([place]) 04/19/2019  . Facial weakness 11/07/2018  . 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 History of hysterectomy 10/12/2018  . History of bilateral salpingo-oophorectomy (BSO) 10/12/2018  . Osteoarthritis of right knee 05/12/2013  . HYPERLIPIDEMIA 12/30/2007  . HYPERTENSION 12/30/2007  . INSOMNIA 12/30/2007  . HYPERSOMNIA 12/30/2007  . DM w/o Complication Type II 46/80/3212  . HYPERCHOLESTEROLEMIA 12/16/2007  . EXOGENOUS OBESITY 12/16/2007  . SLEEP APNEA, OBSTRUCTIVE 12/16/2007  . ALLERGIC RHINITIS 12/16/2007   Donato Heinz. Owens Shark PT  Norwood Levo 04/29/2020, 8:50 AM  [place], [name], [address] Phone: [phone]   Fax:  [phone]  Name: Kathy Reese MRN: [id number] Date of Birth: 01/20/1951

## 2020-05-01 ENCOUNTER — Other Ambulatory Visit: Payer: Self-pay

## 2020-05-01 ENCOUNTER — Ambulatory Visit: Payer: Medicare Other

## 2020-05-01 DIAGNOSIS — M6281 Muscle weakness (generalized): Secondary | ICD-10-CM

## 2020-05-01 DIAGNOSIS — I890 Lymphedema, not elsewhere classified: Secondary | ICD-10-CM

## 2020-05-01 DIAGNOSIS — M25611 Stiffness of right shoulder, not elsewhere classified: Secondary | ICD-10-CM

## 2020-05-01 DIAGNOSIS — R293 Abnormal posture: Secondary | ICD-10-CM

## 2020-05-01 NOTE — Patient Instructions (Signed)

## 2020-05-01 NOTE — Therapy (Addendum)
[place], [name], [address] Phone: [phone]   Fax:  [phone]  Physical Therapy Treatment  Patient Details  Name: Kathy Reese MRN: [id number] Date of Birth: [birth year] Referring Provider (PT): Kinard   Encounter Date: 05/01/2020   PT End of Session - 05/01/20 0941    Visit Number 16    Number of Visits 17    Date for PT Re-Evaluation 05/07/20    PT Start Time 0805    PT Stop Time 0902    PT Time Calculation (min) 57 min    Activity Tolerance Patient tolerated treatment well    Behavior During Therapy [employer] [facility] [facility] for tasks assessed/performed           Past Medical History:  Diagnosis Date  . Allergic rhinitis, cause unspecified   . Arthritis   . Depression   . Diverticulosis   . History of Bell's palsy   . History of diabetes mellitus   . Hypersomnia   . OSA on CPAP    not using at this time 06/15/19  . Other and unspecified hyperlipidemia   . Personal history of chemotherapy   . Personal history of malignant neoplasm of ovary   . Personal history of radiation therapy   . Type II or unspecified type diabetes mellitus without mention of complication, not stated as uncontrolled   . Unspecified essential hypertension   . Vertigo     Past Surgical History:  Procedure Laterality Date  . ABDOMINAL ADHESION SURGERY  12/96   exc peritoneal cyst  . BREAST BIOPSY Left 2000   stereotactic, negative  . BREAST BIOPSY Right 05/08/2019  . BREAST LUMPECTOMY Right 05/30/2019  . BREAST LUMPECTOMY WITH RADIOACTIVE SEED AND SENTINEL LYMPH NODE BIOPSY Right 05/30/2019   Procedure: RIGHT BREAST LUMPECTOMY WITH BRACKETED RADIOACTIVE SEEDS  AND SENTINEL LYMPH NODE BIOPSY;  Surgeon: Stark Klein, MD;  Location: [facility];  Service: General;  Laterality: Right;  . BREAST REDUCTION SURGERY Bilateral 10/03  . EYE SURGERY Left    cataracts  . LAPAROSCOPIC SALPINGO OOPHERECTOMY Right 12/96  . 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 LAPAROSCOPY ABDOMEN DIAGNOSTIC  2/08   w/LOA, LSO  . PORTACATH PLACEMENT Left 06/16/2019   Procedure: INSERTION PORT-A-CATH;  Surgeon: Stark Klein, MD;  Location: [facility];  Service: General;  Laterality: Left;  . REDUCTION MAMMAPLASTY Bilateral 2004  . TONSILLECTOMY    . TOTAL ABDOMINAL HYSTERECTOMY W/ BILATERAL SALPINGOOPHORECTOMY  5/94   secondary to uterine fibroids  . TOTAL KNEE ARTHROPLASTY Right 05/12/2013   Procedure: TOTAL KNEE ARTHROPLASTY;  Surgeon: Alta Corning, MD;  Location: [facility];  Service: Orthopedics;  Laterality: Right;  . TRIGGER FINGER RELEASE  07/06/2012   Procedure: RELEASE TRIGGER FINGER/A-1 PULLEY;  Surgeon: Alta Corning, MD;  Location: [facility];  Service: Orthopedics;  Laterality: Left;  left ring finger  . TUBAL LIGATION      There were no vitals filed for this visit.   Subjective Assessment - 05/01/20 0811    Subjective My foot is feeling better, I am able to wear my shoe for a bit during the day, just not al day yet. But it's feeling better enough that I am able to start walking again daily too. And overall my breast is doing well and I am ready to D/C.    Pertinent History R breast cancer, s/p R lumpectomy and SLNB on 05/30/19, 3  nodes removed all negative, currently undergoing chemo, pt will begin radiation after chemo    Patient Stated Goals be able to move my arm, soften the areas that are hard in the breast. 04/17/2020 : pt feels that she has accomplished this    Currently in Pain? No/denies                             [facility] Adult PT Treatment/Exercise - 05/01/20 0001      Shoulder Exercises: Standing   Other Standing Exercises Bil UE 3 way raises with 1 lb, 10x each returning hterapist demo with back, shoulders and head against wall and core engaged.      Shoulder Exercises: Pulleys   Flexion 1 minute    ABduction 1 minute    ABduction  Limitations full ROM with both today      Manual Therapy   Myofascial Release To Rt axilla towards upper lateral breast where tightness palpable during P/ROM,     Manual Lymphatic Drainage (MLD) In Supine: short neck being mindful of port, superficial and deep abdominals, Lt axillary nodes and establishment of anterior interaxillary pathway, right inguinal nodes and establishment of Rt axillo-inguinal pathway, right breast moving fluid towards pathways; then reworked all surfaces verbally reviewed self MLD     Passive ROM into flexion/abduction with intermittent myofascial release                   PT Education - 05/01/20 0939    Education Details Standing bil UE 3 way raises    Person(s) Educated Patient    Methods Explanation;Demonstration;Handout    Comprehension Verbalized understanding;Returned demonstration               PT Long Term Goals - 05/01/20 0948      PT LONG TERM GOAL #1   Title Pt will demonstrate 155 degrees of R shoulder flexion to allow her to reach overhead.    Baseline R 126, 04/09/20- 122, 04/17/2020: 158    Status Achieved      PT LONG TERM GOAL #2   Title Pt will demonstrates 160 degrees of R shoulder abduction to allow her to reach out to the side.    Baseline 114, 04/09/20- 123. 04/17/2020:165    Status Achieved      PT LONG TERM GOAL #3   Title Pt will be able to lift R arm above head without being limited by tightness and discomfort in R axilla.    Baseline 04/09/20- pt reports she can reach up higher before the discomfort in the axilla starts 04/17/2020 Pt states she can do this and she practices at home    Status Achieved      PT LONG TERM GOAL #4   Title Pt will be independent in a home exercise program for continued strengthening and stretching.    Baseline 04/09/20- still adding exercises, 04/17/2020:  pt states she knows she needs to continue to doing her exercises at home    Status Achieved      PT LONG TERM GOAL #5   Title Pt will report  she is confident she can continue to manage her symptoms on her own at home with exercise and use of compression    Baseline Pt does report confidence now in being able to self manage lymphedema symptoms at home with self MLD, wear of compression and foam, and how to safely progress exercises-05/01/20    Status Achieved  Plan - 05/01/20 0941    Clinical Impression Statement Pt has done excellent this episode of therapy meeting all goals. She is now ready for D/C.    Personal Factors and Comorbidities Comorbidity 1;Comorbidity 2    Comorbidities diabetes, ovarian cancer,    Examination-Activity Limitations Reach Overhead;Carry;Lift    Examination-Participation Restrictions Cleaning;Meal Prep;Laundry;Yard Work    [employer]chant [employer] officer Evolving/Moderate complexity    Rehab Potential Excellent    PT Frequency 2x / week    PT Duration 4 weeks    PT Treatment/Interventions ADLs/Self Care Home Management;Therapeutic activities;Therapeutic exercise;Patient/family education;Manual lymph drainage;Manual techniques;Compression bandaging;Scar mobilization;Passive range of motion;Taping;Joint Manipulations    PT Next Visit Plan D/C this visit.    PT Home Exercise Plan supine dowel exercises, self MLD; supine scapular series, standing dowel series; bil UE 3 way raises    Consulted and Agree with Plan of Care Patient           Patient will benefit from skilled therapeutic intervention in order to improve the following deficits and impairments:  Decreased range of motion, Decreased scar mobility, Increased edema, Decreased knowledge of precautions, Decreased strength, Increased fascial restricitons, Impaired UE functional use, Postural dysfunction, Pain  Visit Diagnosis: Stiffness of right shoulder, not elsewhere classified  Abnormal posture  Lymphedema, not elsewhere classified  Muscle weakness (generalized)     Problem List Patient Active Problem List    Diagnosis Date Noted  . Chemotherapy-induced peripheral neuropathy ([place]) 03/26/2020  . Port-A-Cath in place 07/11/2019  . Malignant neoplasm of upper-inner quadrant of right breast in female, estrogen receptor positive ([place]) 04/19/2019  . Facial weakness 11/07/2018  . 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 History of hysterectomy 10/12/2018  . History of bilateral salpingo-oophorectomy (BSO) 10/12/2018  . Osteoarthritis of right knee 05/12/2013  . HYPERLIPIDEMIA 12/30/2007  . HYPERTENSION 12/30/2007  . INSOMNIA 12/30/2007  . HYPERSOMNIA 12/30/2007  . DM w/o Complication Type II 06/77/0340  . HYPERCHOLESTEROLEMIA 12/16/2007  . EXOGENOUS OBESITY 12/16/2007  . SLEEP APNEA, OBSTRUCTIVE 12/16/2007  . ALLERGIC RHINITIS 12/16/2007    Otelia Limes, PTA 05/01/2020, 9:55 AM  [place], [name], [address] Phone: [phone]   Fax:  [phone]  Name: Kathy Reese MRN: [id number] Date of Birth: [birth year]  PHYSICAL THERAPY DISCHARGE SUMMARY  Visits from Start of Care: 16 Current functional level related to goals / functional outcomes: As above, pt is doing well and feels she can continue to manage her symptoms at home    Remaining deficits: Tightness in end range of shoulder ROM, generalized weakness    Education / Equipment: Lymphedema management, home exericse program  Plan: Patient agrees to discharge.  Patient goals were met. Patient is being discharged due to being pleased with the current functional level.  ?????  Maudry Diego, PT 05/01/20 2:34 PM

## 2020-05-06 ENCOUNTER — Encounter: Payer: Self-pay | Admitting: *Deleted

## 2020-05-06 NOTE — Progress Notes (Signed)
Patient Care Team: Nolene Ebbs, MD as PCP - General (Internal Medicine) Mauro Kaufmann, RN as Oncology Nurse Navigator Rockwell Germany, RN as Oncology Nurse Navigator Nicholas Lose, MD as Consulting Physician (Hematology and Oncology) Stark Klein, MD as Consulting Physician (General Surgery) Gery Pray, MD as Consulting Physician (Radiation Oncology)  DIAGNOSIS:    ICD-10-CM   1. Malignant neoplasm of upper-inner quadrant of right breast in female, estrogen receptor positive ([place])  C50.211    Z17.0     SUMMARY OF ONCOLOGIC HISTORY: Oncology History  Malignant neoplasm of upper-inner quadrant of right breast in female, estrogen receptor positive ([place])  04/12/2019 Cancer Staging   Staging form: Breast, AJCC 8th Edition - Clinical stage from 04/12/2019: Stage IA (cT1b, cN0, cM0, G3, ER+, PR-, HER2+)    04/19/2019 Initial Diagnosis   Screening mammogram detected right breast asymmetry. Diagnostic mammogram and US showed 2 indeterminate right breast masses, 12m at 1 o'clock, 762mat 12 o'clock, with no axillary adenopathy. 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    06/06/2019 Cancer Staging   Staging form: Breast, AJCC 8th Edition - Pathologic: Stage IA (pT1b, pN0, cM0, G3, ER+, PR-, HER2+)   06/27/2019 - 04/16/2020 Chemotherapy   PACLitaxel (TAXOL) 174 mg in sodium chloride 0.9 % 250 mL chemo infusion (</= 802m2), 80 mg/m2 = 174 mg, Intravenous,  Once, 3 of 3 cycles. Dose modification: 65 mg/m2 (original dose 80 mg/m2, Cycle 3, Reason: Dose not tolerated). Administration: 174 mg (06/27/2019), 174 mg (07/04/2019), 174 mg (07/25/2019), 174 mg (07/11/2019), 174 mg (07/18/2019), 174 mg (08/01/2019), 174 mg (08/08/2019), 174 mg (08/15/2019), 138 mg (08/22/2019)  trastuzumab-anns (KANJINTI) 399 mg in sodium  chloride 0.9 % 250 mL chemo infusion, 4 mg/kg = 399 mg (100 % of original dose 4 mg/kg), Intravenous,  Once, 14 of 17 cycles. Dose modification: 4 mg/kg (original dose 4 mg/kg, Cycle 1, Reason: Other (see comments), Comment: Biosimilar Conversion; [facility] mg/kg (original dose 2 mg/kg, Cycle 3, Reason: Provider Judgment), 6 mg/kg (original dose 2 mg/kg, Cycle 4, Reason: Other (see comments), Comment: switch to maintenance q3 weeks), 600 mg (original dose 2 mg/kg, Cycle 4, Reason: Other (see comments), Comment: maint q3 weeks), 525 mg (original dose 2 mg/kg, Cycle 11, Reason: Other (see comments), Comment: Weight loss). Administration: 399 mg (06/27/2019), 189 mg (07/04/2019), 189 mg (07/11/2019), 189 mg (07/18/2019), 189 mg (07/25/2019), 189 mg (08/01/2019), 189 mg (08/08/2019), 189 mg (08/15/2019), 189 mg (08/22/2019), 600 mg (08/29/2019), 600 mg (09/19/2019), 600 mg (10/10/2019), 600 mg (10/31/2019), 600 mg (11/21/2019), 600 mg (12/12/2019), 600 mg (01/02/2020), 525 mg (01/23/2020), 525 mg (02/13/2020), 600 mg (03/05/2020), 600 mg (03/26/2020), 600 mg (04/16/2020)   09/25/2019 - 10/27/2019 Radiation Therapy   The patient initially received a dose of 40.05 Gy in 15 fractions to the breast using whole-breast tangent fields. This was delivered using a 3-D conformal technique. The pt received a boost delivering an additional 10 Gy in 5 fractions using a electron boost with 10m64mlectrons. The total dose was 50.05 Gy.   11/2019 - 11/2026 Anti-estrogen oral therapy   Anastrozole     CHIEF COMPLIANT: Follow-up onHerceptin maintenance  INTERVAL HISTORY: Kathy Reese 68 y85. with above-mentioned history of right breast cancerwho underwent a Reese,adjuvant chemotherapy,radiation,and is currently on Herceptin maintenance and antiestrogen therapy with  anastrozole. Echo on 03/25/20 showed an ejection fraction of 60-65%.Mammogram on 03/29/20 showed no evidence of malignancy bilaterally. Bone density scan  on 03/29/20 showed normal bone density with a T-score of 0.0. She presents to the clinic todayfortreatment.   ALLERGIES:  has No Known Allergies.  MEDICATIONS:  Current Outpatient Medications  Medication Sig Dispense Refill  . ACCU-CHEK AVIVA PLUS test strip     . Accu-Chek Softclix Lancets lancets     . anastrozole (ARIMIDEX) 1 MG tablet Take 1 tablet (1 mg total) by mouth daily. 90 tablet 3  . aspirin EC 81 MG tablet Take 81 mg by mouth daily.    Marland Kitchen atorvastatin (LIPITOR) 40 MG tablet Take 40 mg by mouth daily at 6 PM.     . Calcium Carb-Cholecalciferol (CALCIUM 600+D3 PO) Take 1 tablet by mouth 2 (two) times a day.    . cholecalciferol (VITAMIN D3) 25 MCG (1000 UT) tablet Take 1,000 Units by mouth daily.    . insulin detemir (LEVEMIR) 100 UNIT/ML injection Inject 12-15 Units into the skin See admin instructions. Inject 15 units subcutaneously in the morning & 12 units subcutaneously at night.    . lidocaine-prilocaine (EMLA) cream Apply to affected area once 30 g 3  . LORazepam (ATIVAN) 0.5 MG tablet TAKE 1 TABLET BY MOUTH AT BEDTIME AS NEEDED FOR SLEEP 30 tablet 0  . losartan-hydrochlorothiazide (HYZAAR) 100-25 MG tablet Take 1 tablet by mouth daily.  4  . metFORMIN (GLUCOPHAGE) 500 MG tablet Take 500 mg by mouth every morning.      No current facility-administered medications for this visit.    PHYSICAL EXAMINATION: ECOG PERFORMANCE STATUS: 1 - Symptomatic but completely ambulatory  Vitals:   05/07/20 1115  BP: (!) 155/42  Pulse: (!) 51  Resp: 18  Temp: 98.7 F (37.1 C)  SpO2: 100%   Filed Weights   05/07/20 1115  Weight: 210 lb 9.6 oz (95.5 kg)    LABORATORY DATA:  I have reviewed the data as listed CMP Latest Ref Rng & Units 04/16/2020 03/26/2020 02/13/2020  Glucose 70 - 99 mg/dL 123(H) 136(H) 110(H)  BUN 8 - 23 mg/dL 26(H) 19 21  Creatinine 0.44 - 1.00 mg/dL 0.97 0.91 0.80  Sodium 135 - 145 mmol/L 140 140 142  Potassium 3.5 - 5.1 mmol/L 4.2 4.2 4.0  Chloride 98 -  111 mmol/L 105 106 106  CO2 22 - 32 mmol/L 27 26 28   Calcium 8.9 - 10.3 mg/dL 9.9 9.2 9.5  Total Protein 6.5 - 8.1 g/dL 7.2 6.8 6.8  Total Bilirubin 0.3 - 1.2 mg/dL 1.0 1.0 1.2  Alkaline Phos 38 - 126 U/L 114 116 103  AST 15 - 41 U/L 19 21 20   ALT 0 - 44 U/L 26 25 25     Lab Results  Component Value Date   WBC 6.7 05/07/2020   HGB 11.5 (L) 05/07/2020   HCT 35.4 (L) 05/07/2020   MCV 90.1 05/07/2020   PLT 195 05/07/2020   NEUTROABS 4.1 05/07/2020    ASSESSMENT & PLAN:  Malignant neoplasm of upper-inner quadrant of right breast in female, estrogen receptor positive ([place]) 04/19/2019:Screening mammogram detected right breast asymmetry. Diagnostic mammogram and US showed 2 indeterminate right breast masses, 68m at 1 o'clock, 785mat 12 o'clock, with no axillary adenopathy. 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 Stage Ia  Treatment plan: 1.Adjuvant chemo with Taxol Herceptinx9 cycles(stopped for neuropathy)06/27/2019-08/22/2019 2. Adjuvant radiation therapy12/22/2020- 10/27/19 3. Adjuvant antiestrogen therapy with anastrozole started 11/21/2019 ------------------------------------------------------------------------------------------------------------------------------------------------------- Current treatment:Herceptin maintenance therapy (to be completed September 2021) with anastrozole 12/21/2019: Echocardiogram EF 60 to 65%  Herceptin toxicities:No further issues with diarrhea  Chemo-induced peripheral neuropathy:grade 1, improving Chemotherapy-induced anemia:  Hemoglobin is 11.5 today  Breast cancer surveillance:  Mammogram  03/29/2020: Benign breast density category B Bone density: 03/29/2020: T score 0: Normal  Echocardiogram 03/25/2020:  EF 60 to 65%   return to clinic every 3 weeks for Herceptin every 6 weeks to follow-up with me. She finishes Herceptin on 06/18/2020. I sent a message to Dr. Barry Dienes to remove the port after that. We briefly discussed the role of neratinib and decided that it was not in her best interest because she is lymph node negative.   No orders of the defined types were placed in this encounter.  The patient has a good understanding of the overall plan. she agrees with it. she will call with any problems that may develop before the next visit here.  Total time spent: 30 mins including face to face time and time spent for planning, charting and coordination of care  Nicholas Lose, MD 05/07/2020  I, Cloyde Reams Dorshimer, am acting as scribe for Dr. Nicholas Lose.  I have reviewed the above documentation for accuracy and completeness, and I agree with the above.

## 2020-05-07 ENCOUNTER — Inpatient Hospital Stay: Payer: Medicare Other

## 2020-05-07 ENCOUNTER — Inpatient Hospital Stay (HOSPITAL_BASED_OUTPATIENT_CLINIC_OR_DEPARTMENT_OTHER): Payer: Medicare Other | Admitting: Hematology and Oncology

## 2020-05-07 ENCOUNTER — Inpatient Hospital Stay: Payer: Medicare Other | Attending: Hematology and Oncology

## 2020-05-07 ENCOUNTER — Other Ambulatory Visit: Payer: Self-pay

## 2020-05-07 DIAGNOSIS — Z170 Estrogen receptor positive status [ER+]: Secondary | ICD-10-CM

## 2020-05-07 DIAGNOSIS — C50211 Malignant neoplasm of upper-inner quadrant of right female breast: Secondary | ICD-10-CM | POA: Insufficient documentation

## 2020-05-07 DIAGNOSIS — Z95828 Presence of other vascular implants and grafts: Secondary | ICD-10-CM

## 2020-05-07 DIAGNOSIS — Z5112 Encounter for antineoplastic immunotherapy: Secondary | ICD-10-CM | POA: Insufficient documentation

## 2020-05-07 LAB — CBC WITH DIFFERENTIAL (CANCER CENTER ONLY)
Abs Immature Granulocytes: 0.01 10*3/uL (ref 0.00–0.07)
Basophils Absolute: 0 10*3/uL (ref 0.0–0.1)
Basophils Relative: 0 %
Eosinophils Absolute: 0.2 10*3/uL (ref 0.0–0.5)
Eosinophils Relative: 4 %
HCT: 35.4 % — ABNORMAL LOW (ref 36.0–46.0)
Hemoglobin: 11.5 g/dL — ABNORMAL LOW (ref 12.0–15.0)
Immature Granulocytes: 0 %
Lymphocytes Relative: 25 %
Lymphs Abs: 1.7 10*3/uL (ref 0.7–4.0)
MCH: 29.3 pg (ref 26.0–34.0)
MCHC: 32.5 g/dL (ref 30.0–36.0)
MCV: 90.1 fL (ref 80.0–100.0)
Monocytes Absolute: 0.7 10*3/uL (ref 0.1–1.0)
Monocytes Relative: 11 %
Neutro Abs: 4.1 10*3/uL (ref 1.7–7.7)
Neutrophils Relative %: 60 %
Platelet Count: 195 10*3/uL (ref 150–400)
RBC: 3.93 MIL/uL (ref 3.87–5.11)
RDW: 13.9 % (ref 11.5–15.5)
WBC Count: 6.7 10*3/uL (ref 4.0–10.5)
nRBC: 0 % (ref 0.0–0.2)

## 2020-05-07 LAB — CMP (CANCER CENTER ONLY)
ALT: 25 U/L (ref 0–44)
AST: 22 U/L (ref 15–41)
Albumin: 3.7 g/dL (ref 3.5–5.0)
Alkaline Phosphatase: 127 U/L — ABNORMAL HIGH (ref 38–126)
Anion gap: 8 (ref 5–15)
BUN: 22 mg/dL (ref 8–23)
CO2: 25 mmol/L (ref 22–32)
Calcium: 9.6 mg/dL (ref 8.9–10.3)
Chloride: 106 mmol/L (ref 98–111)
Creatinine: 0.86 mg/dL (ref 0.44–1.00)
GFR, Est AFR Am: >60 mL/min (ref >60)
GFR, Estimated: >60 mL/min (ref >60)
Glucose, Bld: 155 mg/dL — ABNORMAL HIGH (ref 70–99)
Potassium: 4.3 mmol/L (ref 3.5–5.1)
Sodium: 139 mmol/L (ref 135–145)
Total Bilirubin: 0.7 mg/dL (ref 0.3–1.2)
Total Protein: 7.1 g/dL (ref 6.5–8.1)

## 2020-05-07 MED ORDER — TRASTUZUMAB-ANNS CHEMO 150 MG IV SOLR
600.0000 mg | Freq: Once | INTRAVENOUS | Status: AC
  Administered 2020-05-07: 600 mg via INTRAVENOUS
  Filled 2020-05-07: qty 28.57

## 2020-05-07 MED ORDER — DIPHENHYDRAMINE HCL 25 MG PO CAPS
50.0000 mg | ORAL_CAPSULE | Freq: Once | ORAL | Status: AC
  Administered 2020-05-07: 50 mg via ORAL

## 2020-05-07 MED ORDER — HEPARIN SOD (PORK) LOCK FLUSH 100 UNIT/ML IV SOLN
500.0000 [IU] | Freq: Once | INTRAVENOUS | Status: AC | PRN
  Administered 2020-05-07: 500 [IU]
  Filled 2020-05-07: qty 5

## 2020-05-07 MED ORDER — SODIUM CHLORIDE 0.9% FLUSH
10.0000 mL | Freq: Once | INTRAVENOUS | Status: AC
  Administered 2020-05-07: 10 mL
  Filled 2020-05-07: qty 10

## 2020-05-07 MED ORDER — SODIUM CHLORIDE 0.9% FLUSH
10.0000 mL | INTRAVENOUS | Status: DC | PRN
  Administered 2020-05-07: 10 mL
  Filled 2020-05-07: qty 10

## 2020-05-07 MED ORDER — DIPHENHYDRAMINE HCL 25 MG PO CAPS
ORAL_CAPSULE | ORAL | Status: AC
  Filled 2020-05-07: qty 2

## 2020-05-07 MED ORDER — ACETAMINOPHEN 325 MG PO TABS
ORAL_TABLET | ORAL | Status: AC
  Filled 2020-05-07: qty 2

## 2020-05-07 MED ORDER — SODIUM CHLORIDE 0.9 % IV SOLN
Freq: Once | INTRAVENOUS | Status: AC
  Filled 2020-05-07: qty 250

## 2020-05-07 MED ORDER — ACETAMINOPHEN 325 MG PO TABS
650.0000 mg | ORAL_TABLET | Freq: Once | ORAL | Status: AC
  Administered 2020-05-07: 650 mg via ORAL

## 2020-05-07 NOTE — Assessment & Plan Note (Signed)
04/19/2019:Screening mammogram detected right breast asymmetry. Diagnostic mammogram and US showed 2 indeterminate right breast masses, 48m at 1 o'clock, 773mat 12 o'clock, with no axillary adenopathy. 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 Stage Ia  05/30/2019: [facility], grade 3, 0.6cm, clear margins, 3 axillary lymph nodes negative.HER-2 positive (3+), ER+ (80%), PR -, Ki67 40%. Stage Ia  Treatment plan: 1.Adjuvant chemo with Taxol Herceptinx9 cycles(stopped for neuropathy)06/27/2019-08/22/2019 2. Adjuvant radiation therapy12/22/2020- 10/27/19 3. Adjuvant antiestrogen therapy with anastrozole started 11/21/2019 ------------------------------------------------------------------------------------------------------------------------------------------------------- Current treatment:Herceptin maintenance therapy (to be completed September 2021) with anastrozole 12/21/2019: Echocardiogram EF 60 to 65%  Herceptin toxicities:Herceptin-induced diarrhea: Has not required Imodium.  Chemo-induced peripheral neuropathy:grade 1, improving Chemotherapy-induced anemia: Today we are seeing a slight dip in her hemoglobin to 10.4.  We are watching and observing this.  Breast cancer surveillance:  Mammogram  03/29/2020: Benign breast density category B Bone density: 03/29/2020: T score 0: Normal  Echocardiogram 03/25/2020: EF 60 to 65%   return to clinic every 3 weeks for Herceptin every 6 weeks to follow-up with me.

## 2020-05-08 ENCOUNTER — Telehealth: Payer: Self-pay | Admitting: Hematology and Oncology

## 2020-05-08 NOTE — Telephone Encounter (Signed)
No 8/3 los, no changes made to pt schedule

## 2020-05-09 ENCOUNTER — Other Ambulatory Visit: Payer: Self-pay | Admitting: General Surgery

## 2020-05-14 ENCOUNTER — Encounter (INDEPENDENT_AMBULATORY_CARE_PROVIDER_SITE_OTHER): Payer: Self-pay

## 2020-05-21 ENCOUNTER — Encounter (INDEPENDENT_AMBULATORY_CARE_PROVIDER_SITE_OTHER): Payer: Self-pay

## 2020-05-28 ENCOUNTER — Ambulatory Visit: Payer: Medicare Other

## 2020-05-29 ENCOUNTER — Encounter: Payer: Self-pay | Admitting: *Deleted

## 2020-05-29 ENCOUNTER — Inpatient Hospital Stay: Payer: Medicare Other

## 2020-05-29 ENCOUNTER — Other Ambulatory Visit: Payer: Self-pay

## 2020-05-29 ENCOUNTER — Inpatient Hospital Stay (HOSPITAL_BASED_OUTPATIENT_CLINIC_OR_DEPARTMENT_OTHER): Payer: Medicare Other | Admitting: Adult Health

## 2020-05-29 ENCOUNTER — Encounter: Payer: Self-pay | Admitting: Adult Health

## 2020-05-29 VITALS — BP 174/62

## 2020-05-29 VITALS — BP 185/69 | HR 51 | Temp 97.8°F | Resp 18 | Ht 63.5 in | Wt 213.0 lb

## 2020-05-29 DIAGNOSIS — C50211 Malignant neoplasm of upper-inner quadrant of right female breast: Secondary | ICD-10-CM

## 2020-05-29 DIAGNOSIS — Z170 Estrogen receptor positive status [ER+]: Secondary | ICD-10-CM

## 2020-05-29 DIAGNOSIS — Z5112 Encounter for antineoplastic immunotherapy: Secondary | ICD-10-CM | POA: Diagnosis not present

## 2020-05-29 MED ORDER — DIPHENHYDRAMINE HCL 25 MG PO CAPS
ORAL_CAPSULE | ORAL | Status: AC
  Filled 2020-05-29: qty 2

## 2020-05-29 MED ORDER — ACETAMINOPHEN 325 MG PO TABS
ORAL_TABLET | ORAL | Status: AC
  Filled 2020-05-29: qty 2

## 2020-05-29 MED ORDER — TRASTUZUMAB-ANNS CHEMO 150 MG IV SOLR
600.0000 mg | Freq: Once | INTRAVENOUS | Status: AC
  Administered 2020-05-29: 600 mg via INTRAVENOUS
  Filled 2020-05-29: qty 28.57

## 2020-05-29 MED ORDER — ACETAMINOPHEN 325 MG PO TABS
650.0000 mg | ORAL_TABLET | Freq: Once | ORAL | Status: AC
  Administered 2020-05-29: 650 mg via ORAL

## 2020-05-29 MED ORDER — SODIUM CHLORIDE 0.9% FLUSH
10.0000 mL | INTRAVENOUS | Status: DC | PRN
  Filled 2020-05-29: qty 10

## 2020-05-29 MED ORDER — SODIUM CHLORIDE 0.9 % IV SOLN
Freq: Once | INTRAVENOUS | Status: AC
  Filled 2020-05-29: qty 250

## 2020-05-29 MED ORDER — HEPARIN SOD (PORK) LOCK FLUSH 100 UNIT/ML IV SOLN
500.0000 [IU] | Freq: Once | INTRAVENOUS | Status: DC | PRN
  Filled 2020-05-29: qty 5

## 2020-05-29 MED ORDER — DIPHENHYDRAMINE HCL 25 MG PO CAPS
50.0000 mg | ORAL_CAPSULE | Freq: Once | ORAL | Status: AC
  Administered 2020-05-29: 50 mg via ORAL

## 2020-05-29 NOTE — Patient Instructions (Signed)
[facility] Discharge Instructions for Patients Receiving Chemotherapy  Today you received the following chemotherapy agents: trastuzumab-anns  To help prevent nausea and vomiting after your treatment, we encourage you to take your nausea medication as directed.   If you develop nausea and vomiting that is not controlled by your nausea medication, call the clinic.   BELOW ARE SYMPTOMS THAT SHOULD BE REPORTED IMMEDIATELY:  *FEVER GREATER THAN 100.5 F  *CHILLS WITH OR WITHOUT FEVER  NAUSEA AND VOMITING THAT IS NOT CONTROLLED WITH YOUR NAUSEA MEDICATION  *UNUSUAL SHORTNESS OF BREATH  *UNUSUAL BRUISING OR BLEEDING  TENDERNESS IN MOUTH AND THROAT WITH OR WITHOUT PRESENCE OF ULCERS  *URINARY PROBLEMS  *BOWEL PROBLEMS  UNUSUAL RASH Items with * indicate a potential emergency and should be followed up as soon as possible.  Feel free to call the clinic should you have any questions or concerns. The clinic phone number is [phone].  Please show the CHEMO ALERT CARD at check-in to the Emergency Department and triage nurse.   

## 2020-05-29 NOTE — Progress Notes (Signed)
SURVIVORSHIP VIRTUAL VISIT:   BRIEF ONCOLOGIC HISTORY:  Oncology History  Malignant neoplasm of upper-inner quadrant of right breast in female, estrogen receptor positive ([place])  04/12/2019 Cancer Staging   Staging form: Breast, AJCC 8th Edition - Clinical stage from 04/12/2019: Stage IA (cT1b, cN0, cM0, G3, ER+, PR-, HER2+)    04/19/2019 Initial Diagnosis   Screening mammogram detected right breast asymmetry. Diagnostic mammogram and US showed 2 indeterminate right breast masses, 43m at 1 o'clock, 763mat 12 o'clock, with no axillary adenopathy. 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    05/30/2019 Surgery   Right lumpectomy (BBarry Dienes([phone] IDC with DCIS, grade 3, 0.6cm, clear margins, 3 axillary lymph nodes negative.    06/06/2019 Cancer Staging   Staging form: Breast, AJCC 8th Edition - Pathologic: Stage IA (pT1b, pN0, cM0, G3, ER+, PR-, HER2+)   06/27/2019 - 04/16/2020 Chemotherapy   PACLitaxel (TAXOL) 174 mg in sodium chloride 0.9 % 250 mL chemo infusion (</= 8046m2), 80 mg/m2 = 174 mg, Intravenous,  Once, 3 of 3 cycles. Dose modification: 65 mg/m2 (original dose 80 mg/m2, Cycle 3, Reason: Dose not tolerated). Administration: 174 mg (06/27/2019), 174 mg (07/04/2019), 174 mg (07/25/2019), 174 mg (07/11/2019), 174 mg (07/18/2019), 174 mg (08/01/2019), 174 mg (08/08/2019), 174 mg (08/15/2019), 138 mg (08/22/2019)  trastuzumab-anns (KANJINTI) 399 mg in sodium chloride 0.9 % 250 mL chemo infusion, 4 mg/kg = 399 mg (100 % of original dose 4 mg/kg), Intravenous,  Once, 14 of 17 cycles. Dose modification: 4 mg/kg (original dose 4 mg/kg, Cycle 1, Reason: Other (see comments), Comment: Biosimilar Conversion; [facility] mg/kg (original dose 2 mg/kg, Cycle 3, Reason: Provider Judgment), 6 mg/kg (original dose 2 mg/kg, Cycle 4, Reason: Other (see comments), Comment: switch to maintenance q3 weeks), 600 mg (original dose 2 mg/kg, Cycle 4, Reason: Other (see  comments), Comment: maint q3 weeks), 525 mg (original dose 2 mg/kg, Cycle 11, Reason: Other (see comments), Comment: Weight loss). Administration: 399 mg (06/27/2019), 189 mg (07/04/2019), 189 mg (07/11/2019), 189 mg (07/18/2019), 189 mg (07/25/2019), 189 mg (08/01/2019), 189 mg (08/08/2019), 189 mg (08/15/2019), 189 mg (08/22/2019), 600 mg (08/29/2019), 600 mg (09/19/2019), 600 mg (10/10/2019), 600 mg (10/31/2019), 600 mg (11/21/2019), 600 mg (12/12/2019), 600 mg (01/02/2020), 525 mg (01/23/2020), 525 mg (02/13/2020), 600 mg (03/05/2020), 600 mg (03/26/2020), 600 mg (04/16/2020)   09/25/2019 - 10/27/2019 Radiation Therapy   The patient initially received a dose of 40.05 Gy in 15 fractions to the breast using whole-breast tangent fields. This was delivered using a 3-D conformal technique. The pt received a boost delivering an additional 10 Gy in 5 fractions using a electron boost with [id number]. The total dose was 50.05 Gy.   11/2019 - 11/2026 Anti-estrogen oral therapy   Anastrozole     INTERVAL HISTORY:  [name] her survivorship care plan detailing her treatment course for breast cancer, as well as monitoring long-term side effects of that treatment, education regarding health maintenance, screening, and overall wellness and health promotion.     Overall, Kathy Reese feeling quite well.  She is taking Anastrozole daily and is tolerating it well with no issues.  She completed survivorship survey and notes no concerns.  REVIEW OF SYSTEMS:  Review of Systems - Oncology Breast: Denies any new nodularity, masses, tenderness, nipple changes, or nipple discharge.      ONCOLOGY TREATMENT TEAM:  1. Surgeon:  [facility] 2. Medical Oncologist: Dr.  Gudena  3. Radiation Oncologist: Dr. Sondra Come    PAST MEDICAL/SURGICAL HISTORY:  Past Medical History:  Diagnosis Date  . Allergic rhinitis, cause unspecified   . Arthritis   . Depression   . Diverticulosis   .  History of Bell's palsy   . History of diabetes mellitus   . Hypersomnia   . OSA on CPAP    not using at this time 06/15/19  . Other and unspecified hyperlipidemia   . Personal history of chemotherapy   . Personal history of malignant neoplasm of ovary   . Personal history of radiation therapy   . Type II or unspecified type diabetes mellitus without mention of complication, not stated as uncontrolled   . Unspecified essential hypertension   . Vertigo    Past Surgical History:  Procedure Laterality Date  . ABDOMINAL ADHESION SURGERY  12/96   exc peritoneal cyst  . BREAST BIOPSY Left 2000   stereotactic, negative  . BREAST BIOPSY Right 05/08/2019  . BREAST LUMPECTOMY Right 05/30/2019  . BREAST LUMPECTOMY WITH RADIOACTIVE SEED AND SENTINEL LYMPH NODE BIOPSY Right 05/30/2019   Procedure: RIGHT BREAST LUMPECTOMY WITH BRACKETED RADIOACTIVE SEEDS  AND SENTINEL LYMPH NODE BIOPSY;  Surgeon: Stark Klein, MD;  Location: [place];  Service: General;  Laterality: Right;  . BREAST REDUCTION SURGERY Bilateral 10/03  . EYE SURGERY Left    cataracts  . LAPAROSCOPIC SALPINGO OOPHERECTOMY Right 12/96  . 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 LAPAROSCOPY ABDOMEN DIAGNOSTIC  2/08   w/LOA, LSO  . PORTACATH PLACEMENT Left 06/16/2019   Procedure: INSERTION PORT-A-CATH;  Surgeon: Stark Klein, MD;  Location: [place];  Service: General;  Laterality: Left;  . REDUCTION MAMMAPLASTY Bilateral 2004  . TONSILLECTOMY    . TOTAL ABDOMINAL HYSTERECTOMY W/ BILATERAL SALPINGOOPHORECTOMY  5/94   secondary to uterine fibroids  . TOTAL KNEE ARTHROPLASTY Right 05/12/2013   Procedure: TOTAL KNEE ARTHROPLASTY;  Surgeon: Alta Corning, MD;  Location: [place];  Service: Orthopedics;  Laterality: Right;  . TRIGGER FINGER RELEASE  07/06/2012   Procedure: RELEASE TRIGGER FINGER/A-1 PULLEY;  Surgeon: Alta Corning, MD;  Location: [place];  Service: Orthopedics;   Laterality: Left;  left ring finger  . TUBAL LIGATION       ALLERGIES:  No Known Allergies   CURRENT MEDICATIONS:  Outpatient Encounter Medications as of 05/29/2020  Medication Sig Note  . ACCU-CHEK AVIVA PLUS test strip    . Accu-Chek Softclix Lancets lancets    . anastrozole (ARIMIDEX) 1 MG tablet Take 1 tablet (1 mg total) by mouth daily.   Marland Kitchen aspirin EC 81 MG tablet Take 81 mg by mouth daily.   Marland Kitchen atorvastatin (LIPITOR) 40 MG tablet Take 40 mg by mouth daily at 6 PM.    . Calcium Carb-Cholecalciferol (CALCIUM 600+D3 PO) Take 1 tablet by mouth 2 (two) times a day.   . cholecalciferol (VITAMIN D3) 25 MCG (1000 UT) tablet Take 1,000 Units by mouth daily.   . insulin detemir (LEVEMIR) 100 UNIT/ML injection Inject 12-15 Units into the skin See admin instructions. Inject 15 units subcutaneously in the morning & 12 units subcutaneously at night.   . lidocaine-prilocaine (EMLA) cream Apply to affected area once 06/14/2019: Patient to begin after beginning chemotherapy   . LORazepam (ATIVAN) 0.5 MG tablet TAKE 1 TABLET BY MOUTH AT BEDTIME AS NEEDED FOR SLEEP   . losartan-hydrochlorothiazide (HYZAAR) 100-25 MG tablet Take 1 tablet by  mouth daily.   . metFORMIN (GLUCOPHAGE) 500 MG tablet Take 500 mg by mouth every morning.     No facility-administered encounter medications on file as of 05/29/2020.     ONCOLOGIC FAMILY HISTORY:  Family History  Problem Relation Age of Onset  . Lung cancer Mother   . Colon cancer Father   . Stroke Brother   . Stroke Sister   . Pulmonary Hypertension Sister      GENETIC COUNSELING/TESTING: Not at this time  SOCIAL HISTORY:  Social History   Socioeconomic History  . Marital status: Married    Spouse name: Not on file  . Number of children: 3  . Years of education: Not on file  . Highest education level: Not on file  Occupational History  . Occupation: Fish farm manager REP    Employer: [employer]  Tobacco Use  . Smoking status: Never Smoker   . Smokeless tobacco: Never Used  Vaping Use  . Vaping Use: Never used  Substance and Sexual Activity  . Alcohol use: Yes    Alcohol/week: 1.0 standard drink    Types: 1 Glasses of wine per week    Comment: occasionally  . Drug use: No  . Sexual activity: Not Currently    Partners: Male    Birth control/protection: Abstinence  Other Topics Concern  . Not on file  Social History Narrative  . Not on file   Social Determinants of Health   Financial Resource Strain:   . Difficulty of Paying Living Expenses: Not on file  Food Insecurity:   . Worried About Charity fundraiser in the Last Year: Not on file  . Ran Out of Food in the Last Year: Not on file  Transportation Needs:   . Lack of Transportation (Medical): Not on file  . Lack of Transportation (Non-Medical): Not on file  Physical Activity:   . Days of Exercise per Week: Not on file  . Minutes of Exercise per Session: Not on file  Stress:   . Feeling of Stress : Not on file  Social Connections:   . Frequency of Communication with Friends and Family: Not on file  . Frequency of Social Gatherings with Friends and Family: Not on file  . Attends Religious Services: Not on file  . Active Member of Clubs or Organizations: Not on file  . Attends Archivist Meetings: Not on file  . Marital Status: Not on file  Intimate Partner Violence:   . Fear of Current or Ex-Partner: Not on file  . Emotionally Abused: Not on file  . Physically Abused: Not on file  . Sexually Abused: Not on file     OBSERVATIONS/OBJECTIVE:  BP (!) 185/69 (BP Location: Left Wrist, Patient Position: Sitting)   Pulse (!) 51   Temp 97.8 F (36.6 C) (Tympanic)   Resp 18   Ht 5' 3.5" (1.613 m)   Wt 213 lb (96.6 kg)   LMP 02/02/1993 (Exact Date)   SpO2 100%   BMI 37.14 kg/m  GENERAL: Patient is a well appearing female in no acute distress HEENT:  Sclerae anicteric. Mask in place. Neck is supple.  NODES:  No cervical, supraclavicular, or  axillary lymphadenopathy palpated.  BREAST EXAM:  Right breast s/p reduction/lumpectomy and radiation, no sign of local recurrence, left breast benign. LUNGS:  Clear to auscultation bilaterally.  No wheezes or rhonchi. HEART:  Regular rate and rhythm. No murmur appreciated. ABDOMEN:  Soft, nontender.  Positive, normoactive bowel sounds. No organomegaly palpated.  MSK:  No focal spinal tenderness to palpation. Full range of motion bilaterally in the upper extremities. EXTREMITIES:  No peripheral edema.   SKIN:  Clear with no obvious rashes or skin changes. No nail dyscrasia. NEURO:  Nonfocal. Well oriented.  Appropriate affect.    LABORATORY DATA:  None for this visit.  DIAGNOSTIC IMAGING:  None for this visit.      ASSESSMENT AND PLAN:  Kathy Reese is a pleasant 69 y.o. female with Stage IA right breast invasive ductal carcinoma, ER+/PR-/HER2+, diagnosed in 04/2019, treated with lumpectomy, adjuvant chemotherapy, maintenance Herceptin adjuvant radiation therapy, and anti-estrogen therapy with Anastrozole beginning in 11/2019.  She presents to the Survivorship Clinic for our initial meeting and routine follow-up post-completion of treatment for breast cancer.    1. Stage IA right/left breast cancer:  Kathy Reese is continuing to recover from definitive treatment for breast cancer. She will follow-up with her medical oncologist, Dr. Lindi Adie in 6 months with history and physical exam per surveillance protocol.  She will continue her anti-estrogen therapy with Anastrozole. Thus far, she is tolerating the Anastrozole well, with minimal side effects. She was instructed to make Dr. Lindi Adie or myself aware if she begins to experience any worsening side effects of the medication and I could see her back in clinic to help manage those side effects, as needed. Her mammogram is due annually in June; orders placed today.  She would like to return to see me in one years time, which I am to accommodate. Today, a  comprehensive survivorship care plan and treatment summary was reviewed with the patient today detailing her breast cancer diagnosis, treatment course, potential late/long-term effects of treatment, appropriate follow-up care with recommendations for the future, and patient education resources.  A copy of this summary, along with a letter will be sent to the patient's primary care provider via mail/fax/In Basket message after today's visit.    2. Bone health:  Given Kathy Reese's age/history of breast cancer and her current treatment regimen including anti-estrogen therapy with Anastrozole, she is at risk for bone demineralization.  Her last DEXA scan was 03/29/2020, was normal. She will be due for repeat in 2 years.  In the meantime, she was encouraged to increase her consumption of foods rich in calcium, as well as increase her weight-bearing activities.  She was given education on specific activities to promote bone health.  3. Cancer screening:  Due to Kathy Reese's history and her age, she should receive screening for skin cancers, colon cancer, and gynecologic cancers.  The information and recommendations are listed on the patient's comprehensive care plan/treatment summary and were reviewed in detail with the patient.    4. Health maintenance and wellness promotion: Kathy Reese was encouraged to consume 5-7 servings of fruits and vegetables per day. We reviewed the "Nutrition Rainbow" handout, as well as the handout "Take Control of Your Health and Reduce Your Cancer Risk" from the [place].  She was also encouraged to engage in moderate to vigorous exercise for 30 minutes per day most days of the week. We discussed the [employer] fitness program, which is designed for cancer survivors to help them become more physically fit after cancer treatments.  She was instructed to limit her alcohol consumption and continue to abstain from tobacco use.     5. Support services/counseling: It is not  uncommon for this period of the patient's cancer care trajectory to be one of many emotions and stressors.  We discussed how this  can be increasingly difficult during the times of quarantine and social distancing due to the COVID-19 pandemic.   She was given information regarding our available services and encouraged to contact me with any questions or for help enrolling in any of our support group/programs.    Follow up instructions:    -Return to cancer center 9/14 for final herceptin and in 6 months for f/u with Dr. Lindi Adie  -Follow-up with Mendel Ryder in survivorship in 1 year -Mammogram due in 03/2021 -Follow up with Dr. Barry Dienes for port removal   The patient was provided an opportunity to ask questions and all were answered. The patient agreed with the plan and demonstrated an understanding of the instructions.   Total encounter time: 30 minutes*  Wilber Bihari, NP 05/29/20 11:20 AM Medical Oncology and Hematology [facility] [address] [place] [address] Tel. [phone]    Fax. [phone]  *Total Encounter Time as defined by the Centers for Medicare and Medicaid Services includes, in addition to the face-to-face time of a patient visit (documented in the note above) non-face-to-face time: obtaining and reviewing outside history, ordering and reviewing medications, tests or procedures, care coordination (communications with other health care professionals or caregivers) and documentation in the medical record.

## 2020-05-30 ENCOUNTER — Telehealth: Payer: Self-pay | Admitting: Hematology and Oncology

## 2020-05-30 NOTE — Telephone Encounter (Signed)
Scheduled follow up per 8/26 staff msg. Pt confirmed new arrival time.

## 2020-06-03 ENCOUNTER — Ambulatory Visit
Admission: RE | Admit: 2020-06-03 | Discharge: 2020-06-03 | Disposition: A | Discharge status: Home / Self Care | Payer: Medicare Other | Source: Ambulatory Visit | Attending: Radiation Oncology | Admitting: Radiation Oncology

## 2020-06-03 ENCOUNTER — Other Ambulatory Visit: Payer: Self-pay

## 2020-06-03 ENCOUNTER — Encounter: Payer: Self-pay | Admitting: Radiation Oncology

## 2020-06-03 DIAGNOSIS — Z7982 Long term (current) use of aspirin: Secondary | ICD-10-CM | POA: Diagnosis not present

## 2020-06-03 DIAGNOSIS — C50211 Malignant neoplasm of upper-inner quadrant of right female breast: Secondary | ICD-10-CM | POA: Insufficient documentation

## 2020-06-03 DIAGNOSIS — Z170 Estrogen receptor positive status [ER+]: Secondary | ICD-10-CM | POA: Insufficient documentation

## 2020-06-03 DIAGNOSIS — Z923 Personal history of irradiation: Secondary | ICD-10-CM | POA: Insufficient documentation

## 2020-06-03 DIAGNOSIS — Z79899 Other long term (current) drug therapy: Secondary | ICD-10-CM | POA: Insufficient documentation

## 2020-06-03 NOTE — Progress Notes (Signed)
Patient here for a f/u visit with Dr. Sondra Come. Patient denies pain or problems with ROM on her right side. She does still feel some scar tissue in her right breast.  BP (!) 165/67 (BP Location: Left Arm, Patient Position: Sitting, Cuff Size: Large)   Pulse (!) 53   Temp (!) 97.4 F (36.3 C)   Resp 20   Ht 5' 3.5" (1.613 m)   Wt 209 lb 6.4 oz (95 kg)   LMP 02/02/1993 (Exact Date)   SpO2 100%   BMI 36.51 kg/m   Wt Readings from Last 3 Encounters:  06/03/20 209 lb 6.4 oz (95 kg)  05/29/20 213 lb (96.6 kg)  05/07/20 210 lb 9.6 oz (95.5 kg)

## 2020-06-03 NOTE — Progress Notes (Signed)
Radiation Oncology         [phone] ________________________________  Name: Kathy Reese MRN: [id number]  Date: 06/03/2020  DOB: 12-27-1950  Follow-Up Visit Note  CC: Kathy Ebbs, MD  Kathy Ebbs, MD    ICD-10-CM   1. Malignant neoplasm of upper-inner quadrant of right breast in female, estrogen receptor positive ([place])  C50.211    Z17.0     Diagnosis:  StageIA, (pT1b,pN0, cM0) RightBreast UIQ,Invasive DuctalCarcinomawith DCIS, ER+/ PR-/ Her2+, Grade3  Interval Since Last Radiation: Seven months, one week, and one day.  09/25/2019 through 10/27/2019 Site Technique Total Dose (Gy) Dose per Fx (Gy) Completed Fx Beam Energies  Breast, Right: Breast_Rt 3D 40.05/40.05 2.67 15/15 6X, 10X, 15X  Breast, Right: Breast_Rt_Bst specialPort 10/10 2 5/5 15E    Narrative:  The patient returns today for routine follow-up. Since her last visit, she underwent an echocardiogram on 03/25/2020 that showed an EF of 60-65%.  Bilateral diagnostic mammogram on 03/29/2020, which did now show any mammographic evidence of malignancy.  She was last seen by Dr. Lindi Adie on 05/07/2020. She continues on Herceptin maintenance but has experienced some diarrhea that has not required Imodium, improving grade 1 peripheral neuropathy, and anemia that is being monitored.   On review of systems, she reports feeling some scar tissue in her right breast. She denies breast pain and difficulty with range of motion of right upper extremity.  She did complete a course of physical therapy  ALLERGIES:  has No Known Allergies.  Meds: Current Outpatient Medications  Medication Sig Dispense Refill  . ACCU-CHEK AVIVA PLUS test strip     . Accu-Chek Softclix Lancets lancets     . anastrozole (ARIMIDEX) 1 MG tablet Take 1 tablet (1 mg total) by mouth daily. 90 tablet 3  . aspirin EC 81 MG tablet Take 81 mg by mouth daily.    Marland Kitchen atorvastatin (LIPITOR) 40 MG tablet Take 40 mg by mouth daily at 6 PM.     .  Calcium Carb-Cholecalciferol (CALCIUM 600+D3 PO) Take 1 tablet by mouth 2 (two) times a day.    . cholecalciferol (VITAMIN D3) 25 MCG (1000 UT) tablet Take 1,000 Units by mouth daily.    . insulin detemir (LEVEMIR) 100 UNIT/ML injection Inject 12-15 Units into the skin See admin instructions. Inject 15 units subcutaneously in the morning & 12 units subcutaneously at night.    . lidocaine-prilocaine (EMLA) cream Apply to affected area once 30 g 3  . LORazepam (ATIVAN) 0.5 MG tablet TAKE 1 TABLET BY MOUTH AT BEDTIME AS NEEDED FOR SLEEP 30 tablet 0  . losartan-hydrochlorothiazide (HYZAAR) 100-25 MG tablet Take 1 tablet by mouth daily.  4  . metFORMIN (GLUCOPHAGE) 500 MG tablet Take 500 mg by mouth every morning.      No current facility-administered medications for this encounter.    Physical Findings: The patient is in no acute distress. Patient is alert and oriented.  height is 5' 3.5" (1.613 m) and weight is 209 lb 6.4 oz (95 kg). Her temperature is 97.4 F (36.3 C) (abnormal). Her blood pressure is 165/67 (abnormal) and her pulse is 53 (abnormal). Her respiration is 20 and oxygen saturation is 100%.    Lungs are clear to auscultation bilaterally. Heart has regular rate and rhythm. No palpable cervical, supraclavicular, or axillary adenopathy. Abdomen soft, non-tender, normal bowel sounds. Left Breast: no palpable mass, nipple discharge or bleeding. Right Breast: Mild hyperpigmentation changes noted primarily in the upper inner aspect of the breast.  No  nipple discharge or bleeding.  No dominant mass appreciated in the breast.  Mild induration in the upper inner aspect consistent with mild scar tissue  Lab Findings: Lab Results  Component Value Date   WBC 6.7 05/07/2020   HGB 11.5 (L) 05/07/2020   HCT 35.4 (L) 05/07/2020   MCV 90.1 05/07/2020   PLT 195 05/07/2020    Radiographic Findings: No results found.  Impression: StageIA, (pT1b,pN0, cM0) RightBreast UIQ,Invasive  DuctalCarcinomawith DCIS, ER+/ PR-/ Her2+, Grade3  No evidence of recurrence on clinical exam today.  Recent mammograms in June also showed no evidence of recurrence  Plan: The patient will follow-up with Dr. Lindi Adie in five months.  As needed follow-up in radiation oncology in light of her close follow-up with medical oncology  Total time spent in this encounter was 15 minutes which included reviewing the patient's most recent follow-ups, mammogram, echocardiogram, physical examination, and documentation. ____________________________________   Blair Promise, PhD, MD  This document serves as a record of services personally performed by Gery Pray, MD. It was created on his behalf by Clerance Lav, a trained medical scribe. The creation of this record is based on the scribe's personal observations and the provider's statements to them. This document has been checked and approved by the attending provider.

## 2020-06-17 NOTE — Progress Notes (Signed)
Patient Care Team: Nolene Ebbs, MD as PCP - General (Internal Medicine) Nicholas Lose, MD as Consulting Physician (Hematology and Oncology) Stark Klein, MD as Consulting Physician (General Surgery) Gery Pray, MD as Consulting Physician (Radiation Oncology)  DIAGNOSIS:    ICD-10-CM   1. Malignant neoplasm of upper-inner quadrant of right breast in female, estrogen receptor positive ([place])  C50.211    Z17.0     SUMMARY OF ONCOLOGIC HISTORY: Oncology History  Malignant neoplasm of upper-inner quadrant of right breast in female, estrogen receptor positive ([place])  04/12/2019 Cancer Staging   Staging form: Breast, AJCC 8th Edition - Clinical stage from 04/12/2019: Stage IA (cT1b, cN0, cM0, G3, ER+, PR-, HER2+)    04/19/2019 Initial Diagnosis   Screening mammogram detected right breast asymmetry. Diagnostic mammogram and US showed 2 indeterminate right breast masses, 43m at 1 o'clock, 776mat 12 o'clock, with no axillary adenopathy. 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    05/30/2019 Surgery   Right lumpectomy (BBarry Dienes([phone] IDC with DCIS, grade 3, 0.6cm, clear margins, 3 axillary lymph nodes negative.    06/06/2019 Cancer Staging   Staging form: Breast, AJCC 8th Edition - Pathologic: Stage IA (pT1b, pN0, cM0, G3, ER+, PR-, HER2+)   06/27/2019 - 04/16/2020 Chemotherapy   PACLitaxel (TAXOL) 174 mg in sodium chloride 0.9 % 250 mL chemo infusion (</= 8024m2), 80 mg/m2 = 174 mg, Intravenous,  Once, 3 of 3 cycles. Dose modification: 65 mg/m2 (original dose 80 mg/m2, Cycle 3, Reason: Dose not tolerated). Administration: 174 mg (06/27/2019), 174 mg (07/04/2019), 174 mg (07/25/2019), 174 mg (07/11/2019), 174 mg (07/18/2019), 174 mg (08/01/2019), 174 mg (08/08/2019), 174 mg (08/15/2019), 138 mg (08/22/2019)  trastuzumab-anns (KANJINTI) 399 mg in sodium chloride 0.9 % 250 mL chemo infusion, 4 mg/kg = 399 mg (100 % of original dose 4 mg/kg), Intravenous,   Once, 14 of 17 cycles. Dose modification: 4 mg/kg (original dose 4 mg/kg, Cycle 1, Reason: Other (see comments), Comment: Biosimilar Conversion; [facility] mg/[facility] (original dose 2 mg/kg, Cycle 3, Reason: Provider Judgment), 6 mg/kg (original dose 2 mg/kg, Cycle 4, Reason: Other (see comments), Comment: switch to maintenance q3 weeks), 600 mg (original dose 2 mg/kg, Cycle 4, Reason: Other (see comments), Comment: maint q3 weeks), 525 mg (original dose 2 mg/kg, Cycle 11, Reason: Other (see comments), Comment: Weight loss). Administration: 399 mg (06/27/2019), 189 mg (07/04/2019), 189 mg (07/11/2019), 189 mg (07/18/2019), 189 mg (07/25/2019), 189 mg (08/01/2019), 189 mg (08/08/2019), 189 mg (08/15/2019), 189 mg (08/22/2019), 600 mg (08/29/2019), 600 mg (09/19/2019), 600 mg (10/10/2019), 600 mg (10/31/2019), 600 mg (11/21/2019), 600 mg (12/12/2019), 600 mg (01/02/2020), 525 mg (01/23/2020), 525 mg (02/13/2020), 600 mg (03/05/2020), 600 mg (03/26/2020), 600 mg (04/16/2020)   09/25/2019 - 10/27/2019 Radiation Therapy   The patient initially received a dose of 40.05 Gy in 15 fractions to the breast using whole-breast tangent fields. This was delivered using a 3-D conformal technique. The pt received a boost delivering an additional 10 Gy in 5 fractions using a electron boost with 5m61mlectrons. The total dose was 50.05 Gy.   11/2019 - 11/2026 Anti-estrogen oral therapy   Anastrozole     CHIEF COMPLIANT: Follow-up onHerceptin maintenance  INTERVAL HISTORY: Kathy Reese 68 y7. with above-mentioned history of right breast cancerwho underwent a lumpectomy,adjuvant chemotherapy,radiation,and is currently on Herceptin maintenanceand antiestrogen therapy with anastrozole. She presents to the clinic todayfortreatment.  She has tolerated Herceptin extremely well without any problems  or concerns.  She continues to have mild numbness of the tips of the fingers but no neuropathy pain or discomfort.   ALLERGIES:   has No Known Allergies.  MEDICATIONS:  Current Outpatient Medications  Medication Sig Dispense Refill  . ACCU-CHEK AVIVA PLUS test strip     . Accu-Chek Softclix Lancets lancets     . anastrozole (ARIMIDEX) 1 MG tablet Take 1 tablet (1 mg total) by mouth daily. 90 tablet 3  . aspirin EC 81 MG tablet Take 81 mg by mouth daily.    Marland Kitchen atorvastatin (LIPITOR) 40 MG tablet Take 40 mg by mouth daily at 6 PM.     . Calcium Carb-Cholecalciferol (CALCIUM 600+D3 PO) Take 1 tablet by mouth 2 (two) times a day.    . cholecalciferol (VITAMIN D3) 25 MCG (1000 UT) tablet Take 1,000 Units by mouth daily.    . insulin detemir (LEVEMIR) 100 UNIT/ML injection Inject 12-15 Units into the skin See admin instructions. Inject 15 units subcutaneously in the morning & 12 units subcutaneously at night.    . lidocaine-prilocaine (EMLA) cream Apply to affected area once 30 g 3  . LORazepam (ATIVAN) 0.5 MG tablet TAKE 1 TABLET BY MOUTH AT BEDTIME AS NEEDED FOR SLEEP 30 tablet 0  . losartan-hydrochlorothiazide (HYZAAR) 100-25 MG tablet Take 1 tablet by mouth daily.  4  . metFORMIN (GLUCOPHAGE) 500 MG tablet Take 500 mg by mouth every morning.      No current facility-administered medications for this visit.    PHYSICAL EXAMINATION: ECOG PERFORMANCE STATUS: 1 - Symptomatic but completely ambulatory  Vitals:   06/18/20 0856  BP: (!) 175/60  Pulse: (!) 53  Resp: 17  Temp: (!) 97.1 F (36.2 C)  SpO2: 99%   Filed Weights   06/18/20 0856  Weight: 213 lb (96.6 kg)    LABORATORY DATA:  I have reviewed the data as listed CMP Latest Ref Rng & Units 05/07/2020 04/16/2020 03/26/2020  Glucose 70 - 99 mg/dL 155(H) 123(H) 136(H)  BUN 8 - 23 mg/dL 22 26(H) 19  Creatinine 0.44 - 1.00 mg/dL 0.86 0.97 0.91  Sodium 135 - 145 mmol/L 139 140 140  Potassium 3.5 - 5.1 mmol/L 4.3 4.2 4.2  Chloride 98 - 111 mmol/L 106 105 106  CO2 22 - 32 mmol/L 25 27 26   Calcium 8.9 - 10.3 mg/dL 9.6 9.9 9.2  Total Protein 6.5 - 8.1 g/dL 7.1 7.2  6.8  Total Bilirubin 0.3 - 1.2 mg/dL 0.7 1.0 1.0  Alkaline Phos 38 - 126 U/L 127(H) 114 116  AST 15 - 41 U/L 22 19 21   ALT 0 - 44 U/L 25 26 25     Lab Results  Component Value Date   WBC 6.7 05/07/2020   HGB 11.5 (L) 05/07/2020   HCT 35.4 (L) 05/07/2020   MCV 90.1 05/07/2020   PLT 195 05/07/2020   NEUTROABS 4.1 05/07/2020    ASSESSMENT & PLAN:  Malignant neoplasm of upper-inner quadrant of right breast in female, estrogen receptor positive ([place]) 04/19/2019:Screening mammogram detected right breast asymmetry. Diagnostic mammogram and US showed 2 indeterminate right breast masses, 63m at 1 o'clock, 746mat 12 o'clock, with no axillary adenopathy. 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 Stage Ia  05/30/2019: [facility], grade 3, 0.6cm, clear margins, 3 axillary lymph nodes negative.HER-2 positive (3+), ER+ (80%), PR -, Ki67 40%. Stage Ia  Treatment plan: 1.Adjuvant chemo with Taxol Herceptinx9 cycles(stopped for  neuropathy)06/27/2019-08/22/2019 2. Adjuvant radiation therapy12/22/2020- 10/27/19 3. Adjuvant antiestrogen therapywith anastrozolestarted2/16/2021 ------------------------------------------------------------------------------------------------------------------------------------------------------- Current treatment:Herceptin maintenance therapy (to be completed September 2021) with anastrozole 12/21/2019: Echocardiogram EF 60 to 65%  Herceptintoxicities:No further issues with diarrhea  Chemo-induced peripheral neuropathy:grade 1, improving Chemotherapy-induced anemia: Hemoglobin is 11.5 previously  Breast cancer surveillance:  Mammogram  03/29/2020: Benign breast density category B Bone density: 03/29/2020: T score 0: Normal  Echocardiogram 03/25/2020: EF 60 to 65%   She finishes Herceptin today 06/18/2020. Return to clinic in 6 months for follow-up    No orders  of the defined types were placed in this encounter.  The patient has a good understanding of the overall plan. she agrees with it. she will call with any problems that may develop before the next visit here.  Total time spent: 30 mins including face to face time and time spent for planning, charting and coordination of care  Nicholas Lose, MD 06/18/2020  I, Cloyde Reams Dorshimer, am acting as scribe for Dr. Nicholas Lose.  I have reviewed the above documentation for accuracy and completeness, and I agree with the above.

## 2020-06-18 ENCOUNTER — Encounter: Payer: Self-pay | Admitting: *Deleted

## 2020-06-18 ENCOUNTER — Encounter (INDEPENDENT_AMBULATORY_CARE_PROVIDER_SITE_OTHER): Payer: Self-pay

## 2020-06-18 ENCOUNTER — Inpatient Hospital Stay (HOSPITAL_BASED_OUTPATIENT_CLINIC_OR_DEPARTMENT_OTHER): Payer: Medicare Other | Admitting: Hematology and Oncology

## 2020-06-18 ENCOUNTER — Ambulatory Visit: Payer: Medicare Other

## 2020-06-18 ENCOUNTER — Inpatient Hospital Stay: Payer: Medicare Other | Attending: Hematology and Oncology

## 2020-06-18 ENCOUNTER — Other Ambulatory Visit: Payer: Self-pay

## 2020-06-18 DIAGNOSIS — Z5112 Encounter for antineoplastic immunotherapy: Secondary | ICD-10-CM | POA: Insufficient documentation

## 2020-06-18 DIAGNOSIS — Z170 Estrogen receptor positive status [ER+]: Secondary | ICD-10-CM

## 2020-06-18 DIAGNOSIS — C50211 Malignant neoplasm of upper-inner quadrant of right female breast: Secondary | ICD-10-CM | POA: Diagnosis not present

## 2020-06-18 MED ORDER — TRASTUZUMAB-ANNS CHEMO 150 MG IV SOLR
600.0000 mg | Freq: Once | INTRAVENOUS | Status: AC
  Administered 2020-06-18: 600 mg via INTRAVENOUS
  Filled 2020-06-18: qty 28.57

## 2020-06-18 MED ORDER — ACETAMINOPHEN 325 MG PO TABS
ORAL_TABLET | ORAL | Status: AC
  Filled 2020-06-18: qty 2

## 2020-06-18 MED ORDER — DIPHENHYDRAMINE HCL 25 MG PO CAPS
ORAL_CAPSULE | ORAL | Status: AC
  Filled 2020-06-18: qty 2

## 2020-06-18 MED ORDER — SODIUM CHLORIDE 0.9% FLUSH
10.0000 mL | INTRAVENOUS | Status: DC | PRN
  Administered 2020-06-18: 10 mL
  Filled 2020-06-18: qty 10

## 2020-06-18 MED ORDER — SODIUM CHLORIDE 0.9 % IV SOLN
Freq: Once | INTRAVENOUS | Status: AC
  Filled 2020-06-18: qty 250

## 2020-06-18 MED ORDER — DIPHENHYDRAMINE HCL 25 MG PO CAPS
50.0000 mg | ORAL_CAPSULE | Freq: Once | ORAL | Status: AC
  Administered 2020-06-18: 50 mg via ORAL

## 2020-06-18 MED ORDER — HEPARIN SOD (PORK) LOCK FLUSH 100 UNIT/ML IV SOLN
500.0000 [IU] | Freq: Once | INTRAVENOUS | Status: AC | PRN
  Administered 2020-06-18: 500 [IU]
  Filled 2020-06-18: qty 5

## 2020-06-18 MED ORDER — ACETAMINOPHEN 325 MG PO TABS
650.0000 mg | ORAL_TABLET | Freq: Once | ORAL | Status: AC
  Administered 2020-06-18: 650 mg via ORAL

## 2020-06-18 NOTE — Assessment & Plan Note (Signed)
04/19/2019:Screening mammogram detected right breast asymmetry. Diagnostic mammogram and US showed 2 indeterminate right breast masses, 47m at 1 o'clock, 734mat 12 o'clock, with no axillary adenopathy. 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 Stage Ia  05/30/2019: [facility], grade 3, 0.6cm, clear margins, 3 axillary lymph nodes negative.HER-2 positive (3+), ER+ (80%), PR -, Ki67 40%. Stage Ia  Treatment plan: 1.Adjuvant chemo with Taxol Herceptinx9 cycles(stopped for neuropathy)06/27/2019-08/22/2019 2. Adjuvant radiation therapy12/22/2020- 10/27/19 3. Adjuvant antiestrogen therapywith anastrozolestarted2/16/2021 ------------------------------------------------------------------------------------------------------------------------------------------------------- Current treatment:Herceptin maintenance therapy (to be completed September 2021) with anastrozole 12/21/2019: Echocardiogram EF 60 to 65%  Herceptintoxicities:No further issues with diarrhea  Chemo-induced peripheral neuropathy:grade 1, improving Chemotherapy-induced anemia: Hemoglobin is 11.5 today  Breast cancer surveillance:  Mammogram  03/29/2020: Benign breast density category B Bone density: 03/29/2020: T score 0: Normal  Echocardiogram 03/25/2020: EF 60 to 65%   She finishes Herceptin today 06/18/2020. Return to clinic in 6 months for follow-up

## 2020-06-18 NOTE — Patient Instructions (Signed)
[facility] °Discharge Instructions for Patients Receiving Chemotherapy ° °Today you received the following monoclonal antibody agents Trastuzumab-anns (KANJINTI). ° °To help prevent nausea and vomiting after your treatment, we encourage you to take your nausea medication as prescribed. °  °If you develop nausea and vomiting that is not controlled by your nausea medication, call the clinic.  ° °BELOW ARE SYMPTOMS THAT SHOULD BE REPORTED IMMEDIATELY: °· *FEVER GREATER THAN 100.5 F °· *CHILLS WITH OR WITHOUT FEVER °· NAUSEA AND VOMITING THAT IS NOT CONTROLLED WITH YOUR NAUSEA MEDICATION °· *UNUSUAL SHORTNESS OF BREATH °· *UNUSUAL BRUISING OR BLEEDING °· TENDERNESS IN MOUTH AND THROAT WITH OR WITHOUT PRESENCE OF ULCERS °· *URINARY PROBLEMS °· *BOWEL PROBLEMS °· UNUSUAL RASH °Items with * indicate a potential emergency and should be followed up as soon as possible. ° °Feel free to call the clinic should you have any questions or concerns. The clinic phone number is [phone]. ° °Please show the CHEMO ALERT CARD at check-in to the Emergency Department and triage nurse. ° ° °

## 2020-06-19 ENCOUNTER — Telehealth: Payer: Self-pay | Admitting: Hematology and Oncology

## 2020-06-19 NOTE — Telephone Encounter (Signed)
Scheduled per 9/14 los. Called and spoke with pt, confirmed 3/14 appt

## 2020-06-20 ENCOUNTER — Encounter: Payer: Self-pay | Admitting: *Deleted

## 2020-06-26 ENCOUNTER — Ambulatory Visit: Payer: Medicare Other | Admitting: Podiatry

## 2020-07-02 ENCOUNTER — Encounter (INDEPENDENT_AMBULATORY_CARE_PROVIDER_SITE_OTHER): Payer: Self-pay

## 2020-07-03 ENCOUNTER — Encounter: Payer: Self-pay | Admitting: Podiatry

## 2020-07-03 ENCOUNTER — Ambulatory Visit (INDEPENDENT_AMBULATORY_CARE_PROVIDER_SITE_OTHER): Payer: Medicare Other | Admitting: Podiatry

## 2020-07-03 ENCOUNTER — Other Ambulatory Visit: Payer: Self-pay

## 2020-07-03 DIAGNOSIS — L600 Ingrowing nail: Secondary | ICD-10-CM

## 2020-07-03 MED ORDER — NEOMYCIN-POLYMYXIN-HC 3.5-10000-1 OT SOLN: OTIC | 1 refills | Status: DC

## 2020-07-03 NOTE — Progress Notes (Signed)
Subjective:   Patient ID: Kathy Reese, female   DOB: 69 y.o.   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  At this point I went ahead and allowed her to sign consent form and after consent form was signed and reviewed I went ahead and I anesthetized each toe 60 mg like Marcaine mixture sterile prep done and using sterile instrumentation I remove the hallux nails exposed matrix applied phenol 5 applications 30 seconds followed by alcohol lavage sterile dressing gave instructions for soaks and leave dressings on 24 hours but take them off earlier if any throbbing were to occur wrote prescription for drops and encouraged her to call with questions concerns

## 2020-07-03 NOTE — Patient Instructions (Signed)

## 2020-07-05 ENCOUNTER — Telehealth: Payer: Self-pay

## 2020-07-05 NOTE — Telephone Encounter (Signed)
Pt called today and stated that her meds are 80 dollars and she cant afford it. The pt would like to know if there is a cheaper medication that she could get. Please advise.

## 2020-07-08 ENCOUNTER — Other Ambulatory Visit: Payer: Self-pay | Admitting: *Deleted

## 2020-07-08 MED ORDER — ANASTROZOLE 1 MG PO TABS
1.0000 mg | ORAL_TABLET | Freq: Every day | ORAL | 3 refills | Status: DC
Start: 2020-07-08 — End: 2020-12-02

## 2020-07-08 NOTE — Telephone Encounter (Signed)
She does need to fill the prescription. Just continue soaking and bandage with neosporin

## 2020-07-08 NOTE — Telephone Encounter (Signed)
Ive LVM for the pt and updated her with this information.

## 2020-07-09 ENCOUNTER — Other Ambulatory Visit: Payer: Self-pay | Admitting: Hematology and Oncology

## 2020-07-09 ENCOUNTER — Encounter (INDEPENDENT_AMBULATORY_CARE_PROVIDER_SITE_OTHER): Payer: Self-pay

## 2020-07-09 DIAGNOSIS — C50211 Malignant neoplasm of upper-inner quadrant of right female breast: Secondary | ICD-10-CM

## 2020-07-09 DIAGNOSIS — Z170 Estrogen receptor positive status [ER+]: Secondary | ICD-10-CM

## 2020-07-09 NOTE — Telephone Encounter (Signed)
RX refill

## 2020-07-15 ENCOUNTER — Other Ambulatory Visit: Payer: Self-pay | Admitting: General Surgery

## 2020-07-23 ENCOUNTER — Encounter (INDEPENDENT_AMBULATORY_CARE_PROVIDER_SITE_OTHER): Payer: Self-pay

## 2020-07-31 ENCOUNTER — Telehealth: Payer: Self-pay

## 2020-07-31 ENCOUNTER — Other Ambulatory Visit: Payer: Self-pay | Admitting: Radiation Oncology

## 2020-07-31 DIAGNOSIS — Z170 Estrogen receptor positive status [ER+]: Secondary | ICD-10-CM

## 2020-07-31 DIAGNOSIS — C50211 Malignant neoplasm of upper-inner quadrant of right female breast: Secondary | ICD-10-CM

## 2020-07-31 NOTE — Telephone Encounter (Signed)
Patient called yesterday requesting a referral back to PT. She states she has a pulling sensation in her right breast when she raises her right arm. She has tried massage but that is not helping. Patient states she has a small lump in her right breast and was told that is scar tissue. Dr. Sondra Come has written a new order for her to return to PT. Patient advised of the new order now at 505pm and verbalized understanding.

## 2020-08-06 ENCOUNTER — Other Ambulatory Visit: Payer: Self-pay

## 2020-08-06 ENCOUNTER — Ambulatory Visit: Payer: Medicare Other | Attending: Radiation Oncology

## 2020-08-06 DIAGNOSIS — M25611 Stiffness of right shoulder, not elsewhere classified: Secondary | ICD-10-CM | POA: Diagnosis present

## 2020-08-06 DIAGNOSIS — M79621 Pain in right upper arm: Secondary | ICD-10-CM

## 2020-08-06 DIAGNOSIS — M6281 Muscle weakness (generalized): Secondary | ICD-10-CM | POA: Diagnosis present

## 2020-08-06 DIAGNOSIS — I890 Lymphedema, not elsewhere classified: Secondary | ICD-10-CM | POA: Diagnosis present

## 2020-08-06 DIAGNOSIS — R293 Abnormal posture: Secondary | ICD-10-CM | POA: Diagnosis present

## 2020-08-06 NOTE — Therapy (Signed)
[facility], [name], [address] Phone: [phone]   Fax:  [phone]  Physical Therapy Evaluation  Patient Details  Name: Kathy Reese MRN: [id number] Date of Birth: 1950-11-22 Referring Provider (PT): Dr. Sondra Reese   Encounter Date: 08/06/2020   PT End of Session - 08/06/20 1217    Visit Number 1    Number of Visits 13    Date for PT Re-Evaluation 09/17/20    PT Start Time 0807    PT Stop Time 0855    PT Time Calculation (min) 48 min    Activity Tolerance Patient tolerated treatment well           Past Medical History:  Diagnosis Date  . Allergic rhinitis, cause unspecified   . Arthritis   . Depression   . Diverticulosis   . History of Bell's palsy   . History of diabetes mellitus   . Hypersomnia   . OSA on CPAP    not using at this time 06/15/19  . Other and unspecified hyperlipidemia   . Personal history of chemotherapy   . Personal history of malignant neoplasm of ovary   . Personal history of radiation therapy   . Type II or unspecified type diabetes mellitus without mention of complication, not stated as uncontrolled   . Unspecified essential hypertension   . Vertigo     Past Surgical History:  Procedure Laterality Date  . ABDOMINAL ADHESION SURGERY  12/96   exc peritoneal cyst  . BREAST BIOPSY Left 2000   stereotactic, negative  . BREAST BIOPSY Right 05/08/2019  . BREAST LUMPECTOMY Right 05/30/2019  . BREAST LUMPECTOMY WITH RADIOACTIVE SEED AND SENTINEL LYMPH NODE BIOPSY Right 05/30/2019   Procedure: RIGHT BREAST LUMPECTOMY WITH BRACKETED RADIOACTIVE SEEDS  AND SENTINEL LYMPH NODE BIOPSY;  Surgeon: Kathy Klein, MD;  Location: [facility];  Service: General;  Laterality: Right;  . BREAST REDUCTION SURGERY Bilateral 10/03  . EYE SURGERY Left    cataracts  . LAPAROSCOPIC SALPINGO OOPHERECTOMY Right 12/96  . 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 LAPAROSCOPY ABDOMEN DIAGNOSTIC  2/08   w/LOA, LSO  . PORTACATH PLACEMENT Left 06/16/2019   Procedure: INSERTION PORT-A-CATH;  Surgeon: Kathy Klein, MD;  Location: [facility];  Service: General;  Laterality: Left;  . REDUCTION MAMMAPLASTY Bilateral 2004  . TONSILLECTOMY    . TOTAL ABDOMINAL HYSTERECTOMY W/ BILATERAL SALPINGOOPHORECTOMY  5/94   secondary to uterine fibroids  . TOTAL KNEE ARTHROPLASTY Right 05/12/2013   Procedure: TOTAL KNEE ARTHROPLASTY;  Surgeon: Kathy Corning, MD;  Location: [facility];  Service: Orthopedics;  Laterality: Right;  . TRIGGER FINGER RELEASE  07/06/2012   Procedure: RELEASE TRIGGER FINGER/A-1 PULLEY;  Surgeon: Kathy Corning, MD;  Location: [facility];  Service: Orthopedics;  Laterality: Left;  left ring finger  . TUBAL LIGATION      There were no vitals filed for this visit.    Subjective Assessment - 08/06/20 0812    Subjective Was feeling better but am still having tightness and pulling in chest and right armpit area. 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  Avoids lifting with the right side, difficulty reaching to cabinets, can't wash back now.    Limitations House hold activities;Lifting    Patient Stated Goals Be able to use right UE to carry bags, reach , and do household chores    Currently in Pain? No/denies  [facility] PT Assessment - 08/06/20 0001      Assessment   Medical Diagnosis Right breast CA s/p SLNB    Referring Provider (PT) Dr. Sondra Reese    Onset Date/Surgical Date 05/30/19    Hand Dominance Right    Prior Therapy yes 6/3 -05/01/20      Restrictions   Weight Bearing Restrictions No      Balance Screen   Has the patient fallen in the past 6 months No    Has the patient had a decrease in activity level because of a fear of falling?  No    Is the patient reluctant to leave their home because of a fear of falling?  No      Home Environment   Living Environment Private residence    Living  Arrangements Spouse/significant other    Available Help at Discharge Family      Prior Function   Level of Independence Independent      Cognition   Overall Cognitive Status Within Functional Limits for tasks assessed      Observation/Other Assessments   Skin Integrity WNL      ROM / Strength   AROM / PROM / Strength AROM      AROM   Right Shoulder Extension 40 Degrees    Right Shoulder Flexion 85 Degrees    Right Shoulder ABduction 70 Degrees    Right Shoulder Internal Rotation 40 Degrees   IR behind Back L2   Right Shoulder External Rotation 65 Degrees    Left Shoulder Extension 54 Degrees    Left Shoulder Flexion 144 Degrees    Left Shoulder ABduction 145 Degrees    Left Shoulder Internal Rotation 60 Degrees   IR behind back T10)   Left Shoulder External Rotation 85 Degrees             LYMPHEDEMA/ONCOLOGY QUESTIONNAIRE - 08/06/20 0001      Surgeries   Lumpectomy Date 05/30/19    Sentinel Lymph Node Biopsy Date 05/30/19    Number Lymph Nodes Removed 3      Treatment   Active Chemotherapy Treatment No    Past Chemotherapy Treatment Yes    Active Radiation Treatment No    Past Radiation Treatment Yes      What other symptoms do you have   Are you Having Heaviness or Tightness Yes    Is it Hard or Difficult finding clothes that fit Yes    Do you have infections No    Is there Decreased scar mobility Yes    Other Symptoms --   firmness right upper breast     Right Upper Extremity Lymphedema   10 cm Proximal to Olecranon Process 40.3 cm    Olecranon Process 29 cm    15 cm Proximal to Ulnar Styloid Process 26 cm    Just Proximal to Ulnar Styloid Process 18 cm    Across Hand at [facility] 19.4 cm    At [place] of 2nd Digit 6.8 cm      Left Upper Extremity Lymphedema   10 cm Proximal to Olecranon Process 38.5 cm    Olecranon Process 29.7 cm    10 cm Proximal to Ulnar Styloid Process 26 cm    Just Proximal to Ulnar Styloid Process 17.6 cm    Across Hand at  [facility] 19.3 cm    At [place] of 2nd Digit 6.3 cm  [facility] - 08/06/20 0001    Open a tight or new jar Severe difficulty    Do heavy household chores (wash walls, wash floors) Moderate difficulty    Carry a shopping bag or briefcase Moderate difficulty    Wash your back Severe difficulty    Use a knife to cut food Severe difficulty    Recreational activities in which you take some force or impact through your arm, shoulder, or hand (golf, hammering, tennis) Moderate difficulty    During the past week, to what extent has your arm, shoulder or hand problem interfered with your normal social activities with family, friends, neighbors, or groups? Modererately    During the past week, to what extent has your arm, shoulder or hand problem limited your work or other regular daily activities Modererately    Arm, shoulder, or hand pain. Moderate    Tingling (pins and needles) in your arm, shoulder, or hand Moderate    Difficulty Sleeping Mild difficulty    DASH Score 54.55 %            Objective measurements completed on examination: See above findings.               PT Education - 08/06/20 0857    Education Details Pt was educated in supine clasped hand flexion and supine hands behind stretch to perform 2-3 times per day for 5 reps to improve ROM.  Discussed placing chip pack over area of firmness in right breast.    Person(s) Educated Patient    Comprehension Verbalized understanding;Returned demonstration               PT Long Term Goals - 08/06/20 1226      PT LONG TERM GOAL #1   Title Pt will demonstrate 140 degrees of R shoulder flexion to allow her to reach overhead.    Time 4    Period Weeks    Status New    Target Date 09/03/20      PT LONG TERM GOAL #2   Title Pt will demonstrates 160 degrees of R shoulder abduction to allow her to reach out to the side.    Time 6    Period Weeks    Status New    Target Date 09/17/20       PT LONG TERM GOAL #3   Title Pt will be able to lift R arm above head without being limited by tightness and discomfort in R axilla/chest    Time 4    Period Weeks    Status New      PT LONG TERM GOAL #4   Title Pt will be independent in a home exercise program for continued strengthening and stretching.    Time 4    Status New      PT LONG TERM GOAL #5   Title Pt will report she is confident she can continue to manage her symptoms on her own at home with exercise and use of compression    Time 6    Period Weeks    Status New      Additional Long Term Goals   Additional Long Term Goals Yes      PT LONG TERM GOAL #6   Title Pt. will have quick dash no greater than 15%    Baseline 55%    Time 6    Period Weeks    Status New  Plan - 08/06/20 1218    Clinical Impression Statement Pt presents with limitations in Right shoulder AROM with tightness present in axillary region.  Also notes firmness in a small area of right upper breast that would benefit from a chip pack.  She was educated to try her ROM exs for flexion and ER in supine for better stretch    Personal Factors and Comorbidities Comorbidity 1;Comorbidity 2    Comorbidities SLNB, diabetes    Examination-Activity Limitations Lift;Carry;Reach Overhead    Examination-Participation Restrictions Cleaning    Stability/Clinical Decision Making Stable/Uncomplicated    Clinical Decision Making Low    PT Frequency 2x / week    PT Duration 6 weeks    PT Treatment/Interventions ADLs/Self Care Home Management;Therapeutic exercise;Neuromuscular re-education;Manual techniques;Patient/family education;Manual lymph drainage;Passive range of motion    PT Next Visit Plan consider chip pack for right upper breast area of fibrosis    PT Home Exercise Plan continue post op exs;  AA flex and ER in supine 2-3 times per day    Consulted and Agree with Plan of Care Patient           Patient will benefit from skilled  therapeutic intervention in order to improve the following deficits and impairments:  Decreased activity tolerance, Decreased mobility, Decreased strength, Increased edema, Pain, Impaired UE functional use, Decreased range of motion, Decreased knowledge of precautions  Visit Diagnosis: Stiffness of right shoulder, not elsewhere classified  Abnormal posture  Lymphedema, not elsewhere classified  Muscle weakness (generalized)  Pain in right upper arm     Problem List Patient Active Problem List   Diagnosis Date Noted  . Chemotherapy-induced peripheral neuropathy ([place]) 03/26/2020  . Port-A-Cath in place 07/11/2019  . Malignant neoplasm of upper-inner quadrant of right breast in female, estrogen receptor positive ([place]) 04/19/2019  . Facial weakness 11/07/2018  . 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 History of hysterectomy 10/12/2018  . History of bilateral salpingo-oophorectomy (BSO) 10/12/2018  . Osteoarthritis of right knee 05/12/2013  . HYPERLIPIDEMIA 12/30/2007  . HYPERTENSION 12/30/2007  . INSOMNIA 12/30/2007  . HYPERSOMNIA 12/30/2007  . DM w/o Complication Type II 88/91/6945  . HYPERCHOLESTEROLEMIA 12/16/2007  . EXOGENOUS OBESITY 12/16/2007  . SLEEP APNEA, OBSTRUCTIVE 12/16/2007  . ALLERGIC RHINITIS 12/16/2007    Elsie Ra [facility] 08/06/2020, 12:30 PM  [place], [name], [address] Phone: [phone]   Fax:  [phone]  Name: Kathy Reese MRN: [id number] Date of Birth: [birth year]

## 2020-08-08 ENCOUNTER — Other Ambulatory Visit: Payer: Self-pay

## 2020-08-08 ENCOUNTER — Ambulatory Visit: Payer: Medicare Other

## 2020-08-08 DIAGNOSIS — M79621 Pain in right upper arm: Secondary | ICD-10-CM

## 2020-08-08 DIAGNOSIS — M25611 Stiffness of right shoulder, not elsewhere classified: Secondary | ICD-10-CM | POA: Diagnosis not present

## 2020-08-08 DIAGNOSIS — R293 Abnormal posture: Secondary | ICD-10-CM

## 2020-08-08 DIAGNOSIS — M6281 Muscle weakness (generalized): Secondary | ICD-10-CM

## 2020-08-08 DIAGNOSIS — I890 Lymphedema, not elsewhere classified: Secondary | ICD-10-CM

## 2020-08-08 NOTE — Therapy (Signed)
[place], [name], [address] Phone: [phone]   Fax:  [phone]  Physical Therapy Treatment  Patient Details  Name: Kathy Reese MRN: [id number] Date of Birth: September 15, 1951 Referring Provider (PT): Dr. Sondra Come   Encounter Date: 08/08/2020   PT End of Session - 08/08/20 0849    Visit Number 2    Number of Visits 13    Date for PT Re-Evaluation 09/17/20    PT Start Time 0807    PT Stop Time 0853    PT Time Calculation (min) 46 min    Activity Tolerance Patient tolerated treatment well    Behavior During Therapy [facility] for tasks assessed/performed           Past Medical History:  Diagnosis Date  . Allergic rhinitis, cause unspecified   . Arthritis   . Depression   . Diverticulosis   . History of Bell's palsy   . History of diabetes mellitus   . Hypersomnia   . OSA on CPAP    not using at this time 06/15/19  . Other and unspecified hyperlipidemia   . Personal history of chemotherapy   . Personal history of malignant neoplasm of ovary   . Personal history of radiation therapy   . Type II or unspecified type diabetes mellitus without mention of complication, not stated as uncontrolled   . Unspecified essential hypertension   . Vertigo     Past Surgical History:  Procedure Laterality Date  . ABDOMINAL ADHESION SURGERY  12/96   exc peritoneal cyst  . BREAST BIOPSY Left 2000   stereotactic, negative  . BREAST BIOPSY Right 05/08/2019  . BREAST LUMPECTOMY Right 05/30/2019  . BREAST LUMPECTOMY WITH RADIOACTIVE SEED AND SENTINEL LYMPH NODE BIOPSY Right 05/30/2019   Procedure: RIGHT BREAST LUMPECTOMY WITH BRACKETED RADIOACTIVE SEEDS  AND SENTINEL LYMPH NODE BIOPSY;  Surgeon: Stark Klein, MD;  Location: [place];  Service: General;  Laterality: Right;  . BREAST REDUCTION SURGERY Bilateral 10/03  . EYE SURGERY Left    cataracts  . LAPAROSCOPIC SALPINGO OOPHERECTOMY Right 12/96  .  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 LAPAROSCOPY ABDOMEN DIAGNOSTIC  2/08   w/LOA, LSO  . PORTACATH PLACEMENT Left 06/16/2019   Procedure: INSERTION PORT-A-CATH;  Surgeon: Stark Klein, MD;  Location: [place];  Service: General;  Laterality: Left;  . REDUCTION MAMMAPLASTY Bilateral 2004  . TONSILLECTOMY    . TOTAL ABDOMINAL HYSTERECTOMY W/ BILATERAL SALPINGOOPHORECTOMY  5/94   secondary to uterine fibroids  . TOTAL KNEE ARTHROPLASTY Right 05/12/2013   Procedure: TOTAL KNEE ARTHROPLASTY;  Surgeon: [place] Corning, MD;  Location: [place];  Service: Orthopedics;  Laterality: Right;  . TRIGGER FINGER RELEASE  07/06/2012   Procedure: RELEASE TRIGGER FINGER/A-1 PULLEY;  Surgeon: [place]ta Corning, MD;  Location: [place];  Service: Orthopedics;  Laterality: Left;  left ring finger  . TUBAL LIGATION      There were no vitals filed for this visit.   Subjective Assessment - 08/08/20 0806    Subjective Have been using the chip pack and doing the exercises.Shoulder is improving but I still have a pull at my chest .  No present pain    Patient Stated Goals Be able to use right UE to carry bags, reach , and do household chores    Currently in Pain? No/denies  [place] [facility] PT Treatment/Exercise - 08/08/20 0001      Shoulder Exercises: Pulleys   Flexion 2 minutes    ABduction 2 minutes      Shoulder Exercises: Therapy Ball   Flexion Both;10 reps      Manual Therapy   Manual Therapy Edema management;Manual Lymphatic Drainage (MLD);Passive ROM    Edema Management chip pack in bra to decrease upper breast swelling    Manual Lymphatic Drainage (MLD) short neck, left axillary LN, Right inguinal LN, Anterior interaxillary pathway, Right axillo-inguinal pathway and right breast in supine with head of table elevated repeating all steps.    Passive ROM PROM right shoulder flex, abd, D2 flex and ER                        PT Long Term Goals - 08/06/20 1226      PT LONG TERM GOAL #1   Title Pt will demonstrate 140 degrees of R shoulder flexion to allow her to reach overhead.    Time 4    Period Weeks    Status New    Target Date 09/03/20      PT LONG TERM GOAL #2   Title Pt will demonstrates 160 degrees of R shoulder abduction to allow her to reach out to the side.    Time 6    Period Weeks    Status New    Target Date 09/17/20      PT LONG TERM GOAL #3   Title Pt will be able to lift R arm above head without being limited by tightness and discomfort in R axilla/chest    Time 4    Period Weeks    Status New      PT LONG TERM GOAL #4   Title Pt will be independent in a home exercise program for continued strengthening and stretching.    Time 4    Status New      PT LONG TERM GOAL #5   Title Pt will report she is confident she can continue to manage her symptoms on her own at home with exercise and use of compression    Time 6    Period Weeks    Status New      Additional Long Term Goals   Additional Long Term Goals Yes      PT LONG TERM GOAL #6   Title Pt. will have quick dash no greater than 15%    Baseline 55%    Time 6    Period Weeks    Status New                 Plan - 08/08/20 0851    Clinical Impression Statement Pt. getting benefit of chip pack to soften upper breast    Personal Factors and Comorbidities Comorbidity 1;Comorbidity 2    Examination-Activity Limitations Lift;Carry;Reach Overhead    Examination-Participation Restrictions Cleaning    Stability/Clinical Decision Making Stable/Uncomplicated    Rehab Potential Excellent    PT Frequency 2x / week    PT Duration 6 weeks    PT Treatment/Interventions ADLs/Self Care Home Management;Therapeutic exercise;Neuromuscular re-education;Manual techniques;Patient/family education;Manual lymph drainage;Passive range of motion    PT Next Visit Plan continue chip pack, PROM right shoulder,  MLD right breast    PT Home Exercise Plan continue post op exs;  AA flex and ER in supine 2-3 times per day    Consulted and Agree with Plan of Care Patient  Patient will benefit from skilled therapeutic intervention in order to improve the following deficits and impairments:  Decreased activity tolerance, Decreased mobility, Decreased strength, Increased edema, Pain, Impaired UE functional use, Decreased range of motion, Decreased knowledge of precautions  Visit Diagnosis: Abnormal posture  Lymphedema, not elsewhere classified  Muscle weakness (generalized)  Pain in right upper arm     Problem List Patient Active Problem List   Diagnosis Date Noted  . Chemotherapy-induced peripheral neuropathy ([place]) 03/26/2020  . Port-A-Cath in place 07/11/2019  . Malignant neoplasm of upper-inner quadrant of right breast in female, estrogen receptor positive ([place]) 04/19/2019  . Facial weakness 11/07/2018  . 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 History of hysterectomy 10/12/2018  . History of bilateral salpingo-oophorectomy (BSO) 10/12/2018  . Osteoarthritis of right knee 05/12/2013  . HYPERLIPIDEMIA 12/30/2007  . HYPERTENSION 12/30/2007  . INSOMNIA 12/30/2007  . HYPERSOMNIA 12/30/2007  . DM w/o Complication Type II 11/73/5670  . HYPERCHOLESTEROLEMIA 12/16/2007  . EXOGENOUS OBESITY 12/16/2007  . SLEEP APNEA, OBSTRUCTIVE 12/16/2007  . ALLERGIC RHINITIS 12/16/2007    Elsie Ra [facility] 08/08/2020, 8:57 AM  [place], [name], [address] Phone: [phone]   Fax:  [phone]  Name: [facility] MRN: [id number] Date of Birth: [birth year]

## 2020-08-13 ENCOUNTER — Ambulatory Visit: Payer: Medicare Other

## 2020-08-13 ENCOUNTER — Other Ambulatory Visit: Payer: Self-pay

## 2020-08-13 DIAGNOSIS — R293 Abnormal posture: Secondary | ICD-10-CM

## 2020-08-13 DIAGNOSIS — M6281 Muscle weakness (generalized): Secondary | ICD-10-CM

## 2020-08-13 DIAGNOSIS — M79621 Pain in right upper arm: Secondary | ICD-10-CM

## 2020-08-13 DIAGNOSIS — I890 Lymphedema, not elsewhere classified: Secondary | ICD-10-CM

## 2020-08-13 DIAGNOSIS — M25611 Stiffness of right shoulder, not elsewhere classified: Secondary | ICD-10-CM

## 2020-08-13 NOTE — Therapy (Signed)
[place], [name], [address] Phone: [phone]   Fax:  [phone]  Physical Therapy Treatment  Patient Details  Name: Kathy Reese MRN: [id number] Date of Birth: 16-Oct-1950 Referring Provider (PT): Dr. Sondra Come   Encounter Date: 08/13/2020   PT End of Session - 08/13/20 1008    Visit Number 3    Number of Visits 13    Date for PT Re-Evaluation 09/17/20    PT Start Time 0905    PT Stop Time 1007    PT Time Calculation (min) 62 min    Activity Tolerance Patient tolerated treatment well    Behavior During Therapy [facility] for tasks assessed/performed           Past Medical History:  Diagnosis Date  . Allergic rhinitis, cause unspecified   . Arthritis   . Depression   . Diverticulosis   . History of Bell's palsy   . History of diabetes mellitus   . Hypersomnia   . OSA on CPAP    not using at this time 06/15/19  . Other and unspecified hyperlipidemia   . Personal history of chemotherapy   . Personal history of malignant neoplasm of ovary   . Personal history of radiation therapy   . Type II or unspecified type diabetes mellitus without mention of complication, not stated as uncontrolled   . Unspecified essential hypertension   . Vertigo     Past Surgical History:  Procedure Laterality Date  . ABDOMINAL ADHESION SURGERY  12/96   exc peritoneal cyst  . BREAST BIOPSY Left 2000   stereotactic, negative  . BREAST BIOPSY Right 05/08/2019  . BREAST LUMPECTOMY Right 05/30/2019  . BREAST LUMPECTOMY WITH RADIOACTIVE SEED AND SENTINEL LYMPH NODE BIOPSY Right 05/30/2019   Procedure: RIGHT BREAST LUMPECTOMY WITH BRACKETED RADIOACTIVE SEEDS  AND SENTINEL LYMPH NODE BIOPSY;  Surgeon: Stark Klein, MD;  Location: [facility];  Service: General;  Laterality: Right;  . BREAST REDUCTION SURGERY Bilateral 10/03  . EYE SURGERY Left    cataracts  . LAPAROSCOPIC SALPINGO OOPHERECTOMY Right 12/96  .  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 LAPAROSCOPY ABDOMEN DIAGNOSTIC  2/08   w/LOA, LSO  . PORTACATH PLACEMENT Left 06/16/2019   Procedure: INSERTION PORT-A-CATH;  Surgeon: Stark Klein, MD;  Location: [facility];  Service: General;  Laterality: Left;  . REDUCTION MAMMAPLASTY Bilateral 2004  . TONSILLECTOMY    . TOTAL ABDOMINAL HYSTERECTOMY W/ BILATERAL SALPINGOOPHORECTOMY  5/94   secondary to uterine fibroids  . TOTAL KNEE ARTHROPLASTY Right 05/12/2013   Procedure: TOTAL KNEE ARTHROPLASTY;  Surgeon: Alta Corning, MD;  Location: [facility];  Service: Orthopedics;  Laterality: Right;  . TRIGGER FINGER RELEASE  07/06/2012   Procedure: RELEASE TRIGGER FINGER/A-1 PULLEY;  Surgeon: Alta Corning, MD;  Location: [facility];  Service: Orthopedics;  Laterality: Left;  left ring finger  . TUBAL LIGATION      There were no vitals filed for this visit.   Subjective Assessment - 08/13/20 0909    Subjective Still using the chip pack, it may be helping soften my breast some.    Patient Stated Goals Be able to use right UE to carry bags, reach , and do household chores    Currently in Pain? No/denies  [place] Adult PT Treatment/Exercise - 08/13/20 0001      Shoulder Exercises: Pulleys   Flexion 2 minutes    Flexion Limitations VCs to remind pt to decrease scapular compensation    ABduction 2 minutes    ABduction Limitations Tactile and VCs to decrease Rt scapular compensation      Shoulder Exercises: Therapy Ball   Flexion Both;10 reps   forward lean into end of stretch   Flexion Limitations Tactile cues to decrease Rt scapular compensation    ABduction Right;5 reps   same side lean into end of stretch   ABduction Limitations Tactile cues to decrease scap compensation and not to push into pain      Manual Therapy   Manual Therapy Soft tissue mobilization;Manual Lymphatic Drainage (MLD);Passive  ROM    Soft tissue mobilization To Rt pectoralis tendon insertion where pt c/o most pull, this seemed to soften by end of session and passive motion improved    Manual Lymphatic Drainage (MLD) In Supine with HOB elevated: Short neck, superficial and deep abdominals, left axillary LN, Right inguinal LN, Anterior interaxillary pathway, Right axillo-inguinal pathway and right breast, focusing mostly on superior aspect where fibrosis palpable, then briefly in Rt S/L for further work at lateral breast towards posterior inter-axillary anastomosis, then finished retracing all steps in supine.     Passive ROM PROM right shoulder flexion and abduction                       PT Long Term Goals - 08/06/20 1226      PT LONG TERM GOAL #1   Title Pt will demonstrate 140 degrees of R shoulder flexion to allow her to reach overhead.    Time 4    Period Weeks    Status New    Target Date 09/03/20      PT LONG TERM GOAL #2   Title Pt will demonstrates 160 degrees of R shoulder abduction to allow her to reach out to the side.    Time 6    Period Weeks    Status New    Target Date 09/17/20      PT LONG TERM GOAL #3   Title Pt will be able to lift R arm above head without being limited by tightness and discomfort in R axilla/chest    Time 4    Period Weeks    Status New      PT LONG TERM GOAL #4   Title Pt will be independent in a home exercise program for continued strengthening and stretching.    Time 4    Status New      PT LONG TERM GOAL #5   Title Pt will report she is confident she can continue to manage her symptoms on her own at home with exercise and use of compression    Time 6    Period Weeks    Status New      Additional Long Term Goals   Additional Long Term Goals Yes      PT LONG TERM GOAL #6   Title Pt. will have quick dash no greater than 15%    Baseline 55%    Time 6    Period Weeks    Status New                 Plan - 08/13/20 1201    Clinical  Impression Statement Pt continuing with wear of chip pack in sports bra.  Superior aspect of Rt breast seemed some softer by end of MLD session. Also pt benefitted from [facility] to Rt pectoralis insertion as her end P/ROM improved by end of session as did the reduction of report of pulling into breast at end motion, especially flexion. Progressed pt to include AA/ROM exercises today as well. She needed mod tactile and VCs for each for correct technique as she was overcompensating with Rt scapula. She was able to report less discomfort with correct technique.    Personal Factors and Comorbidities Comorbidity 1;Comorbidity 2    Comorbidities SLNB, diabetes    Examination-Activity Limitations Lift;Carry;Reach Overhead    Examination-Participation Restrictions Cleaning    Stability/Clinical Decision Making Stable/Uncomplicated    Rehab Potential Excellent    PT Frequency 2x / week    PT Duration 6 weeks    PT Treatment/Interventions ADLs/Self Care Home Management;Therapeutic exercise;Neuromuscular re-education;Manual techniques;Patient/family education;Manual lymph drainage;Passive range of motion    PT Next Visit Plan continue PROM right shoulder, MLD right breast instructing pt in this and having her return demo, and progress ROM exercises    PT Home Exercise Plan continue post op exs;  AA flex and ER in supine 2-3 times per day    Consulted and Agree with Plan of Care Patient           Patient will benefit from skilled therapeutic intervention in order to improve the following deficits and impairments:  Decreased activity tolerance, Decreased mobility, Decreased strength, Increased edema, Pain, Impaired UE functional use, Decreased range of motion, Decreased knowledge of precautions  Visit Diagnosis: Stiffness of right shoulder, not elsewhere classified  Abnormal posture  Lymphedema, not elsewhere classified  Muscle weakness (generalized)  Pain in right upper arm     Problem List Patient  Active Problem List   Diagnosis Date Noted  . Chemotherapy-induced peripheral neuropathy ([facility]) 03/26/2020  . Port-A-Cath in place 07/11/2019  . Malignant neoplasm of upper-inner quadrant of right breast in female, estrogen receptor positive ([place]) 04/19/2019  . Facial weakness 11/07/2018  . 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 History of hysterectomy 10/12/2018  . History of bilateral salpingo-oophorectomy (BSO) 10/12/2018  . Osteoarthritis of right knee 05/12/2013  . HYPERLIPIDEMIA 12/30/2007  . HYPERTENSION 12/30/2007  . INSOMNIA 12/30/2007  . HYPERSOMNIA 12/30/2007  . DM w/o Complication Type II 49/20/1007  . HYPERCHOLESTEROLEMIA 12/16/2007  . EXOGENOUS OBESITY 12/16/2007  . SLEEP APNEA, OBSTRUCTIVE 12/16/2007  . ALLERGIC RHINITIS 12/16/2007    Otelia Limes, PTA 08/13/2020, 12:09 PM  [place], [name], [address] Phone: [phone]   Fax:  [phone]  Name: Kathy Reese MRN: [id number] Date of Birth: 30-Jun-1951

## 2020-08-15 ENCOUNTER — Other Ambulatory Visit: Payer: Self-pay

## 2020-08-15 ENCOUNTER — Ambulatory Visit: Payer: Medicare Other

## 2020-08-15 DIAGNOSIS — M25611 Stiffness of right shoulder, not elsewhere classified: Secondary | ICD-10-CM | POA: Diagnosis not present

## 2020-08-15 DIAGNOSIS — R293 Abnormal posture: Secondary | ICD-10-CM

## 2020-08-15 DIAGNOSIS — M6281 Muscle weakness (generalized): Secondary | ICD-10-CM

## 2020-08-15 DIAGNOSIS — I890 Lymphedema, not elsewhere classified: Secondary | ICD-10-CM

## 2020-08-15 DIAGNOSIS — M79621 Pain in right upper arm: Secondary | ICD-10-CM

## 2020-08-15 NOTE — Therapy (Signed)
[place], [name], [address] Phone: [phone]   Fax:  [phone]  Physical Therapy Treatment  Patient Details  Name: Kathy Reese MRN: [id number] Date of Birth: [birth year] Referring Provider (PT): Dr. Sondra Come   Encounter Date: 08/15/2020   PT End of Session - 08/15/20 0853    Visit Number 4    Date for PT Re-Evaluation 09/17/20    PT Start Time 0806    PT Stop Time 0855    PT Time Calculation (min) 49 min    Activity Tolerance Patient tolerated treatment well    Behavior During Therapy [facility] for tasks assessed/performed           Past Medical History:  Diagnosis Date  . Allergic rhinitis, cause unspecified   . Arthritis   . Depression   . Diverticulosis   . History of Bell's palsy   . History of diabetes mellitus   . Hypersomnia   . OSA on CPAP    not using at this time 06/15/19  . Other and unspecified hyperlipidemia   . Personal history of chemotherapy   . Personal history of malignant neoplasm of ovary   . Personal history of radiation therapy   . Type II or unspecified type diabetes mellitus without mention of complication, not stated as uncontrolled   . Unspecified essential hypertension   . Vertigo     Past Surgical History:  Procedure Laterality Date  . ABDOMINAL ADHESION SURGERY  12/96   exc peritoneal cyst  . BREAST BIOPSY Left 2000   stereotactic, negative  . BREAST BIOPSY Right 05/08/2019  . BREAST LUMPECTOMY Right 05/30/2019  . BREAST LUMPECTOMY WITH RADIOACTIVE SEED AND SENTINEL LYMPH NODE BIOPSY Right 05/30/2019   Procedure: RIGHT BREAST LUMPECTOMY WITH BRACKETED RADIOACTIVE SEEDS  AND SENTINEL LYMPH NODE BIOPSY;  Surgeon: Stark Klein, MD;  Location: [facility];  Service: General;  Laterality: Right;  . BREAST REDUCTION SURGERY Bilateral 10/03  . EYE SURGERY Left    cataracts  . LAPAROSCOPIC SALPINGO OOPHERECTOMY Right 12/96  . 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 LAPAROSCOPY ABDOMEN DIAGNOSTIC  2/08   w/LOA, LSO  . PORTACATH PLACEMENT Left 06/16/2019   Procedure: INSERTION PORT-A-CATH;  Surgeon: Stark Klein, MD;  Location: [facility];  Service: General;  Laterality: Left;  . REDUCTION MAMMAPLASTY Bilateral 2004  . TONSILLECTOMY    . TOTAL ABDOMINAL HYSTERECTOMY W/ BILATERAL SALPINGOOPHORECTOMY  5/94   secondary to uterine fibroids  . TOTAL KNEE ARTHROPLASTY Right 05/12/2013   Procedure: TOTAL KNEE ARTHROPLASTY;  Surgeon: Alta Corning, MD;  Location: [facility];  Service: Orthopedics;  Laterality: Right;  . TRIGGER FINGER RELEASE  07/06/2012   Procedure: RELEASE TRIGGER FINGER/A-1 PULLEY;  Surgeon: Alta Corning, MD;  Location: [facility];  Service: Orthopedics;  Laterality: Left;  left ring finger  . TUBAL LIGATION      There were no vitals filed for this visit.   Subjective Assessment - 08/15/20 0806    Subjective Feel like the breast is softening a little with the chip pack. Still feel some pulling in the axillary region.  Feel like shoulder ROM is improved.    Currently in Pain? No/denies                             [facility] Adult PT Treatment/Exercise - 08/15/20 0001  Shoulder Exercises: Pulleys   Flexion 2 minutes    ABduction 2 minutes      Shoulder Exercises: Therapy Ball   Flexion Both;10 reps   forward lean into end of stretch   Flexion Limitations Tactile cues to decrease Rt scapular compensation    ABduction Right;5 reps   same side lean into end of stretch   ABduction Limitations Tactile cues to decrease scap compensation and not to push into pain      Manual Therapy   Manual Therapy Edema management;Manual Lymphatic Drainage (MLD);Passive ROM    Edema Management Chip pack in bra    Manual Lymphatic Drainage (MLD) In Supine with HOB elevated: Short neck,  left axillary LN, Right inguinal LN, Anterior interaxillary pathway, Right  axillo-inguinal pathway and right breast, focusing mostly on superior aspect where fibrosis palpable, then all steps in supine.  Instructed pt in proper technique in sitting and  in supine.    Passive ROM PROM right shoulder flexion and abduction                  PT Education - 08/15/20 0852    Education Details Pt was educated in self MLD to right breast and given illustrated and written instuctions    Person(s) Educated Patient    Methods Demonstration;Tactile cues;Handout;Explanation    Comprehension Need further instruction;Tactile cues required;Returned demonstration;Verbal cues required               PT Long Term Goals - 08/06/20 1226      PT LONG TERM GOAL #1   Title Pt will demonstrate 140 degrees of R shoulder flexion to allow her to reach overhead.    Time 4    Period Weeks    Status New    Target Date 09/03/20      PT LONG TERM GOAL #2   Title Pt will demonstrates 160 degrees of R shoulder abduction to allow her to reach out to the side.    Time 6    Period Weeks    Status New    Target Date 09/17/20      PT LONG TERM GOAL #3   Title Pt will be able to lift R arm above head without being limited by tightness and discomfort in R axilla/chest    Time 4    Period Weeks    Status New      PT LONG TERM GOAL #4   Title Pt will be independent in a home exercise program for continued strengthening and stretching.    Time 4    Status New      PT LONG TERM GOAL #5   Title Pt will report she is confident she can continue to manage her symptoms on her own at home with exercise and use of compression    Time 6    Period Weeks    Status New      Additional Long Term Goals   Additional Long Term Goals Yes      PT LONG TERM GOAL #6   Title Pt. will have quick dash no greater than 15%    Baseline 55%    Time 6    Period Weeks    Status New                 Plan - 08/15/20 0854    Clinical Impression Statement Pt is getting good benefit from chip  pack and breast is much softer.  Treatment consisted of MLD and  pt instruction in MLD, as well as PROM and exercises.  She continues with greatest limitation in abd ROM with tightness in axillary region,  She was given written instructions for MLD but she will require much more practice.    Personal Factors and Comorbidities Comorbidity 1;Comorbidity 2    Comorbidities SLNB, diabetes    Examination-Activity Limitations Lift;Carry;Reach Overhead    Examination-Participation Restrictions Cleaning    Stability/Clinical Decision Making Stable/Uncomplicated    Rehab Potential Excellent    PT Frequency 2x / week    PT Duration 6 weeks    PT Treatment/Interventions ADLs/Self Care Home Management;Therapeutic exercise;Neuromuscular re-education;Manual techniques;Patient/family education;Manual lymph drainage;Passive range of motion    PT Next Visit Plan continue PROM right shoulder, MLD right breast instructing pt in this and having her return demo, and progress ROM exercises    PT Home Exercise Plan continue post op exs;  AA flex and ER in supine 2-3 times per day, start to practice MLD    Consulted and Agree with Plan of Care Patient           Patient will benefit from skilled therapeutic intervention in order to improve the following deficits and impairments:  Decreased activity tolerance, Decreased mobility, Decreased strength, Increased edema, Pain, Impaired UE functional use, Decreased range of motion, Decreased knowledge of precautions  Visit Diagnosis: Stiffness of right shoulder, not elsewhere classified  Abnormal posture  Lymphedema, not elsewhere classified  Muscle weakness (generalized)  Pain in right upper arm     Problem List Patient Active Problem List   Diagnosis Date Noted  . Chemotherapy-induced peripheral neuropathy ([place]) 03/26/2020  . Port-A-Cath in place 07/11/2019  . Malignant neoplasm of upper-inner quadrant of right breast in female, estrogen receptor positive  ([place]) 04/19/2019  . Facial weakness 11/07/2018  . 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 History of hysterectomy 10/12/2018  . History of bilateral salpingo-oophorectomy (BSO) 10/12/2018  . Osteoarthritis of right knee 05/12/2013  . HYPERLIPIDEMIA 12/30/2007  . HYPERTENSION 12/30/2007  . INSOMNIA 12/30/2007  . HYPERSOMNIA 12/30/2007  . DM w/o Complication Type II 29/51/8841  . HYPERCHOLESTEROLEMIA 12/16/2007  . EXOGENOUS OBESITY 12/16/2007  . SLEEP APNEA, OBSTRUCTIVE 12/16/2007  . ALLERGIC RHINITIS 12/16/2007    Elsie Ra [facility] 08/15/2020, 9:09 AM  [place], [name], [address] Phone: [phone]   Fax:  [phone]  Name: Kathy Reese MRN: [id number] Date of Birth: Apr 04, 1951

## 2020-08-15 NOTE — Patient Instructions (Signed)
Manual Lymph Drainage for Right Breast.  Do daily.  Do slowly. Use flat hands with just enough pressure to stretch the skin. Do not slide over the skin, but move the skin with the hand you're using. Lie down or sit comfortably (in a recliner, for example) to do this.  1) Hug yourself:  cross arms and do circles at collar bones near neck 5-7 times (to "wake up" lots of lymph nodes in this area). 2) Take slow deep breaths, allowing your belly to balloon out as your breathe in, 5x (to "wake up" abdominal lymph nodes to take on extra fluid). 3) Left armpit--stretch skin in small circles to stimulate intact lymph nodes there, 5-7x. 4) Right groin area, at panty line--stretch skin in small circles to stimulate lymph nodes 5-7x. 5) Redirect fluid from right chest toward left armpit (stretch skin starting at right chest in 3-4 spots working toward left armpit) 3-4x across the chest. 6) Redirect fluid from right armpit toward right groin (cup your hand around the curve of your right side and do 3-4 "pumps" from armpit to groin) 3-4x down your side. 7) Draw an imaginary diagonal line from upper outer breast through the nipple area toward lower inner breast.  Direct fluid upward and inward from this line toward the pathway across your upper chest (established in #5).  Do this in three rows to treat all of the upper inner breast tissue, and do each row 3-4x. 8) Then repeat #5 above. 9) From the imaginary diagonal, direct fluid in three rows (to treat all of lower outer breast tissue) downward and outward toward pathway established in #6 that is aimed at the right groin. 10)  Then repeat #6 above. 11)  End with repeating #3 and #4 above.   [facility] [address] [place], [place]   [address] [phone]  

## 2020-08-20 ENCOUNTER — Other Ambulatory Visit: Payer: Self-pay

## 2020-08-20 ENCOUNTER — Ambulatory Visit: Payer: Medicare Other

## 2020-08-20 DIAGNOSIS — M6281 Muscle weakness (generalized): Secondary | ICD-10-CM

## 2020-08-20 DIAGNOSIS — M25611 Stiffness of right shoulder, not elsewhere classified: Secondary | ICD-10-CM | POA: Diagnosis not present

## 2020-08-20 DIAGNOSIS — I890 Lymphedema, not elsewhere classified: Secondary | ICD-10-CM

## 2020-08-20 DIAGNOSIS — M79621 Pain in right upper arm: Secondary | ICD-10-CM

## 2020-08-20 DIAGNOSIS — R293 Abnormal posture: Secondary | ICD-10-CM

## 2020-08-20 NOTE — Therapy (Signed)
[place], [name], [address] Phone: [phone]   Fax:  [phone]  Physical Therapy Treatment  Patient Details  Name: Kathy Reese MRN: [id number] Date of Birth: May 16, 1951 Referring Provider (PT): Dr. Sondra Come   Encounter Date: 08/20/2020   PT End of Session - 08/20/20 1004    Visit Number 5    Number of Visits 13    Date for PT Re-Evaluation 09/17/20    PT Start Time 0916   pt arrived late   PT Stop Time 1003    PT Time Calculation (min) 47 min    Activity Tolerance Patient tolerated treatment well    Behavior During Therapy [facility] for tasks assessed/performed           Past Medical History:  Diagnosis Date  . Allergic rhinitis, cause unspecified   . Arthritis   . Depression   . Diverticulosis   . History of Bell's palsy   . History of diabetes mellitus   . Hypersomnia   . OSA on CPAP    not using at this time 06/15/19  . Other and unspecified hyperlipidemia   . Personal history of chemotherapy   . Personal history of malignant neoplasm of ovary   . Personal history of radiation therapy   . Type II or unspecified type diabetes mellitus without mention of complication, not stated as uncontrolled   . Unspecified essential hypertension   . Vertigo     Past Surgical History:  Procedure Laterality Date  . ABDOMINAL ADHESION SURGERY  12/96   exc peritoneal cyst  . BREAST BIOPSY Left 2000   stereotactic, negative  . BREAST BIOPSY Right 05/08/2019  . BREAST LUMPECTOMY Right 05/30/2019  . BREAST LUMPECTOMY WITH RADIOACTIVE SEED AND SENTINEL LYMPH NODE BIOPSY Right 05/30/2019   Procedure: RIGHT BREAST LUMPECTOMY WITH BRACKETED RADIOACTIVE SEEDS  AND SENTINEL LYMPH NODE BIOPSY;  Surgeon: Stark Klein, MD;  Location: [place];  Service: General;  Laterality: Right;  . BREAST REDUCTION SURGERY Bilateral 10/03  . EYE SURGERY Left    cataracts  . LAPAROSCOPIC SALPINGO OOPHERECTOMY Right  12/96  . 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 LAPAROSCOPY ABDOMEN DIAGNOSTIC  2/08   w/LOA, LSO  . PORTACATH PLACEMENT Left 06/16/2019   Procedure: INSERTION PORT-A-CATH;  Surgeon: Stark Klein, MD;  Location: [place];  Service: General;  Laterality: Left;  . REDUCTION MAMMAPLASTY Bilateral 2004  . TONSILLECTOMY    . TOTAL ABDOMINAL HYSTERECTOMY W/ BILATERAL SALPINGOOPHORECTOMY  5/94   secondary to uterine fibroids  . TOTAL KNEE ARTHROPLASTY Right 05/12/2013   Procedure: TOTAL KNEE ARTHROPLASTY;  Surgeon: Alta Corning, MD;  Location: [place];  Service: Orthopedics;  Laterality: Right;  . TRIGGER FINGER RELEASE  07/06/2012   Procedure: RELEASE TRIGGER FINGER/A-1 PULLEY;  Surgeon: Alta Corning, MD;  Location: [place];  Service: Orthopedics;  Laterality: Left;  left ring finger  . TUBAL LIGATION      There were no vitals filed for this visit.   Subjective Assessment - 08/20/20 0918    Subjective Sorry I was running late, I had to wait for the repairman to come fix my heat. My Rt shoulder is improving.    Pertinent History R breast cancer, s/p R lumpectomy and SLNB on 05/30/19, 3 nodes removed all negative, h/x of chemo and radiation which was completed 10/27/19    Patient Stated Goals Be able to use right  UE to carry bags, reach , and do household chores    Currently in Pain? No/denies                             [facility] Adult PT Treatment/Exercise - 08/20/20 0001      Shoulder Exercises: Pulleys   Flexion 2 minutes    ABduction 2 minutes      Shoulder Exercises: Therapy Ball   Flexion Both;10 reps   forward lean into end of stretch   ABduction Right;10 reps   same side lean into end of stretch   ABduction Limitations Tactile cues to decrease scap compensation       Shoulder Exercises: Stretch   Other Shoulder Stretches Modified downward dog on wall 5x, 5 sec holds returning therapist demo       Manual Therapy   Manual Lymphatic Drainage (MLD) In Supine with HOB elevated: Short neck, 5 diaphragmatic breaths, left axillary LN, Right inguinal LN, Anterior interaxillary pathway, Right axillo-inguinal pathway and right breast, focusing mostly on superior aspect where fibrosis palpable, then all steps in supine.     Passive ROM In Supine: PROM right shoulder flexion, abduction and D2 to pts end ROM                       PT Long Term Goals - 08/06/20 1226      PT LONG TERM GOAL #1   Title Pt will demonstrate 140 degrees of R shoulder flexion to allow her to reach overhead.    Time 4    Period Weeks    Status New    Target Date 09/03/20      PT LONG TERM GOAL #2   Title Pt will demonstrates 160 degrees of R shoulder abduction to allow her to reach out to the side.    Time 6    Period Weeks    Status New    Target Date 09/17/20      PT LONG TERM GOAL #3   Title Pt will be able to lift R arm above head without being limited by tightness and discomfort in R axilla/chest    Time 4    Period Weeks    Status New      PT LONG TERM GOAL #4   Title Pt will be independent in a home exercise program for continued strengthening and stretching.    Time 4    Status New      PT LONG TERM GOAL #5   Title Pt will report she is confident she can continue to manage her symptoms on her own at home with exercise and use of compression    Time 6    Period Weeks    Status New      Additional Long Term Goals   Additional Long Term Goals Yes      PT LONG TERM GOAL #6   Title Pt. will have quick dash no greater than 15%    Baseline 55%    Time 6    Period Weeks    Status New                 Plan - 08/20/20 1004    Clinical Impression Statement Continued with focus on manual therapy to decrease tightness in Rt shoulder and for MLD to superior aspect of Rt breast. this does seem to be improving as area of fibrosis is smaller. Her P/ROM  seemed to improved some by end of  session. She has been wearing the chip pack daily and reports this beneficial as well.    Personal Factors and Comorbidities Comorbidity 1;Comorbidity 2    Comorbidities SLNB, diabetes    Examination-Activity Limitations Lift;Carry;Reach Overhead    Examination-Participation Restrictions Cleaning    Stability/Clinical Decision Making Stable/Uncomplicated    Rehab Potential Excellent    PT Frequency 2x / week    PT Duration 6 weeks    PT Treatment/Interventions ADLs/Self Care Home Management;Therapeutic exercise;Neuromuscular re-education;Manual techniques;Patient/family education;Manual lymph drainage;Passive range of motion    PT Next Visit Plan continue PROM right shoulder, Review with pt and cont MLD right breast, and progress ROM exercises    PT Home Exercise Plan continue post op exs;  AA flex and ER in supine 2-3 times per day, start to practice MLD    Consulted and Agree with Plan of Care Patient           Patient will benefit from skilled therapeutic intervention in order to improve the following deficits and impairments:  Decreased activity tolerance, Decreased mobility, Decreased strength, Increased edema, Pain, Impaired UE functional use, Decreased range of motion, Decreased knowledge of precautions  Visit Diagnosis: Stiffness of right shoulder, not elsewhere classified  Abnormal posture  Lymphedema, not elsewhere classified  Muscle weakness (generalized)  Pain in right upper arm     Problem List Patient Active Problem List   Diagnosis Date Noted  . Chemotherapy-induced peripheral neuropathy ([place]) 03/26/2020  . Port-A-Cath in place 07/11/2019  . Malignant neoplasm of upper-inner quadrant of right breast in female, estrogen receptor positive ([place]) 04/19/2019  . Facial weakness 11/07/2018  . 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 History of hysterectomy 10/12/2018  . History of bilateral salpingo-oophorectomy (BSO) 10/12/2018  .  Osteoarthritis of right knee 05/12/2013  . HYPERLIPIDEMIA 12/30/2007  . HYPERTENSION 12/30/2007  . INSOMNIA 12/30/2007  . HYPERSOMNIA 12/30/2007  . DM w/o Complication Type II 82/95/6213  . HYPERCHOLESTEROLEMIA 12/16/2007  . EXOGENOUS OBESITY 12/16/2007  . SLEEP APNEA, OBSTRUCTIVE 12/16/2007  . ALLERGIC RHINITIS 12/16/2007    Otelia Limes, PTA 08/20/2020, 10:07 AM  [place], [name], [address] Phone: [phone]   Fax:  [phone]  Name: Ivelisse Culverhouse MRN: [id number] Date of Birth: 1951-03-16

## 2020-08-22 ENCOUNTER — Other Ambulatory Visit: Payer: Self-pay

## 2020-08-22 ENCOUNTER — Ambulatory Visit: Payer: Medicare Other

## 2020-08-22 DIAGNOSIS — R293 Abnormal posture: Secondary | ICD-10-CM

## 2020-08-22 DIAGNOSIS — I890 Lymphedema, not elsewhere classified: Secondary | ICD-10-CM

## 2020-08-22 DIAGNOSIS — M25611 Stiffness of right shoulder, not elsewhere classified: Secondary | ICD-10-CM

## 2020-08-22 DIAGNOSIS — M6281 Muscle weakness (generalized): Secondary | ICD-10-CM

## 2020-08-22 DIAGNOSIS — M79621 Pain in right upper arm: Secondary | ICD-10-CM

## 2020-08-22 NOTE — Therapy (Signed)
[place], [name], [address] Phone: [phone]   Fax:  [phone]  Physical Therapy Treatment  Patient Details  Name: Kathy Reese MRN: [id number] Date of Birth: Jul 24, 1951 Referring Provider (PT): Dr. Sondra Come   Encounter Date: 08/22/2020   PT End of Session - 08/22/20 1005    Visit Number 6    Number of Visits 13    Date for PT Re-Evaluation 09/17/20    PT Start Time 0902    PT Stop Time 1003    PT Time Calculation (min) 61 min    Activity Tolerance Patient tolerated treatment well    Behavior During Therapy [facility] for tasks assessed/performed           Past Medical History:  Diagnosis Date  . Allergic rhinitis, cause unspecified   . Arthritis   . Depression   . Diverticulosis   . History of Bell's palsy   . History of diabetes mellitus   . Hypersomnia   . OSA on CPAP    not using at this time 06/15/19  . Other and unspecified hyperlipidemia   . Personal history of chemotherapy   . Personal history of malignant neoplasm of ovary   . Personal history of radiation therapy   . Type II or unspecified type diabetes mellitus without mention of complication, not stated as uncontrolled   . Unspecified essential hypertension   . Vertigo     Past Surgical History:  Procedure Laterality Date  . ABDOMINAL ADHESION SURGERY  12/96   exc peritoneal cyst  . BREAST BIOPSY Left 2000   stereotactic, negative  . BREAST BIOPSY Right 05/08/2019  . BREAST LUMPECTOMY Right 05/30/2019  . BREAST LUMPECTOMY WITH RADIOACTIVE SEED AND SENTINEL LYMPH NODE BIOPSY Right 05/30/2019   Procedure: RIGHT BREAST LUMPECTOMY WITH BRACKETED RADIOACTIVE SEEDS  AND SENTINEL LYMPH NODE BIOPSY;  Surgeon: Stark Klein, MD;  Location: [place];  Service: General;  Laterality: Right;  . BREAST REDUCTION SURGERY Bilateral 10/03  . EYE SURGERY Left    cataracts  . LAPAROSCOPIC SALPINGO OOPHERECTOMY Right 12/96  .  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 LAPAROSCOPY ABDOMEN DIAGNOSTIC  2/08   w/LOA, LSO  . PORTACATH PLACEMENT Left 06/16/2019   Procedure: INSERTION PORT-A-CATH;  Surgeon: Stark Klein, MD;  Location: [place];  Service: General;  Laterality: Left;  . REDUCTION MAMMAPLASTY Bilateral 2004  . TONSILLECTOMY    . TOTAL ABDOMINAL HYSTERECTOMY W/ BILATERAL SALPINGOOPHORECTOMY  5/94   secondary to uterine fibroids  . TOTAL KNEE ARTHROPLASTY Right 05/12/2013   Procedure: TOTAL KNEE ARTHROPLASTY;  Surgeon: Alta Corning, MD;  Location: [place];  Service: Orthopedics;  Laterality: Right;  . TRIGGER FINGER RELEASE  07/06/2012   Procedure: RELEASE TRIGGER FINGER/A-1 PULLEY;  Surgeon: Alta Corning, MD;  Location: [place];  Service: Orthopedics;  Laterality: Left;  left ring finger  . TUBAL LIGATION      There were no vitals filed for this visit.   Subjective Assessment - 08/22/20 0908    Subjective I can tell the chip pack is helping to make the hard spot in my breast a little smaller. But it still feels hard.    Pertinent History R breast cancer, s/p R lumpectomy and SLNB on 05/30/19, 3 nodes removed all negative, h/x of chemo and radiation which was completed 10/27/19    Patient Stated Goals Be able to use right UE to  carry bags, reach , and do household chores    Currently in Pain? No/denies                             [facility] Adult PT Treatment/Exercise - 08/22/20 0001      Shoulder Exercises: Pulleys   Flexion 2 minutes    ABduction 2 minutes    ABduction Limitations VCs throughout to decrease       Shoulder Exercises: Therapy Ball   Flexion Both;10 reps   forward lean into end of stretch     Shoulder Exercises: Stretch   Corner Stretch 3 reps;20 seconds   in Secretary/administrator Limitations returning therapist demo and cuing to hold for 20 sec    Other Shoulder Stretches Modified downward dog on wall  5x, 5 sec holds min VCs for technique      Manual Therapy   Manual Lymphatic Drainage (MLD) In Supine with HOB elevated: Short neck, 5 diaphragmatic breaths, left axillary LN, Right inguinal LN, Anterior interaxillary pathway, Right axillo-inguinal pathway and right breast, focusing mostly on superior aspect where fibrosis palpable, then all steps in supine.     Passive ROM In Supine: PROM right shoulder flexion, abduction and D2 to pts end ROM                       PT Long Term Goals - 08/06/20 1226      PT LONG TERM GOAL #1   Title Pt will demonstrate 140 degrees of R shoulder flexion to allow her to reach overhead.    Time 4    Period Weeks    Status New    Target Date 09/03/20      PT LONG TERM GOAL #2   Title Pt will demonstrates 160 degrees of R shoulder abduction to allow her to reach out to the side.    Time 6    Period Weeks    Status New    Target Date 09/17/20      PT LONG TERM GOAL #3   Title Pt will be able to lift R arm above head without being limited by tightness and discomfort in R axilla/chest    Time 4    Period Weeks    Status New      PT LONG TERM GOAL #4   Title Pt will be independent in a home exercise program for continued strengthening and stretching.    Time 4    Status New      PT LONG TERM GOAL #5   Title Pt will report she is confident she can continue to manage her symptoms on her own at home with exercise and use of compression    Time 6    Period Weeks    Status New      Additional Long Term Goals   Additional Long Term Goals Yes      PT LONG TERM GOAL #6   Title Pt. will have quick dash no greater than 15%    Baseline 55%    Time 6    Period Weeks    Status New                 Plan - 08/22/20 1005    Clinical Impression Statement Continued with AA/ROM exercises initally. Pt still requiring VCs to decrease Rt scapular compensation, but does show improvement with this as she is able  to perform this with better  control now. Then continued with manaul therapy focusing on working to decrease fibrosis at Rt superior aspect of breast with MLD. Though this area seems some smaller it is still very firm to touch. Pt is doing well with being compliant with wear of chip pack here daily which is also working to decreae fibrosis.    Personal Factors and Comorbidities Comorbidity 1;Comorbidity 2    Comorbidities SLNB, diabetes    Examination-Activity Limitations Lift;Carry;Reach Overhead    Examination-Participation Restrictions Cleaning    Stability/Clinical Decision Making Stable/Uncomplicated    Rehab Potential Excellent    PT Frequency 2x / week    PT Duration 6 weeks    PT Treatment/Interventions ADLs/Self Care Home Management;Therapeutic exercise;Neuromuscular re-education;Manual techniques;Patient/family education;Manual lymph drainage;Passive range of motion    PT Next Visit Plan continue PROM right shoulder, Review with pt and cont MLD right breast, and progress ROM exercises    PT Home Exercise Plan continue post op exs;  AA flex and ER in supine 2-3 times per day, start to practice MLD    Consulted and Agree with Plan of Care Patient           Patient will benefit from skilled therapeutic intervention in order to improve the following deficits and impairments:  Decreased activity tolerance, Decreased mobility, Decreased strength, Increased edema, Pain, Impaired UE functional use, Decreased range of motion, Decreased knowledge of precautions  Visit Diagnosis: Stiffness of right shoulder, not elsewhere classified  Abnormal posture  Lymphedema, not elsewhere classified  Muscle weakness (generalized)  Pain in right upper arm     Problem List Patient Active Problem List   Diagnosis Date Noted  . Chemotherapy-induced peripheral neuropathy ([place]) 03/26/2020  . Port-A-Cath in place 07/11/2019  . Malignant neoplasm of upper-inner quadrant of right breast in female, estrogen receptor positive  ([place]) 04/19/2019  . Facial weakness 11/07/2018  . 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 History of hysterectomy 10/12/2018  . History of bilateral salpingo-oophorectomy (BSO) 10/12/2018  . Osteoarthritis of right knee 05/12/2013  . HYPERLIPIDEMIA 12/30/2007  . HYPERTENSION 12/30/2007  . INSOMNIA 12/30/2007  . HYPERSOMNIA 12/30/2007  . DM w/o Complication Type II 85/92/9244  . HYPERCHOLESTEROLEMIA 12/16/2007  . EXOGENOUS OBESITY 12/16/2007  . SLEEP APNEA, OBSTRUCTIVE 12/16/2007  . ALLERGIC RHINITIS 12/16/2007    Otelia Limes, PTA 08/22/2020, 10:11 AM  [place], [name], [address] Phone: [phone]   Fax:  [phone]  Name: Maame Dack MRN: [id number] Date of Birth: [birth year]

## 2020-08-27 ENCOUNTER — Ambulatory Visit: Payer: Medicare Other

## 2020-08-27 ENCOUNTER — Encounter (INDEPENDENT_AMBULATORY_CARE_PROVIDER_SITE_OTHER): Payer: Self-pay

## 2020-08-27 ENCOUNTER — Other Ambulatory Visit: Payer: Self-pay

## 2020-08-27 DIAGNOSIS — M6281 Muscle weakness (generalized): Secondary | ICD-10-CM

## 2020-08-27 DIAGNOSIS — M25611 Stiffness of right shoulder, not elsewhere classified: Secondary | ICD-10-CM

## 2020-08-27 DIAGNOSIS — M79621 Pain in right upper arm: Secondary | ICD-10-CM

## 2020-08-27 DIAGNOSIS — I890 Lymphedema, not elsewhere classified: Secondary | ICD-10-CM

## 2020-08-27 DIAGNOSIS — R293 Abnormal posture: Secondary | ICD-10-CM

## 2020-08-27 NOTE — Therapy (Signed)
[place], [name], [address] Phone: [phone]   Fax:  [phone]  Physical Therapy Treatment  Patient Details  Name: Kathy Reese MRN: [id number] Date of Birth: 07/11/1951 Referring Provider (PT): Dr. Sondra Come   Encounter Date: 08/27/2020   PT End of Session - 08/27/20 0941    Visit Number 7    Number of Visits 13    Date for PT Re-Evaluation 09/17/20    PT Start Time 0905    PT Stop Time 0955    PT Time Calculation (min) 50 min    Activity Tolerance Patient tolerated treatment well    Behavior During Therapy [facility] for tasks assessed/performed           Past Medical History:  Diagnosis Date  . Allergic rhinitis, cause unspecified   . Arthritis   . Depression   . Diverticulosis   . History of Bell's palsy   . History of diabetes mellitus   . Hypersomnia   . OSA on CPAP    not using at this time 06/15/19  . Other and unspecified hyperlipidemia   . Personal history of chemotherapy   . Personal history of malignant neoplasm of ovary   . Personal history of radiation therapy   . Type II or unspecified type diabetes mellitus without mention of complication, not stated as uncontrolled   . Unspecified essential hypertension   . Vertigo     Past Surgical History:  Procedure Laterality Date  . ABDOMINAL ADHESION SURGERY  12/96   exc peritoneal cyst  . BREAST BIOPSY Left 2000   stereotactic, negative  . BREAST BIOPSY Right 05/08/2019  . BREAST LUMPECTOMY Right 05/30/2019  . BREAST LUMPECTOMY WITH RADIOACTIVE SEED AND SENTINEL LYMPH NODE BIOPSY Right 05/30/2019   Procedure: RIGHT BREAST LUMPECTOMY WITH BRACKETED RADIOACTIVE SEEDS  AND SENTINEL LYMPH NODE BIOPSY;  Surgeon: Stark Klein, MD;  Location: [place];  Service: General;  Laterality: Right;  . BREAST REDUCTION SURGERY Bilateral 10/03  . EYE SURGERY Left    cataracts  . LAPAROSCOPIC SALPINGO OOPHERECTOMY Right 12/96  .  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 LAPAROSCOPY ABDOMEN DIAGNOSTIC  2/08   w/LOA, LSO  . PORTACATH PLACEMENT Left 06/16/2019   Procedure: INSERTION PORT-A-CATH;  Surgeon: Stark Klein, MD;  Location: [place];  Service: General;  Laterality: Left;  . REDUCTION MAMMAPLASTY Bilateral 2004  . TONSILLECTOMY    . TOTAL ABDOMINAL HYSTERECTOMY W/ BILATERAL SALPINGOOPHORECTOMY  5/94   secondary to uterine fibroids  . TOTAL KNEE ARTHROPLASTY Right 05/12/2013   Procedure: TOTAL KNEE ARTHROPLASTY;  Surgeon: Alta Corning, MD;  Location: [place];  Service: Orthopedics;  Laterality: Right;  . TRIGGER FINGER RELEASE  07/06/2012   Procedure: RELEASE TRIGGER FINGER/A-1 PULLEY;  Surgeon: Alta Corning, MD;  Location: [place];  Service: Orthopedics;  Laterality: Left;  left ring finger  . TUBAL LIGATION      There were no vitals filed for this visit.   Subjective Assessment - 08/27/20 0907    Subjective The chip pack is helping and the area of firmness is much smaller.    Pertinent History R breast cancer, s/p R lumpectomy and SLNB on 05/30/19, 3 nodes removed all negative, h/x of chemo and radiation which was completed 10/27/19    Limitations House hold activities;Lifting    Patient Stated Goals Be able to use right UE to carry bags, reach ,  and do household chores    Currently in Pain? No/denies                             [facility] Adult PT Treatment/Exercise - 08/27/20 0001      Shoulder Exercises: Standing   Extension Strengthening;10 reps    Theraband Level (Shoulder Extension) Level 1 (Yellow)    Retraction Strengthening;Both;10 reps    Theraband Level (Shoulder Retraction) Level 1 (Yellow)      Shoulder Exercises: Pulleys   Flexion 2 minutes    ABduction 2 minutes    ABduction Limitations VCs throughout to decrease       Shoulder Exercises: Therapy Ball   Flexion Both;10 reps   forward lean into end of stretch      Shoulder Exercises: Stretch   Corner Stretch 3 reps;20 seconds   in corner     Manual Therapy   Manual Lymphatic Drainage (MLD) In Supine with HOB elevated: Short neck, 5 diaphragmatic breaths, left axillary LN, Right inguinal LN, Anterior interaxillary pathway, Right axillo-inguinal pathway and right breast, focusing mostly on superior aspect where fibrosis palpable, then all steps in supine.  Pt practiced technique on LN and pathways    Passive ROM In Supine: PROM right shoulder flexion, abduction and D2 to pts end ROM                  PT Education - 08/27/20 0940    Education Details Reviewed self MLD with pt practicing LN activation and pathways.  Improved technique today    Person(s) Educated Patient    Methods Explanation;Demonstration;Tactile cues;Verbal cues    Comprehension Verbalized understanding;Returned demonstration               PT Long Term Goals - 08/06/20 1226      PT LONG TERM GOAL #1   Title Pt will demonstrate 140 degrees of R shoulder flexion to allow her to reach overhead.    Time 4    Period Weeks    Status New    Target Date 09/03/20      PT LONG TERM GOAL #2   Title Pt will demonstrates 160 degrees of R shoulder abduction to allow her to reach out to the side.    Time 6    Period Weeks    Status New    Target Date 09/17/20      PT LONG TERM GOAL #3   Title Pt will be able to lift R arm above head without being limited by tightness and discomfort in R axilla/chest    Time 4    Period Weeks    Status New      PT LONG TERM GOAL #4   Title Pt will be independent in a home exercise program for continued strengthening and stretching.    Time 4    Status New      PT LONG TERM GOAL #5   Title Pt will report she is confident she can continue to manage her symptoms on her own at home with exercise and use of compression    Time 6    Period Weeks    Status New      Additional Long Term Goals   Additional Long Term Goals Yes       PT LONG TERM GOAL #6   Title Pt. will have quick dash no greater than 15%    Baseline 55%    Time 6  Period Weeks    Status New                 Plan - 08/27/20 0944    Clinical Impression Statement Pts. right breast fibrosis greatly improved with chip pack.  Reviewed self MLD with pt. demonstrating improved technique.  Pt still feels axillary tightness with pulleys and requires VCs to depress scapula    Personal Factors and Comorbidities Comorbidity 1;Comorbidity 2    Comorbidities SLNB, diabetes    Examination-Activity Limitations Lift;Carry;Reach Overhead    Examination-Participation Restrictions Cleaning    Stability/Clinical Decision Making Stable/Uncomplicated    Rehab Potential Excellent    PT Frequency 2x / week    PT Duration 6 weeks    PT Treatment/Interventions ADLs/Self Care Home Management;Therapeutic exercise;Neuromuscular re-education;Manual techniques;Patient/family education;Manual lymph drainage;Passive range of motion    PT Next Visit Plan continue PROM right shoulder, Review with pt and cont MLD right breast, and progress ROM exercises, add TBand exs to HEP           Patient will benefit from skilled therapeutic intervention in order to improve the following deficits and impairments:  Decreased activity tolerance, Decreased mobility, Decreased strength, Increased edema, Pain, Impaired UE functional use, Decreased range of motion, Decreased knowledge of precautions  Visit Diagnosis: Stiffness of right shoulder, not elsewhere classified  Abnormal posture  Lymphedema, not elsewhere classified  Muscle weakness (generalized)  Pain in right upper arm     Problem List Patient Active Problem List   Diagnosis Date Noted  . Chemotherapy-induced peripheral neuropathy ([place]) 03/26/2020  . Port-A-Cath in place 07/11/2019  . Malignant neoplasm of upper-inner quadrant of right breast in female, estrogen receptor positive ([place]) 04/19/2019  . Facial weakness  11/07/2018  . 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 History of hysterectomy 10/12/2018  . History of bilateral salpingo-oophorectomy (BSO) 10/12/2018  . Osteoarthritis of right knee 05/12/2013  . HYPERLIPIDEMIA 12/30/2007  . HYPERTENSION 12/30/2007  . INSOMNIA 12/30/2007  . HYPERSOMNIA 12/30/2007  . DM w/o Complication Type II 67/73/7366  . HYPERCHOLESTEROLEMIA 12/16/2007  . EXOGENOUS OBESITY 12/16/2007  . SLEEP APNEA, OBSTRUCTIVE 12/16/2007  . ALLERGIC RHINITIS 12/16/2007    Claris Pong 08/27/2020, 10:02 AM  [place], [name], [address] Phone: [phone]   Fax:  [phone]  Name: Kathy Reese MRN: [id number] Date of Birth: [birth year]  Cheral Almas, PT 08/27/20 10:03 AM

## 2020-09-02 ENCOUNTER — Other Ambulatory Visit: Payer: Self-pay

## 2020-09-02 ENCOUNTER — Inpatient Hospital Stay: Payer: Medicare Other | Attending: Hematology and Oncology

## 2020-09-02 DIAGNOSIS — C50211 Malignant neoplasm of upper-inner quadrant of right female breast: Secondary | ICD-10-CM | POA: Insufficient documentation

## 2020-09-02 DIAGNOSIS — Z452 Encounter for adjustment and management of vascular access device: Secondary | ICD-10-CM | POA: Insufficient documentation

## 2020-09-02 DIAGNOSIS — Z95828 Presence of other vascular implants and grafts: Secondary | ICD-10-CM

## 2020-09-02 DIAGNOSIS — Z170 Estrogen receptor positive status [ER+]: Secondary | ICD-10-CM

## 2020-09-02 MED ORDER — SODIUM CHLORIDE 0.9% FLUSH
10.0000 mL | Freq: Once | INTRAVENOUS | Status: AC
  Administered 2020-09-02: 10 mL
  Filled 2020-09-02: qty 10

## 2020-09-02 MED ORDER — HEPARIN SOD (PORK) LOCK FLUSH 100 UNIT/ML IV SOLN
500.0000 [IU] | Freq: Once | INTRAVENOUS | Status: AC
  Administered 2020-09-02: 500 [IU]
  Filled 2020-09-02: qty 5

## 2020-09-02 NOTE — Patient Instructions (Signed)

## 2020-09-03 ENCOUNTER — Encounter (INDEPENDENT_AMBULATORY_CARE_PROVIDER_SITE_OTHER): Payer: Self-pay

## 2020-09-03 ENCOUNTER — Ambulatory Visit: Payer: Medicare Other

## 2020-09-03 DIAGNOSIS — R293 Abnormal posture: Secondary | ICD-10-CM

## 2020-09-03 DIAGNOSIS — M25611 Stiffness of right shoulder, not elsewhere classified: Secondary | ICD-10-CM

## 2020-09-03 DIAGNOSIS — I890 Lymphedema, not elsewhere classified: Secondary | ICD-10-CM

## 2020-09-03 DIAGNOSIS — M79621 Pain in right upper arm: Secondary | ICD-10-CM

## 2020-09-03 DIAGNOSIS — M6281 Muscle weakness (generalized): Secondary | ICD-10-CM

## 2020-09-03 NOTE — Therapy (Signed)
[place], [name], [address] Phone: [phone]   Fax:  [phone]  Physical Therapy Treatment  Patient Details  Name: Kathy Reese MRN: [id number] Date of Birth: 08/18/1951 Referring Provider (PT): Dr. Sondra Come   Encounter Date: 09/03/2020   PT End of Session - 09/03/20 1014    Visit Number 8    Number of Visits 13    Date for PT Re-Evaluation 09/17/20    PT Start Time 0911    PT Stop Time 1010    PT Time Calculation (min) 59 min    Activity Tolerance Patient tolerated treatment well    Behavior During Therapy [facility] for tasks assessed/performed           Past Medical History:  Diagnosis Date  . Allergic rhinitis, cause unspecified   . Arthritis   . Depression   . Diverticulosis   . History of Bell's palsy   . History of diabetes mellitus   . Hypersomnia   . OSA on CPAP    not using at this time 06/15/19  . Other and unspecified hyperlipidemia   . Personal history of chemotherapy   . Personal history of malignant neoplasm of ovary   . Personal history of radiation therapy   . Type II or unspecified type diabetes mellitus without mention of complication, not stated as uncontrolled   . Unspecified essential hypertension   . Vertigo     Past Surgical History:  Procedure Laterality Date  . ABDOMINAL ADHESION SURGERY  12/96   exc peritoneal cyst  . BREAST BIOPSY Left 2000   stereotactic, negative  . BREAST BIOPSY Right 05/08/2019  . BREAST LUMPECTOMY Right 05/30/2019  . BREAST LUMPECTOMY WITH RADIOACTIVE SEED AND SENTINEL LYMPH NODE BIOPSY Right 05/30/2019   Procedure: RIGHT BREAST LUMPECTOMY WITH BRACKETED RADIOACTIVE SEEDS  AND SENTINEL LYMPH NODE BIOPSY;  Surgeon: Stark Klein, MD;  Location: [place];  Service: General;  Laterality: Right;  . BREAST REDUCTION SURGERY Bilateral 10/03  . EYE SURGERY Left    cataracts  . LAPAROSCOPIC SALPINGO OOPHERECTOMY Right 12/96  .  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 LAPAROSCOPY ABDOMEN DIAGNOSTIC  2/08   w/LOA, LSO  . PORTACATH PLACEMENT Left 06/16/2019   Procedure: INSERTION PORT-A-CATH;  Surgeon: Stark Klein, MD;  Location: [place];  Service: General;  Laterality: Left;  . REDUCTION MAMMAPLASTY Bilateral 2004  . TONSILLECTOMY    . TOTAL ABDOMINAL HYSTERECTOMY W/ BILATERAL SALPINGOOPHORECTOMY  5/94   secondary to uterine fibroids  . TOTAL KNEE ARTHROPLASTY Right 05/12/2013   Procedure: TOTAL KNEE ARTHROPLASTY;  Surgeon: Alta Corning, MD;  Location: [place];  Service: Orthopedics;  Laterality: Right;  . TRIGGER FINGER RELEASE  07/06/2012   Procedure: RELEASE TRIGGER FINGER/A-1 PULLEY;  Surgeon: Alta Corning, MD;  Location: [place];  Service: Orthopedics;  Laterality: Left;  left ring finger  . TUBAL LIGATION      There were no vitals filed for this visit.   Subjective Assessment - 09/03/20 0921    Subjective I feel Like I'm getting better. I want to come 1x this week and once next week and be ready to D/C I think.    Pertinent History R breast cancer, s/p R lumpectomy and SLNB on 05/30/19, 3 nodes removed all negative, h/x of chemo and radiation which was completed 10/27/19    Patient Stated Goals Be able to use right UE to  carry bags, reach , and do household chores    Currently in Pain? No/denies                             [facility] Adult PT Treatment/Exercise - 09/03/20 0001      Manual Therapy   Manual Lymphatic Drainage (MLD) In Supine with HOB elevated: Short neck, 5 diaphragmatic breaths, left axillary LN, Right inguinal LN, Anterior interaxillary pathway, Right axillo-inguinal pathway and right breast, focusing mostly on superior aspect where fibrosis palpable, then all steps in supine.  Pt practiced technique on LN and pathways    Passive ROM In Supine: PROM right shoulder flexion, abduction and D2 to pts end ROM                        PT Long Term Goals - 08/06/20 1226      PT LONG TERM GOAL #1   Title Pt will demonstrate 140 degrees of R shoulder flexion to allow her to reach overhead.    Time 4    Period Weeks    Status New    Target Date 09/03/20      PT LONG TERM GOAL #2   Title Pt will demonstrates 160 degrees of R shoulder abduction to allow her to reach out to the side.    Time 6    Period Weeks    Status New    Target Date 09/17/20      PT LONG TERM GOAL #3   Title Pt will be able to lift R arm above head without being limited by tightness and discomfort in R axilla/chest    Time 4    Period Weeks    Status New      PT LONG TERM GOAL #4   Title Pt will be independent in a home exercise program for continued strengthening and stretching.    Time 4    Status New      PT LONG TERM GOAL #5   Title Pt will report she is confident she can continue to manage her symptoms on her own at home with exercise and use of compression    Time 6    Period Weeks    Status New      Additional Long Term Goals   Additional Long Term Goals Yes      PT LONG TERM GOAL #6   Title Pt. will have quick dash no greater than 15%    Baseline 55%    Time 6    Period Weeks    Status New                 Plan - 09/03/20 1014    Clinical Impression Statement Overall pt reports feeling much bette as her A/ROM is improvedwith ADLs and she can tell great improvement with the breast lymphedema from doing self MLD and wearing chip pack daily. She would  lik to come to [facility] session next week and consider D/C then. Continued with focus on decreasing scar tissue and fibrosis in superior aspect of Rt breast, along with increasing end P/ROM.    Personal Factors and Comorbidities Comorbidity 1;Comorbidity 2    Comorbidities SLNB, diabetes    Examination-Activity Limitations Lift;Carry;Reach Overhead    Examination-Participation Restrictions Cleaning    Stability/Clinical Decision Making  Stable/Uncomplicated    Rehab Potential Excellent    PT Frequency 2x / week    PT  Duration 6 weeks    PT Treatment/Interventions ADLs/Self Care Home Management;Therapeutic exercise;Neuromuscular re-education;Manual techniques;Patient/family education;Manual lymph drainage;Passive range of motion    PT Next Visit Plan Consider D/C next session, reassess goals. Review supine scapular series with pt (was issued these at last episode of care) to make sure she is still doing these well and review technique for self MLD prn    PT Home Exercise Plan continue post op exs;  AA flex and ER in supine 2-3 times per day, start to practice MLD    Consulted and Agree with Plan of Care Patient           Patient will benefit from skilled therapeutic intervention in order to improve the following deficits and impairments:  Decreased activity tolerance, Decreased mobility, Decreased strength, Increased edema, Pain, Impaired UE functional use, Decreased range of motion, Decreased knowledge of precautions  Visit Diagnosis: Stiffness of right shoulder, not elsewhere classified  Abnormal posture  Lymphedema, not elsewhere classified  Muscle weakness (generalized)  Pain in right upper arm     Problem List Patient Active Problem List   Diagnosis Date Noted  . Chemotherapy-induced peripheral neuropathy ([place]) 03/26/2020  . Port-A-Cath in place 07/11/2019  . Malignant neoplasm of upper-inner quadrant of right breast in female, estrogen receptor positive ([place]) 04/19/2019  . Facial weakness 11/07/2018  . 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 History of hysterectomy 10/12/2018  . History of bilateral salpingo-oophorectomy (BSO) 10/12/2018  . Osteoarthritis of right knee 05/12/2013  . HYPERLIPIDEMIA 12/30/2007  . HYPERTENSION 12/30/2007  . INSOMNIA 12/30/2007  . HYPERSOMNIA 12/30/2007  . DM w/o Complication Type II 15/72/6203  . HYPERCHOLESTEROLEMIA 12/16/2007  .  EXOGENOUS OBESITY 12/16/2007  . SLEEP APNEA, OBSTRUCTIVE 12/16/2007  . ALLERGIC RHINITIS 12/16/2007    Otelia Limes, PTA 09/03/2020, 10:17 AM  [place], [name], [address] Phone: [phone]   Fax:  [phone]  Name: Kathy Reese MRN: [id number] Date of Birth: 06-14-1951

## 2020-09-10 ENCOUNTER — Encounter (INDEPENDENT_AMBULATORY_CARE_PROVIDER_SITE_OTHER): Payer: Self-pay

## 2020-09-10 ENCOUNTER — Other Ambulatory Visit: Payer: Self-pay

## 2020-09-10 ENCOUNTER — Ambulatory Visit: Payer: Medicare Other | Attending: Radiation Oncology

## 2020-09-10 DIAGNOSIS — M6281 Muscle weakness (generalized): Secondary | ICD-10-CM | POA: Diagnosis present

## 2020-09-10 DIAGNOSIS — M25611 Stiffness of right shoulder, not elsewhere classified: Secondary | ICD-10-CM | POA: Diagnosis not present

## 2020-09-10 DIAGNOSIS — R293 Abnormal posture: Secondary | ICD-10-CM | POA: Diagnosis present

## 2020-09-10 DIAGNOSIS — M79621 Pain in right upper arm: Secondary | ICD-10-CM | POA: Insufficient documentation

## 2020-09-10 DIAGNOSIS — I890 Lymphedema, not elsewhere classified: Secondary | ICD-10-CM | POA: Diagnosis present

## 2020-09-10 DIAGNOSIS — R262 Difficulty in walking, not elsewhere classified: Secondary | ICD-10-CM | POA: Insufficient documentation

## 2020-09-10 NOTE — Therapy (Signed)
[place], [name], [address] Phone: [phone]   Fax:  [phone]  Physical Therapy Treatment  Patient Details  Name: Kathy Reese MRN: [id number] Date of Birth: 1951/07/05 Referring Provider (PT): Dr. Sondra Come   Encounter Date: 09/10/2020   PT End of Session - 09/10/20 1213    Visit Number 9    Number of Visits 13    Date for PT Re-Evaluation 09/17/20    PT Start Time 0909    PT Stop Time 0956    PT Time Calculation (min) 47 min    Activity Tolerance Patient tolerated treatment well    Behavior During Therapy [facility] for tasks assessed/performed           Past Medical History:  Diagnosis Date  . Allergic rhinitis, cause unspecified   . Arthritis   . Depression   . Diverticulosis   . History of Bell's palsy   . History of diabetes mellitus   . Hypersomnia   . OSA on CPAP    not using at this time 06/15/19  . Other and unspecified hyperlipidemia   . Personal history of chemotherapy   . Personal history of malignant neoplasm of ovary   . Personal history of radiation therapy   . Type II or unspecified type diabetes mellitus without mention of complication, not stated as uncontrolled   . Unspecified essential hypertension   . Vertigo     Past Surgical History:  Procedure Laterality Date  . ABDOMINAL ADHESION SURGERY  12/96   exc peritoneal cyst  . BREAST BIOPSY Left 2000   stereotactic, negative  . BREAST BIOPSY Right 05/08/2019  . BREAST LUMPECTOMY Right 05/30/2019  . BREAST LUMPECTOMY WITH RADIOACTIVE SEED AND SENTINEL LYMPH NODE BIOPSY Right 05/30/2019   Procedure: RIGHT BREAST LUMPECTOMY WITH BRACKETED RADIOACTIVE SEEDS  AND SENTINEL LYMPH NODE BIOPSY;  Surgeon: Stark Klein, MD;  Location: [place];  Service: General;  Laterality: Right;  . BREAST REDUCTION SURGERY Bilateral 10/03  . EYE SURGERY Left    cataracts  . LAPAROSCOPIC SALPINGO OOPHERECTOMY Right 12/96  .  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 LAPAROSCOPY ABDOMEN DIAGNOSTIC  2/08   w/LOA, LSO  . PORTACATH PLACEMENT Left 06/16/2019   Procedure: INSERTION PORT-A-CATH;  Surgeon: Stark Klein, MD;  Location: [place];  Service: General;  Laterality: Left;  . REDUCTION MAMMAPLASTY Bilateral 2004  . TONSILLECTOMY    . TOTAL ABDOMINAL HYSTERECTOMY W/ BILATERAL SALPINGOOPHORECTOMY  5/94   secondary to uterine fibroids  . TOTAL KNEE ARTHROPLASTY Right 05/12/2013   Procedure: TOTAL KNEE ARTHROPLASTY;  Surgeon: Alta Corning, MD;  Location: [place];  Service: Orthopedics;  Laterality: Right;  . TRIGGER FINGER RELEASE  07/06/2012   Procedure: RELEASE TRIGGER FINGER/A-1 PULLEY;  Surgeon: Alta Corning, MD;  Location: [place];  Service: Orthopedics;  Laterality: Left;  left ring finger  . TUBAL LIGATION      There were no vitals filed for this visit.   Subjective Assessment - 09/10/20 0909    Subjective Everything is going well.  Can reach better now.  Swelling seems better but it is still a little firm.  Have been doing the MLD.   Doing all of my normal activities without any problems.    Pertinent History R breast cancer, s/p R lumpectomy and SLNB on 05/30/19, 3 nodes removed all negative, h/x of chemo and radiation which was completed 10/27/19  Currently in Pain? No/denies              [facility] PT Assessment - 09/10/20 0001      AROM   Right Shoulder Extension 59 Degrees    Right Shoulder Flexion 137 Degrees    Right Shoulder ABduction 162 Degrees    Right Shoulder Internal Rotation 54 Degrees    Right Shoulder External Rotation 88 Degrees             LYMPHEDEMA/ONCOLOGY QUESTIONNAIRE - 09/10/20 0001      Right Upper Extremity Lymphedema   10 cm Proximal to Olecranon Process 39.9 cm    Olecranon Process 28.4 cm    15 cm Proximal to Ulnar Styloid Process 26 cm    Just Proximal to Ulnar Styloid Process 17.7 cm    Across  Hand at [employer] 19.5 cm    At [place] of 2nd Digit 6.7 cm      Left Upper Extremity Lymphedema   10 cm Proximal to Olecranon Process 37.8 cm    Olecranon Process 29.7 cm    10 cm Proximal to Ulnar Styloid Process 26 cm    Just Proximal to Ulnar Styloid Process 17.6 cm    Across Hand at [employer] 18.7 cm    At [place] of 2nd Digit 6.1 cm              Quick Dash - 09/10/20 0001    Open a tight or new jar Moderate difficulty    Do heavy household chores (wash walls, wash floors) No difficulty    Carry a shopping bag or briefcase No difficulty    Wash your back Mild difficulty    Use a knife to cut food Mild difficulty    Recreational activities in which you take some force or impact through your arm, shoulder, or hand (golf, hammering, tennis) No difficulty    During the past week, to what extent has your arm, shoulder or hand problem interfered with your normal social activities with family, friends, neighbors, or groups? Not at all    During the past week, to what extent has your arm, shoulder or hand problem limited your work or other regular daily activities Not at all    Arm, shoulder, or hand pain. None    Tingling (pins and needles) in your arm, shoulder, or hand None    Difficulty Sleeping No difficulty    DASH Score 9.09 %                  [facility] Adult PT Treatment/Exercise - 09/10/20 0001      Shoulder Exercises: Pulleys   Flexion 2 minutes    ABduction 2 minutes      Manual Therapy   Edema Management New chip pack made for bra    Manual Lymphatic Drainage (MLD) Reviewed self MLD with pt in sitting to right breast area.  Pt was able to return demonstrate with occasional VC's                  PT Education - 09/10/20 1212    Education Details Reviewed self MLD with pt practicing.  She required occasional VC's for technique and direction    Person(s) Educated Patient    Methods Explanation;Tactile cues    Comprehension Verbalized  understanding;Verbal cues required;Tactile cues required;Other (comment)   Pt feels she can do fine when using her written instructions  PT Long Term Goals - 09/10/20 0936      PT LONG TERM GOAL #1   Title Pt will demonstrate 140 degrees of R shoulder flexion to allow her to reach overhead.    Baseline 137    Time 4    Period Weeks    Status Partially Met      PT LONG TERM GOAL #2   Title Pt will demonstrates 160 degrees of R shoulder abduction to allow her to reach out to the side.    Baseline 162    Time 6    Period Weeks    Status Achieved      PT LONG TERM GOAL #3   Title Pt will be able to lift R arm above head without being limited by tightness and discomfort in R axilla/chest    Baseline YES    Time 4    Period Weeks    Status Achieved      PT LONG TERM GOAL #4   Title Pt will be independent in a home exercise program for continued strengthening and stretching.    Time 4    Period Weeks    Status Achieved      PT LONG TERM GOAL #5   Title Pt will report she is confident she can continue to manage her symptoms on her own at home with exercise and use of compression    Time 6    Period Weeks    Status Achieved      PT LONG TERM GOAL #6   Title Pt. will have quick dash no greater than 15%    Baseline 9%    Time 6    Period Weeks    Status Achieved                 Plan - 09/10/20 1214    Clinical Impression Statement Pt has achieved all goals established except for lacking 3 degrees from reaching shoulder flexion goal.  She is independent and compliant with her HEP and wearing chip pack.  There is much less fibrosis noted at right breast and ROM has improved significantly.  She feels ready to be released to a HEP.    Personal Factors and Comorbidities Comorbidity 1;Comorbidity 2    Comorbidities SLNB, diabetes    Examination-Participation Restrictions Cleaning    Stability/Clinical Decision Making Stable/Uncomplicated    Clinical  Decision Making Low    Rehab Potential Excellent    PT Frequency 2x / week    PT Duration 6 weeks    PT Treatment/Interventions ADLs/Self Care Home Management;Therapeutic exercise;Neuromuscular re-education;Manual techniques;Patient/family education;Manual lymph drainage;Passive range of motion    PT Next Visit Plan DC to HEP today.  Pt advised to call with any questions or concernsContinue HEP, MLD    Consulted and Agree with Plan of Care Patient           Patient will benefit from skilled therapeutic intervention in order to improve the following deficits and impairments:  Decreased activity tolerance, Decreased mobility, Decreased strength, Increased edema, Pain, Impaired UE functional use, Decreased range of motion, Decreased knowledge of precautions  Visit Diagnosis: Stiffness of right shoulder, not elsewhere classified  Abnormal posture  Lymphedema, not elsewhere classified  Muscle weakness (generalized)  Pain in right upper arm  Difficulty in walking, not elsewhere classified     Problem List Patient Active Problem List   Diagnosis Date Noted  . Chemotherapy-induced peripheral neuropathy ([place]) 03/26/2020  . Port-A-Cath in place 07/11/2019  .  Malignant neoplasm of upper-inner quadrant of right breast in female, estrogen receptor positive ([place]) 04/19/2019  . Facial weakness 11/07/2018  . 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 History of hysterectomy 10/12/2018  . History of bilateral salpingo-oophorectomy (BSO) 10/12/2018  . Osteoarthritis of right knee 05/12/2013  . HYPERLIPIDEMIA 12/30/2007  . HYPERTENSION 12/30/2007  . INSOMNIA 12/30/2007  . HYPERSOMNIA 12/30/2007  . DM w/o Complication Type II 41/79/1995  . HYPERCHOLESTEROLEMIA 12/16/2007  . EXOGENOUS OBESITY 12/16/2007  . SLEEP APNEA, OBSTRUCTIVE 12/16/2007  . ALLERGIC RHINITIS 12/16/2007   PHYSICAL THERAPY DISCHARGE SUMMARY  Visits from Start of Care: 9  Current  functional level related to goals / functional outcomes: independent   Remaining deficits:mild breast swelling controlled with chip pack   Education / Equipment: Chip pack Plan: Patient agrees to discharge.  Patient goals were met. Patient is being discharged due to meeting the stated rehab goals.  ?????      Claris Pong 09/10/2020, 12:22 PM  [place], [name], [address] Phone: [phone]   Fax:  [phone] Cheral Almas, PT 09/10/20 12:25 PM  Name: Edda Orea MRN: [id number] Date of Birth: Nov 13, 1950

## 2020-09-24 ENCOUNTER — Encounter (INDEPENDENT_AMBULATORY_CARE_PROVIDER_SITE_OTHER): Payer: Self-pay

## 2020-10-01 ENCOUNTER — Encounter (INDEPENDENT_AMBULATORY_CARE_PROVIDER_SITE_OTHER): Payer: Self-pay

## 2020-10-05 DIAGNOSIS — U071 COVID-19: Secondary | ICD-10-CM

## 2020-10-05 HISTORY — DX: COVID-19: U07.1

## 2020-10-08 ENCOUNTER — Encounter (INDEPENDENT_AMBULATORY_CARE_PROVIDER_SITE_OTHER): Payer: Self-pay

## 2020-10-09 ENCOUNTER — Encounter (HOSPITAL_BASED_OUTPATIENT_CLINIC_OR_DEPARTMENT_OTHER): Payer: Self-pay | Admitting: General Surgery

## 2020-10-09 ENCOUNTER — Other Ambulatory Visit: Payer: Self-pay

## 2020-10-12 ENCOUNTER — Other Ambulatory Visit (HOSPITAL_COMMUNITY): Payer: Medicare Other

## 2020-10-14 ENCOUNTER — Encounter (HOSPITAL_BASED_OUTPATIENT_CLINIC_OR_DEPARTMENT_OTHER)
Admission: RE | Admit: 2020-10-14 | Discharge: 2020-10-14 | Disposition: A | Discharge status: Home / Self Care | Payer: Medicare Other | Source: Ambulatory Visit | Attending: General Surgery | Admitting: General Surgery

## 2020-10-14 ENCOUNTER — Other Ambulatory Visit (HOSPITAL_COMMUNITY)
Admission: RE | Admit: 2020-10-14 | Discharge: 2020-10-14 | Disposition: A | Discharge status: Home / Self Care | Payer: Medicare Other | Source: Ambulatory Visit | Attending: General Surgery | Admitting: General Surgery

## 2020-10-14 DIAGNOSIS — Z01812 Encounter for preprocedural laboratory examination: Secondary | ICD-10-CM | POA: Insufficient documentation

## 2020-10-14 DIAGNOSIS — I10 Essential (primary) hypertension: Secondary | ICD-10-CM | POA: Insufficient documentation

## 2020-10-14 DIAGNOSIS — Z01818 Encounter for other preprocedural examination: Secondary | ICD-10-CM | POA: Insufficient documentation

## 2020-10-14 DIAGNOSIS — U071 COVID-19: Secondary | ICD-10-CM | POA: Diagnosis not present

## 2020-10-14 LAB — BASIC METABOLIC PANEL
Anion gap: 8 (ref 5–15)
BUN: 19 mg/dL (ref 8–23)
CO2: 27 mmol/L (ref 22–32)
Calcium: 9.2 mg/dL (ref 8.9–10.3)
Chloride: 100 mmol/L (ref 98–111)
Creatinine, Ser: 0.78 mg/dL (ref 0.44–1.00)
GFR, Estimated: >60 mL/min (ref >60)
Glucose, Bld: 149 mg/dL — ABNORMAL HIGH (ref 70–99)
Potassium: 4.7 mmol/L (ref 3.5–5.1)
Sodium: 135 mmol/L (ref 135–145)

## 2020-10-14 LAB — SARS CORONAVIRUS 2 (TAT 6-24 HRS): SARS Coronavirus 2: POSITIVE — AB

## 2020-10-15 ENCOUNTER — Encounter: Payer: Self-pay | Admitting: Nurse Practitioner

## 2020-10-15 ENCOUNTER — Telehealth: Payer: Self-pay | Admitting: Nurse Practitioner

## 2020-10-15 ENCOUNTER — Encounter (INDEPENDENT_AMBULATORY_CARE_PROVIDER_SITE_OTHER): Payer: Self-pay

## 2020-10-15 NOTE — Telephone Encounter (Signed)
I called Kathy Reese to discuss Covid symptoms and the use of Sotrovimab, a monoclonal antibody infusion for those with mild to moderate Covid symptoms and at a high risk of hospitalization.    Pt does not qualify for infusion therapy as pt has asymptomatic infection. Isolation precautions discussed. Advised to contact back for consideration should they develop symptoms. Patient verbalized understanding.      Patient Active Problem List   Diagnosis Date Noted  . COVID-19 virus infection 10/2020  . Chemotherapy-induced peripheral neuropathy ([place]) 03/26/2020  . Port-A-Cath in place 07/11/2019  . Malignant neoplasm of upper-inner quadrant of right breast in female, estrogen receptor positive ([place]) 04/19/2019  . Facial weakness 11/07/2018  . 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 History of hysterectomy 10/12/2018  . History of bilateral salpingo-oophorectomy (BSO) 10/12/2018  . Osteoarthritis of right knee 05/12/2013  . HYPERLIPIDEMIA 12/30/2007  . HYPERTENSION 12/30/2007  . INSOMNIA 12/30/2007  . HYPERSOMNIA 12/30/2007  . DM w/o Complication Type II 51/70/0174  . HYPERCHOLESTEROLEMIA 12/16/2007  . EXOGENOUS OBESITY 12/16/2007  . SLEEP APNEA, OBSTRUCTIVE 12/16/2007  . ALLERGIC RHINITIS 12/16/2007    Murray Hodgkins, NP

## 2020-10-15 NOTE — Progress Notes (Signed)
Heather at Dr. Marlowe Aschoff office made aware of patient's covid test result.

## 2020-10-16 ENCOUNTER — Ambulatory Visit (HOSPITAL_BASED_OUTPATIENT_CLINIC_OR_DEPARTMENT_OTHER): Admission: RE | Admit: 2020-10-16 | Payer: Medicare Other | Source: Home / Self Care | Admitting: General Surgery

## 2020-10-16 SURGERY — REMOVAL PORT-A-CATH
Anesthesia: Monitor Anesthesia Care

## 2020-10-16 NOTE — Telephone Encounter (Signed)
Spoke to pt. Notified current protocol is that she would not need another COVID test until 90 days after positive results. Also made aware that per CDC guidelines, quarantine not required AFTER 5 days without symptoms.  Pt has not presented with symptoms since positive test, so verbalized understanding that she will not require COVID test at this time.  Pt has no further questions at this time.

## 2020-10-16 NOTE — Telephone Encounter (Signed)
Patient calling Has additional questions on COVID like if and when she should test again Please call to discuss

## 2020-11-01 ENCOUNTER — Telehealth: Payer: Self-pay | Admitting: Hematology and Oncology

## 2020-11-01 NOTE — Telephone Encounter (Signed)
Called pt per 1/28 sch msg - no answer . Left message with new appt date and time

## 2020-11-04 ENCOUNTER — Telehealth: Payer: Self-pay

## 2020-11-04 NOTE — Telephone Encounter (Signed)
RN spoke with patient regarding flush being schedule for 2/28 - Pt wanted to ensure this was okay, RN confirmed with patient that this was OK to have on this date.  No further needs at this time.

## 2020-11-05 ENCOUNTER — Encounter (INDEPENDENT_AMBULATORY_CARE_PROVIDER_SITE_OTHER): Payer: Self-pay

## 2020-11-12 ENCOUNTER — Encounter (INDEPENDENT_AMBULATORY_CARE_PROVIDER_SITE_OTHER): Payer: Self-pay

## 2020-11-19 ENCOUNTER — Encounter (INDEPENDENT_AMBULATORY_CARE_PROVIDER_SITE_OTHER): Payer: Self-pay

## 2020-11-21 ENCOUNTER — Other Ambulatory Visit: Payer: Self-pay

## 2020-11-21 ENCOUNTER — Ambulatory Visit (INDEPENDENT_AMBULATORY_CARE_PROVIDER_SITE_OTHER): Payer: Medicare Other | Admitting: Obstetrics and Gynecology

## 2020-11-21 ENCOUNTER — Encounter: Payer: Self-pay | Admitting: Obstetrics and Gynecology

## 2020-11-21 VITALS — BP 132/70 | HR 57 | Ht 63.0 in | Wt 119.0 lb

## 2020-11-21 DIAGNOSIS — N393 Stress incontinence (female) (male): Secondary | ICD-10-CM | POA: Diagnosis not present

## 2020-11-21 DIAGNOSIS — C50919 Malignant neoplasm of unspecified site of unspecified female breast: Secondary | ICD-10-CM | POA: Insufficient documentation

## 2020-11-21 DIAGNOSIS — Z9189 Other specified personal risk factors, not elsewhere classified: Secondary | ICD-10-CM

## 2020-11-21 DIAGNOSIS — E1165 Type 2 diabetes mellitus with hyperglycemia: Secondary | ICD-10-CM | POA: Insufficient documentation

## 2020-11-21 DIAGNOSIS — Z853 Personal history of malignant neoplasm of breast: Secondary | ICD-10-CM

## 2020-11-21 DIAGNOSIS — E559 Vitamin D deficiency, unspecified: Secondary | ICD-10-CM | POA: Insufficient documentation

## 2020-11-21 DIAGNOSIS — R809 Proteinuria, unspecified: Secondary | ICD-10-CM | POA: Insufficient documentation

## 2020-11-21 NOTE — Progress Notes (Signed)
70 y.o. [id number] Married Black or [place] American Not Hispanic or Latino female here for breast and pelvic exam. No vaginal bleeding. Not sexually active.    She has a h/o a hysterectomy/BSO. 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 She was getting yearly CA 125, per Dr Denman George she no longer needs these.     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 S/p lumpectomy, chemo and radiation, on Arimidex.  Mild GSI, leaks a small amount.  No bowel c/o.  Patient's last menstrual period was 02/02/1993 (exact date).          Sexually active: No.  The current method of family planning is post menopausal status.    Exercising: No.  The patient does not participate in regular exercise at present. Smoker:  No   Health Maintenance: Pap:  08/29/13 normal  History of abnormal Pap:  no MMG:  03/29/20 Bi-rads 2 benign  BMD:   03/29/20 Normal.  Colonoscopy:03-22-13 WNL repeat in 10 yrs  TDaP:  Unsure  Gardasil: NA    reports that she has never smoked. She has never used smokeless tobacco. She reports current alcohol use of about 1.0 standard drink of alcohol per week. She reports that she does not use drugs. She is retired. Husband has dementia, goes to the day program at [facility].  She has 3 kids and 4 grand kids. Has 6 month twins grand children that she watches several days a week.   Past Medical History:  Diagnosis Date  . Allergic rhinitis, cause unspecified   . Arthritis   . COVID-19 virus infection 10/2020  . Depression   . Diverticulosis   . History of Bell's palsy   . History of diabetes mellitus   . Hypersomnia   . OSA on CPAP    not using at this time 06/15/19  . Other and unspecified hyperlipidemia   . Personal history of chemotherapy   . Personal history of malignant neoplasm of ovary   . Personal history of radiation therapy   . Type II or unspecified type diabetes mellitus without mention of complication, not stated as  uncontrolled   . Unspecified essential hypertension   . Vertigo     Past Surgical History:  Procedure Laterality Date  . ABDOMINAL ADHESION SURGERY  12/96   exc peritoneal cyst  . BREAST BIOPSY Left 2000   stereotactic, negative  . BREAST BIOPSY Right 05/08/2019  . BREAST LUMPECTOMY Right 05/30/2019  . BREAST LUMPECTOMY WITH RADIOACTIVE SEED AND SENTINEL LYMPH NODE BIOPSY Right 05/30/2019   Procedure: RIGHT BREAST LUMPECTOMY WITH BRACKETED RADIOACTIVE SEEDS  AND SENTINEL LYMPH NODE BIOPSY;  Surgeon: Stark Klein, MD;  Location: [place];  Service: General;  Laterality: Right;  . BREAST REDUCTION SURGERY Bilateral 10/03  . EYE SURGERY Left    cataracts  . LAPAROSCOPIC SALPINGO OOPHERECTOMY Right 12/96  . 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 LAPAROSCOPY ABDOMEN DIAGNOSTIC  2/08   w/LOA, LSO  . PORTACATH PLACEMENT Left 06/16/2019   Procedure: INSERTION PORT-A-CATH;  Surgeon: Stark Klein, MD;  Location: [facility];  Service: General;  Laterality: Left;  . REDUCTION MAMMAPLASTY Bilateral 2004  . TONSILLECTOMY    . TOTAL ABDOMINAL HYSTERECTOMY W/ BILATERAL SALPINGOOPHORECTOMY  5/94   secondary to uterine fibroids  . TOTAL KNEE ARTHROPLASTY Right 05/12/2013   Procedure: TOTAL KNEE ARTHROPLASTY;  Surgeon: Alta Corning, MD;  [facility]: [facility];  Service: Orthopedics;  Laterality: Right;  . TRIGGER FINGER RELEASE  07/06/2012   Procedure: RELEASE TRIGGER FINGER/A-1 PULLEY;  Surgeon: Alta Corning, MD;  Location: [place];  Service: Orthopedics;  Laterality: Left;  left ring finger  . TUBAL LIGATION      Current Outpatient Medications  Medication Sig Dispense Refill  . ACCU-CHEK AVIVA PLUS test strip     . Accu-Chek Softclix Lancets lancets     . anastrozole (ARIMIDEX) 1 MG tablet Take 1 tablet (1 mg total) by mouth daily. 90 tablet 3  . aspirin EC 81 MG tablet Take 81 mg by mouth daily.    Marland Kitchen atorvastatin (LIPITOR) 40 MG tablet  Take 40 mg by mouth daily at 6 PM.    . Calcium Carb-Cholecalciferol (CALCIUM 600+D3 PO) Take 1 tablet by mouth 2 (two) times a day.    . cholecalciferol (VITAMIN D3) 25 MCG (1000 UT) tablet Take 1,000 Units by mouth daily.    . citalopram (CELEXA) 10 MG tablet Take 10 mg by mouth daily.    Marland Kitchen gabapentin (NEURONTIN) 100 MG capsule Take by mouth.    . insulin detemir (LEVEMIR) 100 UNIT/ML injection Inject 12-15 Units into the skin See admin instructions. Inject 15 units subcutaneously in the morning & 12 units subcutaneously at night.    . lidocaine-prilocaine (EMLA) cream Apply to affected area once 30 g 3  . LORazepam (ATIVAN) 0.5 MG tablet TAKE 1 TABLET BY MOUTH AT BEDTIME AS NEEDED FOR SLEEP 30 tablet 0  . losartan-hydrochlorothiazide (HYZAAR) 100-25 MG tablet Take 1 tablet by mouth daily.  4  . metFORMIN (GLUCOPHAGE) 500 MG tablet Take 500 mg by mouth every morning.     . metoprolol succinate (TOPROL-XL) 100 MG 24 hr tablet Take 100 mg by mouth daily.     No current facility-administered medications for this visit.    Family History  Problem Relation Age of Onset  . Lung cancer Mother   . Colon cancer Father   . Stroke Brother   . Stroke Sister   . Pulmonary Hypertension Sister     Review of Systems  All other systems reviewed and are negative.   Exam:   LMP 02/02/1993 (Exact Date)   Weight change: @WEIGHTCHANGE @ Height:      Ht Readings from Last 3 Encounters:  06/18/20 5' 3.5" (1.613 m)  06/03/20 5' 3.5" (1.613 m)  05/29/20 5' 3.5" (1.613 m)    General appearance: alert, cooperative and appears stated age Head: Normocephalic, without obvious abnormality, atraumatic Neck: no adenopathy, supple, symmetrical, trachea midline and thyroid normal to inspection and palpation Breasts: radiation changes in the right breast, no distinct lumps. No skin changes.  Abdomen: soft, non-tender; non distended,  no masses,  no organomegaly Extremities: extremities normal, atraumatic, no  cyanosis or edema Skin: Skin color, texture, turgor normal. No rashes or lesions Lymph nodes: Cervical, supraclavicular, and axillary nodes normal. No abnormal inguinal nodes palpated Neurologic: Grossly normal   Pelvic: External genitalia:  no lesions              Urethra:  normal appearing urethra with no masses, tenderness or lesions              Bartholins and Skenes: normal                 Vagina: normal appearing vagina with normal color and discharge, no lesions              Cervix: absent  Bimanual Exam:  Uterus:  uterus absent              Adnexa: no mass, fullness, tenderness               Rectovaginal: Confirms               Anus:  normal sphincter tone, no lesions  Terence Lux chaperoned for the exam.  1. GYN exam for high-risk Medicare patient Normal exam Colonoscopy in 2024 DEXA was normal in 6/21  2. GSI (genuine stress incontinence), female Mild, information on kegels given and discussed  3. History of breast cancer Discussed breast self exams Mammogram in 6/22

## 2020-11-21 NOTE — Patient Instructions (Addendum)
EXERCISE   We recommended that you start or continue a regular exercise program for good health. Physical activity is anything that gets your body moving, some is better than none. The CDC recommends 150 minutes per week of Moderate-Intensity Aerobic Activity and 2 or more days of Muscle Strengthening Activity.  Benefits of exercise are limitless: helps weight loss/weight maintenance, improves mood and energy, helps with depression and anxiety, improves sleep, tones and strengthens muscles, improves balance, improves bone density, protects from chronic conditions such as heart disease, high blood pressure and diabetes and so much more. To learn more visit: https://www.cdc.gov/physicalactivity/index.html  DIET: Good nutrition starts with a healthy diet of fruits, vegetables, whole grains, and lean protein sources. Drink plenty of water for hydration. Minimize empty calories, sodium, sweets. For more information about dietary recommendations visit: https://health.gov/our-work/nutrition-physical-activity/dietary-guidelines and https://www.myplate.gov/  ALCOHOL:  Women should limit their alcohol intake to no more than 7 drinks/beers/glasses of wine (combined, not each!) per week. Moderation of alcohol intake to this level decreases your risk of breast cancer and liver damage.  If you are concerned that you may have a problem, or your friends have told you they are concerned about your drinking, there are many resources to help. A well-known program that is free, effective, and available to all people all over the nation is Alcoholics Anonymous.  Check out this site to learn more: https://www.aa.org/   CALCIUM AND VITAMIN D:  Adequate intake of calcium and Vitamin D are recommended for bone health.  You should be getting between 1000-1200 mg of calcium and 800 units of Vitamin D daily between diet and supplements  PAP SMEARS:  Pap smears, to check for cervical cancer or precancers,  have traditionally been  done yearly, scientific advances have shown that most women can have pap smears less often.  However, every woman still should have a physical exam from her gynecologist every year. It will include a breast check, inspection of the vulva and vagina to check for abnormal growths or skin changes, a visual exam of the cervix, and then an exam to evaluate the size and shape of the uterus and ovaries. We will also provide age appropriate advice regarding health maintenance, like when you should have certain vaccines, screening for sexually transmitted diseases, bone density testing, colonoscopy, mammograms, etc.   MAMMOGRAMS:  All women over 40 years old should have a routine mammogram.   COLON CANCER SCREENING: Now recommend starting at age 45. At this time colonoscopy is not covered for routine screening until 50. There are take home tests that can be done between 45-49.   COLONOSCOPY:  Colonoscopy to screen for colon cancer is recommended for all women at age 50.  We know, you hate the idea of the prep.  We agree, BUT, having colon cancer and not knowing it is worse!!  Colon cancer so often starts as a polyp that can be seen and removed at colonscopy, which can quite literally save your life!  And if your first colonoscopy is normal and you have no family history of colon cancer, most women don't have to have it again for 10 years.  Once every ten years, you can do something that may end up saving your life, right?  We will be happy to help you get it scheduled when you are ready.  Be sure to check your insurance coverage so you understand how much it will cost.  It may be covered as a preventative service at no cost, but you should check   your particular policy.      Breast Self-Awareness Breast self-awareness means being familiar with how your breasts look and feel. It involves checking your breasts regularly and reporting any changes to your health care provider. Practicing breast self-awareness is  important. A change in your breasts can be a sign of a serious medical problem. Being familiar with how your breasts look and feel allows you to find any problems early, when treatment is more likely to be successful. All women should practice breast self-awareness, including women who have had breast implants. How to do a breast self-exam One way to learn what is normal for your breasts and whether your breasts are changing is to do a breast self-exam. To do a breast self-exam: Look for Changes  1. Remove all the clothing above your waist. 2. Stand in front of a mirror in a room with good lighting. 3. Put your hands on your hips. 4. Push your hands firmly downward. 5. Compare your breasts in the mirror. Look for differences between them (asymmetry), such as: ? Differences in shape. ? Differences in size. ? Puckers, dips, and bumps in one breast and not the other. 6. Look at each breast for changes in your skin, such as: ? Redness. ? Scaly areas. 7. Look for changes in your nipples, such as: ? Discharge. ? Bleeding. ? Dimpling. ? Redness. ? A change in position. Feel for Changes Carefully feel your breasts for lumps and changes. It is best to do this while lying on your back on the floor and again while sitting or standing in the shower or tub with soapy water on your skin. Feel each breast in the following way:  Place the arm on the side of the breast you are examining above your head.  Feel your breast with the other hand.  Start in the nipple area and make  inch (2 cm) overlapping circles to feel your breast. Use the pads of your three middle fingers to do this. Apply light pressure, then medium pressure, then firm pressure. The light pressure will allow you to feel the tissue closest to the skin. The medium pressure will allow you to feel the tissue that is a little deeper. The firm pressure will allow you to feel the tissue close to the ribs.  Continue the overlapping circles,  moving downward over the breast until you feel your ribs below your breast.  Move one finger-width toward the center of the body. Continue to use the  inch (2 cm) overlapping circles to feel your breast as you move slowly up toward your collarbone.  Continue the up and down exam using all three pressures until you reach your armpit.  Write Down What You Find  Write down what is normal for each breast and any changes that you find. Keep a written record with breast changes or normal findings for each breast. By writing this information down, you do not need to depend only on memory for size, tenderness, or location. Write down where you are in your menstrual cycle, if you are still menstruating. If you are having trouble noticing differences in your breasts, do not get discouraged. With time you will become more familiar with the variations in your breasts and more comfortable with the exam. How often should I examine my breasts? Examine your breasts every month. If you are breastfeeding, the best time to examine your breasts is after a feeding or after using a breast pump. If you menstruate, the best time to   examine your breasts is 5-7 days after your period is over. During your period, your breasts are lumpier, and it may be more difficult to notice changes. When should I see my health care provider? See your health care provider if you notice:  A change in shape or size of your breasts or nipples.  A change in the skin of your breast or nipples, such as a reddened or scaly area.  Unusual discharge from your nipples.  A lump or thick area that was not there before.  Pain in your breasts.  Anything that concerns you.   Kegel Exercises  Kegel exercises can help strengthen your pelvic floor muscles. The pelvic floor is a group of muscles that support your rectum, small intestine, and bladder. In females, pelvic floor muscles also help support the womb (uterus). These muscles help you  control the flow of urine and stool. Kegel exercises are painless and simple, and they do not require any equipment. Your provider may suggest Kegel exercises to:  Improve bladder and bowel control.  Improve sexual response.  Improve weak pelvic floor muscles after surgery to remove the uterus (hysterectomy) or pregnancy (females).  Improve weak pelvic floor muscles after prostate gland removal or surgery (males). Kegel exercises involve squeezing your pelvic floor muscles, which are the same muscles you squeeze when you try to stop the flow of urine or keep from passing gas. The exercises can be done while sitting, standing, or lying down, but it is best to vary your position. Exercises How to do Kegel exercises: 1. Squeeze your pelvic floor muscles tight. You should feel a tight lift in your rectal area. If you are a female, you should also feel a tightness in your vaginal area. Keep your stomach, buttocks, and legs relaxed. 2. Hold the muscles tight for up to 10 seconds. 3. Breathe normally. 4. Relax your muscles. 5. Repeat as told by your health care provider. Repeat this exercise daily as told by your health care provider. Continue to do this exercise for at least 4-6 weeks, or for as long as told by your health care provider. You may be referred to a physical therapist who can help you learn more about how to do Kegel exercises. Depending on your condition, your health care provider may recommend:  Varying how long you squeeze your muscles.  Doing several sets of exercises every day.  Doing exercises for several weeks.  Making Kegel exercises a part of your regular exercise routine. This information is not intended to replace advice given to you by your health care provider. Make sure you discuss any questions you have with your health care provider. Document Revised: 01/26/2020 Document Reviewed: 05/11/2018 Elsevier Patient Education  [employer].

## 2020-12-01 NOTE — Progress Notes (Signed)
Patient Care Team: Nolene Ebbs, MD as PCP - General (Internal Medicine) Nicholas Lose, MD as Consulting Physician (Hematology and Oncology) Stark Klein, MD as Consulting Physician (General Surgery) Gery Pray, MD as Consulting Physician (Radiation Oncology)  DIAGNOSIS:    ICD-10-CM   1. Malignant neoplasm of upper-inner quadrant of right breast in female, estrogen receptor positive ([place])  C50.211    Z17.0     SUMMARY OF ONCOLOGIC HISTORY: Oncology History  Malignant neoplasm of upper-inner quadrant of right breast in female, estrogen receptor positive ([place])  04/12/2019 Cancer Staging   Staging form: Breast, AJCC 8th Edition - Clinical stage from 04/12/2019: Stage IA (cT1b, cN0, cM0, G3, ER+, PR-, HER2+)    04/19/2019 Initial Diagnosis   Screening mammogram detected right breast asymmetry. Diagnostic mammogram and US showed 2 indeterminate right breast masses, 84m at 1 o'clock, 732mat 12 o'clock, with no axillary adenopathy. 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    06/06/2019 Cancer Staging   Staging form: Breast, AJCC 8th Edition - Pathologic: Stage IA (pT1b, pN0, cM0, G3, ER+, PR-, HER2+)   06/27/2019 - 04/16/2020 Chemotherapy   PACLitaxel (TAXOL) 174 mg in sodium chloride 0.9 % 250 mL chemo infusion (</= 8029m2), 80 mg/m2 = 174 mg, Intravenous,  Once, 3 of 3 cycles. Dose modification: 65 mg/m2 (original dose 80 mg/m2, Cycle 3, Reason: Dose not tolerated). Administration: 174 mg (06/27/2019), 174 mg (07/04/2019), 174 mg (07/25/2019), 174 mg (07/11/2019), 174 mg (07/18/2019), 174 mg (08/01/2019), 174 mg (08/08/2019), 174 mg (08/15/2019), 138 mg (08/22/2019)  trastuzumab-anns (KANJINTI) 399 mg in sodium chloride 0.9 % 250 mL chemo infusion, 4 mg/kg = 399 mg (100 % of original dose 4 mg/kg), Intravenous,   Once, 14 of 17 cycles. Dose modification: 4 mg/kg (original dose 4 mg/kg, Cycle 1, Reason: Other (see comments), Comment: Biosimilar Conversion; [facility] mg/kg (original dose 2 mg/kg, Cycle 3, Reason: Provider Judgment), 6 mg/kg (original dose 2 mg/kg, Cycle 4, Reason: Other (see comments), Comment: switch to maintenance q3 weeks), 600 mg (original dose 2 mg/kg, Cycle 4, Reason: Other (see comments), Comment: maint q3 weeks), 525 mg (original dose 2 mg/kg, Cycle 11, Reason: Other (see comments), Comment: Weight loss). Administration: 399 mg (06/27/2019), 189 mg (07/04/2019), 189 mg (07/11/2019), 189 mg (07/18/2019), 189 mg (07/25/2019), 189 mg (08/01/2019), 189 mg (08/08/2019), 189 mg (08/15/2019), 189 mg (08/22/2019), 600 mg (08/29/2019), 600 mg (09/19/2019), 600 mg (10/10/2019), 600 mg (10/31/2019), 600 mg (11/21/2019), 600 mg (12/12/2019), 600 mg (01/02/2020), 525 mg (01/23/2020), 525 mg (02/13/2020), 600 mg (03/05/2020), 600 mg (03/26/2020), 600 mg (04/16/2020)   09/25/2019 - 10/27/2019 Radiation Therapy   The patient initially received a dose of 40.05 Gy in 15 fractions to the breast using whole-breast tangent fields. This was delivered using a 3-D conformal technique. The pt received a boost delivering an additional 10 Gy in 5 fractions using a electron boost with 51m83mlectrons. The total dose was 50.05 Gy.   11/2019 - 11/2026 Anti-estrogen oral therapy   Anastrozole     CHIEF COMPLIANT: Follow-up of breast cancer on anastrozole   INTERVAL HISTORY: Kathy Reese 69 y27. with above-mentioned history of breast cancerwho underwent a lumpectomy,adjuvant chemotherapy,radiation, Herceptin maintenance,and is currently on antiestrogen therapy with anastrozole.She presents to the clinic follow-up.   She denies any trouble taking anastrozole.  She does have occasional hot flashes.  She is able to handle anastrozole fairly well.  She gets [facility] from [facility].  She feels lumpiness in the surgical  scar.  ALLERGIES:  has No Known Allergies.  MEDICATIONS:  Current Outpatient Medications  Medication Sig Dispense Refill  . ACCU-CHEK AVIVA PLUS test strip     . Accu-Chek Softclix Lancets lancets     . anastrozole (ARIMIDEX) 1 MG tablet Take 1 tablet (1 mg total) by mouth daily. 90 tablet 3  . aspirin EC 81 MG tablet Take 81 mg by mouth daily.    Marland Kitchen atorvastatin (LIPITOR) 40 MG tablet Take 40 mg by mouth daily at 6 PM.    . Calcium Carb-Cholecalciferol (CALCIUM 600+D3 PO) Take 1 tablet by mouth 2 (two) times a day.    . cholecalciferol (VITAMIN D3) 25 MCG (1000 UT) tablet Take 1,000 Units by mouth daily.    . citalopram (CELEXA) 10 MG tablet Take 10 mg by mouth daily.    Marland Kitchen gabapentin (NEURONTIN) 100 MG capsule Take by mouth.    . insulin detemir (LEVEMIR) 100 UNIT/ML injection Inject 12-15 Units into the skin See admin instructions. Inject 15 units subcutaneously in the morning & 12 units subcutaneously at night.    . lidocaine-prilocaine (EMLA) cream Apply to affected area once 30 g 3  . LORazepam (ATIVAN) 0.5 MG tablet TAKE 1 TABLET BY MOUTH AT BEDTIME AS NEEDED FOR SLEEP 30 tablet 0  . losartan-hydrochlorothiazide (HYZAAR) 100-25 MG tablet Take 1 tablet by mouth daily.  4  . metFORMIN (GLUCOPHAGE) 500 MG tablet Take 500 mg by mouth every morning.     . metoprolol succinate (TOPROL-XL) 100 MG 24 hr tablet Take 100 mg by mouth daily.     No current facility-administered medications for this visit.    PHYSICAL EXAMINATION: ECOG PERFORMANCE STATUS: 1 - Symptomatic but completely ambulatory  Vitals:   12/02/20 0931  BP: (!) 146/67  Pulse: (!) 53  Resp: 18  Temp: 97.9 F (36.6 C)  SpO2: 100%   Filed Weights   12/02/20 0931  Weight: 218 lb 9.6 oz (99.2 kg)    BREAST: No palpable masses or nodules in either right or left breasts. No palpable axillary supraclavicular or infraclavicular adenopathy no breast tenderness or nipple discharge. (exam performed in the presence of a  chaperone)  LABORATORY DATA:  I have reviewed the data as listed CMP Latest Ref Rng & Units 10/14/2020 05/07/2020 04/16/2020  Glucose 70 - 99 mg/dL 149(H) 155(H) 123(H)  BUN 8 - 23 mg/dL 19 22 26(H)  Creatinine 0.44 - 1.00 mg/dL 0.78 0.86 0.97  Sodium 135 - 145 mmol/L 135 139 140  Potassium 3.5 - 5.1 mmol/L 4.7 4.3 4.2  Chloride 98 - 111 mmol/L 100 106 105  CO2 22 - 32 mmol/L _0 Calcium 8.9 - 10.3 mg/dL 9.2 9.6 9.9  Total Protein 6.5 - 8.1 g/dL - 7.1 7.2  Total Bilirubin 0.3 - 1.2 mg/dL - 0.7 1.0  Alkaline Phos 38 - 126 U/L - 127(H) 114  AST 15 - 41 U/L - 22 19  ALT 0 - 44 U/L - 25 26    Lab Results  Component Value Date   WBC 6.7 05/07/2020   HGB 11.5 (L) 05/07/2020   HCT 35.4 (L) 05/07/2020   MCV 90.1 05/07/2020   PLT 195 05/07/2020   NEUTROABS 4.1 05/07/2020    ASSESSMENT & PLAN:  Malignant neoplasm of upper-inner quadrant of right breast in female,  estrogen receptor positive ([place]) 04/19/2019:Screening mammogram detected right breast asymmetry. Diagnostic mammogram and US showed 2 indeterminate right breast masses, 42m at 1 o'clock, 729mat 12 o'clock, with no axillary adenopathy. 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 Stage Ia  Treatment plan: 1.Adjuvant chemo with Taxol Herceptinx9 cycles(stopped for neuropathy)06/27/2019-08/22/2019 2. Adjuvant radiation therapy12/22/2020- 10/27/19 3. Adjuvant antiestrogen therapywith anastrozolestarted2/16/2021 ------------------------------------------------------------------------------------------------------------------------------------------------------- Current treatment:Herceptin maintenance therapy(to be completed September 2021)with anastrozole 12/21/2019: Echocardiogram EF 60 to  65%  Herceptintoxicities:No further issues with diarrhea  Chemo-induced peripheral neuropathy:grade 1, improving Chemotherapy-induced anemia:Hemoglobin is 11.5 previously  Breast cancer surveillance: Mammogram 03/29/2020: Benign breast density category B Bone density: 03/29/2020: T score 0: Normal  Echocardiogram 03/25/2020: EF 60 to 65%   She finished Herceptin today 06/18/2020. Return to clinic in 1 year for follow-up    No orders of the defined types were placed in this encounter.  The patient has a good understanding of the overall plan. she agrees with it. she will call with any problems that may develop before the next visit here.  Total time spent: 20 mins including face to face time and time spent for planning, charting and coordination of care  ViRulon EisenmengerMD, MPH 12/02/2020  I, MoCloyde Reamsorshimer, am acting as scribe for Dr. ViNicholas Lose I have reviewed the above documentation for accuracy and completeness, and I agree with the above.

## 2020-12-01 NOTE — Assessment & Plan Note (Signed)
04/19/2019:Screening mammogram detected right breast asymmetry. Diagnostic mammogram and US showed 2 indeterminate right breast masses, 51m at 1 o'clock, 761mat 12 o'clock, with no axillary adenopathy. 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 Stage Ia  05/30/2019: [facility], grade 3, 0.6cm, clear margins, 3 axillary lymph nodes negative.HER-2 positive (3+), ER+ (80%), PR -, Ki67 40%. Stage Ia  Treatment plan: 1.Adjuvant chemo with Taxol Herceptinx9 cycles(stopped for neuropathy)06/27/2019-08/22/2019 2. Adjuvant radiation therapy12/22/2020- 10/27/19 3. Adjuvant antiestrogen therapywith anastrozolestarted2/16/2021 ------------------------------------------------------------------------------------------------------------------------------------------------------- Current treatment:Herceptin maintenance therapy(to be completed September 2021)with anastrozole 12/21/2019: Echocardiogram EF 60 to 65%  Herceptintoxicities:No further issues with diarrhea  Chemo-induced peripheral neuropathy:grade 1, improving Chemotherapy-induced anemia:Hemoglobin is 11.5 previously  Breast cancer surveillance: Mammogram 03/29/2020: Benign breast density category B Bone density: 03/29/2020: T score 0: Normal  Echocardiogram 03/25/2020: EF 60 to 65%   She finished Herceptin today 06/18/2020. Return to clinic in 1 year for follow-up

## 2020-12-02 ENCOUNTER — Other Ambulatory Visit: Payer: Self-pay

## 2020-12-02 ENCOUNTER — Inpatient Hospital Stay: Payer: Medicare Other

## 2020-12-02 ENCOUNTER — Inpatient Hospital Stay: Payer: Medicare Other | Attending: Hematology and Oncology | Admitting: Hematology and Oncology

## 2020-12-02 ENCOUNTER — Ambulatory Visit: Payer: Medicare Other | Admitting: Hematology and Oncology

## 2020-12-02 DIAGNOSIS — Z9221 Personal history of antineoplastic chemotherapy: Secondary | ICD-10-CM | POA: Diagnosis not present

## 2020-12-02 DIAGNOSIS — Z923 Personal history of irradiation: Secondary | ICD-10-CM | POA: Diagnosis not present

## 2020-12-02 DIAGNOSIS — D6481 Anemia due to antineoplastic chemotherapy: Secondary | ICD-10-CM | POA: Diagnosis not present

## 2020-12-02 DIAGNOSIS — Z170 Estrogen receptor positive status [ER+]: Secondary | ICD-10-CM

## 2020-12-02 DIAGNOSIS — Z95828 Presence of other vascular implants and grafts: Secondary | ICD-10-CM

## 2020-12-02 DIAGNOSIS — T451X5A Adverse effect of antineoplastic and immunosuppressive drugs, initial encounter: Secondary | ICD-10-CM | POA: Insufficient documentation

## 2020-12-02 DIAGNOSIS — G620 Drug-induced polyneuropathy: Secondary | ICD-10-CM | POA: Diagnosis not present

## 2020-12-02 DIAGNOSIS — C50211 Malignant neoplasm of upper-inner quadrant of right female breast: Secondary | ICD-10-CM | POA: Insufficient documentation

## 2020-12-02 MED ORDER — SODIUM CHLORIDE 0.9% FLUSH
10.0000 mL | Freq: Once | INTRAVENOUS | Status: AC
  Administered 2020-12-02: 10 mL
  Filled 2020-12-02: qty 10

## 2020-12-02 MED ORDER — ANASTROZOLE 1 MG PO TABS: 1.0000 mg | ORAL_TABLET | Freq: Every day | ORAL | 3 refills | Status: DC

## 2020-12-02 MED ORDER — HEPARIN SOD (PORK) LOCK FLUSH 100 UNIT/ML IV SOLN
500.0000 [IU] | Freq: Once | INTRAVENOUS | Status: AC
  Administered 2020-12-02: 500 [IU]
  Filled 2020-12-02: qty 5

## 2020-12-02 NOTE — Patient Instructions (Signed)

## 2020-12-03 ENCOUNTER — Other Ambulatory Visit: Payer: Self-pay | Admitting: General Surgery

## 2020-12-04 ENCOUNTER — Telehealth: Payer: Self-pay | Admitting: Hematology and Oncology

## 2020-12-04 NOTE — Telephone Encounter (Signed)
Scheduled per 2/28 los. Pt will receive an updated appt calendar per next visit appt notes

## 2020-12-16 ENCOUNTER — Ambulatory Visit: Payer: Medicare Other | Admitting: Hematology and Oncology

## 2020-12-18 ENCOUNTER — Emergency Department (HOSPITAL_BASED_OUTPATIENT_CLINIC_OR_DEPARTMENT_OTHER): Payer: Medicare Other

## 2020-12-18 ENCOUNTER — Other Ambulatory Visit: Payer: Self-pay

## 2020-12-18 ENCOUNTER — Emergency Department (HOSPITAL_BASED_OUTPATIENT_CLINIC_OR_DEPARTMENT_OTHER): Payer: Medicare Other | Admitting: Radiology

## 2020-12-18 ENCOUNTER — Emergency Department (HOSPITAL_BASED_OUTPATIENT_CLINIC_OR_DEPARTMENT_OTHER)
Admission: EM | Admit: 2020-12-18 | Discharge: 2020-12-18 | Disposition: A | Discharge status: Home / Self Care | Payer: Medicare Other | Attending: Emergency Medicine | Admitting: Emergency Medicine

## 2020-12-18 ENCOUNTER — Ambulatory Visit
Admission: EM | Admit: 2020-12-18 | Discharge: 2020-12-18 | Disposition: A | Discharge status: Home / Self Care | Payer: Medicare Other | Attending: Family Medicine | Admitting: Family Medicine

## 2020-12-18 ENCOUNTER — Ambulatory Visit (INDEPENDENT_AMBULATORY_CARE_PROVIDER_SITE_OTHER): Payer: Medicare Other

## 2020-12-18 DIAGNOSIS — S43004A Unspecified dislocation of right shoulder joint, initial encounter: Secondary | ICD-10-CM

## 2020-12-18 DIAGNOSIS — S4991XA Unspecified injury of right shoulder and upper arm, initial encounter: Secondary | ICD-10-CM | POA: Diagnosis present

## 2020-12-18 DIAGNOSIS — M25511 Pain in right shoulder: Secondary | ICD-10-CM

## 2020-12-18 DIAGNOSIS — W19XXXA Unspecified fall, initial encounter: Secondary | ICD-10-CM

## 2020-12-18 DIAGNOSIS — W010XXA Fall on same level from slipping, tripping and stumbling without subsequent striking against object, initial encounter: Secondary | ICD-10-CM | POA: Diagnosis not present

## 2020-12-18 DIAGNOSIS — S43014A Anterior dislocation of right humerus, initial encounter: Secondary | ICD-10-CM | POA: Diagnosis not present

## 2020-12-18 MED ORDER — SODIUM CHLORIDE 0.9 % IV BOLUS
500.0000 mL | Freq: Once | INTRAVENOUS | Status: AC
  Administered 2020-12-18: 500 mL via INTRAVENOUS

## 2020-12-18 MED ORDER — PROPOFOL 10 MG/ML IV BOLUS
INTRAVENOUS | Status: AC | PRN
  Administered 2020-12-18: 50 mg via INTRAVENOUS

## 2020-12-18 MED ORDER — ONDANSETRON HCL 4 MG/2ML IJ SOLN
4.0000 mg | Freq: Once | INTRAMUSCULAR | Status: AC
  Administered 2020-12-18: 4 mg via INTRAVENOUS
  Filled 2020-12-18: qty 2

## 2020-12-18 MED ORDER — PROPOFOL 1000 MG/100ML IV EMUL
INTRAVENOUS | Status: AC
  Filled 2020-12-18: qty 100

## 2020-12-18 MED ORDER — PROPOFOL 10 MG/ML IV BOLUS
1.0000 mg/kg | Freq: Once | INTRAVENOUS | Status: AC
  Administered 2020-12-18: 50 mg via INTRAVENOUS

## 2020-12-18 MED ORDER — FENTANYL CITRATE (PF) 100 MCG/2ML IJ SOLN
50.0000 ug | Freq: Once | INTRAMUSCULAR | Status: AC
Start: 2020-12-18 — End: 2020-12-18
  Administered 2020-12-18: 50 ug via INTRAVENOUS
  Filled 2020-12-18: qty 2

## 2020-12-18 NOTE — ED Notes (Signed)
Pt. placed on nasal cannula/EtC02 monitor with 2 lpm Oxygen for conscious sedation procedure, AMBU/suction s/u prior, tolerated well with 86mmHg post procedure reading, no complications, RT to monitor.

## 2020-12-18 NOTE — ED Provider Notes (Signed)
[facility] EMERGENCY DEPT Provider Note   CSN: [id number] Arrival date & time: 12/18/20  1518     History Chief Complaint  Patient presents with  . Shoulder Injury    Kathy Reese is a 70 y.o. female.  The history is provided by the patient.  Shoulder Pain Location:  Shoulder Shoulder location:  R shoulder Injury: yes   Mechanism of injury: fall   Pain details:    Quality:  Aching   Radiates to:  Does not radiate Relieved by:  Nothing Worsened by:  Movement Associated symptoms: decreased range of motion   Associated symptoms: no back pain, no fatigue, no fever, no muscle weakness, no neck pain, no numbness, no stiffness, no swelling and no tingling        No past medical history on file.  There are no problems to display for this patient.     OB History   No obstetric history on file.     No family history on file.     Home Medications Prior to Admission medications   Not on File    Allergies    Patient has no known allergies.  Review of Systems   Review of Systems  Constitutional: Negative for fatigue and fever.  Respiratory: Negative for shortness of breath.   Cardiovascular: Negative for chest pain.  Musculoskeletal: Positive for arthralgias. Negative for back pain, neck pain, neck stiffness and stiffness.  Skin: Negative for color change, pallor, rash and wound.  Neurological: Negative for syncope, weakness and numbness.    Physical Exam Updated Vital Signs  ED Triage Vitals  Enc Vitals Group     BP 12/18/20 1535 (!) (P) 188/84     Pulse Rate 12/18/20 1535 (P) 86     Resp 12/18/20 1535 (P) 19     Temp 12/18/20 1535 (P) 98.6 F (37 C)     Temp Source 12/18/20 1535 (P) Oral     SpO2 12/18/20 1535 (P) 100 %     Weight 12/18/20 1527 213 lb (96.6 kg)     Height 12/18/20 1527 5\' 3"  (1.6 m)     Head Circumference --      Peak Flow --      Pain Score 12/18/20 1526 0     Pain Loc --      Pain Edu? --      Excl. in [place]? --      Physical Exam Constitutional:      General: She is not in acute distress.    Appearance: She is not ill-appearing.  HENT:     Head: Normocephalic and atraumatic.  Cardiovascular:     Pulses: Normal pulses.  Musculoskeletal:        General: Tenderness present. No swelling or deformity.     Cervical back: Normal range of motion. No tenderness.     Comments: Tenderness to the right shoulder, range of motion may be limited in the right upper extremity as well, no tenderness over the elbow or wrist  Skin:    General: Skin is warm.     Findings: No erythema or lesion.  Neurological:     General: No focal deficit present.     Mental Status: She is alert.     Sensory: No sensory deficit.     Motor: No weakness.     ED Results / Procedures / Treatments   Labs (all labs ordered are listed, but only abnormal results are displayed) Labs Reviewed - No data to display  EKG None  Radiology DG Shoulder Right  Result Date: 12/18/2020 CLINICAL DATA:  Fall. EXAM: RIGHT SHOULDER - 2+ VIEW COMPARISON:  None. FINDINGS: Anterior dislocation. Possible small fracture fragment adjacent to the inferior glenoid. Irregularity of the greater tuberosity likely due to degenerative change. Degenerative change and mild spurring in the [facility] joint. IMPRESSION: Anterior dislocation of the shoulder. Electronically Signed   By: Franchot Gallo M.D.   On: 12/18/2020 16:12   DG Shoulder Right Portable  Result Date: 12/18/2020 CLINICAL DATA:  Status post reduction EXAM: PORTABLE RIGHT SHOULDER COMPARISON:  None. FINDINGS: Interval reduction of the previously seen dislocated right shoulder. Small bone fragment noted adjacent to the humeral head laterally, likely small avulsed fragment. Degenerative changes in the [facility] joint. IMPRESSION: Interval reduction.  Suspect small avulsed fragment laterally. Electronically Signed   By: Rolm Baptise M.D.   On: 12/18/2020 16:52    Procedures .Sedation  Date/Time: 12/18/2020  4:20 PM Performed by: Lennice Sites, DO Authorized by: Lennice Sites, DO   Consent:    Consent obtained:  Written and verbal   Consent given by:  Patient   Risks discussed:  Allergic reaction, dysrhythmia, nausea, inadequate sedation, prolonged hypoxia resulting in organ damage, prolonged sedation necessitating reversal, respiratory compromise necessitating ventilatory assistance and intubation and vomiting   Alternatives discussed:  Analgesia without sedation Universal protocol:    Procedure explained and questions answered to patient or proxy's satisfaction: yes     Relevant documents present and verified: yes     Test results available: yes     Imaging studies available: yes     Site/side marked: no     Immediately prior to procedure, a time out was called: yes     Patient identity confirmed:  Arm band Indications:    Procedure performed:  Dislocation reduction (possible fracture as well)   Procedure necessitating sedation performed by:  Physician performing sedation Pre-sedation assessment:    Time since last food or drink:  8 hours   ASA classification: class 2 - patient with mild systemic disease     Mouth opening:  3 or more finger widths   Thyromental distance:  4 finger widths   Mallampati score:  II - soft palate, uvula, fauces visible   Neck mobility: normal     Pre-sedation assessments completed and reviewed: airway patency, cardiovascular function, hydration status, mental status, nausea/vomiting, pain level, respiratory function and temperature     Pre-sedation assessment completed:  12/18/2020 4:21 PM Immediate pre-procedure details:    Reassessment: Patient reassessed immediately prior to procedure     Reviewed: vital signs     Verified: bag valve mask available, emergency equipment available, intubation equipment available, IV patency confirmed, oxygen available, reversal medications available and suction available   Procedure details (see MAR for exact dosages):     Preoxygenation:  Nasal cannula   Sedation:  Propofol   Intended level of sedation: deep   Intra-procedure monitoring:  Blood pressure monitoring, cardiac monitor, continuous capnometry, continuous pulse oximetry, frequent LOC assessments and frequent vital sign checks   Total Provider sedation time (minutes):  19 Post-procedure details:    Post-sedation assessment completed:  12/18/2020 4:39 PM   Attendance: Constant attendance by certified staff until patient recovered     Recovery: Patient returned to pre-procedure baseline     Post-sedation assessments completed and reviewed: airway patency, cardiovascular function, hydration status, mental status, nausea/vomiting, pain level, respiratory function and temperature     Patient is stable for discharge or admission: yes  Procedure completion:  Tolerated well, no immediate complications .Ortho Injury Treatment  Date/Time: 12/18/2020 4:21 PM Performed by: Lennice Sites, DO Authorized by: Lennice Sites, DO   Consent:    Consent obtained:  Written and verbal   Consent given by:  Patient   Risks discussed:  Fracture, irreducible dislocation, nerve damage, recurrent dislocation, restricted joint movement and vascular damage   Alternatives discussed:  No treatment, alternative treatment, referral, immobilization and delayed treatmentInjury location: shoulder Location details: right shoulder Injury type: fracture-dislocation Dislocation type: anterior Pre-procedure neurovascular assessment: neurovascularly intact Pre-procedure distal perfusion: normal Pre-procedure neurological function: normal Pre-procedure range of motion: reduced  Patient sedated: Yes. Refer to sedation procedure documentation for details of sedation. Manipulation performed: yes Skin traction used: yes Skeletal traction used: yes Reduction successful: yes X-ray confirmed reduction: yes Immobilization: sling Splint Applied by: ED Provider Post-procedure  neurovascular assessment: post-procedure neurovascularly intact Post-procedure distal perfusion: normal Post-procedure neurological function: normal Post-procedure range of motion: normal      Medications Ordered in ED Medications  propofol (DIPRIVAN) 1000 MG/100ML infusion (  See Procedure Record 12/18/20 1655)  fentaNYL (SUBLIMAZE) injection 50 mcg (50 mcg Intravenous Given 12/18/20 1556)  propofol (DIPRIVAN) 10 mg/mL bolus/IV push 96.6 mg (50 mg Intravenous Given 12/18/20 1633)  sodium chloride 0.9 % bolus 500 mL (500 mLs Intravenous New Bag/Given 12/18/20 1626)  ondansetron (ZOFRAN) injection 4 mg (4 mg Intravenous Given 12/18/20 1626)  propofol (DIPRIVAN) 10 mg/mL bolus/IV push (50 mg Intravenous Given 12/18/20 1633)    ED Course  I have reviewed the triage vital signs and the nursing notes.  Pertinent labs & imaging results that were available during my care of the patient were reviewed by me and considered in my medical decision making (see chart for details).    MDM Rules/Calculators/A&P                          Donn Pierini Hegg is here for right shoulder pain.  Went to an urgent care and they thought that there was a dislocation.  She tripped and fell and hit her right shoulder about 4 5 hours ago.  She did not hit her head.  Not on blood thinners.  She is neurovascularly neuromuscularly intact to the right upper extremity.  She has some decreased range of motion.  Tenderness in the right shoulder.  X-ray confirmed dislocation.  Possibly a small fracture of the glenoid as well. Patient aware of possible fx.  She underwent propofol sedation with successful reduction of dislocation.  Was placed in a sling.  Understands to wear the sling at all times and do not bear any weight to the upper extremity until she follows up with orthopedics.  Recommend ice, Tylenol, Motrin for pain.  Patient tolerated sedation well.  Understands return precautions.  She was neurovascularly and neuromuscularly intact  before and after reduction.  X-ray confirmed reduction as well.  Discharged in good condition.  This chart was dictated using voice recognition software.  Despite best efforts to proofread,  errors can occur which can change the documentation meaning.    Final Clinical Impression(s) / ED Diagnoses Final diagnoses:  Dislocated shoulder, right, initial encounter    Rx / DC Orders ED Discharge Orders    None       Lennice Sites, DO 12/18/20 1700

## 2020-12-18 NOTE — ED Triage Notes (Signed)
Pt presents with c/o right shoulder pain from fall earlier

## 2020-12-18 NOTE — ED Notes (Signed)
Patient transported to X-ray 

## 2020-12-18 NOTE — ED Triage Notes (Signed)
Pt tripped and fell on her right shoulder. Pt did not hit head. No LOC, No Thinners. Pt. Sent from urgent care to ER for treatment.

## 2020-12-18 NOTE — Discharge Instructions (Addendum)
Your right shoulder was dislocated with possibly small fracture as well as discussed.  This has been relocated.  Please wear your sling at all times.  You may remove this for showers.  Do not do any heavy lifting or major movements of the right shoulder until you see orthopedics.  Recommend ice, Tylenol, Motrin for pain.

## 2020-12-22 ENCOUNTER — Other Ambulatory Visit: Payer: Self-pay | Admitting: General Surgery

## 2020-12-22 NOTE — ED Provider Notes (Signed)
[facility]    CSN: [id number] Arrival date & time: 12/18/20  1258      History   Chief Complaint Chief Complaint  Patient presents with  . Fall  . Shoulder Injury    HPI Kathy Reese is a 70 y.o. female.   Patient presenting today with right shoulder pain after a mechanical fall this afternoon where she landed directly onto the shoulder. She has almost no ROM in the arm and the arm is hanging lower than usual. Denies swelling to arm, numbness, tingling, grip weakness. So far not trying anything OTC for sxs. No past trauma to shoulder per patient. No other apparent injuries or complaints from fall. Did not hit head or lose consciousness during event.      Past Medical History:  Diagnosis Date  . Allergic rhinitis, cause unspecified   . Arthritis   . COVID-19 virus infection 10/2020  . Depression   . Diverticulosis   . History of Bell's palsy   . History of diabetes mellitus   . Hypersomnia   . OSA on CPAP    not using at this time 06/15/19  . Other and unspecified hyperlipidemia   . Personal history of chemotherapy   . Personal history of malignant neoplasm of ovary   . Personal history of radiation therapy   . Type II or unspecified type diabetes mellitus without mention of complication, not stated as uncontrolled   . Unspecified essential hypertension   . Vertigo     Patient Active Problem List   Diagnosis Date Noted  . Breast cancer ([place]) 11/21/2020  . Hyperglycemia due to type 2 diabetes mellitus ([place]) 11/21/2020  . Proteinuria 11/21/2020  . Vitamin D deficiency 11/21/2020  . COVID-19 virus infection 10/2020  . Chemotherapy-induced peripheral neuropathy ([place]) 03/26/2020  . Port-A-Cath in place 07/11/2019  . 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 Facial weakness 11/07/2018  . 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 History of  hysterectomy 10/12/2018  . History of bilateral salpingo-oophorectomy (BSO) 10/12/2018  . Osteoarthritis of right knee 05/12/2013  . HYPERLIPIDEMIA 12/30/2007  . HYPERTENSION 12/30/2007  . INSOMNIA 12/30/2007  . HYPERSOMNIA 12/30/2007  . DM w/o Complication Type II 34/19/3790  . HYPERCHOLESTEROLEMIA 12/16/2007  . EXOGENOUS OBESITY 12/16/2007  . SLEEP APNEA, OBSTRUCTIVE 12/16/2007  . ALLERGIC RHINITIS 12/16/2007    Past Surgical History:  Procedure Laterality Date  . ABDOMINAL ADHESION SURGERY  12/96   exc peritoneal cyst  . BREAST BIOPSY Left 2000   stereotactic, negative  . BREAST BIOPSY Right 05/08/2019  . BREAST LUMPECTOMY Right 05/30/2019  . BREAST LUMPECTOMY WITH RADIOACTIVE SEED AND SENTINEL LYMPH NODE BIOPSY Right 05/30/2019   Procedure: RIGHT BREAST LUMPECTOMY WITH BRACKETED RADIOACTIVE SEEDS  AND SENTINEL LYMPH NODE BIOPSY;  Surgeon: Stark Klein, MD;  Location: [facility];  Service: General;  Laterality: Right;  . BREAST REDUCTION SURGERY Bilateral 10/03  . EYE SURGERY Left    cataracts  . LAPAROSCOPIC SALPINGO OOPHERECTOMY Right 12/96  . 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 LAPAROSCOPY ABDOMEN DIAGNOSTIC  2/08   w/LOA, LSO  . PORTACATH PLACEMENT Left 06/16/2019   Procedure: INSERTION PORT-A-CATH;  Surgeon: Stark Klein, MD;  Location: [facility];  Service: General;  Laterality: Left;  . REDUCTION MAMMAPLASTY Bilateral 2004  . TONSILLECTOMY    . TOTAL ABDOMINAL HYSTERECTOMY W/ BILATERAL SALPINGOOPHORECTOMY  5/94   secondary to uterine fibroids  . TOTAL KNEE ARTHROPLASTY Right 05/12/2013   Procedure: TOTAL KNEE ARTHROPLASTY;  Surgeon: Alta Corning, MD;  Location: [facility];  Service: Orthopedics;  Laterality: Right;  . TRIGGER FINGER RELEASE  07/06/2012   Procedure: RELEASE TRIGGER FINGER/A-1 PULLEY;  Surgeon: Alta Corning, MD;  Location: [facility];  Service: Orthopedics;  Laterality: Left;  left ring finger  .  TUBAL LIGATION      OB History    Gravida  4   Para  4   Term  3   Preterm  1   AB  0   Living  3     SAB  0   IAB  0   Ectopic  0   Multiple  0   Live Births  4            Home Medications    Prior to Admission medications   Medication Sig Start Date End Date Taking? Authorizing Provider  ACCU-CHEK AVIVA PLUS test strip  08/26/19   [provider]  Accu-Chek Softclix Lancets lancets  08/26/19   [provider]  anastrozole (ARIMIDEX) 1 MG tablet Take 1 tablet (1 mg total) by mouth daily. 12/02/20   Nicholas Lose, MD  aspirin EC 81 MG tablet Take 81 mg by mouth daily.    [provider]  atorvastatin (LIPITOR) 40 MG tablet Take 40 mg by mouth daily at 6 PM.    [provider]  Calcium Carb-Cholecalciferol (CALCIUM 600+D3 PO) Take 1 tablet by mouth 2 (two) times a day.    [provider]  cholecalciferol (VITAMIN D3) 25 MCG (1000 UT) tablet Take 1,000 Units by mouth daily.    [provider]  citalopram (CELEXA) 10 MG tablet Take 10 mg by mouth daily. 04/25/20   [provider]  gabapentin (NEURONTIN) 100 MG capsule Take by mouth. 04/30/20   [provider]  insulin detemir (LEVEMIR) 100 UNIT/ML injection Inject 12-15 Units into the skin See admin instructions. Inject 15 units subcutaneously in the morning & 12 units subcutaneously at night.    [provider]  lidocaine-prilocaine (EMLA) cream Apply to affected area once 06/06/19   Nicholas Lose, MD  LORazepam (ATIVAN) 0.5 MG tablet TAKE 1 TABLET BY MOUTH AT BEDTIME AS NEEDED FOR SLEEP 07/09/20   Nicholas Lose, MD  losartan-hydrochlorothiazide (HYZAAR) 100-25 MG tablet Take 1 tablet by mouth daily. 06/19/18   [provider]  metFORMIN (GLUCOPHAGE) 500 MG tablet Take 500 mg by mouth every morning.     [provider]  metoprolol succinate (TOPROL-XL) 100 MG 24 hr tablet Take 100 mg by mouth daily. 04/25/20   [provider]    Family History Family History  Problem Relation Age of Onset  . Lung cancer Mother   . Colon cancer Father   . Stroke Brother   . Stroke Sister   . Pulmonary Hypertension Sister     Social History Social History   Tobacco Use  . Smoking status: Never Smoker  . Smokeless tobacco: Never Used  Vaping Use  . Vaping Use: Never used  Substance Use Topics  . Alcohol use: Yes    Alcohol/week: 1.0 standard drink    Types: 1 Glasses of wine per week    Comment: occasionally  . Drug use: No     Allergies   Patient has no known allergies.   Review of Systems Review of Systems PER HPI  Physical Exam Triage Vital Signs ED Triage Vitals  Enc Vitals Group     BP 12/18/20 1415 (!) 164/71     Pulse Rate 12/18/20 1415 83     Resp 12/18/20 1415 18     Temp 12/18/20 1415 (!) 97.4 F (36.3 C)     Temp src --      SpO2 12/18/20 1415 98 %     Weight --      Height --      Head Circumference --      Peak Flow --      Pain Score 12/18/20 1413 7     Pain Loc --      Pain Edu? --      Excl. in [place]? --    No data found.  Updated Vital Signs BP (!) 164/71   Pulse 83   Temp (!) 97.4 F (36.3 C)   Resp 18   LMP 02/02/1993 (Exact Date)   SpO2 98%   Visual Acuity Right Eye Distance:   Left Eye Distance:   Bilateral Distance:    Right Eye Near:   Left Eye Near:    Bilateral Near:     Physical Exam Vitals and nursing note reviewed.  Constitutional:      Comments: Appears in pain  HENT:     Head: Atraumatic.  Eyes:     Extraocular Movements: Extraocular movements intact.     Conjunctiva/sclera: Conjunctivae normal.  Cardiovascular:     Rate and Rhythm: Normal rate and regular rhythm.     Heart sounds: Normal heart sounds.  Pulmonary:     Effort: Pulmonary effort is normal.     Breath sounds: Normal breath sounds.  Musculoskeletal:        General: Tenderness, deformity and signs of injury present.     Cervical back: Normal range of motion  and neck supple.     Comments: Obvious deformity to joint right shoulder, minimal ROM of right shoulder  Skin:    General: Skin is warm and dry.     Findings: No bruising or erythema.  Neurological:     Mental Status: She is alert and oriented to person, place, and time.     Comments: Right UE neurovascularly intact  Psychiatric:        Mood and Affect: Mood normal.        Thought Content: Thought content normal.        Judgment: Judgment normal.      UC Treatments / Results  Labs (all labs ordered are listed, but only abnormal results are displayed) Labs Reviewed - No data to display  EKG   Radiology No results found.  Procedures Procedures (including critical care time)  Medications Ordered in UC Medications - No data to display  Initial Impression / Assessment and Plan / UC Course  I have reviewed the triage vital signs and the nursing notes.  Pertinent labs & imaging results that were available during my care of the patient were reviewed by me and considered in my medical decision making (see chart for details).     Right shoulder x-ray confirming anterior dislocation, also requesting attention to a possible lesion to humeral head on post-reduction films. Discussed results with patient and that she would need to go to the ED directly for reduction and further care. She is agreeable and will go via private vehicle with her ride. She is currently hemodynamically stable for transport.   Final Clinical Impressions(s) / UC Diagnoses  Final diagnoses:  Dislocation of right shoulder joint, initial encounter  Fall, initial encounter   Discharge Instructions   None    ED Prescriptions    None     PDMP not reviewed this encounter.   Merrie Roof [place], [place] 12/22/20 [phone]

## 2020-12-24 ENCOUNTER — Encounter: Payer: Self-pay | Admitting: Family Medicine

## 2020-12-24 ENCOUNTER — Ambulatory Visit (INDEPENDENT_AMBULATORY_CARE_PROVIDER_SITE_OTHER): Payer: Medicare Other | Admitting: Family Medicine

## 2020-12-24 ENCOUNTER — Other Ambulatory Visit: Payer: Self-pay

## 2020-12-24 DIAGNOSIS — M25511 Pain in right shoulder: Secondary | ICD-10-CM

## 2020-12-24 NOTE — Progress Notes (Signed)
Office Visit Note   Patient: Kathy Reese           Date of Birth: November 11, 1950           MRN: [id number] Visit Date: 12/24/2020 Requested by: Nolene Ebbs, MD [address] [place],  [place] [address] PCP: Nolene Ebbs, MD  Subjective: Chief Complaint  Patient presents with  . Right Shoulder - Follow-up, Injury, Pain    Fell on 12/18/20, outside her house while going down a step, landing on cement. Dislocated shoulder. Reduced at the [facility] ED. Has only had a little bit of pain at times - feels better out of sling, than in it.     HPI: She is here with right shoulder pain.  She is right-hand dominant.  On March 16 she was going down a step and fell, dislocating her shoulder.  She went to the ER and underwent closed reduction under anesthesia.  She was placed in a sling and discharged home.  She is using Tylenol occasionally for pain, but overall she feels very well.  She is anxious to get out of her sling.  No previous problems with her shoulder, no previous dislocations.  Denies any numbness or tingling in her arm.               ROS:   All other systems were reviewed and are negative.  Objective: Vital Signs: LMP 02/02/1993 (Exact Date)   Physical Exam:  General:  Alert and oriented, in no acute distress. Pulm:  Breathing unlabored. Psy:  Normal mood, congruent affect. Skin: No bruising Right shoulder: There is no tenderness to palpation around the shoulder today, it feels symmetric compared to the left.  She has good range of motion of the elbow with no elbow pain.  Intrinsic hand strength is normal, sensation is normal.   Imaging: No results found.  Assessment & Plan: 1.  Doing well 6 days status post closed reduction of right shoulder glenohumeral dislocation with possible chip fracture of the glenoid. -Okay to discontinue sling but if her shoulder gets sore, she will put it back on.  Work on elbow range of motion to prevent stiffness.  Minimize  shoulder activity for at least 2 more weeks.  At that point she will give me a progress report, and if she is feeling well we will refer her to physical therapy at the [place] location to work on rotator cuff strengthening. -If she is not doing well in 2 weeks, I will see her back for recheck and repeat x-rays.     Procedures: No procedures performed        PMFS History: Patient Active Problem List   Diagnosis Date Noted  . Breast cancer ([place]) 11/21/2020  . Hyperglycemia due to type 2 diabetes mellitus ([place]) 11/21/2020  . Proteinuria 11/21/2020  . Vitamin D deficiency 11/21/2020  . COVID-19 virus infection 10/2020  . Chemotherapy-induced peripheral neuropathy ([place]) 03/26/2020  . Port-A-Cath in place 07/11/2019  . Malignant neoplasm of upper-inner quadrant of right breast in female, estrogen receptor positive ([place]) 04/19/2019  . Facial weakness 11/07/2018  . 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 History of hysterectomy 10/12/2018  . History of bilateral salpingo-oophorectomy (BSO) 10/12/2018  . Osteoarthritis of right knee 05/12/2013  . HYPERLIPIDEMIA 12/30/2007  . HYPERTENSION 12/30/2007  . INSOMNIA 12/30/2007  . HYPERSOMNIA 12/30/2007  . DM w/o Complication Type II 26/33/3545  . HYPERCHOLESTEROLEMIA 12/16/2007  . EXOGENOUS OBESITY 12/16/2007  . SLEEP APNEA,  OBSTRUCTIVE 12/16/2007  . ALLERGIC RHINITIS 12/16/2007   Past Medical History:  Diagnosis Date  . Allergic rhinitis, cause unspecified   . Arthritis   . COVID-19 virus infection 10/2020  . Depression   . Diverticulosis   . History of Bell's palsy   . History of diabetes mellitus   . Hypersomnia   . OSA on CPAP    not using at this time 06/15/19  . Other and unspecified hyperlipidemia   . Personal history of chemotherapy   . Personal history of malignant neoplasm of ovary   . Personal history of radiation therapy   . Type II or unspecified type diabetes mellitus without  mention of complication, not stated as uncontrolled   . Unspecified essential hypertension   . Vertigo     Family History  Problem Relation Age of Onset  . Lung cancer Mother   . Colon cancer Father   . Stroke Brother   . Stroke Sister   . Pulmonary Hypertension Sister     Past Surgical History:  Procedure Laterality Date  . ABDOMINAL ADHESION SURGERY  12/96   exc peritoneal cyst  . BREAST BIOPSY Left 2000   stereotactic, negative  . BREAST BIOPSY Right 05/08/2019  . BREAST LUMPECTOMY Right 05/30/2019  . BREAST LUMPECTOMY WITH RADIOACTIVE SEED AND SENTINEL LYMPH NODE BIOPSY Right 05/30/2019   Procedure: RIGHT BREAST LUMPECTOMY WITH BRACKETED RADIOACTIVE SEEDS  AND SENTINEL LYMPH NODE BIOPSY;  Surgeon: Stark Klein, MD;  Location: [facility];  Service: General;  Laterality: Right;  . BREAST REDUCTION SURGERY Bilateral 10/03  . EYE SURGERY Left    cataracts  . LAPAROSCOPIC SALPINGO OOPHERECTOMY Right 12/96  . 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 LAPAROSCOPY ABDOMEN DIAGNOSTIC  2/08   w/LOA, LSO  . PORTACATH PLACEMENT Left 06/16/2019   Procedure: INSERTION PORT-A-CATH;  Surgeon: Stark Klein, MD;  Location: [facility];  Service: General;  Laterality: Left;  . REDUCTION MAMMAPLASTY Bilateral 2004  . TONSILLECTOMY    . TOTAL ABDOMINAL HYSTERECTOMY W/ BILATERAL SALPINGOOPHORECTOMY  5/94   secondary to uterine fibroids  . TOTAL KNEE ARTHROPLASTY Right 05/12/2013   Procedure: TOTAL KNEE ARTHROPLASTY;  Surgeon: Alta Corning, MD;  Location: [facility];  Service: Orthopedics;  Laterality: Right;  . TRIGGER FINGER RELEASE  07/06/2012   Procedure: RELEASE TRIGGER FINGER/A-1 PULLEY;  Surgeon: Alta Corning, MD;  Location: [facility];  Service: Orthopedics;  Laterality: Left;  left ring finger  . TUBAL LIGATION     Social History   Occupational History  . Occupation: Fish farm manager REP    Employer: [employer]  Tobacco Use  .  Smoking status: Never Smoker  . Smokeless tobacco: Never Used  Vaping Use  . Vaping Use: Never used  Substance and Sexual Activity  . Alcohol use: Yes    Alcohol/week: 1.0 standard drink    Types: 1 Glasses of wine per week    Comment: occasionally  . Drug use: No  . Sexual activity: Not Currently    Partners: Male    Birth control/protection: Abstinence

## 2021-01-10 ENCOUNTER — Other Ambulatory Visit: Payer: Self-pay | Admitting: General Surgery

## 2021-02-04 ENCOUNTER — Other Ambulatory Visit: Payer: Self-pay | Admitting: Adult Health

## 2021-02-04 DIAGNOSIS — Z9889 Other specified postprocedural states: Secondary | ICD-10-CM

## 2021-02-04 NOTE — Progress Notes (Signed)
Surgical Instructions    Your procedure is scheduled on 02/11/21.  Report to [facility] Main Entrance "A" at 05:30 A.M., then check in with the Admitting office.  Call this number if you have problems the morning of surgery:  [phone]   If you have any questions prior to your surgery date call [phone]: Open Monday-Friday 8am-4pm    Remember:  Do not eat after midnight the night before your surgery  You may drink clear liquids until 04:30am the morning of your surgery.   Clear liquids allowed are: Water, Non-Citrus Juices (without pulp), Carbonated Beverages, Clear Tea, Black Coffee Only, and Gatorade    Take these medicines the morning of surgery with A SIP OF WATER  anastrozole (ARIMIDEX)  citalopram (CELEXA) cromolyn (OPTICROM) eye drops if needed gabapentin (NEURONTIN) if needed metoprolol succinate (TOPROL-XL)   As of today, STOP taking any Aspirin (unless otherwise instructed by your surgeon) Aleve, Naproxen, Ibuprofen, Motrin, Advil, Goody's, BC's, all herbal medications, fish oil, and all vitamins, diclofenac Sodium (VOLTAREN) gel and meloxicam (MOBIC).   WHAT DO I DO ABOUT MY DIABETES MEDICATION?   Marland Kitchen Do not take oral diabetes medicines (pills) the morning of surgery.      . THE MORNING OF SURGERY, take 7 units of insulin detemir (LEVEMIR). Do not take metFORMIN (GLUCOPHAGE).  . The day of surgery, do not take other diabetes injectables, including Byetta (exenatide), Bydureon (exenatide ER), Victoza (liraglutide), or Trulicity (dulaglutide).  . If your CBG is greater than 220 mg/dL, you may take  of your sliding scale (correction) dose of insulin.   HOW TO MANAGE YOUR DIABETES BEFORE AND AFTER SURGERY  Why is it important to control my blood sugar before and after surgery? . Improving blood sugar levels before and after surgery helps healing and can limit problems. . A way of improving blood sugar control is eating a healthy diet by: o  Eating less sugar  and carbohydrates o  Increasing activity/exercise o  Talking with your doctor about reaching your blood sugar goals . High blood sugars (greater than 180 mg/dL) can raise your risk of infections and slow your recovery, so you will need to focus on controlling your diabetes during the weeks before surgery. . Make sure that the doctor who takes care of your diabetes knows about your planned surgery including the date and location.  How do I manage my blood sugar before surgery? . Check your blood sugar at least 4 times a day, starting 2 days before surgery, to make sure that the level is not too high or low. . Check your blood sugar the morning of your surgery when you wake up and every 2 hours until you get to the Short Stay unit. o If your blood sugar is less than 70 mg/dL, you will need to treat for low blood sugar: - Do not take insulin. - Treat a low blood sugar (less than 70 mg/dL) with  cup of clear juice (cranberry or apple), 4 glucose tablets, OR glucose gel. - Recheck blood sugar in 15 minutes after treatment (to make sure it is greater than 70 mg/dL). If your blood sugar is not greater than 70 mg/dL on recheck, call [phone] for further instructions. . Report your blood sugar to the short stay nurse when you get to Short Stay.  . If you are admitted to the hospital after surgery: o Your blood sugar will be checked by the staff and you will probably be given insulin after surgery (instead of oral  diabetes medicines) to make sure you have good blood sugar levels. o The goal for blood sugar control after surgery is 80-180 mg/dL.                      Do not wear jewelry, make up, or nail polish            Do not wear lotions, powders, perfumes/colognes, or deodorant.            Do not shave 48 hours prior to surgery.  Men may shave face and neck.            Do not bring valuables to the hospital.            [facility] is not responsible for any belongings or valuables.  Do NOT  Smoke (Tobacco/Vaping) or drink Alcohol 24 hours prior to your procedure If you use a CPAP at night, you may bring all equipment for your overnight stay.   Contacts, glasses, dentures or bridgework may not be worn into surgery, please bring cases for these belongings   For patients admitted to the hospital, discharge time will be determined by your treatment team.   Patients discharged the day of surgery will not be allowed to drive home, and someone needs to stay with them for 24 hours.    Special instructions:   [facility]- Preparing For Surgery  Before surgery, you can play an important role. Because skin is not sterile, your skin needs to be as free of germs as possible. You can reduce the number of germs on your skin by washing with CHG (chlorahexidine gluconate) Soap before surgery.  CHG is an antiseptic cleaner which kills germs and bonds with the skin to continue killing germs even after washing.    Oral Hygiene is also important to reduce your risk of infection.  Remember - BRUSH YOUR TEETH THE MORNING OF SURGERY WITH YOUR REGULAR TOOTHPASTE  Please do not use if you have an allergy to CHG or antibacterial soaps. If your skin becomes reddened/irritated stop using the CHG.  Do not shave (including legs and underarms) for at least 48 hours prior to first CHG shower. It is OK to shave your face.  Please follow these instructions carefully.   1. Shower the NIGHT BEFORE SURGERY and the MORNING OF SURGERY  2. If you chose to wash your hair, wash your hair first as usual with your normal shampoo.  3. After you shampoo, rinse your hair and body thoroughly to remove the shampoo.  4. Wash Face and genitals (private parts) with your normal soap.   5.  Shower the NIGHT BEFORE SURGERY and the MORNING OF SURGERY with CHG Soap.   6. Use CHG Soap as you would any other liquid soap. You can apply CHG directly to the skin and wash gently with a scrungie or a clean washcloth.   7. Apply the  CHG Soap to your body ONLY FROM THE NECK DOWN.  Do not use on open wounds or open sores. Avoid contact with your eyes, ears, mouth and genitals (private parts). Wash Face and genitals (private parts)  with your normal soap.   8. Wash thoroughly, paying special attention to the area where your surgery will be performed.  9. Thoroughly rinse your body with warm water from the neck down.  10. DO NOT shower/wash with your normal soap after using and rinsing off the CHG Soap.  11. Pat yourself dry with a CLEAN TOWEL.  12. Wear CLEAN PAJAMAS to bed the night before surgery  13. Place CLEAN SHEETS on your bed the night before your surgery  14. DO NOT SLEEP WITH PETS.   Day of Surgery: Take a shower with CHG soap. Wear Clean/Comfortable clothing the morning of surgery Do not apply any deodorants/lotions.   Remember to brush your teeth WITH YOUR REGULAR TOOTHPASTE.   Please read over the following fact sheets that you were given.

## 2021-02-05 ENCOUNTER — Inpatient Hospital Stay (HOSPITAL_COMMUNITY)
Admission: RE | Admit: 2021-02-05 | Discharge: 2021-02-05 | Disposition: A | Discharge status: Home / Self Care | Payer: Medicare Other | Source: Ambulatory Visit

## 2021-02-05 NOTE — Progress Notes (Signed)
Called patient to inform her of surgery date and place change. She stated she was notified yesterday of change.

## 2021-02-06 ENCOUNTER — Encounter (HOSPITAL_COMMUNITY): Payer: Self-pay | Admitting: General Surgery

## 2021-02-06 ENCOUNTER — Other Ambulatory Visit: Payer: Self-pay

## 2021-02-06 NOTE — Progress Notes (Signed)
COVID Vaccine Completed: Yes  Date COVID Vaccine completed: x2 Has received booster: Yes COVID vaccine manufacturer:   Moderna    Date of COVID positive in last 90 days: No  PCP - Nolene Ebbs, MD Cardiologist - N/A Endocrinologist: Dr Chalmers Cater  Chest x-ray - N/A EKG - 10/14/20 in epic Stress Test - N/A ECHO - 03/25/20 in epic Cardiac Cath - N/A Pacemaker/ICD device last checked: N/A  Sleep Study - Yes CPAP - not currently using  Fasting Blood Sugar - 120's Checks Blood Sugar __2___ times a day   Blood Thinner Instructions: N/A Aspirin Instructions: N/A Last Dose: N/A  Activity level:  Able to exercise without symptoms     Anesthesia review: DM, HTN, OSA non compliant with CPAP  Patient denies shortness of breath, fever, cough and chest pain at PAT appointment   Patient verbalized understanding of instructions that were given to them at the PAT appointment. Patient was also instructed that they will need to review over the PAT instructions again at home before surgery.

## 2021-02-07 ENCOUNTER — Other Ambulatory Visit (HOSPITAL_COMMUNITY): Payer: Medicare Other

## 2021-02-10 ENCOUNTER — Other Ambulatory Visit (HOSPITAL_COMMUNITY)
Admission: RE | Admit: 2021-02-10 | Discharge: 2021-02-10 | Disposition: A | Discharge status: Home / Self Care | Payer: Medicare Other | Source: Ambulatory Visit | Attending: General Surgery | Admitting: General Surgery

## 2021-02-10 DIAGNOSIS — Z01812 Encounter for preprocedural laboratory examination: Secondary | ICD-10-CM | POA: Diagnosis present

## 2021-02-10 DIAGNOSIS — Z20822 Contact with and (suspected) exposure to covid-19: Secondary | ICD-10-CM | POA: Insufficient documentation

## 2021-02-10 LAB — SARS CORONAVIRUS 2 (TAT 6-24 HRS): SARS Coronavirus 2: NEGATIVE

## 2021-02-12 ENCOUNTER — Encounter (HOSPITAL_COMMUNITY)
Admission: RE | Disposition: A | Discharge status: Home / Self Care | Payer: Self-pay | Source: Home / Self Care | Attending: General Surgery

## 2021-02-12 ENCOUNTER — Ambulatory Visit (HOSPITAL_COMMUNITY): Payer: Medicare Other | Admitting: Physician Assistant

## 2021-02-12 ENCOUNTER — Ambulatory Visit (HOSPITAL_COMMUNITY): Payer: Medicare Other | Admitting: Anesthesiology

## 2021-02-12 ENCOUNTER — Encounter (HOSPITAL_COMMUNITY): Payer: Self-pay | Admitting: General Surgery

## 2021-02-12 ENCOUNTER — Ambulatory Visit (HOSPITAL_COMMUNITY)
Admission: RE | Admit: 2021-02-12 | Discharge: 2021-02-12 | Disposition: A | Discharge status: Home / Self Care | Payer: Medicare Other | Attending: General Surgery | Admitting: General Surgery

## 2021-02-12 DIAGNOSIS — Z90710 Acquired absence of both cervix and uterus: Secondary | ICD-10-CM | POA: Insufficient documentation

## 2021-02-12 DIAGNOSIS — Z79811 Long term (current) use of aromatase inhibitors: Secondary | ICD-10-CM | POA: Diagnosis not present

## 2021-02-12 DIAGNOSIS — Z794 Long term (current) use of insulin: Secondary | ICD-10-CM | POA: Diagnosis not present

## 2021-02-12 DIAGNOSIS — Z452 Encounter for adjustment and management of vascular access device: Secondary | ICD-10-CM | POA: Diagnosis not present

## 2021-02-12 DIAGNOSIS — Z8543 Personal history of malignant neoplasm of ovary: Secondary | ICD-10-CM | POA: Insufficient documentation

## 2021-02-12 DIAGNOSIS — Z79899 Other long term (current) drug therapy: Secondary | ICD-10-CM | POA: Diagnosis not present

## 2021-02-12 DIAGNOSIS — Z7982 Long term (current) use of aspirin: Secondary | ICD-10-CM | POA: Insufficient documentation

## 2021-02-12 DIAGNOSIS — Z90722 Acquired absence of ovaries, bilateral: Secondary | ICD-10-CM | POA: Diagnosis not present

## 2021-02-12 DIAGNOSIS — Z96651 Presence of right artificial knee joint: Secondary | ICD-10-CM | POA: Diagnosis not present

## 2021-02-12 DIAGNOSIS — Z8616 Personal history of COVID-19: Secondary | ICD-10-CM | POA: Diagnosis not present

## 2021-02-12 DIAGNOSIS — Z7984 Long term (current) use of oral hypoglycemic drugs: Secondary | ICD-10-CM | POA: Insufficient documentation

## 2021-02-12 DIAGNOSIS — Z9221 Personal history of antineoplastic chemotherapy: Secondary | ICD-10-CM | POA: Insufficient documentation

## 2021-02-12 DIAGNOSIS — Z791 Long term (current) use of non-steroidal anti-inflammatories (NSAID): Secondary | ICD-10-CM | POA: Insufficient documentation

## 2021-02-12 DIAGNOSIS — C50911 Malignant neoplasm of unspecified site of right female breast: Secondary | ICD-10-CM | POA: Insufficient documentation

## 2021-02-12 DIAGNOSIS — Z923 Personal history of irradiation: Secondary | ICD-10-CM | POA: Diagnosis not present

## 2021-02-12 HISTORY — DX: Malignant neoplasm of unspecified site of right female breast: C50.911

## 2021-02-12 HISTORY — PX: PORT-A-CATH REMOVAL: SHX5289

## 2021-02-12 HISTORY — DX: Polyneuropathy, unspecified: G62.9

## 2021-02-12 LAB — CBC
HCT: 38.4 % (ref 36.0–46.0)
Hemoglobin: 12.3 g/dL (ref 12.0–15.0)
MCH: 29.6 pg (ref 26.0–34.0)
MCHC: 32 g/dL (ref 30.0–36.0)
MCV: 92.5 fL (ref 80.0–100.0)
Platelets: 185 10*3/uL (ref 150–400)
RBC: 4.15 MIL/uL (ref 3.87–5.11)
RDW: 13 % (ref 11.5–15.5)
WBC: 7.6 10*3/uL (ref 4.0–10.5)
nRBC: 0 % (ref 0.0–0.2)

## 2021-02-12 LAB — BASIC METABOLIC PANEL
Anion gap: 7 (ref 5–15)
BUN: 16 mg/dL (ref 8–23)
CO2: 27 mmol/L (ref 22–32)
Calcium: 9 mg/dL (ref 8.9–10.3)
Chloride: 105 mmol/L (ref 98–111)
Creatinine, Ser: 0.71 mg/dL (ref 0.44–1.00)
GFR, Estimated: >60 mL/min (ref >60)
Glucose, Bld: 181 mg/dL — ABNORMAL HIGH (ref 70–99)
Potassium: 4.3 mmol/L (ref 3.5–5.1)
Sodium: 139 mmol/L (ref 135–145)

## 2021-02-12 LAB — HEMOGLOBIN A1C
Hgb A1c MFr Bld: 7.8 % — ABNORMAL HIGH (ref 4.8–5.6)
Mean Plasma Glucose: 177.16 mg/dL

## 2021-02-12 LAB — GLUCOSE, CAPILLARY
Glucose-Capillary: 164 mg/dL — ABNORMAL HIGH (ref 70–99)
Glucose-Capillary: 148 mg/dL — ABNORMAL HIGH (ref 70–99)

## 2021-02-12 SURGERY — REMOVAL PORT-A-CATH
Anesthesia: Monitor Anesthesia Care | Site: Chest | Laterality: Left

## 2021-02-12 MED ORDER — ACETAMINOPHEN 500 MG PO TABS
1000.0000 mg | ORAL_TABLET | ORAL | Status: AC
  Administered 2021-02-12: 1000 mg via ORAL
  Filled 2021-02-12: qty 2

## 2021-02-12 MED ORDER — DEXAMETHASONE SODIUM PHOSPHATE 10 MG/ML IJ SOLN
INTRAMUSCULAR | Status: AC
  Filled 2021-02-12: qty 1

## 2021-02-12 MED ORDER — LIDOCAINE 2% (20 MG/ML) 5 ML SYRINGE
INTRAMUSCULAR | Status: AC
  Filled 2021-02-12: qty 5

## 2021-02-12 MED ORDER — ONDANSETRON HCL 4 MG/2ML IJ SOLN
INTRAMUSCULAR | Status: DC | PRN
  Administered 2021-02-12: 4 mg via INTRAVENOUS

## 2021-02-12 MED ORDER — PROPOFOL 10 MG/ML IV BOLUS
INTRAVENOUS | Status: DC | PRN
  Administered 2021-02-12 (×3): 20 mg via INTRAVENOUS

## 2021-02-12 MED ORDER — LIDOCAINE HCL (PF) 1 % IJ SOLN
INTRAMUSCULAR | Status: DC | PRN
  Administered 2021-02-12: 10 mL

## 2021-02-12 MED ORDER — CHLORHEXIDINE GLUCONATE CLOTH 2 % EX PADS: 6.0000 | MEDICATED_PAD | Freq: Once | CUTANEOUS | Status: DC

## 2021-02-12 MED ORDER — ONDANSETRON HCL 4 MG/2ML IJ SOLN
INTRAMUSCULAR | Status: AC
  Filled 2021-02-12: qty 2

## 2021-02-12 MED ORDER — PROPOFOL 500 MG/50ML IV EMUL
INTRAVENOUS | Status: DC | PRN
  Administered 2021-02-12: 125 ug/kg/min via INTRAVENOUS

## 2021-02-12 MED ORDER — FENTANYL CITRATE (PF) 100 MCG/2ML IJ SOLN
INTRAMUSCULAR | Status: DC | PRN
  Administered 2021-02-12: 25 ug via INTRAVENOUS

## 2021-02-12 MED ORDER — FENTANYL CITRATE (PF) 100 MCG/2ML IJ SOLN
INTRAMUSCULAR | Status: AC
  Filled 2021-02-12: qty 2

## 2021-02-12 MED ORDER — LIDOCAINE HCL (PF) 1 % IJ SOLN
INTRAMUSCULAR | Status: AC
  Filled 2021-02-12: qty 30

## 2021-02-12 MED ORDER — LACTATED RINGERS IV SOLN: INTRAVENOUS | Status: DC

## 2021-02-12 MED ORDER — LIDOCAINE 2% (20 MG/ML) 5 ML SYRINGE
INTRAMUSCULAR | Status: DC | PRN
  Administered 2021-02-12: 100 mg via INTRAVENOUS

## 2021-02-12 MED ORDER — CEFAZOLIN SODIUM-DEXTROSE 2-4 GM/100ML-% IV SOLN
2.0000 g | INTRAVENOUS | Status: AC
  Administered 2021-02-12: 2 g via INTRAVENOUS
  Filled 2021-02-12: qty 100

## 2021-02-12 MED ORDER — BUPIVACAINE-EPINEPHRINE (PF) 0.25% -1:200000 IJ SOLN
INTRAMUSCULAR | Status: AC
  Filled 2021-02-12: qty 30

## 2021-02-12 MED ORDER — MIDAZOLAM HCL 2 MG/2ML IJ SOLN
INTRAMUSCULAR | Status: AC
  Filled 2021-02-12: qty 2

## 2021-02-12 MED ORDER — CHLORHEXIDINE GLUCONATE 0.12 % MT SOLN
15.0000 mL | Freq: Once | OROMUCOSAL | Status: AC
  Administered 2021-02-12: 15 mL via OROMUCOSAL

## 2021-02-12 MED ORDER — PROPOFOL 10 MG/ML IV BOLUS
INTRAVENOUS | Status: AC
  Filled 2021-02-12: qty 20

## 2021-02-12 MED ORDER — ORAL CARE MOUTH RINSE: 15.0000 mL | Freq: Once | OROMUCOSAL | Status: AC

## 2021-02-12 MED ORDER — BUPIVACAINE-EPINEPHRINE 0.25% -1:200000 IJ SOLN
INTRAMUSCULAR | Status: DC | PRN
  Administered 2021-02-12: 10 mL

## 2021-02-12 MED ORDER — OXYCODONE HCL 5 MG PO TABS: 5.0000 mg | ORAL_TABLET | Freq: Four times a day (QID) | ORAL | 0 refills | Status: DC | PRN

## 2021-02-12 MED ORDER — MIDAZOLAM HCL 5 MG/5ML IJ SOLN
INTRAMUSCULAR | Status: DC | PRN
  Administered 2021-02-12: 2 mg via INTRAVENOUS

## 2021-02-12 SURGICAL SUPPLY — 37 items
ADH SKN CLS APL DERMABOND .7 (GAUZE/BANDAGES/DRESSINGS) ×1
APL PRP STRL LF DISP 70% ISPRP (MISCELLANEOUS) ×1
APL SKNCLS STERI-STRIP NONHPOA (GAUZE/BANDAGES/DRESSINGS)
BENZOIN TINCTURE PRP APPL 2/3 (GAUZE/BANDAGES/DRESSINGS) ×1 IMPLANT
BLADE SURG 15 STRL LF DISP TIS (BLADE) ×1 IMPLANT
BLADE SURG 15 STRL SS (BLADE) ×2
CHLORAPREP W/TINT 26 (MISCELLANEOUS) ×2 IMPLANT
COVER SURGICAL LIGHT HANDLE (MISCELLANEOUS) ×2 IMPLANT
COVER WAND RF STERILE (DRAPES) IMPLANT
DECANTER SPIKE VIAL GLASS SM (MISCELLANEOUS) ×2 IMPLANT
DERMABOND ADVANCED (GAUZE/BANDAGES/DRESSINGS) ×1
DERMABOND ADVANCED .7 DNX12 (GAUZE/BANDAGES/DRESSINGS) IMPLANT
DRAPE LAPAROTOMY TRNSV 102X78 (DRAPES) ×1 IMPLANT
ELECT COATED BLADE 2.86 ST (ELECTRODE) ×1 IMPLANT
ELECT REM PT RETURN 15FT ADLT (MISCELLANEOUS) ×2 IMPLANT
GAUZE 4X4 16PLY RFD (DISPOSABLE) IMPLANT
GAUZE SPONGE 4X4 12PLY STRL (GAUZE/BANDAGES/DRESSINGS) ×2 IMPLANT
GLOVE SURG ENC MOIS LTX SZ6 (GLOVE) ×2 IMPLANT
GLOVE SURG UNDER LTX SZ6.5 (GLOVE) ×4 IMPLANT
GLOVE SURG UNDER POLY LF SZ6.5 (GLOVE) ×2 IMPLANT
GLOVE SURG UNDER POLY LF SZ7 (GLOVE) ×2 IMPLANT
GOWN STRL REUS W/TWL 2XL LVL3 (GOWN DISPOSABLE) ×2 IMPLANT
GOWN STRL REUS W/TWL XL LVL3 (GOWN DISPOSABLE) ×6 IMPLANT
KIT BASIN OR (CUSTOM PROCEDURE TRAY) ×2 IMPLANT
KIT TURNOVER KIT A (KITS) ×2 IMPLANT
MARKER SKIN DUAL TIP RULER LAB (MISCELLANEOUS) ×2 IMPLANT
NDL HYPO 25X1 1.5 SAFETY (NEEDLE) ×1 IMPLANT
NEEDLE HYPO 22GX1.5 SAFETY (NEEDLE) IMPLANT
NEEDLE HYPO 25X1 1.5 SAFETY (NEEDLE) ×2 IMPLANT
PACK BASIC VI WITH GOWN DISP (CUSTOM PROCEDURE TRAY) ×2 IMPLANT
PENCIL SMOKE EVACUATOR (MISCELLANEOUS) IMPLANT
STRIP CLOSURE SKIN 1/2X4 (GAUZE/BANDAGES/DRESSINGS) ×1 IMPLANT
SUT MNCRL AB 4-0 PS2 18 (SUTURE) ×2 IMPLANT
SUT VIC AB 3-0 SH 27 (SUTURE)
SUT VIC AB 3-0 SH 27X BRD (SUTURE) IMPLANT
SYR CONTROL 10ML LL (SYRINGE) ×2 IMPLANT
TOWEL OR 17X26 10 PK STRL BLUE (TOWEL DISPOSABLE) ×2 IMPLANT

## 2021-02-12 NOTE — Op Note (Signed)
  PRE-OPERATIVE DIAGNOSIS:  un-needed Port-A-Cath for right breast cancer  POST-OPERATIVE DIAGNOSIS:  Same   PROCEDURE:  Procedure(s):  REMOVAL PORT-A-CATH left subclavian  SURGEON:  Surgeon(s):  Stark Klein, MD  ANESTHESIA:   MAC + local  EBL:   Minimal  SPECIMEN:  None  Complications : none known  Procedure:   Pt was  identified in the holding area and taken to the operating room where she was placed supine on the operating room table.  MAC anesthesia was induced.  The left upper chest was prepped and draped.  The prior incision was anesthetized with local anesthetic.  The incision was opened with a #15 blade.  The subcutaneous tissue was divided with the cautery.  The port was identified and the capsule opened.  The four 2-0 prolene sutures were removed.  The port was then removed and pressure held on the tract.  The catheter appeared intact without evidence of breakage, length was 22 cm.  The wound was inspected for hemostasis, which was achieved with cautery.  The wound was closed with 3-0 vicryl deep dermal interrupted sutures and 4-0 Monocryl running subcuticular suture.  The wound was cleaned, dried, and dressed with dermabond.  The patient was awakened from anesthesia and taken to the PACU in stable condition.  Needle, sponge, and instrument counts are correct.

## 2021-02-12 NOTE — Discharge Instructions (Addendum)
[facility] Office Phone Number [phone]   POST OP INSTRUCTIONS  Always review your discharge instruction sheet given to you by the facility where your surgery was performed.  IF YOU HAVE DISABILITY OR FAMILY LEAVE FORMS, YOU MUST BRING THEM TO THE OFFICE FOR PROCESSING.  DO NOT GIVE THEM TO YOUR DOCTOR.  1. A prescription for pain medication may be given to you upon discharge.  Take your pain medication as prescribed, if needed.  If narcotic pain medicine is not needed, then you may take acetaminophen (Tylenol) or ibuprofen (Advil) as needed. 2. Take your usually prescribed medications unless otherwise directed 3. If you need a refill on your pain medication, please contact your pharmacy.  They will contact our office to request authorization.  Prescriptions will not be filled after 5pm or on week-ends. 4. You should eat very light the first 24 hours after surgery, such as soup, crackers, pudding, etc.  Resume your normal diet the day after surgery 5. It is common to experience some constipation if taking pain medication after surgery.  Increasing fluid intake and taking a stool softener will usually help or prevent this problem from occurring.  A mild laxative (Milk of Magnesia or Miralax) should be taken according to package directions if there are no bowel movements after 48 hours. 6. You may shower in 48 hours.  The surgical glue will flake off in 2-3 weeks.   7. ACTIVITIES:  No strenuous activity or heavy lifting for 1 week.   a. You may drive when you no longer are taking prescription pain medication, you can comfortably wear a seatbelt, and you can safely maneuver your car and apply brakes. b. RETURN TO WORK:  __________n/a_______________ Kathy Reese should see your doctor in the office for a follow-up appointment approximately three-four weeks after your surgery.    WHEN TO CALL YOUR DOCTOR: 1. Fever over 101.0 2. Nausea and/or vomiting. 3. Extreme swelling or  bruising. 4. Continued bleeding from incision. 5. Increased pain, redness, or drainage from the incision.  The clinic staff is available to answer your questions during regular business hours.  Please don't hesitate to call and ask to speak to one of the nurses for clinical concerns.  If you have a medical emergency, go to the nearest emergency room or call 911.  A surgeon from [facility] is always on call at the hospital.  For further questions, please visit [email or web address]    General Anesthesia, Adult, Care After This sheet gives you information about how to care for yourself after your procedure. Your health care provider may also give you more specific instructions. If you have problems or questions, contact your health care provider. What can I expect after the procedure? After the procedure, the following side effects are common:  Pain or discomfort at the IV site.  Nausea.  Vomiting.  Sore throat.  Trouble concentrating.  Feeling cold or chills.  Feeling weak or tired.  Sleepiness and fatigue.  Soreness and body aches. These side effects can affect parts of the body that were not involved in surgery. Follow these instructions at home: For the time period you were told by your health care provider:  Rest.  Do not participate in activities where you could fall or become injured.  Do not drive or use machinery.  Do not drink alcohol.  Do not take sleeping pills or medicines that cause drowsiness.  Do not make important decisions or sign legal documents.  Do not take care of children  on your own.   Eating and drinking  Follow any instructions from your health care provider about eating or drinking restrictions.  When you feel hungry, start by eating small amounts of foods that are soft and easy to digest (bland), such as toast. Gradually return to your regular diet.  Drink enough fluid to keep your urine pale yellow.  If you vomit,  rehydrate by drinking water, juice, or clear broth. General instructions  If you have sleep apnea, surgery and certain medicines can increase your risk for breathing problems. Follow instructions from your health care provider about wearing your sleep device: ? Anytime you are sleeping, including during daytime naps. ? While taking prescription pain medicines, sleeping medicines, or medicines that make you drowsy.  Have a responsible adult stay with you for the time you are told. It is important to have someone help care for you until you are awake and alert.  Return to your normal activities as told by your health care provider. Ask your health care provider what activities are safe for you.  Take over-the-counter and prescription medicines only as told by your health care provider.  If you smoke, do not smoke without supervision.  Keep all follow-up visits as told by your health care provider. This is important. Contact a health care provider if:  You have nausea or vomiting that does not get better with medicine.  You cannot eat or drink without vomiting.  You have pain that does not get better with medicine.  You are unable to pass urine.  You develop a skin rash.  You have a fever.  You have redness around your IV site that gets worse. Get help right away if:  You have difficulty breathing.  You have chest pain.  You have blood in your urine or stool, or you vomit blood. Summary  After the procedure, it is common to have a sore throat or nausea. It is also common to feel tired.  Have a responsible adult stay with you for the time you are told. It is important to have someone help care for you until you are awake and alert.  When you feel hungry, start by eating small amounts of foods that are soft and easy to digest (bland), such as toast. Gradually return to your regular diet.  Drink enough fluid to keep your urine pale yellow.  Return to your normal activities  as told by your health care provider. Ask your health care provider what activities are safe for you. This information is not intended to replace advice given to you by your health care provider. Make sure you discuss any questions you have with your health care provider. Document Revised: 06/06/2020 Document Reviewed: 01/04/2020 Elsevier Patient Education  2021 [employer].

## 2021-02-12 NOTE — Anesthesia Procedure Notes (Signed)
Date/Time: 02/12/2021 11:49 AM Performed by: Sharlette Dense, CRNA Oxygen Delivery Method: Simple face mask

## 2021-02-12 NOTE — Transfer of Care (Signed)
Immediate Anesthesia Transfer of Care Note  Patient: Virdell Tisdale Swindle  Procedure(s) Performed: REMOVAL PORT-A-CATH (Left Chest)  Patient Location: PACU  Anesthesia Type:MAC  Level of Consciousness: awake and alert   Airway & Oxygen Therapy: Patient Spontanous Breathing and Patient connected to face mask oxygen  Post-op Assessment: Report given to RN and Post -op Vital signs reviewed and stable  Post vital signs: Reviewed and stable  Last Vitals:  Vitals Value Taken Time  BP    Temp    Pulse 56 02/12/21 1225  Resp 17 02/12/21 1225  SpO2 100 % 02/12/21 1225  Vitals shown include unvalidated device data.  Last Pain:  Vitals:   02/12/21 0830  TempSrc: Oral  PainSc: 0-No pain      Patients Stated Pain Goal: 3 (50/53/97 6734)  Complications: No complications documented.

## 2021-02-12 NOTE — H&P (Signed)
Kathy Reese is an 70 y.o. female.   Chief Complaint: h/o right breast cancer HPI:  Pt is a 70 yo F s/p breast conservation for right breast cancer.  She also received adjuvant chemotherapy.  She is done and desires port removal.    Past Medical History:  Diagnosis Date  . Allergic rhinitis, cause unspecified   . Arthritis   . Breast cancer, right ([place])   . COVID-19 virus infection 10/2020  . Depression   . Diverticulosis   . History of Bell's palsy   . Hypersomnia   . Neuropathy    secondary to chemo  . OSA on CPAP    not using at this time 06/15/19  . Other and unspecified hyperlipidemia   . Personal history of chemotherapy   . Personal history of malignant neoplasm of ovary   . Personal history of radiation therapy   . Type II or unspecified type diabetes mellitus without mention of complication, not stated as uncontrolled   . Unspecified essential hypertension   . Vertigo     Past Surgical History:  Procedure Laterality Date  . ABDOMINAL ADHESION SURGERY  12/96   exc peritoneal cyst  . BREAST BIOPSY Left 2000   stereotactic, negative  . BREAST BIOPSY Right 05/08/2019  . BREAST LUMPECTOMY Right 05/30/2019  . BREAST LUMPECTOMY WITH RADIOACTIVE SEED AND SENTINEL LYMPH NODE BIOPSY Right 05/30/2019   Procedure: RIGHT BREAST LUMPECTOMY WITH BRACKETED RADIOACTIVE SEEDS  AND SENTINEL LYMPH NODE BIOPSY;  Surgeon: [place] Klein, MD;  Location: [facility];  Service: General;  Laterality: Right;  . BREAST REDUCTION SURGERY Bilateral 10/03  . COLONOSCOPY    . EYE SURGERY Left    cataracts  . LAPAROSCOPIC SALPINGO OOPHERECTOMY Right 12/96  . 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 LAPAROSCOPY ABDOMEN DIAGNOSTIC  2/08   w/LOA, LSO  . PORTACATH PLACEMENT Left 06/16/2019   Procedure: INSERTION PORT-A-CATH;  Surgeon: [place] Klein, MD;  Location: [facility];  Service: General;  Laterality: Left;  . REDUCTION MAMMAPLASTY Bilateral 2004  .  TONSILLECTOMY    . TOTAL ABDOMINAL HYSTERECTOMY W/ BILATERAL SALPINGOOPHORECTOMY  5/94   secondary to uterine fibroids  . TOTAL KNEE ARTHROPLASTY Right 05/12/2013   Procedure: TOTAL KNEE ARTHROPLASTY;  Surgeon: Alta Corning, MD;  Location: [facility];  Service: Orthopedics;  Laterality: Right;  . TRIGGER FINGER RELEASE  07/06/2012   Procedure: RELEASE TRIGGER FINGER/A-1 PULLEY;  Surgeon: Alta Corning, MD;  Location: [facility];  Service: Orthopedics;  Laterality: Left;  left ring finger  . TUBAL LIGATION      Family History  Problem Relation Age of Onset  . Lung cancer Mother   . Colon cancer Father   . Stroke Brother   . Stroke Sister   . Pulmonary Hypertension Sister    Social History:  reports that she has never smoked. She has never used smokeless tobacco. She reports current alcohol use of about 1.0 standard drink of alcohol per week. She reports that she does not use drugs.  Allergies: No Known Allergies  Medications Prior to Admission  Medication Sig Dispense Refill  . anastrozole (ARIMIDEX) 1 MG tablet Take 1 tablet (1 mg total) by mouth daily. 90 tablet 3  . aspirin EC 81 MG tablet Take 81 mg by mouth daily.    Marland Kitchen atorvastatin (LIPITOR) 40 MG tablet Take 40 mg by mouth daily at 6 PM.    . Calcium Carb-Cholecalciferol (CALCIUM  600+D3 PO) Take 2 tablets by mouth daily.    . cholecalciferol (VITAMIN D3) 25 MCG (1000 UT) tablet Take 1,000 Units by mouth daily.    . citalopram (CELEXA) 10 MG tablet Take 10 mg by mouth daily.    . Continuous Blood Gluc Sensor (FREESTYLE LIBRE 14 DAY SENSOR) MISC Apply topically every 14 (fourteen) days.    . cromolyn (OPTICROM) 4 % ophthalmic solution Place 1 drop into both eyes 4 (four) times daily as needed (allergies).    . diclofenac Sodium (VOLTAREN) 1 % GEL Apply 2 g topically 4 (four) times daily as needed (knee pain).    Marland Kitchen gabapentin (NEURONTIN) 100 MG capsule Take 100 mg by mouth daily as needed (pain).    . hydrochlorothiazide  (HYDRODIURIL) 25 MG tablet Take 1 tablet by mouth daily.    . insulin detemir (LEVEMIR) 100 UNIT/ML injection Inject 15 Units into the skin daily with breakfast.    . LORazepam (ATIVAN) 0.5 MG tablet TAKE 1 TABLET BY MOUTH AT BEDTIME AS NEEDED FOR SLEEP (Patient taking differently: Take 0.5 mg by mouth at bedtime as needed for sleep.) 30 tablet 0  . losartan (COZAAR) 100 MG tablet Take 1 tablet by mouth daily.    . meloxicam (MOBIC) 15 MG tablet Take 15 mg by mouth daily as needed for pain.    . metFORMIN (GLUCOPHAGE) 500 MG tablet Take 500 mg by mouth every morning.     . metoprolol succinate (TOPROL-XL) 100 MG 24 hr tablet Take 100 mg by mouth daily.    Marland Kitchen ACCU-CHEK AVIVA PLUS test strip     . Accu-Chek Softclix Lancets lancets       Results for orders placed or performed during the hospital encounter of 02/12/21 (from the past 48 hour(s))  CBC per protocol     Status: None   Collection Time: 02/12/21  8:17 AM  Result Value Ref Range   WBC 7.6 4.0 - 10.5 K/uL   RBC 4.15 3.87 - 5.11 MIL/uL   Hemoglobin 12.3 12.0 - 15.0 g/dL   HCT 38.4 36.0 - 46.0 %   MCV 92.5 80.0 - 100.0 fL   MCH 29.6 26.0 - 34.0 pg   MCHC 32.0 30.0 - 36.0 g/dL   RDW 13.0 11.5 - 15.5 %   Platelets 185 150 - 400 K/uL   nRBC 0.0 0.0 - 0.2 %    Comment: Performed at [facility], [place] [address], [place], [place] [address]  Basic metabolic panel per protocol     Status: Abnormal   Collection Time: 02/12/21  8:17 AM  Result Value Ref Range   Sodium 139 135 - 145 mmol/L   Potassium 4.3 3.5 - 5.1 mmol/L   Chloride 105 98 - 111 mmol/L   CO2 27 22 - 32 mmol/L   Glucose, Bld 181 (H) 70 - 99 mg/dL    Comment: Glucose reference range applies only to samples taken after fasting for at least 8 hours.   BUN 16 8 - 23 mg/dL   Creatinine, Ser 0.71 0.44 - 1.00 mg/dL   Calcium 9.0 8.9 - 10.3 mg/dL   GFR, Estimated >60 >60 mL/min    Comment: (NOTE) Calculated using the CKD-EPI Creatinine Equation (2021)     Anion gap 7 5 - 15    Comment: Performed at [facility], [place] [address] [place], [place] [address]  Hemoglobin A1c per protocol     Status: Abnormal   Collection Time: 02/12/21  8:17 AM  Result Value Ref Range   Hgb A1c MFr Bld 7.8 (H) 4.8 - 5.6 %    Comment: (NOTE) Pre diabetes:          5.7%-6.4%  Diabetes:              >6.4%  Glycemic control for   <7.0% adults with diabetes    Mean Plasma Glucose 177.16 mg/dL    Comment: Performed at [facility] [address] [place], [place] [address]   No results found.  Review of Systems  All other systems reviewed and are negative.   Blood pressure (!) 184/74, pulse 60, temperature 98.9 F (37.2 C), temperature source Oral, resp. rate 18, height 5\' 3"  (1.6 m), weight 95.3 kg, last menstrual period 02/02/1993, SpO2 99 %. Physical Exam Vitals reviewed.  Constitutional:      Appearance: Normal appearance.  HENT:     Head: Normocephalic and atraumatic.  Eyes:     General: No scleral icterus.    Extraocular Movements: Extraocular movements intact.     Pupils: Pupils are equal, round, and reactive to light.  Cardiovascular:     Rate and Rhythm: Normal rate and regular rhythm.     Pulses: Normal pulses.  Pulmonary:     Effort: Pulmonary effort is normal.     Comments: Left port in place Musculoskeletal:        General: Normal range of motion.     Cervical back: Neck supple.  Skin:    General: Skin is warm and dry.     Capillary Refill: Capillary refill takes 2 to 3 seconds.  Neurological:     General: No focal deficit present.     Mental Status: She is alert and oriented to person, place, and time.  Psychiatric:        Mood and Affect: Mood normal.      Assessment/Plan H/o right breast cancer Left port in place.  Plan port removal. Discussed surgery with patient.    Stark Klein, MD 02/12/2021, 11:30 AM

## 2021-02-12 NOTE — Anesthesia Postprocedure Evaluation (Signed)
Anesthesia Post Note  Patient: Kathy Reese Signer  Procedure(s) Performed: REMOVAL PORT-A-CATH (Left Chest)     Patient location during evaluation: PACU Anesthesia Type: MAC Level of consciousness: awake and alert Pain management: pain level controlled Vital Signs Assessment: post-procedure vital signs reviewed and stable Respiratory status: spontaneous breathing, nonlabored ventilation, respiratory function stable and patient connected to nasal cannula oxygen Cardiovascular status: stable and blood pressure returned to baseline Postop Assessment: no apparent nausea or vomiting Anesthetic complications: no   No complications documented.  Last Vitals:  Vitals:   02/12/21 1245 02/12/21 1304  BP: (!) 159/64 (!) 170/72  Pulse: (!) 58   Resp: 20 16  Temp:  36.6 C  SpO2: 95% 100%    Last Pain:  Vitals:   02/12/21 1304  TempSrc: Oral  PainSc: 0-No pain                 [name]

## 2021-02-12 NOTE — Anesthesia Preprocedure Evaluation (Addendum)
Anesthesia Evaluation  Patient identified by MRN, date of birth, ID band Patient awake    Reviewed: Allergy & Precautions, NPO status , Patient's Chart, lab work & pertinent test results  Airway Mallampati: II  TM Distance: >3 FB Neck ROM: Full    Dental  (+) Chipped, Dental Advisory Given,    Pulmonary sleep apnea ,    breath sounds clear to auscultation       Cardiovascular hypertension, Pt. on medications  Rhythm:Regular Rate:Normal     Neuro/Psych PSYCHIATRIC DISORDERS Depression    GI/Hepatic negative GI ROS, Neg liver ROS,   Endo/Other  diabetes, Type 2, Insulin Dependent, Oral Hypoglycemic Agents  Renal/GU      Musculoskeletal  (+) Arthritis ,   Abdominal Normal abdominal exam  (+)   Peds  Hematology   Anesthesia Other Findings   Reproductive/Obstetrics                            Anesthesia Physical Anesthesia Plan  ASA: II  Anesthesia Plan: General   Post-op Pain Management:    Induction: Intravenous  PONV Risk Score and Plan: 4 or greater and Ondansetron, Dexamethasone, Midazolam and Treatment may vary due to age or medical condition  Airway Management Planned: LMA  Additional Equipment: None  Intra-op Plan:   Post-operative Plan: Extubation in OR  Informed Consent: I have reviewed the patients History and Physical, chart, labs and discussed the procedure including the risks, benefits and alternatives for the proposed anesthesia with the patient or authorized representative who has indicated his/her understanding and acceptance.     Dental advisory given  Plan Discussed with: CRNA  Anesthesia Plan Comments:        Anesthesia Quick Evaluation

## 2021-02-13 ENCOUNTER — Encounter (HOSPITAL_COMMUNITY): Payer: Self-pay | Admitting: General Surgery

## 2021-03-31 ENCOUNTER — Other Ambulatory Visit: Payer: Self-pay

## 2021-03-31 ENCOUNTER — Encounter: Payer: Self-pay | Admitting: Hematology and Oncology

## 2021-03-31 ENCOUNTER — Ambulatory Visit
Admission: RE | Admit: 2021-03-31 | Discharge: 2021-03-31 | Disposition: A | Discharge status: Home / Self Care | Payer: Medicare Other | Source: Ambulatory Visit | Attending: Adult Health | Admitting: Adult Health

## 2021-03-31 DIAGNOSIS — Z9889 Other specified postprocedural states: Secondary | ICD-10-CM

## 2021-04-18 ENCOUNTER — Other Ambulatory Visit: Payer: Self-pay | Admitting: Internal Medicine

## 2021-04-19 LAB — CBC
HCT: 38.8 % (ref 35.0–45.0)
Hemoglobin: 12.4 g/dL (ref 11.7–15.5)
MCH: 29 pg (ref 27.0–33.0)
MCHC: 32 g/dL (ref 32.0–36.0)
MCV: 90.7 fL (ref 80.0–100.0)
MPV: 10.2 fL (ref 7.5–12.5)
Platelets: 234 10*3/uL (ref 140–400)
RBC: 4.28 10*6/uL (ref 3.80–5.10)
RDW: 13 % (ref 11.0–15.0)
WBC: 7 10*3/uL (ref 3.8–10.8)

## 2021-04-19 LAB — COMPLETE METABOLIC PANEL WITH GFR
AG Ratio: 1.5 (calc) (ref 1.0–2.5)
ALT: 18 U/L (ref 6–29)
AST: 18 U/L (ref 10–35)
Albumin: 4.2 g/dL (ref 3.6–5.1)
Alkaline phosphatase (APISO): 104 U/L (ref 37–153)
BUN: 18 mg/dL (ref 7–25)
CO2: 27 mmol/L (ref 20–32)
Calcium: 9.6 mg/dL (ref 8.6–10.4)
Chloride: 103 mmol/L (ref 98–110)
Creat: 0.8 mg/dL (ref 0.50–1.05)
Globulin: 2.8 g/dL (calc) (ref 1.9–3.7)
Glucose, Bld: 110 mg/dL — ABNORMAL HIGH (ref 65–99)
Potassium: 4.1 mmol/L (ref 3.5–5.3)
Sodium: 139 mmol/L (ref 135–146)
Total Bilirubin: 0.6 mg/dL (ref 0.2–1.2)
Total Protein: 7 g/dL (ref 6.1–8.1)
eGFR: 80 mL/min/{1.73_m2} (ref >=60)

## 2021-04-19 LAB — TSH: TSH: 1.6 mIU/L (ref 0.40–4.50)

## 2021-04-19 LAB — LIPID PANEL
Cholesterol: 162 mg/dL (ref <200)
HDL: 52 mg/dL (ref >=50)
LDL Cholesterol (Calc): 92 mg/dL (calc)
Non-HDL Cholesterol (Calc): 110 mg/dL (calc) (ref <130)
Total CHOL/HDL Ratio: 3.1 (calc) (ref <5.0)
Triglycerides: 87 mg/dL (ref <150)

## 2021-04-28 ENCOUNTER — Encounter: Payer: Self-pay | Admitting: Hematology and Oncology

## 2021-05-08 ENCOUNTER — Other Ambulatory Visit: Payer: Self-pay

## 2021-05-08 ENCOUNTER — Encounter: Payer: Self-pay | Admitting: Podiatry

## 2021-05-08 ENCOUNTER — Ambulatory Visit: Payer: Medicare Other | Admitting: Podiatry

## 2021-05-08 DIAGNOSIS — L089 Local infection of the skin and subcutaneous tissue, unspecified: Secondary | ICD-10-CM

## 2021-05-08 DIAGNOSIS — S90112A Contusion of left great toe without damage to nail, initial encounter: Secondary | ICD-10-CM | POA: Diagnosis not present

## 2021-05-08 DIAGNOSIS — S90822A Blister (nonthermal), left foot, initial encounter: Secondary | ICD-10-CM

## 2021-05-08 NOTE — Progress Notes (Signed)
Subjective:   Patient ID: Kathy Reese, female   DOB: 70 y.o.   MRN: [id number]   HPI Patient is developed a blister of the big toe left and is concerned about infection.  States that she does have diabetes and tries to be very careful and does not want to lose the toe neuro   ROS      Objective:  Physical Exam  Vascular status intact with patient's left hallux medial side showing a small proximal blister at the nail bed localized no proximal edema erythema drainage noted     Assessment:  Probable contusion left toe big with a small blister formation     Plan:  Explained condition do not worry that this will be a long-term issue and I applied sterile dressing.  If it were to turn red or start to drain patient will be seen back again if not it should be uneventful and he

## 2021-05-13 IMAGING — MG MM PLC BREAST LOC DEV 1ST LESION INC*R*
8 of 14 series · 8 of 14 positions shown · non-contrast
Comparison: Previous exam(s).

CLINICAL DATA: 67-year-old female presenting for bracket
localization of the right breast prior to lumpectomy.

EXAM:
MAMMOGRAPHIC GUIDED RADIOACTIVE SEED LOCALIZATION OF THE RIGHT
BREAST

[R CC (1 of 5)]
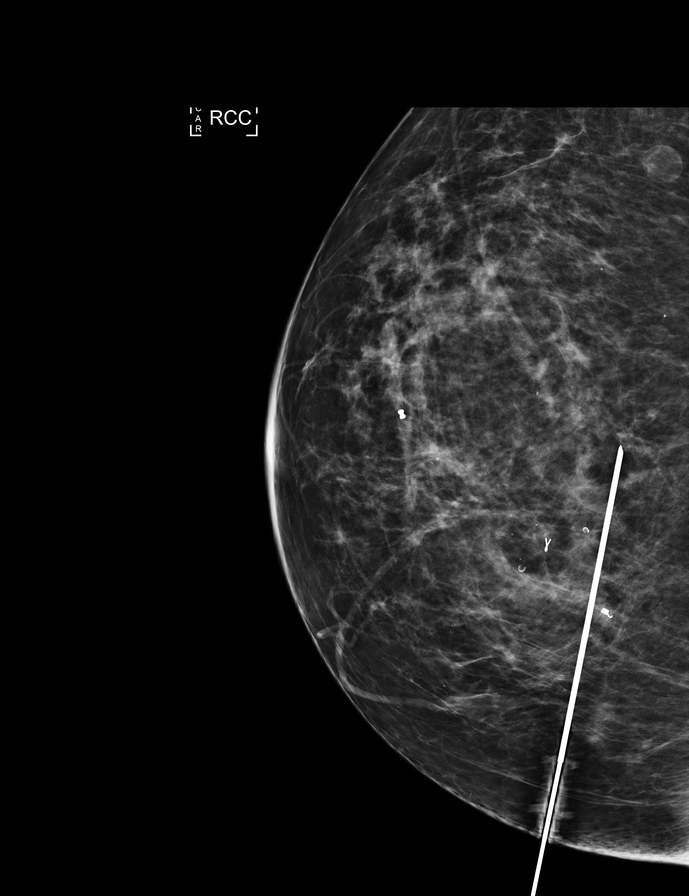

[R CC (2 of 5)]
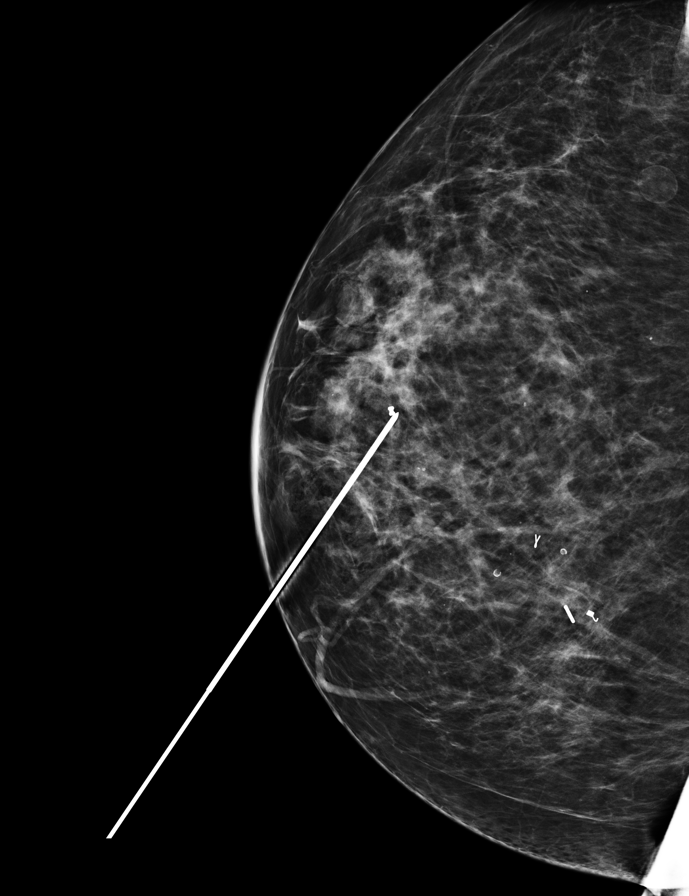

[R CC (3 of 5)]
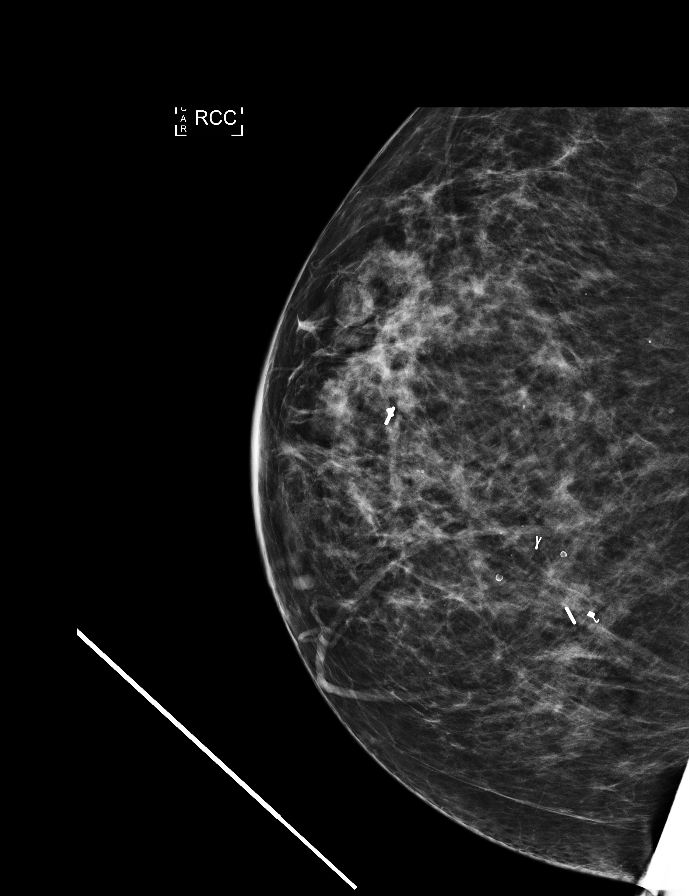

[R ML (1 of 3)]
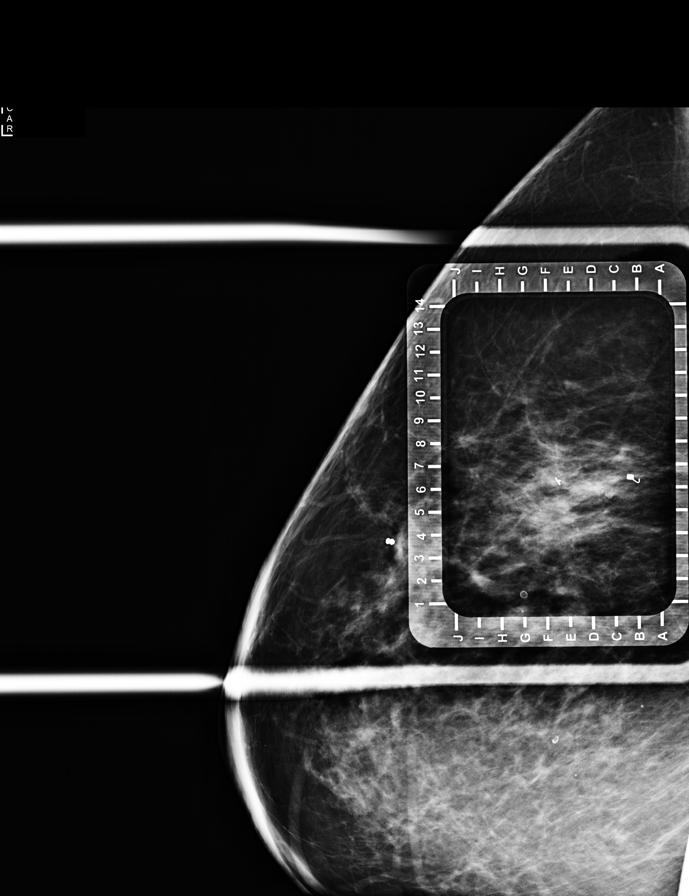

[R ML (2 of 3)]
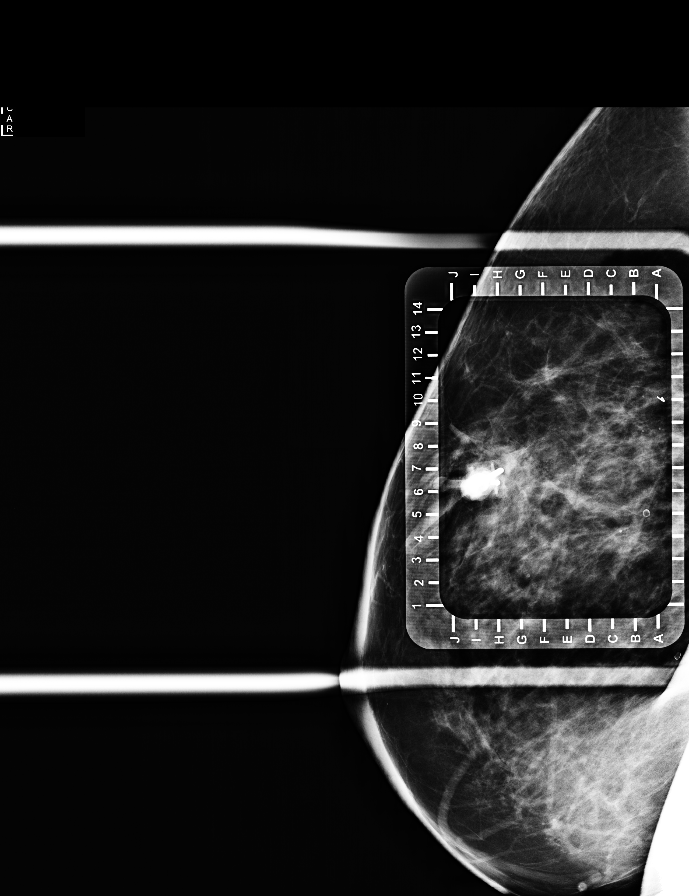

[R CC (4 of 5)]
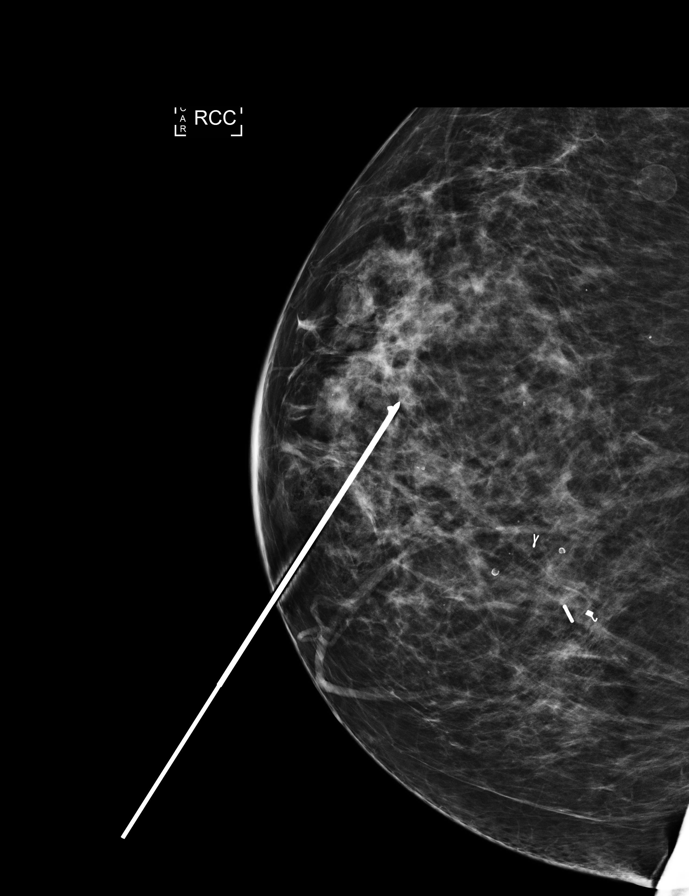

[R CC (5 of 5)]
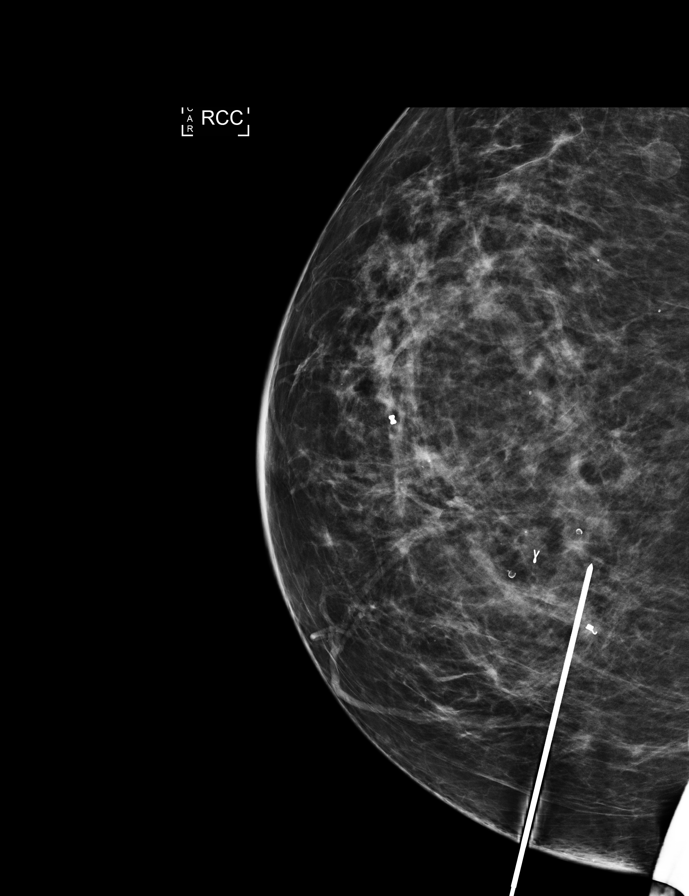

[R ML (3 of 3)]
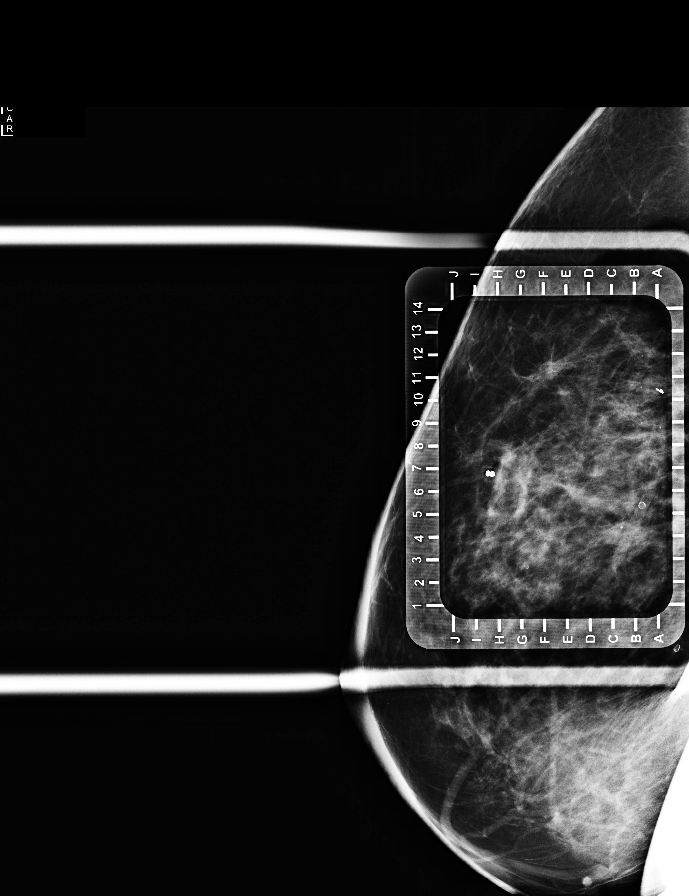

[8 of 14 positions shown; findings below may reference images not displayed]

FINDINGS: Patient presents for radioactive seed localization prior to right
breast lumpectomy. I met with the patient and we discussed the
procedure of seed localization including benefits and alternatives.
We discussed the high likelihood of a successful procedure. We
discussed the risks of the procedure including infection, bleeding,
tissue injury and further surgery. We discussed the low dose of
radioactivity involved in the procedure. Informed, written consent
was given.

The usual time-out protocol was performed immediately prior to the
procedure.

Using mammographic guidance, sterile technique, 1% lidocaine and an
6-631 radioactive seed, the posterior coil shaped biopsy marking
clip in the medial right breast was localized using a medial
approach. The follow-up mammogram images confirm the seed in the
expected location and were marked for Dr. Auntyjatty.

Follow-up survey of the patient confirms presence of the radioactive
seed.

Order number of 6-631 seed:  [id number].

Total activity:  0.250 millicuries reference Date: 05/12/2019

-----------------------------------------------

Using mammographic guidance, sterile technique, 1% lidocaine and an
6-631 radioactive seed, the anteriorly located dumbbell-shaped
biopsy marking clip was localized using a medial approach. The
follow-up mammogram images confirm the seed in the expected location
and were marked for Dr. Auntyjatty.

Follow-up survey of the patient confirms presence of the radioactive
seed.

Order number of 6-631 seed:  [id number].

Total activity:  0.250 millicuries reference Date: 05/12/2019

The patient tolerated the procedure well and was released from the
[REDACTED]. She was given instructions regarding seed removal.
IMPRESSION: Radioactive seed bracket localization of the medial right breast
breast. No apparent complications.

## 2021-05-30 IMAGING — DX DG CHEST 1V PORT
1 series · 1 of 1 positions shown · non-contrast
Comparison: 05/12/2013 and prior studies

CLINICAL DATA: Port-A-Cath placement.

EXAM:
PORTABLE CHEST 1 VIEW

[chest ap]
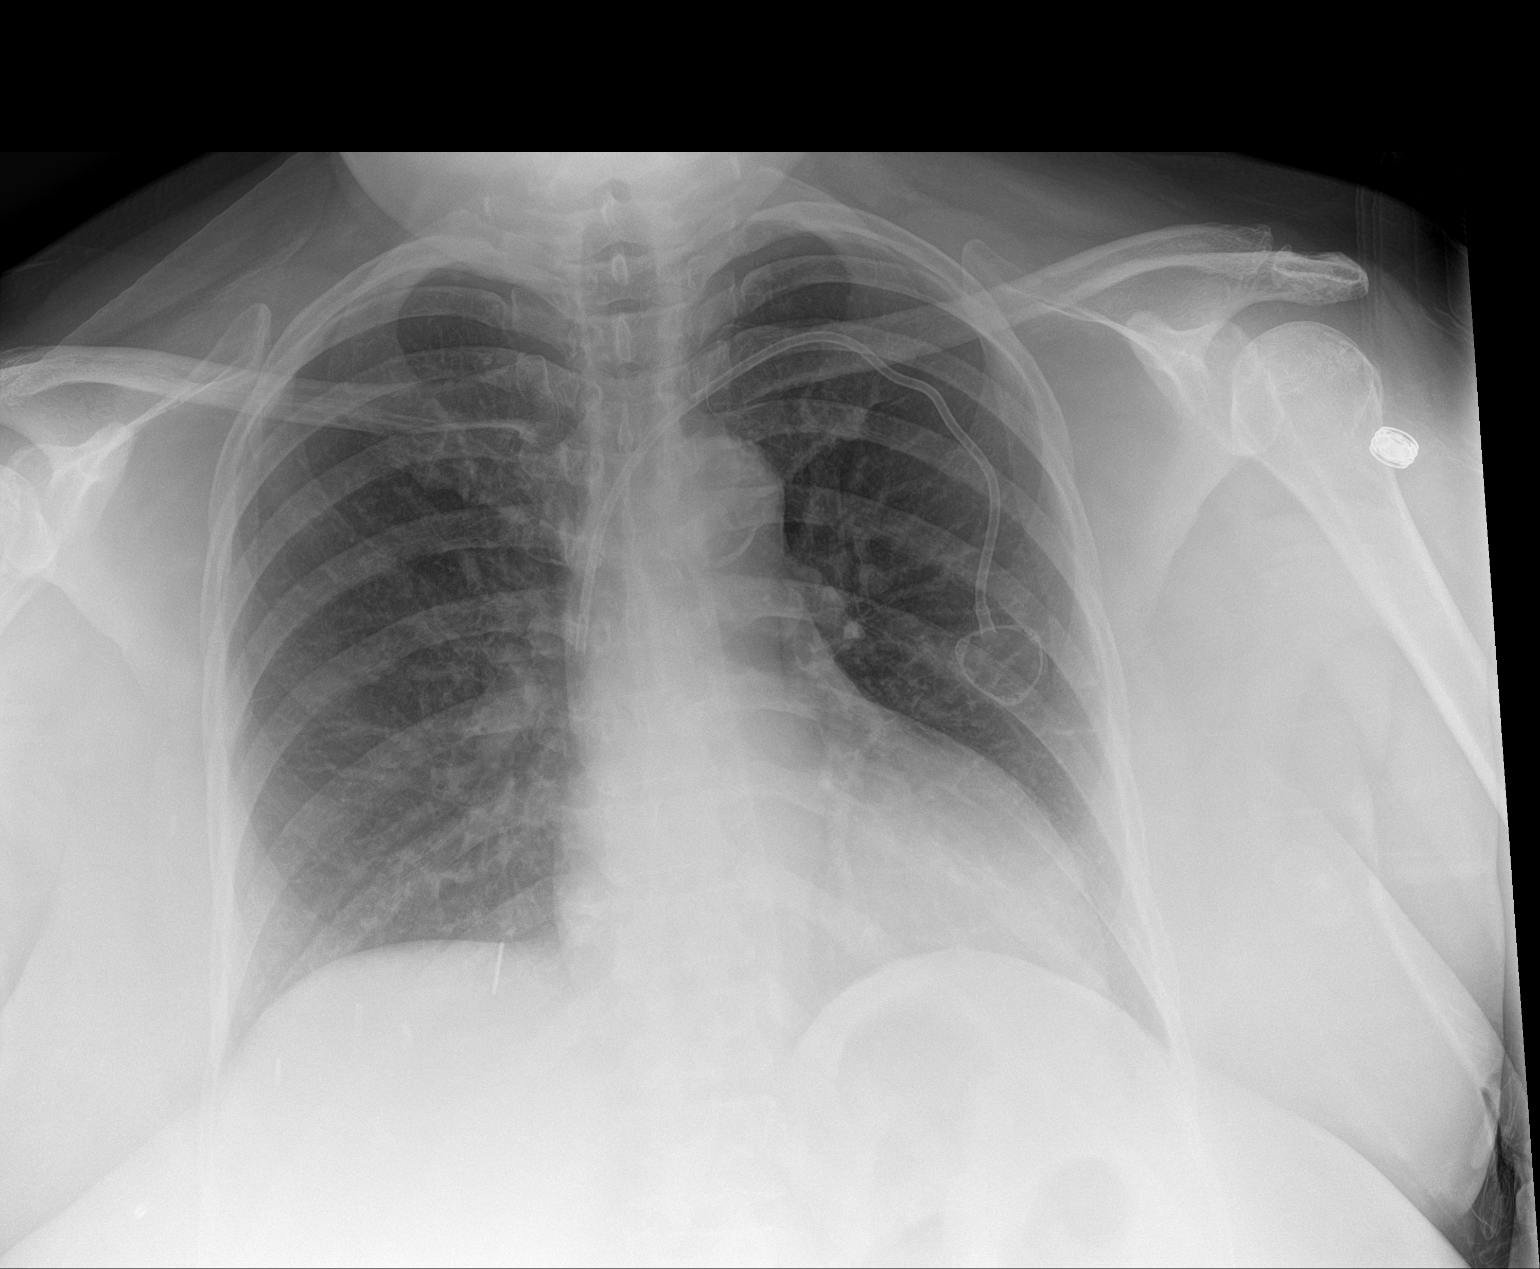

[1 of 1 positions shown; findings below may reference images not displayed]

FINDINGS: The cardiomediastinal silhouette is unremarkable.

A LEFT subclavian Port-A-Cath is present with tip overlying the mid
SVC.

There is no evidence of focal airspace disease, pulmonary edema,
suspicious pulmonary nodule/mass, pleural effusion, or pneumothorax.

No acute bony abnormalities are identified.
IMPRESSION: LEFT subclavian Port-A-Cath with tip overlying the mid SVC. No
pneumothorax.

## 2021-11-27 ENCOUNTER — Other Ambulatory Visit: Payer: Self-pay | Admitting: Hematology and Oncology

## 2021-11-28 ENCOUNTER — Encounter: Payer: Self-pay | Admitting: Hematology and Oncology

## 2021-12-01 NOTE — Progress Notes (Signed)
Patient Care Team: Nolene Ebbs, MD as PCP - General (Internal Medicine) Nicholas Lose, MD as Consulting Physician (Hematology and Oncology) Stark Klein, MD as Consulting Physician (General Surgery) Gery Pray, MD as Consulting Physician (Radiation Oncology) Nolene Ebbs, MD (Internal Medicine)  DIAGNOSIS:    ICD-10-CM   1. Malignant neoplasm of upper-inner quadrant of right breast in female, estrogen receptor positive ([place])  C50.211    Z17.0       SUMMARY OF ONCOLOGIC HISTORY: Oncology History  Malignant neoplasm of upper-inner quadrant of right breast in female, estrogen receptor positive ([place])  04/12/2019 Cancer Staging   Staging form: Breast, AJCC 8th Edition - Clinical stage from 04/12/2019: Stage IA (cT1b, cN0, cM0, G3, ER+, PR-, HER2+)    04/19/2019 Initial Diagnosis   Screening mammogram detected right breast asymmetry. Diagnostic mammogram and US showed 2 indeterminate right breast masses, 62m at 1 o'clock, 727mat 12 o'clock, with no axillary adenopathy. 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    05/30/2019 Surgery   Right lumpectomy (BBarry Dienes([id number] [phone] IDC with DCIS, grade 3, 0.6cm, clear margins, 3 axillary lymph nodes negative.    06/06/2019 Cancer Staging   Staging form: Breast, AJCC 8th Edition - Pathologic: Stage IA (pT1b, pN0, cM0, G3, ER+, PR-, HER2+)   06/27/2019 - 04/16/2020 Chemotherapy   PACLitaxel (TAXOL) 174 mg in sodium chloride 0.9 % 250 mL chemo infusion (</= 8047m2), 80 mg/m2 = 174 mg, Intravenous,  Once, 3 of 3 cycles. Dose modification: 65 mg/m2 (original dose 80 mg/m2, Cycle 3, Reason: Dose not tolerated). Administration: 174 mg (06/27/2019), 174 mg (07/04/2019), 174 mg (07/25/2019), 174 mg (07/11/2019), 174 mg (07/18/2019), 174 mg (08/01/2019), 174 mg (08/08/2019), 174 mg (08/15/2019), 138 mg (08/22/2019)  trastuzumab-anns (KANJINTI) 399 mg in sodium chloride 0.9 % 250 mL chemo infusion, 4 mg/kg = 399 mg (100 %  of original dose 4 mg/kg), Intravenous,  Once, 14 of 17 cycles. Dose modification: 4 mg/kg (original dose 4 mg/kg, Cycle 1, Reason: Other (see comments), Comment: Biosimilar Conversion; [facility] mg/kg (original dose 2 mg/kg, Cycle 3, Reason: Provider Judgment), 6 mg/kg (original dose 2 mg/kg, Cycle 4, Reason: Other (see comments), Comment: switch to maintenance q3 weeks), 600 mg (original dose 2 mg/kg, Cycle 4, Reason: Other (see comments), Comment: maint q3 weeks), 525 mg (original dose 2 mg/kg, Cycle 11, Reason: Other (see comments), Comment: Weight loss). Administration: 399 mg (06/27/2019), 189 mg (07/04/2019), 189 mg (07/11/2019), 189 mg (07/18/2019), 189 mg (07/25/2019), 189 mg (08/01/2019), 189 mg (08/08/2019), 189 mg (08/15/2019), 189 mg (08/22/2019), 600 mg (08/29/2019), 600 mg (09/19/2019), 600 mg (10/10/2019), 600 mg (10/31/2019), 600 mg (11/21/2019), 600 mg (12/12/2019), 600 mg (01/02/2020), 525 mg (01/23/2020), 525 mg (02/13/2020), 600 mg (03/05/2020), 600 mg (03/26/2020), 600 mg (04/16/2020)   09/25/2019 - 10/27/2019 Radiation Therapy   The patient initially received a dose of 40.05 Gy in 15 fractions to the breast using whole-breast tangent fields. This was delivered using a 3-D conformal technique. The pt received a boost delivering an additional 10 Gy in 5 fractions using a electron boost with [id number]. The total dose was 50.05 Gy.   11/2019 - 11/2026 Anti-estrogen oral therapy   Anastrozole     CHIEF COMPLIANT: Follow-up of breast cancer on anastrozole   INTERVAL HISTORY: Kathy Reese 70 y106. with above-mentioned history of breast cancer who underwent a lumpectomy, adjuvant chemotherapy, radiation, Herceptin maintenance, and is currently on antiestrogen therapy with anastrozole. Mammogram on  03/31/2021 showed no evidence of malignancy. She presents to the clinic follow-up.   ALLERGIES:  has No Known Allergies.  MEDICATIONS:  Current Outpatient Medications  Medication Sig  Dispense Refill   ACCU-CHEK AVIVA PLUS test strip      Accu-Chek Softclix Lancets lancets      anastrozole (ARIMIDEX) 1 MG tablet TAKE 1 TABLET BY MOUTH  DAILY 90 tablet 3   aspirin EC 81 MG tablet Take 81 mg by mouth daily.     atorvastatin (LIPITOR) 40 MG tablet Take 40 mg by mouth daily at 6 PM.     Calcium Carb-Cholecalciferol (CALCIUM 600+D3 PO) Take 2 tablets by mouth daily.     cholecalciferol (VITAMIN D3) 25 MCG (1000 UT) tablet Take 1,000 Units by mouth daily.     citalopram (CELEXA) 10 MG tablet Take 10 mg by mouth daily.     Continuous Blood Gluc Sensor (FREESTYLE LIBRE 14 DAY SENSOR) MISC Apply topically every 14 (fourteen) days.     cromolyn (OPTICROM) 4 % ophthalmic solution Place 1 drop into both eyes 4 (four) times daily as needed (allergies).     diclofenac Sodium (VOLTAREN) 1 % GEL Apply 2 g topically 4 (four) times daily as needed (knee pain).     gabapentin (NEURONTIN) 100 MG capsule Take 100 mg by mouth daily as needed (pain).     hydrochlorothiazide (HYDRODIURIL) 25 MG tablet Take 1 tablet by mouth daily.     insulin detemir (LEVEMIR) 100 UNIT/ML injection Inject 15 Units into the skin daily with breakfast.     LORazepam (ATIVAN) 0.5 MG tablet TAKE 1 TABLET BY MOUTH AT BEDTIME AS NEEDED FOR SLEEP (Patient taking differently: Take 0.5 mg by mouth at bedtime as needed for sleep.) 30 tablet 0   losartan (COZAAR) 100 MG tablet Take 1 tablet by mouth daily.     meloxicam (MOBIC) 15 MG tablet Take 15 mg by mouth daily as needed for pain.     metFORMIN (GLUCOPHAGE) 500 MG tablet Take 500 mg by mouth every morning.      metoprolol succinate (TOPROL-XL) 100 MG 24 hr tablet Take 100 mg by mouth daily.     oxyCODONE (OXY IR/ROXICODONE) 5 MG immediate release tablet Take 1 tablet (5 mg total) by mouth every 6 (six) hours as needed for severe pain. 8 tablet 0   No current facility-administered medications for this visit.    PHYSICAL EXAMINATION: ECOG PERFORMANCE STATUS: 1 -  Symptomatic but completely ambulatory  Vitals:   12/02/21 0828  BP: (!) 178/56  Pulse: 65  Resp: 18  Temp: (!) 97.5 F (36.4 C)  SpO2: 100%   Filed Weights   12/02/21 0828  Weight: 214 lb 3.2 oz (97.2 kg)    BREAST: No palpable masses or nodules in either right or left breasts. No palpable axillary supraclavicular or infraclavicular adenopathy no breast tenderness or nipple discharge. (exam performed in the presence of a chaperone)  LABORATORY DATA:  I have reviewed the data as listed CMP Latest Ref Rng & Units 04/18/2021 02/12/2021 10/14/2020  Glucose 65 - 99 mg/dL 110(H) 181(H) 149(H)  BUN 7 - 25 mg/dL 18 16 19   Creatinine 0.50 - 1.05 mg/dL 0.80 0.71 0.78  Sodium 135 - 146 mmol/L 139 139 135  Potassium 3.5 - 5.3 mmol/L 4.1 4.3 4.7  Chloride 98 - 110 mmol/L 103 105 100  CO2 20 - 32 mmol/L 27 27 27   Calcium 8.6 - 10.4 mg/dL 9.6 9.0 9.2  Total Protein 6.1 - 8.1  g/dL 7.0 - -  Total Bilirubin 0.2 - 1.2 mg/dL 0.6 - -  Alkaline Phos 38 - 126 U/L - - -  AST 10 - 35 U/L 18 - -  ALT 6 - 29 U/L 18 - -    Lab Results  Component Value Date   WBC 7.0 04/18/2021   HGB 12.4 04/18/2021   HCT 38.8 04/18/2021   MCV 90.7 04/18/2021   PLT 234 04/18/2021   NEUTROABS 4.1 05/07/2020    ASSESSMENT & PLAN:  Malignant neoplasm of upper-inner quadrant of right breast in female, estrogen receptor positive ([place]) 04/19/2019:Screening mammogram detected right breast asymmetry. Diagnostic mammogram and US showed 2 indeterminate right breast masses, 41m at 1 o'clock, 720mat 12 o'clock, with no axillary adenopathy. 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  Stage Ia   05/30/2019: [facility]pectomy:[facility], grade 3, 0.6cm, clear margins, 3 axillary lymph nodes negative.  HER-2 positive (3+), ER+ (80%), PR -, Ki67 40%.  Stage Ia   Treatment plan: 1. Adjuvant chemo with Taxol Herceptin x9 cycles (stopped for neuropathy)  06/27/2019-08/22/2019, Herceptin completed 06/18/2020 2. Adjuvant radiation therapy 09/26/2019- 10/27/19 3. Adjuvant antiestrogen therapy with anastrozole started 11/21/2019 ------------------------------------------------------------------------------------------------------------------------------------------------------- Current treatment:  Herceptin maintenance therapy (to be completed September 2021) with anastrozole 12/21/2019: Echocardiogram EF 60 to 65%     Chemo-induced peripheral neuropathy: Rates it at 5 out of 10 I recommended participation in the phase 2 ACCRU [place] 2102 study of N-Palmitoylrthanolamide vs placebo for the treatment of chemo induced peripheral neuropathy.   Breast cancer surveillance:  Mammogram  03/31/2021: Benign breast density category B Bone density: 03/29/2020: T score 0: Normal Breast exam 12/02/2021: Benign  She takes care of her grand daughters who are twins and age 45 68ear old. She gets plenty of exercise. I discussed with her about losing a few pounds which will also help her diabetic management.  Return to clinic in 1 year for follow-up    No orders of the defined types were placed in this encounter.  The patient has a good understanding of the overall plan. she agrees with it. she will call with any problems that may develop before the next visit here.  Total time spent: 30 mins including face to face time and time spent for planning, charting and coordination of care  ViRulon EisenmengerMD, MPH 12/02/2021  I, KiThana Atesam acting as scribe for Dr. ViNicholas Lose I have reviewed the above documentation for accuracy and completeness, and I agree with the above.

## 2021-12-02 ENCOUNTER — Encounter: Payer: Self-pay | Admitting: *Deleted

## 2021-12-02 ENCOUNTER — Other Ambulatory Visit: Payer: Self-pay

## 2021-12-02 ENCOUNTER — Inpatient Hospital Stay: Payer: Medicare Other | Attending: Hematology and Oncology | Admitting: Hematology and Oncology

## 2021-12-02 DIAGNOSIS — G620 Drug-induced polyneuropathy: Secondary | ICD-10-CM | POA: Diagnosis not present

## 2021-12-02 DIAGNOSIS — C50211 Malignant neoplasm of upper-inner quadrant of right female breast: Secondary | ICD-10-CM | POA: Diagnosis not present

## 2021-12-02 DIAGNOSIS — Z9221 Personal history of antineoplastic chemotherapy: Secondary | ICD-10-CM | POA: Insufficient documentation

## 2021-12-02 DIAGNOSIS — Z170 Estrogen receptor positive status [ER+]: Secondary | ICD-10-CM | POA: Diagnosis not present

## 2021-12-02 DIAGNOSIS — Z79899 Other long term (current) drug therapy: Secondary | ICD-10-CM | POA: Diagnosis not present

## 2021-12-02 DIAGNOSIS — Z923 Personal history of irradiation: Secondary | ICD-10-CM | POA: Diagnosis not present

## 2021-12-02 DIAGNOSIS — T451X5A Adverse effect of antineoplastic and immunosuppressive drugs, initial encounter: Secondary | ICD-10-CM | POA: Insufficient documentation

## 2021-12-02 NOTE — Assessment & Plan Note (Signed)
04/19/2019:Screening mammogram detected right breast asymmetry. Diagnostic mammogram and US showed 2 indeterminate right breast masses, 44m at 1 o'clock, 774mat 12 o'clock, with no axillary adenopathy. 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 Stage Ia  05/30/2019: [facility]pect[facility] DC[facility], grade 3, 0.6cm, clear margins, 3 axillary lymph nodes negative.HER-2 positive (3+), ER+ (80%), PR -, Ki67 40%. Stage Ia  Treatment plan: 1.Adjuvant chemo with Taxol Herceptinx9 cycles(stopped for neuropathy)06/27/2019-08/22/2019, Herceptin completed 06/18/2020 2. Adjuvant radiation therapy12/22/2020- 10/27/19 3. Adjuvant antiestrogen therapywith anastrozolestarted2/16/2021 ------------------------------------------------------------------------------------------------------------------------------------------------------- Current treatment:Herceptin maintenance therapy(to be completed September 2021)with anastrozole 12/21/2019: Echocardiogram EF 60 to 65%  Herceptintoxicities:No further issues with diarrhea  Chemo-induced peripheral neuropathy:grade 1, improving Chemotherapy-induced anemia:Hemoglobin is 11.5previously  Breast cancer surveillance: Mammogram 03/31/2021: Benign breast density category B Bone density: 03/29/2020: T score 0: Normal Breast exam 12/02/2021: Benign  Return to clinic in 1 year for follow-up

## 2021-12-02 NOTE — Research (Signed)
Trial:  ACCRU-[place]-2102 - TREATMENT OF ESTABLISHED CHEMOTHERAPY-INDUCED NEUROPATHY WITH N-PALMITOYLETHANOLAMIDE, A CANNABIMIMETIC NUTRACEUTICAL: A RANDOMIZED DOUBLE-BLIND PHASE II PILOT TRIAL  Patient Kathy Reese was identified by Dr. Lindi Adie as a potential candidate for the above listed study.  This Clinical Research Nurse met with Kathy Reese, [id number], on 12/02/21 in a manner and location that ensures patient privacy to discuss participation in the above listed research study.  Patient is Unaccompanied.  A copy of the informed consent document with embedded HIPAA language was provided to the patient.  Patient reads, speaks, and understands [place].   Patient was provided with the business card of this Nurse and encouraged to contact the research team with any questions.  Approximately 10 minutes were spent with the patient reviewing the informed consent documents.  Patient was provided the option of taking informed consent documents home to review and was encouraged to review at their convenience with their support network, including other care providers. Patient took the consent documents home to review. Patient reports neuropathy symptoms in her hands and feet for a few years since she had chemotherapy.  She states it "really bothers me" when asked about severity on a 1-10 scale. She has tried gabapentin but it did not help and she is currently not using any other medications to treat neuropathy.Patient wants to discuss this study with her daughter and states okay for research nurse to follow up with her in a few weeks.  [name], BSN, RN, CCRP Clinical Research Nurse II 12/02/2021 9:39 AM

## 2021-12-03 ENCOUNTER — Telehealth: Payer: Self-pay | Admitting: Hematology and Oncology

## 2021-12-03 NOTE — Telephone Encounter (Signed)
Scheduled appointment per 2/28 los. Patient is aware. ?

## 2021-12-16 ENCOUNTER — Encounter: Payer: Self-pay | Admitting: Hematology and Oncology

## 2021-12-24 ENCOUNTER — Telehealth: Payer: Self-pay | Admitting: *Deleted

## 2021-12-24 NOTE — Telephone Encounter (Signed)
ACCRU-[place]-2102 - TREATMENT OF ESTABLISHED CHEMOTHERAPY-INDUCED NEUROPATHY WITH N-PALMITOYLETHANOLAMIDE, A CANNABIMIMETIC NUTRACEUTICAL: A RANDOMIZED DOUBLE-BLIND PHASE II PILOT TRIAL  ?Called patient to follow up on her interest in this study and see if she has any questions. Patient states she has not had a chance to read the consent form but she is interested. This research nurse reviewed the study including the purpose, potential risks and benefits and required assessments.  ?Eligibility criteria also reviewed with patient including prohibited medications and cannabis use. Patient denies using any medication for her neuropathy.  She does not take any opioid pain medication or use any cannabis products. When asked how much of a problem has numbness, tingling, or pain been in the past week patient answered 7 on 1-10 scale. She states she did not have any neuropathy symptoms prior to starting chemotherapy for her breast cancer. Informed patient it is required that MD visit is completed within 28 days of enrollment.  Patient would need to enroll by next week 3/28 at the latest in order to not have to repeat MD visit (recent visit was on 12/02/21).  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  Patient asked for research nurse to call her back later today to schedule.  ?[name], BSN, RN, CCRP ?Clinical Research Nurse II ?12/24/2021 11:09 AM ? ?

## 2021-12-24 NOTE — Telephone Encounter (Signed)
Called patient back to schedule research consent visit. Patient agreed to come into clinic on Monday 12/29/21 at 9 am to meet with research nurse to review/sign consent for this study.  Asked patient to contact research nurse if any questions before Monday. She verbalized understanding.  ?[name], BSN, RN, CCRP ?Clinical Research Nurse II ?12/24/2021 4:25 PM ? ?

## 2021-12-29 ENCOUNTER — Inpatient Hospital Stay: Payer: Medicare Other | Attending: Hematology and Oncology | Admitting: *Deleted

## 2021-12-29 ENCOUNTER — Other Ambulatory Visit: Payer: Self-pay

## 2021-12-29 DIAGNOSIS — G620 Drug-induced polyneuropathy: Secondary | ICD-10-CM

## 2021-12-29 NOTE — Research (Signed)
Trial:  ACCRU-[place]-2102 - TREATMENT OF ESTABLISHED CHEMOTHERAPY-INDUCED NEUROPATHY WITH N-PALMITOYLETHANOLAMIDE, A CANNABIMIMETIC NUTRACEUTICAL: A RANDOMIZED DOUBLE-BLIND PHASE II PILOT TRIAL ? ?Patient Kathy Reese was identified by Dr. Lindi Adie as a potential candidate for the above listed study.  This Clinical Research Nurse met with Orma Cheetham, [id number], on 12/29/21 in a manner and location that ensures patient privacy to discuss participation in the above listed research study.  Patient is Unaccompanied.  A copy of the informed consent document with embedded HIPAA language was provided to the patient.  Patient reads, speaks, and understands [place].   ?Patient was previously provided with informed consent documents.  Patient stated she has not read all of the consent form so document was reviewed page by page today.  ? ?As outlined in the informed consent form, this Nurse and Amonda Samyia Motter discussed the purpose of the research study, the investigational nature of the study, study procedures and requirements for study participation, potential risks and benefits of study participation, as well as alternatives to participation.  This study is blinded or double-blinded. The patient understands participation is voluntary and they may withdraw from study participation at any time.  Each study arm was reviewed, and randomization discussed.  Potential side effects were reviewed with patient as outlined in the consent form, and patient made aware there may be side effects not yet known. The chance of receiving placebo was discussed. Patient understands enrollment is pending full eligibility review.  ? ?Confidentiality and how the patient's information will be used as part of study participation were discussed.  Patient was informed there is not reimbursement provided for their time and effort spent on trial participation.  The patient is encouraged to discuss research study participation with their  insurance provider to determine what costs they may incur as part of study participation, including research related injury.   ? ?Patient had questions related to the potential side effects of the study drug.  She was especially concerned about the chance of the study drug causing her cancer to return. Informed patient this drug is not known to cause cancer or a recurrence of a previous cancer.  The consent form does state "It is unknown whether PEA may cause your previous cancer to reoccur."   Patient stated she would like to make an appointment to discuss this further with Dr. Lindi Adie.  Dr. Lindi Adie agreed and asked for appointment to be made to discuss with patient. He did not have any openings in his schedule to meet with patient today.  Patient verbalized understanding that an appointment will be made for patient to return and discuss with Dr. Lindi Adie.  Encouraged patient to review the consent form again with her family and support network. Thanked patient for her time and gave her my business card along with another copy of the ICF encouraging her to call with any questions. Informed patient we can meet again if she decides to proceed with the study after speaking with Dr. Lindi Adie. Patient verbalized understanding.  ? ?Approximately 30 minutes were spent with the patient reviewing the informed consent documents and discussing this study.  ? ?[name], BSN, RN, CCRP ?Clinical Research Nurse II ?12/29/2021 9:46 AM ? ?

## 2021-12-30 ENCOUNTER — Telehealth: Payer: Self-pay | Admitting: Hematology and Oncology

## 2021-12-30 NOTE — Telephone Encounter (Signed)
.  Called patient to schedule appointment per 3/27 [place], patient is aware of date and time.   ?

## 2022-01-02 NOTE — Progress Notes (Signed)
? ?Patient Care Team: ?Nolene Ebbs, MD as PCP - General (Internal Medicine) ?Nicholas Lose, MD as Consulting Physician (Hematology and Oncology) ?Stark Klein, MD as Consulting Physician (General Surgery) ?Gery Pray, MD as Consulting Physician (Radiation Oncology) ?Nolene Ebbs, MD (Internal Medicine) ? ?DIAGNOSIS:  ?Encounter Diagnosis  ?Name Primary?  ? Malignant neoplasm of upper-inner quadrant of right breast in female, estrogen receptor positive ([place])   ? ? ?SUMMARY OF ONCOLOGIC HISTORY: ?Oncology History  ?Malignant neoplasm of upper-inner quadrant of right breast in female, estrogen receptor positive ([place])  ?04/12/2019 Cancer Staging  ? Staging form: Breast, AJCC 8th Edition ?- Clinical stage from 04/12/2019: Stage IA (cT1b, cN0, cM0, G3, ER+, PR-, HER2+)  ?  ?04/19/2019 Initial Diagnosis  ? Screening mammogram detected right breast asymmetry. Diagnostic mammogram and US showed 2 indeterminate right breast masses, 82m at 1 o'clock, 714mat 12 o'clock, with no axillary adenopathy. 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  ?  ?05/30/2019 Surgery  ? Right lumpectomy ([facility]([phone] IDC with DCIS, grade 3, 0.6cm, clear margins, 3 axillary lymph nodes negative.  ?  ?06/06/2019 Cancer Staging  ? Staging form: Breast, AJCC 8th Edition ?- Pathologic: Stage IA (pT1b, pN0, cM0, G3, ER+, PR-, HER2+) ?  ?06/27/2019 - 04/16/2020 Chemotherapy  ? PACLitaxel (TAXOL) 174 mg in sodium chloride 0.9 % 250 mL chemo infusion (</= 8049m2), 80 mg/m2 = 174 mg, Intravenous,  Once, 3 of 3 cycles. Dose modification: 65 mg/m2 (original dose 80 mg/m2, Cycle 3, Reason: Dose not tolerated). Administration: 174 mg (06/27/2019), 174 mg (07/04/2019), 174 mg (07/25/2019), 174 mg (07/11/2019), 174 mg (07/18/2019), 174 mg (08/01/2019), 174 mg (08/08/2019), 174 mg (08/15/2019), 138 mg (08/22/2019) ? ?trastuzumab-anns (KANJINTI) 399 mg in sodium chloride 0.9 % 250 mL chemo infusion, 4 mg/kg = 399 mg (100 %  of original dose 4 mg/kg), Intravenous,  Once, 14 of 17 cycles. Dose modification: 4 mg/kg (original dose 4 mg/kg, Cycle 1, Reason: Other (see comments), Comment: Biosimilar Conversion; [facility] mg/kg (original dose 2 mg/kg, Cycle 3, Reason: Provider Judgment), 6 mg/kg (original dose 2 mg/kg, Cycle 4, Reason: Other (see comments), Comment: switch to maintenance q3 weeks), 600 mg (original dose 2 mg/kg, Cycle 4, Reason: Other (see comments), Comment: maint q3 weeks), 525 mg (original dose 2 mg/kg, Cycle 11, Reason: Other (see comments), Comment: Weight loss). Administration: 399 mg (06/27/2019), 189 mg (07/04/2019), 189 mg (07/11/2019), 189 mg (07/18/2019), 189 mg (07/25/2019), 189 mg (08/01/2019), 189 mg (08/08/2019), 189 mg (08/15/2019), 189 mg (08/22/2019), 600 mg (08/29/2019), 600 mg (09/19/2019), 600 mg (10/10/2019), 600 mg (10/31/2019), 600 mg (11/21/2019), 600 mg (12/12/2019), 600 mg (01/02/2020), 525 mg (01/23/2020), 525 mg (02/13/2020), 600 mg (03/05/2020), 600 mg (03/26/2020), 600 mg (04/16/2020) ?  ?09/25/2019 - 10/27/2019 Radiation Therapy  ? The patient initially received a dose of 40.05 Gy in 15 fractions to the breast using whole-breast tangent fields. This was delivered using a 3-D conformal technique. The pt received a boost delivering an additional 10 Gy in 5 fractions using a electron boost with 9m70mlectrons. The total dose was 50.05 Gy. ?  ?11/2019 - 11/2026 Anti-estrogen oral therapy  ? Anastrozole ?  ? ? ?CHIEF COMPLIANT:  Follow-up of breast cancer on anastrozole  ?  ? ?INTERVAL HISTORY: Avaley TisdPaityn Balsama  70 y20. with above-mentioned history of breast cancer who underwent a lumpectomy, adjuvant chemotherapy, radiation, Herceptin maintenance, and is currently on antiestrogen therapy with anastrozole.  She has chemo-induced peripheral  neuropathy and today she is signing the consent form and had several questions and she wanted to make sure that the study drug is safe for her.  The neuropathy is  limiting her ability to open jars and other household items. ? ? ?ALLERGIES:  has No Known Allergies. ? ?MEDICATIONS:  ?Current Outpatient Medications  ?Medication Sig Dispense Refill  ? ACCU-CHEK AVIVA PLUS test strip     ? Accu-Chek Softclix Lancets lancets     ? anastrozole (ARIMIDEX) 1 MG tablet TAKE 1 TABLET BY MOUTH  DAILY 90 tablet 3  ? aspirin EC 81 MG tablet Take 81 mg by mouth daily.    ? atorvastatin (LIPITOR) 40 MG tablet Take 40 mg by mouth daily at 6 PM.    ? Calcium Carb-Cholecalciferol (CALCIUM 600+D3 PO) Take 2 tablets by mouth daily.    ? cholecalciferol (VITAMIN D3) 25 MCG (1000 UT) tablet Take 1,000 Units by mouth daily.    ? citalopram (CELEXA) 10 MG tablet Take 10 mg by mouth daily.    ? Continuous Blood Gluc Sensor (FREESTYLE LIBRE 14 DAY SENSOR) MISC Apply topically every 14 (fourteen) days.    ? cromolyn (OPTICROM) 4 % ophthalmic solution Place 1 drop into both eyes 4 (four) times daily as needed (allergies).    ? diclofenac Sodium (VOLTAREN) 1 % GEL Apply 2 g topically 4 (four) times daily as needed (knee pain).    ? hydrochlorothiazide (HYDRODIURIL) 25 MG tablet Take 1 tablet by mouth daily.    ? insulin detemir (LEVEMIR) 100 UNIT/ML injection Inject 15 Units into the skin daily with breakfast.    ? losartan (COZAAR) 100 MG tablet Take 1 tablet by mouth daily.    ? metFORMIN (GLUCOPHAGE) 500 MG tablet Take 500 mg by mouth every morning.     ? metoprolol succinate (TOPROL-XL) 100 MG 24 hr tablet Take 100 mg by mouth daily.    ? ?No current facility-administered medications for this visit.  ? ? ?PHYSICAL EXAMINATION: ?ECOG PERFORMANCE STATUS: 1 - Symptomatic but completely ambulatory ? ?Vitals:  ? 01/05/22 1109  ?BP: (!) 184/76  ?Pulse: (!) 57  ?Resp: 18  ?Temp: 97.8 ?F (36.6 ?C)  ?SpO2: 99%  ? ?Filed Weights  ? 01/05/22 1109  ?Weight: 210 lb 1.6 oz (95.3 kg)  ?  ? ?LABORATORY DATA:  ?I have reviewed the data as listed ? ?  Latest Ref Rng & Units 04/18/2021  ?  4:58 PM 02/12/2021  ?  8:17 AM  10/14/2020  ? 12:34 PM  ?CMP  ?Glucose 65 - 99 mg/dL 110   181   149    ?BUN 7 - 25 mg/dL 18   16   19     ?Creatinine 0.50 - 1.05 mg/dL 0.80   0.71   0.78    ?Sodium 135 - 146 mmol/L 139   139   135    ?Potassium 3.5 - 5.3 mmol/L 4.1   4.3   4.7    ?Chloride 98 - 110 mmol/L 103   105   100    ?CO2 20 - 32 mmol/L 27   27   27     ?Calcium 8.6 - 10.4 mg/dL 9.6   9.0   9.2    ?Total Protein 6.1 - 8.1 g/dL 7.0      ?Total Bilirubin 0.2 - 1.2 mg/dL 0.6      ?AST 10 - 35 U/L 18      ?ALT 6 - 29 U/L 18      ? ? ?  Lab Results  ?Component Value Date  ? WBC 7.0 04/18/2021  ? HGB 12.4 04/18/2021  ? HCT 38.8 04/18/2021  ? MCV 90.7 04/18/2021  ? PLT 234 04/18/2021  ? NEUTROABS 4.1 05/07/2020  ? ? ?ASSESSMENT & PLAN:  ?Malignant neoplasm of upper-inner quadrant of right breast in female, estrogen receptor positive ([place]) ?04/19/2019:Screening mammogram detected right breast asymmetry. Diagnostic mammogram and US showed 2 indeterminate right breast masses, 70m at 1 o'clock, 775mat 12 o'clock, with no axillary adenopathy. 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  ?Stage Ia ?  ?05/30/2019: [facility], grade 3, 0.6cm, clear margins, 3 axillary lymph nodes negative.  HER-2 positive (3+), ER+ (80%), PR -, Ki67 40%.  ?Stage Ia ?  ?Treatment plan: ?1. Adjuvant chemo with Taxol Herceptin x9 cycles (stopped for neuropathy) 06/27/2019-08/22/2019, Herceptin completed 06/18/2020 ?2. Adjuvant radiation therapy 09/26/2019- 10/27/19 ?3. Adjuvant antiestrogen therapy with anastrozole started 11/21/2019 ?------------------------------------------------------------------------------------------------------------------------------------------------------- ?Current treatment: anastrozole ?12/21/2019: Echocardiogram EF 60 to 65% ?    ?Chemo-induced peripheral neuropathy: Rates it at 5 out of 10 ?I recommended participation in the phase 2 ACCRU [place] 2102 study of  N-Palmitoylrthanolamide vs placebo for the treatment of chemo induced peripheral neuropathy. ?  ?Breast cancer surveillance:  ?Mammogram  03/31/2021: Benign breast density category B ?Bone density: 03/29/2020: T score 0: Nor

## 2022-01-05 ENCOUNTER — Other Ambulatory Visit: Payer: Self-pay

## 2022-01-05 ENCOUNTER — Encounter: Payer: Self-pay | Admitting: *Deleted

## 2022-01-05 ENCOUNTER — Inpatient Hospital Stay: Payer: Medicare Other | Attending: Hematology and Oncology | Admitting: Hematology and Oncology

## 2022-01-05 DIAGNOSIS — Z170 Estrogen receptor positive status [ER+]: Secondary | ICD-10-CM | POA: Insufficient documentation

## 2022-01-05 DIAGNOSIS — Z9221 Personal history of antineoplastic chemotherapy: Secondary | ICD-10-CM | POA: Insufficient documentation

## 2022-01-05 DIAGNOSIS — Z79811 Long term (current) use of aromatase inhibitors: Secondary | ICD-10-CM | POA: Diagnosis not present

## 2022-01-05 DIAGNOSIS — Z79899 Other long term (current) drug therapy: Secondary | ICD-10-CM | POA: Diagnosis not present

## 2022-01-05 DIAGNOSIS — T451X5A Adverse effect of antineoplastic and immunosuppressive drugs, initial encounter: Secondary | ICD-10-CM | POA: Insufficient documentation

## 2022-01-05 DIAGNOSIS — C50211 Malignant neoplasm of upper-inner quadrant of right female breast: Secondary | ICD-10-CM | POA: Insufficient documentation

## 2022-01-05 DIAGNOSIS — Z923 Personal history of irradiation: Secondary | ICD-10-CM | POA: Insufficient documentation

## 2022-01-05 DIAGNOSIS — G620 Drug-induced polyneuropathy: Secondary | ICD-10-CM | POA: Diagnosis not present

## 2022-01-05 NOTE — Assessment & Plan Note (Signed)
04/19/2019:Screening mammogram detected right breast asymmetry. Diagnostic mammogram and US showed 2 indeterminate right breast masses, 11m at 1 o'clock, 731mat 12 o'clock, with no axillary adenopathy. 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 ?Stage Ia ?? ?05/30/2019: [facility], grade 3, 0.6cm, clear margins, 3 axillary lymph nodes negative.??HER-2 positive (3+), ER+ (80%), PR -, Ki67 40%.? ?Stage Ia ?? ?Treatment plan: ?1.?Adjuvant chemo with Taxol Herceptin?x9 cycles?(stopped for neuropathy)?06/27/2019-08/22/2019, Herceptin completed 06/18/2020 ?2. Adjuvant radiation therapy?09/26/2019- 10/27/19 ?3. Adjuvant antiestrogen therapy?with anastrozole?started?11/21/2019 ?------------------------------------------------------------------------------------------------------------------------------------------------------- ?Current treatment: anastrozole ?12/21/2019: Echocardiogram EF 60 to 65% ?? ? ?Chemo-induced peripheral neuropathy:?Rates it at 5 out of 10 ?I recommended participation in the phase 2 ACCRU [place] 2102 study of N-Palmitoylrthanolamide vs placebo for the treatment of chemo induced peripheral neuropathy. ?? ?Breast cancer surveillance:? ?Mammogram ?03/31/2021: Benign breast density category B ?Bone density: 03/29/2020: T score 0: Normal ?Breast exam 12/02/2021: Benign ?? ?She takes care of her grand daughters who are twins and age 10 64ear old. ?She gets plenty of exercise. ?I discussed with her about losing a few pounds which will also help her diabetic management. ?? ?Return to clinic in?1 year?for follow-up ?

## 2022-01-05 NOTE — Research (Signed)
Trial Name:  ACCRU-[place]-2102 - TREATMENT OF ESTABLISHED CHEMOTHERAPY-INDUCED NEUROPATHY WITH N-PALMITOYLETHANOLAMIDE, A CANNABIMIMETIC NUTRACEUTICAL: A RANDOMIZED DOUBLE-BLIND PHASE II PILOT TRIAL  ? ?Patient Kathy Reese was identified by Dr. Lindi Adie as a potential candidate for the above listed study.  This Clinical Research Nurse met with Kathy Reese, [id number] on 01/05/22 in a manner and location that ensures patient privacy to discuss participation in the above listed research study.  Patient is Unaccompanied.  Patient was previously provided with informed consent documents.  Patient confirmed they have read the informed consent documents. ? ?Patient met with Dr. Lindi Adie today to discuss the study further with him before consenting. Patient stated after meeting with Dr. Lindi Adie she feels comfortable enrolling on the study and would like to consent. She reports she read the consent form again since our last meeting and she does not have any questions.  ? ?As outlined in the informed consent form, this Nurse and Kathy Reese discussed the purpose of the research study, the investigational nature of the study, study procedures and requirements for study participation, potential risks and benefits of study participation, as well as alternatives to participation.  This study is blinded or double-blinded. The patient understands participation is voluntary and they may withdraw from study participation at any time.  Each study arm was reviewed, and randomization discussed.  Potential side effects were reviewed with patient as outlined in the consent form, and patient made aware there may be side effects not yet known. The chance of receiving placebo was discussed. Patient understands enrollment is pending full eligibility review.  ? ?Confidentiality and how the patient's information will be used as part of study participation were discussed.  Patient was informed there is not reimbursement provided  for their time and effort spent on trial participation.  The patient is encouraged to discuss research study participation with their insurance provider to determine what costs they may incur as part of study participation, including research related injury.   ? ?All questions were answered to patient's satisfaction.  The informed consent with embedded HIPAA language was reviewed page by page.  The patient's mental and emotional status is appropriate to provide informed consent, and the patient verbalizes an understanding of study participation.  Patient has agreed to participate in the above listed research study and has voluntarily signed the informed consent and embedded HIPPA version IRB Approved 22 Nov 2020, Revised 06 Jan 2021 on 01/05/22 at 11:55 AM.  The patient was provided with a copy of the signed informed consent form with embedded HIPAA language for their reference.  No study specific procedures were obtained prior to the signing of the informed consent document.  Approximately 15 minutes were spent with the patient reviewing the informed consent documents.  Patient was not requested to complete a Release of Information form.  ? ? Eligibility Review:  Patient was asked how much of a problem has numbness, tingling or pain in your fingers and/or toes been in the past week. Patient reported a score of 8.  Patient verbally confirms that they can swallow oral medications. Patient Denies concurrent use of a cannabis product [facility] and/or CBD). Patient was instructed to avoid use of cannabis products while participating in the study. Patient Denies current or previous use of PEA. Patient Denies current use (or planning to use) opioids, duloxetine, gabapentin or pregabalin. Patient was advised that concurrent use of cannabis products, PEA, opioids, duloxetine, gabapentin, or pregabalin would make them ineligible for participation and patient does verbalize understanding. ? ?  LABS:  A pregnancy test is not required  for this patient. ? ?LIFE EXPECTANCY: The patient's provider has confirmed that their life expectancy is greater than 6 months. ?  ?PLAN: Patient was provided baseline questionnaire booklet and instructed to not complete it until research nurse calls patient. This is to be done the same day as registration.  Research nurse plans to register patient to the study tomorrow 01/06/22 or Wednesday 01/07/22 pending final and 2nd eligibility review.   ?Patient states she can return to clinic this Thursday 01/08/22 at 10 am to get the study drug.  Informed patient research nurse will plan to meet patient at this time and will collect the baseline questionnaire and dispense the study drug along with the weekly questionnaires. Patient verbalized understanding.  ? ?The patient was thanked for their time and continued voluntary participation in this study. Patient has been provided direct contact information and is encouraged to contact this Nurse for any needs or questions. ? ?[name], BSN, RN, CCRP ?Clinical Research Nurse II ?01/05/2022  ? ? ? ?

## 2022-01-06 ENCOUNTER — Encounter: Payer: Self-pay | Admitting: *Deleted

## 2022-01-06 ENCOUNTER — Encounter: Payer: Self-pay | Admitting: Hematology and Oncology

## 2022-01-06 DIAGNOSIS — C50211 Malignant neoplasm of upper-inner quadrant of right female breast: Secondary | ICD-10-CM

## 2022-01-06 NOTE — Research (Signed)
[facility]CRU-[place]-2102 - TREATMENT OF ESTABLISHED CHEMOTHERAPY-INDUCED NEUROPATHY WITH N-PALMITOYLETHANOLAMIDE, A CANNABIMIMETIC NUTRACEUTICAL: A RANDOMIZED DOUBLE-BLIND PHASE II PILOT TRIAL  ? ?ELIGIBILITY:This Nurse has reviewed this patient's inclusion and exclusion criteria and confirmed Kathy Reese is eligible for study participation.  Patient will continue with enrollment. ?Eligibility confirmed by treating investigator, who also agrees that patient should proceed with enrollment.  ? ?Menopausal status: Kathy Reese is post menopausal.  ? ?Baseline PROs: Informed patient that research nurse plans to register patient to the study today.  Asked her to complete the baseline questionnaires sometime today and bring the completed questionnaires with her on Thursday when she comes to pick up the study drug. Patient verbalized understanding and agreement.  ? ?REGISTRATION: Patient was registered to the above listed study and assigned study PID #[id number] Randomization is required for this study. Randomization information to follow.  ? ?RANDOMIZATION: Stratification criteria were confirmed by this clinical research Nurse and clinical research Nurse  Larina Bras verified the stratification factors used for randomization (female, taxane + cardoplatin + other agents). As part of the assigned treatment arm, patient will receive study agent 2 time(s) daily. MD will be notified of patient twice daily dosing assignment; treatment will begin 01/08/2022.  Per study protocol, patient will not be notified of their treatment assignment. Patient is successfully enrolled in the above study. ? ?DRUG ORDERING: Pharmacy contact Raul Del was notified of enrollment and need for study drug ordering by unblinded pharmacy staff.  ? ?PLAN: Research nurse will meet patient out front of [facility] on Thursday 01/08/22 at 10 am to collect baseline questionnaires and deliver the study drug along with weekly questionnaires. Patient  knows to call if any questions or changes before this appointment.  ?  ? ?[name], BSN, RN, CCRP ?Clinical Research Nurse II ?01/06/2022 4:20 PM ? ? ? ?

## 2022-01-06 NOTE — Research (Signed)
[facility]CRU-[place]-2102 - TREATMENT OF ESTABLISHED CHEMOTHERAPY-INDUCED NEUROPATHY WITH N-PALMITOYLETHANOLAMIDE, A CANNABIMIMETIC NUTRACEUTICAL: A RANDOMIZED DOUBLE-BLIND PHASE II PILOT TRIAL ? ?This Nurse has reviewed this patient's inclusion and exclusion criteria as a second review and confirms Kathy Reese is eligible for study participation.  Patient may continue with enrollment. ? ?Marjie Skiff. Heath Badon, RN, BSN, CHPN ?She  [facility]  [facility] ?Clinical Research Nurse ?[place] ?Direct Dial [phone]  Pager [phone] ?01/06/2022 11:33 AM ?

## 2022-01-08 ENCOUNTER — Encounter: Payer: Self-pay | Admitting: *Deleted

## 2022-01-08 ENCOUNTER — Telehealth: Payer: Self-pay | Admitting: *Deleted

## 2022-01-08 DIAGNOSIS — C50919 Malignant neoplasm of unspecified site of unspecified female breast: Secondary | ICD-10-CM

## 2022-01-08 DIAGNOSIS — G620 Drug-induced polyneuropathy: Secondary | ICD-10-CM

## 2022-01-08 MED ORDER — INV-PALMITOYLETHANOLAMIDE/PLACEBO 400 MG CAPS ACCRU-~~LOC~~-2102: ORAL_CAPSULE | ORAL | 0 refills | Status: DC

## 2022-01-08 NOTE — Telephone Encounter (Signed)
[facility]U Baseline Questionnaires ?Called patient to clarify some answers on her baseline questionnaires. ?EORTC [id number] ?#31- Both 3 and 4 are circled.  Patient clarified answer is "3" ?#33- Both 1 and 3 are circled. Patient clarified answer is "3" ?#36- Both 1 and 3 are circled. Patient clarified answer is "1" ?#40- Both 1 and 2 are circled. Patient clarified answer is "1" ?#42- Both 2 and 4 are circled. Patient clarified answer is "4" ? ?Peripheral Neuropathy Question ?How much has a problem has numbness, tingling or pain in your fingers and/or toes been in the past week?  ?Both 0 and 8 are circled and then 8 is crossed out.   ?Patient clarified that she has numbness in her fingers and toes and she meant to answer "8" ?Patient reports she does not have any pain but does have numbness.   ? ?Thanked patient for clarifying her answers.  Reminded her to start the study drug tomorrow morning and call with any questions or concerns. She verbalized understanding.  ? ?The questionnaire forms were updated according to patient's verbal clarifications in this note.  ?[name], BSN, RN, CCRP ?Clinical Research Nurse II ?01/08/2022 3:37 PM ?  ?

## 2022-01-08 NOTE — Research (Signed)
[facility]CRU-[place]-2102 - TREATMENT OF ESTABLISHED CHEMOTHERAPY-INDUCED NEUROPATHY WITH N-PALMITOYLETHANOLAMIDE, A CANNABIMIMETIC NUTRACEUTICAL: A RANDOMIZED DOUBLE-BLIND PHASE II PILOT TRIAL  ? ?INVESTIGATIONAL PRODUCT: Investigational product was prepared and labeled by Raul Del, Pharmacist.. Patient was provided 4 bottle(s) of investigational product with 120 pills total (30 each bottle). Patient was instructed to take one tablet 2 times per day for eight (8) weeks starting tomorrow morning 01/09/22. Patient was instructed to store study drug at room temperature with the lid tight closed. Patient was instructed to swallow capsules whole (do not crush or open capsules) and with food. Patient does verbalize understanding of how to take investigational product.  ?  ?BASELINE VISIT: Patient was provided baseline questionnaire booklet at consent visit and then called with instructions to complete on 01/06/22. Research nurse collected the questionnaire and reviewed for completeness and accuracy. Patient was then provided with weekly questionnaire booklets, prelabeled with the week number and cycle dates, in chronological order. Patient was provided with a self-addressed stamped envelope for returning completed questionnaires weekly. Patient was instructed to complete the weekly questionnaire at the end of each week and return completed questionnaires in the provided self-addressed stamped envelopes. Patient does verbalize understanding.  ?  ?FIRST FOLLOW-UP PHONE CALL: Patient agrees to their first weekly follow-up phone call next Friday 01/16/22. ?The patient was thanked for their time and continued voluntary participation in this study. Patient has been provided direct contact information and is encouraged to contact this Nurse for any needs or questions. ? ?[name], BSN, RN, CCRP ?Clinical Research Nurse II ?01/08/2022 10:38 AM ? ?

## 2022-01-16 ENCOUNTER — Encounter: Payer: Self-pay | Admitting: *Deleted

## 2022-01-16 DIAGNOSIS — G620 Drug-induced polyneuropathy: Secondary | ICD-10-CM

## 2022-01-16 NOTE — Research (Signed)
[facility]CRU-[place]-2102 - TREATMENT OF ESTABLISHED CHEMOTHERAPY-INDUCED NEUROPATHY WITH N-PALMITOYLETHANOLAMIDE, A CANNABIMIMETIC NUTRACEUTICAL: A RANDOMIZED DOUBLE-BLIND PHASE II PILOT TRIAL ?  ?WEEK 1 PHONE CALL: Patient was contacted via phone today to complete study week 1 phone call. Identity was confirmed using two patient identifiers.  ?             ?QUESTIONNAIRE: Patient has not completed their weekly questionnaire. She says she forgot because she was busy watching her twin grand babies yesterday. She says she will complete them and place them in the outgoing mail today using the prepaid envelope provided.   ?  ?INVESTIGATIONAL PRODUCT: Patient reports taking 15 capsules since starting on 01/09/22. Patient is always compliant with study medication. Patient is re-educated on the importance of taking study medication as directed once in the morning with food and once in the evening with food: patient does verbalize understanding.  ?Patient denies use of other cannbinoid products such as BCD and/or THC. Patient denies any new medications.  ?  ?ADVERSE EFFECTS: Patient Reports adverse effects: Low blood sugar one morning of 67 when it is usually around 100. Patient states it is not uncommon for her blood sugar to "dip down" occasionally so this is not anything new for her. She did have mild nausea (grade 1) on the 3rd morning of taking study drug. She did not take anything for it, was able to eat and it resolved on it's own. She denies any vomiting or diarrhea.   ?  ?Adverse Event Log ?  ?Study/Protocol: ACCRU [id number] ?Week 1 01/09/22-01/15/22 ?  ?Event Grade Onset Date Resolved Date Drug Name Attribution Treatment Comments  ?Nausea Grade 1 01/11/22 01/11/22 PEA/Placebo Possible None    ?  ?  ?Patient denied any questions for research nurse today. The patient was thanked for their time and continued voluntary participation in this study. Patient has been provided direct contact information and is encouraged to contact this  Nurse for any needs or questions. ? ?[name], BSN, RN, CCRP ?Clinical Research Nurse II ?01/16/2022 10:34 AM ? ?

## 2022-01-19 ENCOUNTER — Encounter: Payer: Self-pay | Admitting: Hematology and Oncology

## 2022-01-21 ENCOUNTER — Telehealth: Payer: Self-pay | Admitting: *Deleted

## 2022-01-21 NOTE — Telephone Encounter (Signed)
ACCRU-[place]-2102 Study: Received completed week 1 questionnaires in mail yesterday afternoon.  Called patient to confirm date they were completed and she reports they were completed on Friday 01/16/22. Also clarified compliance with study drug. Patient reported no missed doses on our call Friday 4/14. On the Symptom Experience Diary patient reported missing a dose "1-2 times" over the past week. Asked patient to clarify and she reports that she did not miss any doses since starting the study drug and she made a mistake on the questionnaire.  Patient states the answer to that question #4 is "0 times."   Correction made on the questionnaire.  Also informed patient that for future questionnaires, the question related to getting an erection only applies to men so she should not answer that question. Patient verbalized understanding.  ?Reminded patient to complete week 2 questionnaires this week on Thursday and research nurse will call her on Friday. Encouraged her to call research nurse if any questions or concerns before Friday.  Thanked patient for her time. She verbalized understanding.  ?[name], BSN, RN, CCRP ?Clinical Research Nurse II ?01/21/2022 11:34 AM ? ?

## 2022-01-23 ENCOUNTER — Encounter: Payer: Self-pay | Admitting: *Deleted

## 2022-01-23 DIAGNOSIS — G620 Drug-induced polyneuropathy: Secondary | ICD-10-CM

## 2022-01-23 NOTE — Research (Signed)
[facility]CRU-[place]-2102 - TREATMENT OF ESTABLISHED CHEMOTHERAPY-INDUCED NEUROPATHY WITH N-PALMITOYLETHANOLAMIDE, A CANNABIMIMETIC NUTRACEUTICAL: A RANDOMIZED DOUBLE-BLIND PHASE II PILOT TRIAL ?  ?WEEK 2 PHONE CALL: Patient was contacted via phone today to complete study week 2 phone call. Identity was confirmed using two patient identifiers.  ?             ?QUESTIONNAIRE: Patient completed their weekly questionnaire yesterday and placed them in the outgoing mail using the prepaid envelope provided.   ?  ?INVESTIGATIONAL PRODUCT: Patient reports taking 14 capsules since 01/16/22. Patient is always compliant with study medication. Patient is re-educated on the importance of taking study medication as directed once in the morning with food and once in the evening with food: patient does verbalize understanding.  ?Patient denies use of other cannbinoid products such as BCD and/or THC. Patient denies any new medications.  ?  ?ADVERSE EFFECTS: Patient does not report any adverse effects this past week. She denies any nausea, vomiting or diarrhea in the past week.   ?   ?Patient denied any questions for research nurse today. The patient was thanked for their time and continued voluntary participation in this study. Patient has been provided direct contact information and is encouraged to contact this Nurse for any needs or questions. ? ?[name], BSN, RN, CCRP ?Clinical Research Nurse II ?01/23/2022 9:44 AM ? ?

## 2022-01-30 ENCOUNTER — Encounter: Payer: Self-pay | Admitting: *Deleted

## 2022-01-30 ENCOUNTER — Telehealth: Payer: Self-pay | Admitting: *Deleted

## 2022-01-30 DIAGNOSIS — G620 Drug-induced polyneuropathy: Secondary | ICD-10-CM

## 2022-01-30 NOTE — Research (Signed)
[facility]CRU-[place]-2102 - TREATMENT OF ESTABLISHED CHEMOTHERAPY-INDUCED NEUROPATHY WITH N-PALMITOYLETHANOLAMIDE, A CANNABIMIMETIC NUTRACEUTICAL: A RANDOMIZED DOUBLE-BLIND PHASE II PILOT TRIAL ?  ?WEEK 3 PHONE CALL: Patient was contacted via phone today to complete study week 3 phone call. Identity was confirmed using two patient identifiers.  ?             ?QUESTIONNAIRE: Patient completed their weekly questionnaire yesterday and placed them in the outgoing mail using the prepaid envelope provided.   ?  ?INVESTIGATIONAL PRODUCT: Patient reports taking 14 capsules since 01/23/22. Patient is always compliant with study medication. Patient is re-educated on the importance of taking study medication as directed once in the morning with food and once in the evening with food: patient does verbalize understanding.  ?Patient denies use of other cannbinoid products such as BCD and/or THC. Patient denies any new medications.  ?  ?ADVERSE EFFECTS: Patient does not report any adverse effects this past week. She denies any nausea, vomiting or diarrhea in the past week.   ?   ?Patient denied any questions for research nurse today. The patient was thanked for their time and continued voluntary participation in this study. Patient has been provided direct contact information and is encouraged to contact this Nurse for any needs or questions. ? ?[name], BSN, RN, CCRP ?Clinical Research Nurse II ?01/30/2022 10:05 AM ? ?

## 2022-01-30 NOTE — Telephone Encounter (Signed)
Opened in Error.

## 2022-02-04 ENCOUNTER — Other Ambulatory Visit: Payer: Self-pay | Admitting: Adult Health

## 2022-02-04 DIAGNOSIS — Z9889 Other specified postprocedural states: Secondary | ICD-10-CM

## 2022-02-06 ENCOUNTER — Encounter: Payer: Self-pay | Admitting: *Deleted

## 2022-02-06 DIAGNOSIS — G620 Drug-induced polyneuropathy: Secondary | ICD-10-CM

## 2022-02-06 NOTE — Research (Signed)
[facility]CRU-[place]-2102 - TREATMENT OF ESTABLISHED CHEMOTHERAPY-INDUCED NEUROPATHY WITH N-PALMITOYLETHANOLAMIDE, A CANNABIMIMETIC NUTRACEUTICAL: A RANDOMIZED DOUBLE-BLIND PHASE II PILOT TRIAL ?  ?WEEK 4 PHONE CALL: Patient was contacted via phone today to complete study week 4 phone call. Identity was confirmed using two patient identifiers.  ?             ?QUESTIONNAIRE: Patient completed their weekly questionnaire yesterday and placed them in the outgoing mail using the prepaid envelope provided.   ?Note regarding week 3 questionnaires: Received week 3 questionnaires in the mail and noted that patient answered she missed "1 to 2" doses of study medication over the past week. During our week 3 phone call patient stated she did not miss any doses.  Asked patient to clarify and she states she has not missed any doses since she started the study.  She states she doesn't always understand the questions. Reviewed the question with patient and she verbalized understanding to answer "0" if she did not miss any doses and she can also answer the number of times she did miss if she missed any doses in the previous week. Patient verbalized understanding and the week 3 diary was corrected per patient's verbal report.  ?  ?INVESTIGATIONAL PRODUCT: Patient reports taking 14 capsules since 01/30/22. Patient is always compliant with study medication. Patient is re-educated on the importance of taking study medication as directed once in the morning with food and once in the evening with food: patient does verbalize understanding.  ?Patient denies use of other cannbinoid products such as CBD and/or THC. Patient denies any new medications.  ?  ?ADVERSE EFFECTS: Patient does not report any adverse effects this past week. She denies any nausea, vomiting or diarrhea in the past week.   ?Patient reports she has had arthritis and knee pain for at least one year and it has been getting worse over the past few months before she started on this study.  She recently had MRI which indicates the need for surgery. Knee surgery to "clean up the arthritis" is scheduled for later this Month on 02/25/22.  Patient asks how this will affect the study and if she needs to hold the study drug for surgery since she will likely be given narcotic pain medication during and maybe for a few days afterwards.  Informed patient that this research nurse will find out from the study what we should do in this case.  I will talk to her about it again next week.  ?   ?Patient denied any questions for research nurse today. The patient was thanked for their time and continued voluntary participation in this study. Patient has been provided direct contact information and is encouraged to contact this Nurse for any needs or questions. ? ?[name], BSN, RN, CCRP ?Clinical Research Nurse II ?02/06/2022 9:37 AM ? ?

## 2022-02-10 ENCOUNTER — Encounter: Payer: Self-pay | Admitting: *Deleted

## 2022-02-10 NOTE — Progress Notes (Signed)
RN successfully faxed Medical Clearance form to [place] [phone]). ?

## 2022-02-11 ENCOUNTER — Encounter: Payer: Self-pay | Admitting: Hematology and Oncology

## 2022-02-13 ENCOUNTER — Encounter: Payer: Self-pay | Admitting: *Deleted

## 2022-02-13 DIAGNOSIS — G620 Drug-induced polyneuropathy: Secondary | ICD-10-CM

## 2022-02-13 NOTE — Research (Signed)
[facility]CRU-[place]-2102 - TREATMENT OF ESTABLISHED CHEMOTHERAPY-INDUCED NEUROPATHY WITH N-PALMITOYLETHANOLAMIDE, A CANNABIMIMETIC NUTRACEUTICAL: A RANDOMIZED DOUBLE-BLIND PHASE II PILOT TRIAL ?  ?WEEK 5 PHONE CALL: Patient was contacted via phone today to complete study week 5 phone call. Identity was confirmed using two patient identifiers.  ?             ?QUESTIONNAIRE: Patient completed their weekly questionnaire yesterday and states she will place them in the outgoing mail today using the prepaid envelope provided.   ?  ?INVESTIGATIONAL PRODUCT: Patient reports taking 14 capsules since 02/06/22. Patient is always compliant with study medication. Patient is re-educated on the importance of taking study medication as directed once in the morning with food and once in the evening with food: patient does verbalize understanding.  ?Patient denies use of other cannbinoid products such as CBD and/or THC. Patient denies any new medications.  ?  ?ADVERSE EFFECTS: Patient does not report any adverse effects this past week. She denies any nausea, vomiting or diarrhea in the past week.   ?  ?Planned Surgery:  Informed patient that this research nurse received communication from the study it is okay to continue on study during surgery. Informed patient she does not need to hold the study drug or refrain from taking any pain medication or surgical medications as prescribed if needed. The study chairs were notified of patient's planned surgery during week 7 with the possibility for receiving opioid pain medication and gabapentin peri-operatively. Their response was "I think patient should do what is recommended for the surgery and continue on PEA study. I would not consider this a violation of any sort and do not think that anything further is needed."  ?Instructed patient to make note if she does miss a dose of study drug due to the surgery.  Also instructed patient to notify research nurse of new medications. She verbalize  understanding.  ?   ?Patient denied any questions for research nurse today. The patient was thanked for their time and continued voluntary participation in this study. Patient has been provided direct contact information and is encouraged to contact this Nurse for any needs or questions. ? ?[name], BSN, RN, CCRP ?Clinical Research Nurse II ?02/13/2022 9:18 AM ? ?

## 2022-02-20 ENCOUNTER — Encounter: Payer: Self-pay | Admitting: *Deleted

## 2022-02-20 DIAGNOSIS — G620 Drug-induced polyneuropathy: Secondary | ICD-10-CM

## 2022-02-20 NOTE — Research (Signed)
[facility]U-[place]-2102 - TREATMENT OF ESTABLISHED CHEMOTHERAPY-INDUCED NEUROPATHY WITH N-PALMITOYLETHANOLAMIDE, A CANNABIMIMETIC NUTRACEUTICAL: A RANDOMIZED DOUBLE-BLIND PHASE II PILOT TRIAL   WEEK 6 PHONE CALL: Patient was contacted via phone today to complete study week 6 phone call. Identity was confirmed using two patient identifiers.               QUESTIONNAIRE: Patient completed their weekly questionnaire yesterday and states she is putting them in the outgoing mail now using the prepaid envelope provided.  (Week 5 questionnaires have not arrived at our office yet. Patient states she mailed them last week as previously reported).    INVESTIGATIONAL PRODUCT: Patient reports taking 14 capsules since 02/13/22. Patient is always compliant with study medication. Patient is re-educated on the importance of taking study medication as directed once in the morning with food and once in the evening with food: patient does verbalize understanding.  Patient denies use of other cannbinoid products such as CBD and/or THC. Patient denies any new medications.    ADVERSE EFFECTS: Patient does not report any adverse effects this past week. She denies any nausea, vomiting or diarrhea in the past week.     Planned Surgery:  Knee surgery is scheduled for 02/25/22. Reminded patient it is okay to continue on study and she does not need to hold the study drug for surgery. Instructed patient to make note if she does miss a dose of study drug due to the surgery or any other reason.  Also instructed patient to notify research nurse of new medications. She verbalized understanding.     Patient denied any questions for research nurse today. The patient was thanked for their time and continued voluntary participation in this study. Patient has been provided direct contact information and is encouraged to contact this Nurse for any needs or questions.  [name], BSN, RN, CCRP Clinical Research Nurse II 02/20/2022 11:29 AM

## 2022-02-27 ENCOUNTER — Encounter: Payer: Self-pay | Admitting: *Deleted

## 2022-02-27 DIAGNOSIS — G620 Drug-induced polyneuropathy: Secondary | ICD-10-CM

## 2022-02-27 NOTE — Research (Signed)
[facility]CRU-[place]-2102 - TREATMENT OF ESTABLISHED CHEMOTHERAPY-INDUCED NEUROPATHY WITH N-PALMITOYLETHANOLAMIDE, A CANNABIMIMETIC NUTRACEUTICAL: A RANDOMIZED DOUBLE-BLIND PHASE II PILOT TRIAL   WEEK 7 PHONE CALL: Patient was contacted via phone today to complete study week 7 phone call. Identity was confirmed using two patient identifiers.               QUESTIONNAIRE: Patient completed their weekly questionnaire yesterday and states she will mail them today.      INVESTIGATIONAL PRODUCT: Patient reports taking 12 capsules since 02/20/22. She missed both doses on the day of her knee surgery 02/25/22. Patient is always compliant with study medication. Patient is re-educated on the importance of taking study medication as directed once in the morning with food and once in the evening with food.  Instructed patient to take last dose of study drug on Thursday 03/05/22. Do not take any doses after Thursday and hold on to empty bottles and any remaining study drug to return to research nurse. Patient verbalized understanding. Patient denies use of other cannbinoid products such as CBD and/or THC.  Patient reports she started taking Hydrocodone-acetaminophen PRN since her surgery 2 days ago for post operative knee pain. This research nurse received prior communication from study chair that patient taking pain medications after surgery would not be considered a protocol deviation.  Patient denies any other new medications.    ADVERSE EFFECTS: Patient does not report any adverse effects this past week. She denies any nausea, vomiting or diarrhea in the past week.  She did have knee surgery this week and states it went well with no complications. She is recovering but still has pain.     Patient denied any questions for research nurse today. The patient was thanked for their time and continued voluntary participation in this study. Patient has been provided direct contact information and is encouraged to contact this Nurse for  any needs or questions.  [name], BSN, RN, [employer] Research Nurse II 02/27/2022 10:29 AM

## 2022-03-06 ENCOUNTER — Encounter: Payer: Self-pay | Admitting: *Deleted

## 2022-03-06 DIAGNOSIS — T451X5A Adverse effect of antineoplastic and immunosuppressive drugs, initial encounter: Secondary | ICD-10-CM

## 2022-03-06 NOTE — Research (Signed)
[facility]CRU-[place]-2102 - TREATMENT OF ESTABLISHED CHEMOTHERAPY-INDUCED NEUROPATHY WITH N-PALMITOYLETHANOLAMIDE, A CANNABIMIMETIC NUTRACEUTICAL: A RANDOMIZED DOUBLE-BLIND PHASE II PILOT TRIAL   WEEK 8 PHONE CALL: Patient was contacted via phone today to complete study week 8 phone call. Identity was confirmed using two patient identifiers.               QUESTIONNAIRE: Patient completed their weekly questionnaire yesterday and states she will drop them off at our clinic sometime next week.       INVESTIGATIONAL PRODUCT: Patient reports taking 14 capsules since 02/27/22 with her last dose yesterday on 03/05/22.  Patient is always compliant with study medication.  Patient denies use of other cannbinoid products such as CBD and/or THC this past week.  Patient denies any other new medications in the past week. She continues to use Hydrocodone PRN for post op knee pain.     ADVERSE EFFECTS: Patient does not report any adverse effects this past week. She denies any nausea, vomiting or diarrhea in the past week.      PLAN:  Patient states she can return bottles of study drug with remaining pills along with questionnaire next week to research nurse.  She requests call back on Monday to arrange date/time.  Patient requests to know which drug she was taking (PEA or Placebo) and Dr. Lindi Adie agrees it would benfit patient to be unblinded on this study.  Will request the study drug be unblinded after all data entry is completed. Patient verbalized understanding.   The patient was thanked for their time and voluntary participation in this study. Patient has been provided direct contact information and is encouraged to contact this Nurse for any needs or questions. [name], BSN, RN, [employer] Clinical Research Nurse II 03/06/2022 12:04 PM

## 2022-03-09 ENCOUNTER — Telehealth: Payer: Self-pay | Admitting: *Deleted

## 2022-03-09 NOTE — Telephone Encounter (Signed)
ACCRU [id number]; Patient states she can come by [facility] tomorrow around 10 am to drop off the left over study drug and bottles along with week 8 questionnaires. She will call research nurse when she is on her way so nurse can meet her out front in the car circle.  Thanked patient for her time and plan on seeing her tomorrow.  [name], BSN, RN, CCRP Clinical Research Nurse II 03/09/2022 11:27 AM

## 2022-03-10 ENCOUNTER — Encounter: Payer: Self-pay | Admitting: *Deleted

## 2022-03-10 DIAGNOSIS — G620 Drug-induced polyneuropathy: Secondary | ICD-10-CM

## 2022-03-10 NOTE — Research (Signed)
[facility] [place]102:  Study Drug: Patient dropped off 3 empty bottles of study drug and 1 bottle with 6 capsules left. 10 capsules is the expected number of pills left if patient missed 2 doses as she reported during week 7.  There are 4 more capsules left than expected. Patient denies taking any capsules after week 8. She says there were a few days during the study where she didn't remember if she had taken the drug or not so it's possible she took more than 2 a day on some days. She is not sure.  All 4 bottles with the 6 remaining capsules returned to Kathy Reese, Software engineer, at [facility] for recording.    Questionnaires: Patient also dropped off the completed week 8 questionnaires.  The "Chemotherapy Induced Peripheral Neuropahty Assessment Tool" and the "Symptom Experience Diary- During Treatment" were not completed. Called patient and she completed verbally over the phone. This research nurse recorded her answers on the questionnaires.  Plan: Patient would like to know if she was taking PEA or Placebo.  Dr. Lindi Reese agrees patient can be unblinded to help with future treatment of neuropathy.  Informed patient this nurse will request the un-blinding information from the study as soon as all the data entry is complete and will call her with the information. She verbalized understanding.  Thanked patient for bringing the study drug and questionnaires to the clinic.  Kathy Reese, BSN, RN, [place] Nurse II 03/10/2022 3:34 PM

## 2022-03-11 ENCOUNTER — Telehealth: Payer: Self-pay | Admitting: *Deleted

## 2022-03-11 NOTE — Telephone Encounter (Addendum)
[id number] Un-blinding Information: Notified by study randomization center that patient was taking Placebo twice daily on study.  Informed patient of this and provided information on obtaining micronized PEA 400 mg on her own to try once or twice daily if she wants to try it.  Informed patient that Dr. Lindi Adie agrees it is okay for patient to try off study to see if it will help her neuropathy. Patient says she thinks she will try it.  Encouraged patient to call research nurse if she has any questions.  Reminded patient that research nurse will contact her again in 6 months and 1 year for update for the study. She verbalized understanding.  Thanked patient for her participation in this study.  [name], BSN, RN, [place] Clinical Research Nurse II 03/11/2022 4:33 PM

## 2022-03-17 ENCOUNTER — Other Ambulatory Visit: Payer: Self-pay | Admitting: *Deleted

## 2022-04-02 ENCOUNTER — Ambulatory Visit
Admission: RE | Admit: 2022-04-02 | Discharge: 2022-04-02 | Disposition: A | Discharge status: Home / Self Care | Payer: Medicare Other | Source: Ambulatory Visit | Attending: Adult Health | Admitting: Adult Health

## 2022-04-02 DIAGNOSIS — Z9889 Other specified postprocedural states: Secondary | ICD-10-CM

## 2022-04-03 ENCOUNTER — Other Ambulatory Visit: Payer: Self-pay | Admitting: Nurse Practitioner

## 2022-06-16 DIAGNOSIS — J302 Other seasonal allergic rhinitis: Secondary | ICD-10-CM | POA: Insufficient documentation

## 2022-07-08 ENCOUNTER — Telehealth: Payer: Self-pay | Admitting: *Deleted

## 2022-07-08 NOTE — Telephone Encounter (Signed)
[facility]102 - TREATMENT OF ESTABLISHED CHEMOTHERAPY-INDUCED NEUROPATHY WITH N-PALMITOYLETHANOLAMIDE, A CANNABIMIMETIC NUTRACEUTICAL: A RANDOMIZED DOUBLE-BLIND PHASE II PILOT TRIAL    SURVIVAL FOLLOW-UP PHONE CALL: Patient was contacted via phone today to complete the 6 Months follow-up phone call. Identify was confirmed using two patient identifiers.   NEUROPATHY STATUS: Patient reports neuropathy symptoms are ongoing and unchanged since last contact. She denies taking any medications for neuropathy symptoms.  She has not tried taking the PEA on her own since stopping study intervention.   CANCER RECURRENCE: Patient has not had any evidence of recurrent cancer problems.   The patient was thanked for their time and continued voluntary participation in this study. Patient has been provided direct contact information and is encouraged to contact this Nurse for any needs or questions and understands last study follow up call will be conducted in 6 months.  [name], BSN, RN, [place] Nurse II [phone] 07/08/2022 11:18 AM

## 2022-07-28 ENCOUNTER — Telehealth: Payer: Self-pay | Admitting: *Deleted

## 2022-07-28 ENCOUNTER — Ambulatory Visit (INDEPENDENT_AMBULATORY_CARE_PROVIDER_SITE_OTHER): Payer: Medicare Other | Admitting: Obstetrics and Gynecology

## 2022-07-28 ENCOUNTER — Encounter: Payer: Self-pay | Admitting: Obstetrics and Gynecology

## 2022-07-28 VITALS — BP 130/64 | HR 66 | Ht 62.5 in | Wt 208.0 lb

## 2022-07-28 DIAGNOSIS — Z853 Personal history of malignant neoplasm of breast: Secondary | ICD-10-CM | POA: Diagnosis not present

## 2022-07-28 DIAGNOSIS — Z9189 Other specified personal risk factors, not elsewhere classified: Secondary | ICD-10-CM

## 2022-07-28 DIAGNOSIS — N6321 Unspecified lump in the left breast, upper outer quadrant: Secondary | ICD-10-CM

## 2022-07-28 DIAGNOSIS — N3946 Mixed incontinence: Secondary | ICD-10-CM

## 2022-07-28 LAB — URINALYSIS, COMPLETE
Bacteria, UA: NONE SEEN /HPF
Bilirubin Urine: NEGATIVE
Casts: NONE SEEN /LPF
Crystals: NONE SEEN /HPF
Glucose, UA: NEGATIVE
Hgb urine dipstick: NEGATIVE
Hyaline Cast: NONE SEEN /LPF
Ketones, ur: NEGATIVE
Leukocytes,Ua: NEGATIVE
Nitrite: NEGATIVE
RBC / HPF: NONE SEEN /HPF (ref 0–2)
Specific Gravity, Urine: 1.015 (ref 1.001–1.035)
WBC, UA: NONE SEEN /HPF (ref 0–5)
Yeast: NONE SEEN /HPF
pH: 5.5 (ref 5.0–8.0)

## 2022-07-28 NOTE — Telephone Encounter (Signed)
-----   Message from Salvadore Dom, MD sent at 07/28/2022 11:11 AM EDT ----- She has a h/o breast cancer and has a new nodularity in the left breast at 1-2 o'clock near the periphery. Please schedule her for diagnostic imaging.  Thanks, Sharee Pimple

## 2022-07-28 NOTE — Telephone Encounter (Signed)
Orders placed at the breast center, scheduled on 08/06/22 @ 11:10 am.  Patient informed.

## 2022-07-28 NOTE — Progress Notes (Signed)
71 y.o. [id number] Married Black or [place] American Not Hispanic or Latino female here for annual exam.     She has a h/o a hysterectomy/BSO. 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 She was getting yearly CA 125, per Dr Denman George she no longer needs these.     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 S/p lumpectomy, chemo and radiation, on Arimidex.  H/O GSI, stable, tolerable. Occasional urge incontinence.   Patient's last menstrual period was 02/02/1993 (exact date).          Sexually active: No.  The current method of family planning is status post hysterectomy.    Exercising: Yes.     Senior classes x2 a week  Smoker:  no  Health Maintenance: Pap:  08/29/13 normal  h/o hysterectomy ovary tumor 1c low malignancy. 2008 History of abnormal Pap:  no MMG:  04/02/22 density C Bi-rads 2 benign  BMD:   03/29/20 Normal.  Colonoscopy: 03-22-13 WNL repeat in 10 yrs   TDaP:  Unsure  Gardasil: NA      reports that she has never smoked. She has never used smokeless tobacco. She reports current alcohol use of about 1.0 standard drink of alcohol per week. She reports that she does not use drugs. She is retired. Husband has dementia, goes [facility] the day program at [facility].  She has 3 kids and 4 grand kids. Has 71 year old twin grand children that she watches a few days a week.   Past Medical History:  Diagnosis Date   Allergic rhinitis, cause unspecified    Arthritis    Breast cancer, right ([place])    COVID-19 virus infection 10/2020   Depression    Diverticulosis    History of Bell's palsy    Hypersomnia    Neuropathy    secondary to chemo   OSA on CPAP    not using at this time 06/15/19   Other and unspecified hyperlipidemia    Personal history of chemotherapy    Personal history of malignant neoplasm of ovary    Personal history of radiation therapy    Type II or unspecified type diabetes mellitus without mention of complication, not stated  as uncontrolled    Unspecified essential hypertension    Vertigo     Past Surgical History:  Procedure Laterality Date   ABDOMINAL ADHESION SURGERY  12/96   exc peritoneal cyst   BREAST BIOPSY Left 2000   stereotactic, negative   BREAST BIOPSY Right 05/08/2019   BREAST LUMPECTOMY Right 05/30/2019   BREAST LUMPECTOMY WITH RADIOACTIVE SEED AND SENTINEL LYMPH NODE BIOPSY Right 05/30/2019   Procedure: RIGHT BREAST LUMPECTOMY WITH BRACKETED RADIOACTIVE SEEDS  AND SENTINEL LYMPH NODE BIOPSY;  Surgeon: Stark Klein, MD;  Location: [place];  Service: General;  Laterality: Right;   BREAST REDUCTION SURGERY Bilateral 10/03   COLONOSCOPY     EYE SURGERY Left    cataracts   LAPAROSCOPIC SALPINGO OOPHERECTOMY Right 12/96   LAPAROSCOPIC SALPINGO OOPHERECTOMY Left 2/08   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    LAPAROSCOPY ABDOMEN DIAGNOSTIC  2/08   w/LOA, LSO   PORT-A-CATH REMOVAL Left 02/12/2021   Procedure: REMOVAL PORT-A-CATH;  Surgeon: Stark Klein, MD;  Location: [facility];  Service: General;  Laterality: Left;   PORTACATH PLACEMENT Left 06/16/2019   Procedure: INSERTION PORT-A-CATH;  Surgeon: Stark Klein, MD;  Location: [place];  Service: General;  Laterality: Left;   REDUCTION MAMMAPLASTY  Bilateral 2004   TONSILLECTOMY     TOTAL ABDOMINAL HYSTERECTOMY W/ BILATERAL SALPINGOOPHORECTOMY  5/94   secondary to uterine fibroids   TOTAL KNEE ARTHROPLASTY Right 05/12/2013   Procedure: TOTAL KNEE ARTHROPLASTY;  Surgeon: Alta Corning, MD;  Location: [place];  Service: Orthopedics;  Laterality: Right;   TRIGGER FINGER RELEASE  07/06/2012   Procedure: RELEASE TRIGGER FINGER/A-1 PULLEY;  Surgeon: Alta Corning, MD;  Location: [place];  Service: Orthopedics;  Laterality: Left;  left ring finger   TUBAL LIGATION      Current Outpatient Medications  Medication Sig Dispense Refill   ACCU-CHEK AVIVA PLUS test strip      Accu-Chek Softclix Lancets lancets      anastrozole (ARIMIDEX) 1 MG  tablet TAKE 1 TABLET BY MOUTH  DAILY 90 tablet 3   aspirin EC 81 MG tablet Take 81 mg by mouth daily.     atorvastatin (LIPITOR) 40 MG tablet Take 40 mg by mouth daily at 6 PM.     Calcium Carb-Cholecalciferol (CALCIUM 600+D3 PO) Take 2 tablets by mouth daily.     cholecalciferol (VITAMIN D3) 25 MCG (1000 UT) tablet Take 1,000 Units by mouth daily.     citalopram (CELEXA) 10 MG tablet Take 10 mg by mouth daily.     Continuous Blood Gluc Sensor (FREESTYLE LIBRE 14 DAY SENSOR) MISC Apply topically every 14 (fourteen) days.     diclofenac Sodium (VOLTAREN) 1 % GEL Apply 2 g topically 4 (four) times daily as needed (knee pain).     hydrochlorothiazide (HYDRODIURIL) 25 MG tablet Take 1 tablet by mouth daily.     HYDROcodone-acetaminophen (NORCO) 10-325 MG tablet Take 1 tablet by mouth every 6 (six) hours as needed (post op knee pain).     insulin detemir (LEVEMIR) 100 UNIT/ML injection Inject 15 Units into the skin daily with breakfast.     losartan (COZAAR) 100 MG tablet Take 1 tablet by mouth daily.     metFORMIN (GLUCOPHAGE) 500 MG tablet Take 500 mg by mouth every morning.      metoprolol succinate (TOPROL-XL) 100 MG 24 hr tablet Take 100 mg by mouth daily.     No current facility-administered medications for this visit.    Family History  Problem Relation Age of Onset   Lung cancer Mother    Colon cancer Father    Stroke Brother    Stroke Sister    Pulmonary Hypertension Sister     Review of Systems  All other systems reviewed and are negative.   Exam:   BP 130/64   Pulse 66   Ht 5' 2.5" (1.588 m)   Wt 208 lb (94.3 kg)   LMP 02/02/1993 (Exact Date)   SpO2 100%   BMI 37.44 kg/m   Weight change: '@WEIGHTCHANGE'$ @ Height:   Height: 5' 2.5" (158.8 cm)  Ht Readings from Last 3 Encounters:  07/28/22 5' 2.5" (1.588 m)  01/05/22 '5\' 3"'$  (1.6 m)  12/02/21 '5\' 3"'$  (1.6 m)    General appearance: alert, cooperative and appears stated age Head: Normocephalic, without obvious abnormality,  atraumatic Neck: no adenopathy, supple, symmetrical, trachea midline and thyroid normal to inspection and palpation Breasts:  evidence of bilateral breast surgery. In the left breast at 1-2 o'clock near the periphery is an area of increased nodularity. No other masses Abdomen: soft, non-tender; non distended,  no masses,  no organomegaly Extremities: extremities normal, atraumatic, no cyanosis or edema Skin: Skin color, texture, turgor normal. No rashes or lesions  Lymph nodes: Cervical, supraclavicular, and axillary nodes normal. No abnormal inguinal nodes palpated Neurologic: Grossly normal   Pelvic: External genitalia:  no lesions              Urethra:  normal appearing urethra with no masses, tenderness or lesions              Bartholins and Skenes: normal                 Vagina: normal appearing vagina with normal color and discharge, no lesions              Cervix: absent               Bimanual Exam:  Uterus:  uterus absent              Adnexa: no mass, fullness, tenderness               Rectovaginal: Confirms               Anus:  normal sphincter tone, no lesions  Gae Dry, CMA chaperoned for the exam.  1. GYN exam for high-risk Medicare patient Discussed breast self exam Discussed calcium and vit D intake Mammogram UTD Colonoscopy next year DEXA UTD Labs with primary  2. History of breast cancer  3. Mixed incontinence Worsened - Urinalysis, Complete -Information on kegels given and discussed  4. Breast lump on left side at 1 o'clock position Normal mammogram in 6/23 Will set up diagnostic imaging left breast, upper outer quadrant.

## 2022-07-28 NOTE — Patient Instructions (Addendum)
EXERCISE   We recommended that you start or continue a regular exercise program for good health. Physical activity is anything that gets your body moving, some is better than none. The CDC recommends 150 minutes per week of Moderate-Intensity Aerobic Activity and 2 or more days of Muscle Strengthening Activity.  Benefits of exercise are limitless: helps weight loss/weight maintenance, improves mood and energy, helps with depression and anxiety, improves sleep, tones and strengthens muscles, improves balance, improves bone density, protects from chronic conditions such as heart disease, high blood pressure and diabetes and so much more. To learn more visit: https://www.cdc.gov/physicalactivity/index.html  DIET: Good nutrition starts with a healthy diet of fruits, vegetables, whole grains, and lean protein sources. Drink plenty of water for hydration. Minimize empty calories, sodium, sweets. For more information about dietary recommendations visit: https://health.gov/our-work/nutrition-physical-activity/dietary-guidelines and https://www.myplate.gov/  ALCOHOL:  Women should limit their alcohol intake to no more than 7 drinks/beers/glasses of wine (combined, not each!) per week. Moderation of alcohol intake to this level decreases your risk of breast cancer and liver damage.  If you are concerned that you may have a problem, or your friends have told you they are concerned about your drinking, there are many resources to help. A well-known program that is free, effective, and available to all people all over the nation is Alcoholics Anonymous.  Check out this site to learn more: https://www.aa.org/   CALCIUM AND VITAMIN D:  Adequate intake of calcium and Vitamin D are recommended for bone health.  You should be getting between 1000-1200 mg of calcium and 800 units of Vitamin D daily between diet and supplements  PAP SMEARS:  Pap smears, to check for cervical cancer or precancers,  have traditionally been  done yearly, scientific advances have shown that most women can have pap smears less often.  However, every woman still should have a physical exam from her gynecologist every year. It will include a breast check, inspection of the vulva and vagina to check for abnormal growths or skin changes, a visual exam of the cervix, and then an exam to evaluate the size and shape of the uterus and ovaries. We will also provide age appropriate advice regarding health maintenance, like when you should have certain vaccines, screening for sexually transmitted diseases, bone density testing, colonoscopy, mammograms, etc.   MAMMOGRAMS:  All women over 40 years old should have a routine mammogram.   COLON CANCER SCREENING: Now recommend starting at age 45. At this time colonoscopy is not covered for routine screening until 50. There are take home tests that can be done between 45-49.   COLONOSCOPY:  Colonoscopy to screen for colon cancer is recommended for all women at age 50.  We know, you hate the idea of the prep.  We agree, BUT, having colon cancer and not knowing it is worse!!  Colon cancer so often starts as a polyp that can be seen and removed at colonscopy, which can quite literally save your life!  And if your first colonoscopy is normal and you have no family history of colon cancer, most women don't have to have it again for 10 years.  Once every ten years, you can do something that may end up saving your life, right?  We will be happy to help you get it scheduled when you are ready.  Be sure to check your insurance coverage so you understand how much it will cost.  It may be covered as a preventative service at no cost, but you should check   your particular policy.      Breast Self-Awareness Breast self-awareness means being familiar with how your breasts look and feel. It involves checking your breasts regularly and reporting any changes to your health care provider. Practicing breast self-awareness is  important. A change in your breasts can be a sign of a serious medical problem. Being familiar with how your breasts look and feel allows you to find any problems early, when treatment is more likely to be successful. All women should practice breast self-awareness, including women who have had breast implants. How to do a breast self-exam One way to learn what is normal for your breasts and whether your breasts are changing is to do a breast self-exam. To do a breast self-exam: Look for Changes  Remove all the clothing above your waist. Stand in front of a mirror in a room with good lighting. Put your hands on your hips. Push your hands firmly downward. Compare your breasts in the mirror. Look for differences between them (asymmetry), such as: Differences in shape. Differences in size. Puckers, dips, and bumps in one breast and not the other. Look at each breast for changes in your skin, such as: Redness. Scaly areas. Look for changes in your nipples, such as: Discharge. Bleeding. Dimpling. Redness. A change in position. Feel for Changes Carefully feel your breasts for lumps and changes. It is best to do this while lying on your back on the floor and again while sitting or standing in the shower or tub with soapy water on your skin. Feel each breast in the following way: Place the arm on the side of the breast you are examining above your head. Feel your breast with the other hand. Start in the nipple area and make  inch (2 cm) overlapping circles to feel your breast. Use the pads of your three middle fingers to do this. Apply light pressure, then medium pressure, then firm pressure. The light pressure will allow you to feel the tissue closest to the skin. The medium pressure will allow you to feel the tissue that is a little deeper. The firm pressure will allow you to feel the tissue close to the ribs. Continue the overlapping circles, moving downward over the breast until you feel your  ribs below your breast. Move one finger-width toward the center of the body. Continue to use the  inch (2 cm) overlapping circles to feel your breast as you move slowly up toward your collarbone. Continue the up and down exam using all three pressures until you reach your armpit.  Write Down What You Find  Write down what is normal for each breast and any changes that you find. Keep a written record with breast changes or normal findings for each breast. By writing this information down, you do not need to depend only on memory for size, tenderness, or location. Write down where you are in your menstrual cycle, if you are still menstruating. If you are having trouble noticing differences in your breasts, do not get discouraged. With time you will become more familiar with the variations in your breasts and more comfortable with the exam. How often should I examine my breasts? Examine your breasts every month. If you are breastfeeding, the best time to examine your breasts is after a feeding or after using a breast pump. If you menstruate, the best time to examine your breasts is 5-7 days after your period is over. During your period, your breasts are lumpier, and it may be more   difficult to notice changes. When should I see my health care provider? See your health care provider if you notice: A change in shape or size of your breasts or nipples. A change in the skin of your breast or nipples, such as a reddened or scaly area. Unusual discharge from your nipples. A lump or thick area that was not there before. Pain in your breasts. Anything that concerns you. Kegel Exercises  Kegel exercises can help strengthen your pelvic floor muscles. The pelvic floor is a group of muscles that support your rectum, small intestine, and bladder. In females, pelvic floor muscles also help support the uterus. These muscles help you control the flow of urine and stool (feces). Kegel exercises are painless and  simple. They do not require any equipment. Your provider may suggest Kegel exercises to: Improve bladder and bowel control. Improve sexual response. Improve weak pelvic floor muscles after surgery to remove the uterus (hysterectomy) or after pregnancy, in females. Improve weak pelvic floor muscles after prostate gland removal or surgery, in males. Kegel exercises involve squeezing your pelvic floor muscles. These are the same muscles you squeeze when you try to stop the flow of urine or keep from passing gas. The exercises can be done while sitting, standing, or lying down, but it is best to vary your position. Ask your health care provider which exercises are safe for you. Do exercises exactly as told by your health care provider and adjust them as directed. Do not begin these exercises until told by your health care provider. Exercises How to do Kegel exercises: Squeeze your pelvic floor muscles tight. You should feel a tight lift in your rectal area. If you are a female, you should also feel a tightness in your vaginal area. Keep your stomach, buttocks, and legs relaxed. Hold the muscles tight for up to 10 seconds. Breathe normally. Relax your muscles for up to 10 seconds. Repeat as told by your health care provider. Repeat this exercise daily as told by your health care provider. Continue to do this exercise for at least 4-6 weeks, or for as long as told by your health care provider. You may be referred to a physical therapist who can help you learn more about how to do Kegel exercises. Depending on your condition, your health care provider may recommend: Varying how long you squeeze your muscles. Doing several sets of exercises every day. Doing exercises for several weeks. Making Kegel exercises a part of your regular exercise routine. This information is not intended to replace advice given to you by your health care provider. Make sure you discuss any questions you have with your health  care provider. Document Revised: 01/30/2021 Document Reviewed: 01/30/2021 Elsevier Patient Education  2023 [employer]  

## 2022-08-06 ENCOUNTER — Ambulatory Visit
Admission: RE | Admit: 2022-08-06 | Discharge: 2022-08-06 | Disposition: A | Discharge status: Home / Self Care | Payer: Medicare Other | Source: Ambulatory Visit | Attending: Obstetrics and Gynecology | Admitting: Obstetrics and Gynecology

## 2022-08-06 DIAGNOSIS — Z853 Personal history of malignant neoplasm of breast: Secondary | ICD-10-CM

## 2022-08-06 DIAGNOSIS — N6321 Unspecified lump in the left breast, upper outer quadrant: Secondary | ICD-10-CM

## 2022-09-01 ENCOUNTER — Other Ambulatory Visit: Payer: Self-pay | Admitting: Hematology and Oncology

## 2022-11-19 ENCOUNTER — Encounter: Payer: Self-pay | Admitting: Obstetrics and Gynecology

## 2022-11-19 ENCOUNTER — Ambulatory Visit: Payer: Medicare Other | Admitting: Obstetrics and Gynecology

## 2022-11-19 VITALS — BP 112/64 | HR 72 | Wt 207.8 lb

## 2022-11-19 DIAGNOSIS — Z87898 Personal history of other specified conditions: Secondary | ICD-10-CM | POA: Diagnosis not present

## 2022-11-19 DIAGNOSIS — N6019 Diffuse cystic mastopathy of unspecified breast: Secondary | ICD-10-CM

## 2022-11-19 NOTE — Progress Notes (Signed)
GYNECOLOGY  VISIT   HPI: 72 y.o.   Married Black or [place] American Not Hispanic or Latino  female   [phone] with Patient's last menstrual period was 02/02/1993 (exact date).   here for  follow up breast exam. At the time of her routine exam in 10/23 she was noted to have an area of increased nodularity in the left breast at 1-2 o'clock. Imaging was benign, she is here for a f/u exam.   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 S/p lumpectomy, chemo and radiation, on Arimidex.  Husband is on hospice at home, she is his full time care giver. He has advanced dementia.     GYNECOLOGIC HISTORY: Patient's last menstrual period was 02/02/1993 (exact date). Contraception:pmp Menopausal hormone therapy: none         OB History     Gravida  4   Para  4   Term  3   Preterm  1   AB  0   Living  3      SAB  0   IAB  0   Ectopic  0   Multiple  0   Live Births  4              Patient Active Problem List   Diagnosis Date Noted   Seasonal allergies 06/16/2022   Breast cancer ([place]) 11/21/2020   Hyperglycemia due to type 2 diabetes mellitus ([place]) 11/21/2020   Proteinuria 11/21/2020   Vitamin D deficiency 11/21/2020   COVID-19 virus infection 10/2020   Chemotherapy-induced peripheral neuropathy ([place]) 03/26/2020   Port-A-Cath in place 07/11/2019   Malignant neoplasm of upper-inner quadrant of right breast in female, estrogen receptor positive ([place]) 04/19/2019   Facial weakness 11/07/2018   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 10/12/2018   History of hysterectomy 10/12/2018   History of bilateral salpingo-oophorectomy (BSO) 10/12/2018   Osteoarthritis of right knee 05/12/2013   HYPERLIPIDEMIA 12/30/2007   HYPERTENSION 12/30/2007   INSOMNIA 12/30/2007   HYPERSOMNIA [id number]   DM w/o Complication Type II [id number]   HYPERCHOLESTEROLEMIA 12/16/2007   EXOGENOUS OBESITY 12/16/2007   SLEEP APNEA, OBSTRUCTIVE 12/16/2007    ALLERGIC RHINITIS 12/16/2007    Past Medical History:  Diagnosis Date   Allergic rhinitis, cause unspecified    Arthritis    Breast cancer, right ([place])    COVID-19 virus infection 10/2020   Depression    Diverticulosis    History of Bell's palsy    Hypersomnia    Neuropathy    secondary to chemo   OSA on CPAP    not using at this time 06/15/19   Other and unspecified hyperlipidemia    Personal history of chemotherapy    Personal history of malignant neoplasm of ovary    Personal history of radiation therapy    Type II or unspecified type diabetes mellitus without mention of complication, not stated as uncontrolled    Unspecified essential hypertension    Vertigo     Past Surgical History:  Procedure Laterality Date   ABDOMINAL ADHESION SURGERY  12/96   exc peritoneal cyst   BREAST BIOPSY Left 2000   stereotactic, negative   BREAST BIOPSY Right 05/08/2019   BREAST LUMPECTOMY Right 05/30/2019   BREAST LUMPECTOMY WITH RADIOACTIVE SEED AND SENTINEL LYMPH NODE BIOPSY Right 05/30/2019   Procedure: RIGHT BREAST LUMPECTOMY WITH BRACKETED RADIOACTIVE SEEDS  AND SENTINEL LYMPH NODE BIOPSY;  Surgeon: Stark Klein, MD;  Location: [facility];  Service:  General;  Laterality: Right;   BREAST REDUCTION SURGERY Bilateral 10/03   COLONOSCOPY     EYE SURGERY Left    cataracts   LAPAROSCOPIC SALPINGO OOPHERECTOMY Right 12/96   LAPAROSCOPIC SALPINGO OOPHERECTOMY Left 2/08   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    LAPAROSCOPY ABDOMEN DIAGNOSTIC  2/08   w/LOA, LSO   PORT-A-CATH REMOVAL Left 02/12/2021   Procedure: REMOVAL PORT-A-CATH;  Surgeon: Stark Klein, MD;  Location: [facility];  Service: General;  Laterality: Left;   PORTACATH PLACEMENT Left 06/16/2019   Procedure: INSERTION PORT-A-CATH;  Surgeon: Stark Klein, MD;  Location: [facility];  Service: General;  Laterality: Left;   REDUCTION MAMMAPLASTY Bilateral 2004   TONSILLECTOMY     TOTAL ABDOMINAL HYSTERECTOMY W/ BILATERAL SALPINGOOPHORECTOMY   5/94   secondary to uterine fibroids   TOTAL KNEE ARTHROPLASTY Right 05/12/2013   Procedure: TOTAL KNEE ARTHROPLASTY;  Surgeon: Alta Corning, MD;  Location: [facility];  Service: Orthopedics;  Laterality: Right;   TRIGGER FINGER RELEASE  07/06/2012   Procedure: RELEASE TRIGGER FINGER/A-1 PULLEY;  Surgeon: Alta Corning, MD;  Location: [facility];  Service: Orthopedics;  Laterality: Left;  left ring finger   TUBAL LIGATION      Current Outpatient Medications  Medication Sig Dispense Refill   ACCU-CHEK AVIVA PLUS test strip      Accu-Chek Softclix Lancets lancets      anastrozole (ARIMIDEX) 1 MG tablet TAKE 1 TABLET BY MOUTH DAILY 100 tablet 2   aspirin EC 81 MG tablet Take 81 mg by mouth daily.     atorvastatin (LIPITOR) 40 MG tablet Take 40 mg by mouth daily at 6 PM.     Calcium Carb-Cholecalciferol (CALCIUM 600+D3 PO) Take 2 tablets by mouth daily.     cholecalciferol (VITAMIN D3) 25 MCG (1000 UT) tablet Take 1,000 Units by mouth daily.     citalopram (CELEXA) 10 MG tablet Take 10 mg by mouth daily.     Continuous Blood Gluc Sensor (FREESTYLE LIBRE 14 DAY SENSOR) MISC Apply topically every 14 (fourteen) days.     diclofenac Sodium (VOLTAREN) 1 % GEL Apply 2 g topically 4 (four) times daily as needed (knee pain).     hydrochlorothiazide (HYDRODIURIL) 25 MG tablet Take 1 tablet by mouth daily.     HYDROcodone-acetaminophen (NORCO) 10-325 MG tablet Take 1 tablet by mouth every 6 (six) hours as needed (post op knee pain).     insulin detemir (LEVEMIR) 100 UNIT/ML injection Inject 15 Units into the skin daily with breakfast.     losartan (COZAAR) 100 MG tablet Take 1 tablet by mouth daily.     metFORMIN (GLUCOPHAGE) 500 MG tablet Take 500 mg by mouth every morning.      metoprolol succinate (TOPROL-XL) 100 MG 24 hr tablet Take 100 mg by mouth daily.     No current facility-administered medications for this visit.     ALLERGIES: Patient has no known allergies.  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  Employer: [employer]  Tobacco Use   Smoking status: Never   Smokeless tobacco: Never  Vaping Use   Vaping Use: Never used  Substance and Sexual Activity   Alcohol use: Yes    Alcohol/week: 1.0 standard drink of alcohol    Types: 1 Glasses of wine per week    Comment: occasionally   Drug use: No   Sexual activity: Not Currently    Partners: Male    Birth control/protection: Abstinence  Other Topics Concern   Not on file  Social History Narrative   Not on file   Social Determinants of Health   Financial Resource Strain: Not on file  Food Insecurity: Not on file  Transportation Needs: Not on file  Physical Activity: Not on file  Stress: Not on file  Social Connections: Not on file  Intimate Partner Violence: Not on file    Review of Systems  All other systems reviewed and are negative.   PHYSICAL EXAMINATION:    BP 112/64   Pulse 72   Wt 207 lb 12.8 oz (94.3 kg)   LMP 02/02/1993 (Exact Date)   SpO2 100%   BMI 37.40 kg/m     General appearance: alert, cooperative and appears stated age Breasts:  evidence of right lumpectomy and bilateral breast reduction, prior area of nodularity in the periphery of left breast at 1-2 o'clock feels much less prominent. No distinct lumps, no skin changes.   1. History of breast lump Negative imaging F/U exam improved Routine f/u

## 2022-12-02 NOTE — Progress Notes (Signed)
Patient Care Team: Nolene Ebbs, MD as PCP - General (Internal Medicine) Nicholas Lose, MD as Consulting Physician (Hematology and Oncology) Stark Klein, MD as Consulting Physician (General Surgery) Gery Pray, MD as Consulting Physician (Radiation Oncology) Nolene Ebbs, MD (Internal Medicine) Salvadore Dom, MD as Consulting Physician (Obstetrics and Gynecology)  DIAGNOSIS: No diagnosis found.  SUMMARY OF ONCOLOGIC HISTORY: Oncology History  Malignant neoplasm of upper-inner quadrant of right breast in female, estrogen receptor positive ([place])  04/12/2019 Cancer Staging   Staging form: Breast, AJCC 8th Edition - Clinical stage from 04/12/2019: Stage IA (cT1b, cN0, cM0, G3, ER+, PR-, HER2+)    04/19/2019 Initial Diagnosis   Screening mammogram detected right breast asymmetry. Diagnostic mammogram and US showed 2 indeterminate right breast masses, 106m at 1 o'clock, 761mat 12 o'clock, with no axillary adenopathy. 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    05/30/2019 Surgery   Right lumpectomy (BBarry Dienes([id number] IDC with DCIS, grade 3, 0.6cm, clear margins, 3 axillary lymph nodes negative.    06/06/2019 Cancer Staging   Staging form: Breast, AJCC 8th Edition - Pathologic: Stage IA (pT1b, pN0, cM0, G3, ER+, PR-, HER2+)   06/27/2019 - 04/16/2020 Chemotherapy   PACLitaxel (TAXOL) 174 mg in sodium chloride 0.9 % 250 mL chemo infusion (</= '80mg'$ /m2), 80 mg/m2 = 174 mg, Intravenous,  Once, 3 of 3 cycles. Dose modification: 65 mg/m2 (original dose 80 mg/m2, Cycle 3, Reason: Dose not tolerated). Administration: 174 mg (06/27/2019), 174 mg (07/04/2019), 174 mg (07/25/2019), 174 mg (07/11/2019), 174 mg (07/18/2019), 174 mg (08/01/2019), 174 mg (08/08/2019), 174 mg (08/15/2019), 138 mg (08/22/2019)  trastuzumab-anns (KANJINTI) 399 mg in sodium chloride 0.9 % 250 mL chemo infusion, 4 mg/kg = 399 mg (100 % of original dose 4 mg/kg), Intravenous,  Once, 14  of 17 cycles. Dose modification: 4 mg/kg (original dose 4 mg/kg, Cycle 1, Reason: Other (see comments), Comment: Biosimilar Conversion; [facility] 6 mg/kg (original dose 2 mg/kg, Cycle 3, Reason: Provider Judgment), 6 mg/kg (original dose 2 mg/kg, Cycle 4, Reason: Other (see comments), Comment: switch to maintenance q3 weeks), 600 mg (original dose 2 mg/kg, Cycle 4, Reason: Other (see comments), Comment: maint q3 weeks), 525 mg (original dose 2 mg/kg, Cycle 11, Reason: Other (see comments), Comment: Weight loss). Administration: 399 mg (06/27/2019), 189 mg (07/04/2019), 189 mg (07/11/2019), 189 mg (07/18/2019), 189 mg (07/25/2019), 189 mg (08/01/2019), 189 mg (08/08/2019), 189 mg (08/15/2019), 189 mg (08/22/2019), 600 mg (08/29/2019), 600 mg (09/19/2019), 600 mg (10/10/2019), 600 mg (10/31/2019), 600 mg (11/21/2019), 600 mg (12/12/2019), 600 mg (01/02/2020), 525 mg (01/23/2020), 525 mg (02/13/2020), 600 mg (03/05/2020), 600 mg (03/26/2020), 600 mg (04/16/2020)   09/25/2019 - 10/27/2019 Radiation Therapy   The patient initially received a dose of 40.05 Gy in 15 fractions to the breast using whole-breast tangent fields. This was delivered using a 3-D conformal technique. The pt received a boost delivering an additional 10 Gy in 5 fractions using a electron boost with 1515melectrons. The total dose was 50.05 Gy.   11/2019 - 11/2026 Anti-estrogen oral therapy   Anastrozole     CHIEF COMPLIANT: Follow-up of breast cancer on anastrozole    INTERVAL HISTORY: Kathy Reese a 71 82o. with above-mentioned history of breast cancer who underwent a lumpectomy, adjuvant chemotherapy, radiation, Herceptin maintenance, and is currently on antiestrogen therapy with anastrozole.     ALLERGIES:  has No Known Allergies.  MEDICATIONS:  Current Outpatient Medications  Medication Sig Dispense  Refill   ACCU-CHEK AVIVA PLUS test strip      Accu-Chek Softclix Lancets lancets      anastrozole (ARIMIDEX) 1 MG tablet TAKE 1  TABLET BY MOUTH DAILY 100 tablet 2   aspirin EC 81 MG tablet Take 81 mg by mouth daily.     atorvastatin (LIPITOR) 40 MG tablet Take 40 mg by mouth daily at 6 PM.     Calcium Carb-Cholecalciferol (CALCIUM 600+D3 PO) Take 2 tablets by mouth daily.     cholecalciferol (VITAMIN D3) 25 MCG (1000 UT) tablet Take 1,000 Units by mouth daily.     citalopram (CELEXA) 10 MG tablet Take 10 mg by mouth daily.     Continuous Blood Gluc Sensor (FREESTYLE LIBRE 14 DAY SENSOR) MISC Apply topically every 14 (fourteen) days.     diclofenac Sodium (VOLTAREN) 1 % GEL Apply 2 g topically 4 (four) times daily as needed (knee pain).     hydrochlorothiazide (HYDRODIURIL) 25 MG tablet Take 1 tablet by mouth daily.     HYDROcodone-acetaminophen (NORCO) 10-325 MG tablet Take 1 tablet by mouth every 6 (six) hours as needed (post op knee pain).     insulin detemir (LEVEMIR) 100 UNIT/ML injection Inject 15 Units into the skin daily with breakfast.     losartan (COZAAR) 100 MG tablet Take 1 tablet by mouth daily.     metFORMIN (GLUCOPHAGE) 500 MG tablet Take 500 mg by mouth every morning.      metoprolol succinate (TOPROL-XL) 100 MG 24 hr tablet Take 100 mg by mouth daily.     No current facility-administered medications for this visit.    PHYSICAL EXAMINATION: ECOG PERFORMANCE STATUS: {CHL ONC ECOG PS:[phone]}  There were no vitals filed for this visit. There were no vitals filed for this visit.  BREAST:*** No palpable masses or nodules in either right or left breasts. No palpable axillary supraclavicular or infraclavicular adenopathy no breast tenderness or nipple discharge. (exam performed in the presence of a chaperone)  LABORATORY DATA:  I have reviewed the data as listed    Latest Ref Rng & Units 04/18/2021    4:58 PM 02/12/2021    8:17 AM 10/14/2020   12:34 PM  CMP  Glucose 65 - 99 mg/dL 110  181  149   BUN 7 - 25 mg/dL '18  16  19   '$ Creatinine 0.50 - 1.05 mg/dL 0.80  0.71  0.78   Sodium 135 - 146 mmol/L  139  139  135   Potassium 3.5 - 5.3 mmol/L 4.1  4.3  4.7   Chloride 98 - 110 mmol/L 103  105  100   CO2 20 - 32 mmol/L '27  27  27   '$ Calcium 8.6 - 10.4 mg/dL 9.6  9.0  9.2   Total Protein 6.1 - 8.1 g/dL 7.0     Total Bilirubin 0.2 - 1.2 mg/dL 0.6     AST 10 - 35 U/L 18     ALT 6 - 29 U/L 18       Lab Results  Component Value Date   WBC 7.0 04/18/2021   HGB 12.4 04/18/2021   HCT 38.8 04/18/2021   MCV 90.7 04/18/2021   PLT 234 04/18/2021   NEUTROABS 4.1 05/07/2020    ASSESSMENT & PLAN:  No problem-specific Assessment & Plan notes found for this encounter.    No orders of the defined types were placed in this encounter.  The patient has a good understanding of the overall plan. she agrees  with it. she will call with any problems that may develop before the next visit here. Total time spent: 30 mins including face to face time and time spent for planning, charting and co-ordination of care   Suzzette Righter, [place] 12/02/22    I Gardiner Coins am acting as a Education administrator for [employer]  ***

## 2022-12-03 ENCOUNTER — Inpatient Hospital Stay: Payer: Medicare Other | Attending: Hematology and Oncology | Admitting: Hematology and Oncology

## 2022-12-03 ENCOUNTER — Other Ambulatory Visit: Payer: Self-pay

## 2022-12-03 VITALS — BP 180/69 | HR 61 | Temp 97.5°F | Resp 18 | Ht 62.5 in | Wt 210.6 lb

## 2022-12-03 DIAGNOSIS — T451X5A Adverse effect of antineoplastic and immunosuppressive drugs, initial encounter: Secondary | ICD-10-CM | POA: Diagnosis not present

## 2022-12-03 DIAGNOSIS — C50211 Malignant neoplasm of upper-inner quadrant of right female breast: Secondary | ICD-10-CM | POA: Insufficient documentation

## 2022-12-03 DIAGNOSIS — Z923 Personal history of irradiation: Secondary | ICD-10-CM | POA: Insufficient documentation

## 2022-12-03 DIAGNOSIS — Z170 Estrogen receptor positive status [ER+]: Secondary | ICD-10-CM | POA: Insufficient documentation

## 2022-12-03 DIAGNOSIS — G620 Drug-induced polyneuropathy: Secondary | ICD-10-CM | POA: Diagnosis not present

## 2022-12-03 DIAGNOSIS — Z79811 Long term (current) use of aromatase inhibitors: Secondary | ICD-10-CM | POA: Insufficient documentation

## 2022-12-03 DIAGNOSIS — Z79899 Other long term (current) drug therapy: Secondary | ICD-10-CM | POA: Diagnosis not present

## 2022-12-03 DIAGNOSIS — Z9221 Personal history of antineoplastic chemotherapy: Secondary | ICD-10-CM | POA: Diagnosis not present

## 2022-12-03 NOTE — Assessment & Plan Note (Addendum)
04/19/2019:Screening mammogram detected right breast asymmetry. Diagnostic mammogram and US showed 2 indeterminate right breast masses, 32m at 1 o'clock, 767mat 12 o'clock, with no axillary adenopathy. 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  Stage Ia   05/30/2019: Right lumpectomy:Right lumpectomy ([facility] [employer] IDC with DCIS, grade 3, 0.6cm, clear margins, 3 axillary lymph nodes negative.  HER-2 positive (3+), ER+ (80%), PR -, Ki67 40%.  Stage Ia   Treatment plan: 1. Adjuvant chemo with Taxol Herceptin x9 cycles (stopped for neuropathy) 06/27/2019-08/22/2019, Herceptin completed 06/18/2020 2. Adjuvant radiation therapy 09/26/2019- 10/27/19 3. Adjuvant antiestrogen therapy with anastrozole started 11/21/2019 ------------------------------------------------------------------------------------------------------------------------------------------------------- Current treatment: anastrozole 12/21/2019: Echocardiogram EF 60 to 65%     Chemo-induced peripheral neuropathy: Rates it at 5 out of 10 Participated in PEA study   Breast cancer surveillance:  Mammogram and ultrasound 08/06/2022: Benign breast density category C Bone density: 03/29/2020: T score 0: Normal Breast exam 12/03/2022: Benign   She takes care of her grand daughters who are twins and age 27 82ears old. Her husband passed away a few days ago from Alzheimer's.   Return to clinic in 1 year for follow-up

## 2022-12-04 ENCOUNTER — Telehealth: Payer: Self-pay | Admitting: Hematology and Oncology

## 2022-12-04 NOTE — Telephone Encounter (Signed)
Called patient per 2/29 los notes to schedule f/u. Left voicemail with new appoitment information and contact details if needing to reschedule.

## 2023-01-07 ENCOUNTER — Telehealth: Payer: Self-pay | Admitting: *Deleted

## 2023-01-07 NOTE — Telephone Encounter (Signed)
[facility]-[id number] - TREATMENT OF ESTABLISHED CHEMOTHERAPY-INDUCED NEUROPATHY WITH N-PALMITOYLETHANOLAMIDE, A CANNABIMIMETIC NUTRACEUTICAL: A RANDOMIZED DOUBLE-BLIND PHASE II PILOT TRIAL    SURVIVAL FOLLOW-UP PHONE CALL: Patient was contacted via phone today to complete the 12 months follow-up phone call. Identify was confirmed using two patient identifiers.   NEUROPATHY STATUS: Patient reports neuropathy symptoms are ongoing and unchanged since last contact. It may be a little better but not any worse. She denies taking any medications for neuropathy symptoms.     CANCER RECURRENCE: Patient has not had any evidence of recurrent cancer problems.   The patient was thanked for their time and continued voluntary participation in this study. Informed patient this is the last study call for this visit as she has completed the 12 months on study. She verbalized understanding.    [name], BSN, RN, [facility] [phone] 01/07/2023 2:45 PM

## 2023-03-03 ENCOUNTER — Other Ambulatory Visit: Payer: Self-pay | Admitting: Obstetrics and Gynecology

## 2023-03-03 DIAGNOSIS — Z1231 Encounter for screening mammogram for malignant neoplasm of breast: Secondary | ICD-10-CM

## 2023-03-04 ENCOUNTER — Encounter: Payer: Self-pay | Admitting: Podiatry

## 2023-03-04 ENCOUNTER — Ambulatory Visit (INDEPENDENT_AMBULATORY_CARE_PROVIDER_SITE_OTHER): Payer: Medicare Other

## 2023-03-04 ENCOUNTER — Ambulatory Visit: Payer: Medicare Other | Admitting: Podiatry

## 2023-03-04 DIAGNOSIS — M722 Plantar fascial fibromatosis: Secondary | ICD-10-CM

## 2023-03-04 DIAGNOSIS — M778 Other enthesopathies, not elsewhere classified: Secondary | ICD-10-CM

## 2023-03-04 MED ORDER — TRIAMCINOLONE ACETONIDE 10 MG/ML IJ SUSP
10.0000 mg | Freq: Once | INTRAMUSCULAR | Status: AC
Start: 2023-03-04 — End: 2023-03-04
  Administered 2023-03-04: 10 mg

## 2023-03-04 NOTE — Progress Notes (Signed)
Subjective:   Patient ID: Kathy Reese, female   DOB: 72 y.o.   MRN: [id number]   HPI Patient states she*developed a lot of pain in the bottom of the right heel several weeks ago and does not remember injury and has had issues with the tendon over the years   ROS      Objective:  Physical Exam  Neurovascular status intact with patient found to have a painful right plantar fascia at the insertional point tendon calcaneus with fluid buildup acute in nature     Assessment:  Acute Planter fasciitis right     Plan:  Reviewed condition and x-ray sterile prep injected the fascia at insertion 3 mg Kenalog 5 mg Xylocaine advised on supportive therapy reappoint as needed  X-rays indicate spur formation no indications of stress fracture arthritis

## 2023-03-26 NOTE — Progress Notes (Signed)
Second to [employer] faxed to [phone] with receipt confirmation.

## 2023-04-06 ENCOUNTER — Ambulatory Visit
Admission: RE | Admit: 2023-04-06 | Discharge: 2023-04-06 | Disposition: A | Discharge status: Home / Self Care | Payer: Medicare Other | Source: Ambulatory Visit | Attending: Obstetrics and Gynecology | Admitting: Obstetrics and Gynecology

## 2023-04-06 DIAGNOSIS — Z1231 Encounter for screening mammogram for malignant neoplasm of breast: Secondary | ICD-10-CM

## 2023-04-23 ENCOUNTER — Other Ambulatory Visit: Payer: Self-pay | Admitting: Hematology and Oncology

## 2023-05-17 ENCOUNTER — Ambulatory Visit: Payer: Medicare Other | Admitting: Podiatry

## 2023-05-17 ENCOUNTER — Encounter: Payer: Self-pay | Admitting: Podiatry

## 2023-05-17 DIAGNOSIS — M722 Plantar fascial fibromatosis: Secondary | ICD-10-CM | POA: Diagnosis not present

## 2023-05-17 MED ORDER — TRIAMCINOLONE ACETONIDE 10 MG/ML IJ SUSP
10.0000 mg | Freq: Once | INTRAMUSCULAR | Status: AC
Start: 2023-05-17 — End: 2023-05-17
  Administered 2023-05-17: 10 mg via INTRA_ARTICULAR

## 2023-05-19 NOTE — Progress Notes (Signed)
Subjective:   Patient ID: Kathy Reese, female   DOB: 72 y.o.   MRN: [id number]   HPI Patient presents stating still getting some discomfort in the right foot it is really more in the heel   ROS      Objective:  Physical Exam  Neurovascular status intact with inflammation that has reoccurred in the plantar fascia right with patient also probable having some neuropathy the leg pain secondary to diabetes     Assessment:  Difficult to say between Planter fasciitis and the possibility for neuropathic like condition     Plan:  H&P reviewed sterile prep and injected the plantar fascia right 3 mg Kenalog 5 mg Xylocaine and will see this patient back again depending and may require other treatments may consider diabetic shoes to try for better arch support take some of the pressure off her feet

## 2023-08-04 ENCOUNTER — Ambulatory Visit: Payer: Medicare Other | Admitting: Obstetrics and Gynecology

## 2023-08-13 ENCOUNTER — Encounter: Payer: Self-pay | Admitting: Internal Medicine

## 2023-11-05 ENCOUNTER — Encounter: Payer: Self-pay | Admitting: Internal Medicine

## 2023-12-01 ENCOUNTER — Ambulatory Visit (AMBULATORY_SURGERY_CENTER): Payer: Medicare Other

## 2023-12-01 VITALS — Ht 66.0 in | Wt 212.0 lb

## 2023-12-01 DIAGNOSIS — Z1211 Encounter for screening for malignant neoplasm of colon: Secondary | ICD-10-CM

## 2023-12-01 MED ORDER — SUFLAVE 178.7 G PO SOLR
1.0000 | Freq: Once | ORAL | 0 refills | Status: DC
Start: 2023-12-01 — End: 2023-12-01

## 2023-12-01 MED ORDER — SUFLAVE 178.7 G PO SOLR
1.0000 | Freq: Once | ORAL | 0 refills | Status: AC
Start: 2023-12-01 — End: 2023-12-01

## 2023-12-01 NOTE — Progress Notes (Signed)

## 2023-12-09 ENCOUNTER — Inpatient Hospital Stay: Payer: Medicare Other | Attending: Hematology and Oncology | Admitting: Hematology and Oncology

## 2023-12-09 ENCOUNTER — Encounter: Payer: Self-pay | Admitting: Internal Medicine

## 2023-12-09 VITALS — HR 48 | Temp 98.3°F | Resp 20 | Ht 66.0 in | Wt 218.1 lb

## 2023-12-09 DIAGNOSIS — Z170 Estrogen receptor positive status [ER+]: Secondary | ICD-10-CM | POA: Insufficient documentation

## 2023-12-09 DIAGNOSIS — C50211 Malignant neoplasm of upper-inner quadrant of right female breast: Secondary | ICD-10-CM | POA: Insufficient documentation

## 2023-12-09 DIAGNOSIS — Z923 Personal history of irradiation: Secondary | ICD-10-CM | POA: Insufficient documentation

## 2023-12-09 DIAGNOSIS — Z79811 Long term (current) use of aromatase inhibitors: Secondary | ICD-10-CM | POA: Insufficient documentation

## 2023-12-09 DIAGNOSIS — T451X5A Adverse effect of antineoplastic and immunosuppressive drugs, initial encounter: Secondary | ICD-10-CM | POA: Insufficient documentation

## 2023-12-09 DIAGNOSIS — G620 Drug-induced polyneuropathy: Secondary | ICD-10-CM | POA: Diagnosis not present

## 2023-12-09 DIAGNOSIS — Z1722 Progesterone receptor negative status: Secondary | ICD-10-CM | POA: Diagnosis not present

## 2023-12-09 DIAGNOSIS — Z79899 Other long term (current) drug therapy: Secondary | ICD-10-CM | POA: Insufficient documentation

## 2023-12-09 DIAGNOSIS — Z1731 Human epidermal growth factor receptor 2 positive status: Secondary | ICD-10-CM | POA: Insufficient documentation

## 2023-12-09 DIAGNOSIS — Z9221 Personal history of antineoplastic chemotherapy: Secondary | ICD-10-CM | POA: Insufficient documentation

## 2023-12-09 NOTE — Assessment & Plan Note (Signed)
 04/19/2019:Screening mammogram detected right breast asymmetry. Diagnostic mammogram and US showed 2 indeterminate right breast masses, 6mm at 1 o'clock, 7mm at 12 o'clock, with no axillary adenopathy. 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  Stage Ia   05/30/2019: Right lumpectomy:Right lumpectomy [facility]): IDC with DCIS, grade 3, 0.6cm, clear margins, 3 axillary lymph nodes negative.  HER-2 positive (3+), ER+ (80%), PR -, Ki67 40%.  Stage Ia   Treatment plan: 1. Adjuvant chemo with Taxol Herceptin x9 cycles (stopped for neuropathy) 06/27/2019-08/22/2019, Herceptin completed 06/18/2020 2. Adjuvant radiation therapy 09/26/2019- 10/27/19 3. Adjuvant antiestrogen therapy with anastrozole started 11/21/2019 ------------------------------------------------------------------------------------------------------------------------------------------------------- Current treatment: anastrozole 12/21/2019: Echocardiogram EF 60 to 65%     Chemo-induced peripheral neuropathy: Rates it at 5 out of 10 Participated in PEA study   Breast cancer surveillance:  Mammogram 04/07/2023: Benign breast density category C Bone density: 03/29/2020: T score 0: Normal Breast exam 12/09/2023: Benign   She takes care of her grand daughters who are twins and age 46 years old. Her husband passed away from Alzheimer's in 2024.   Return to clinic in 1 year for follow-up

## 2023-12-09 NOTE — Progress Notes (Signed)
 Patient Care Team: Fleet Contras, MD as PCP - General (Internal Medicine) Serena Croissant, MD as Consulting Physician (Hematology and Oncology) Almond Lint, MD as Consulting Physician (General Surgery) Antony Blackbird, MD as Consulting Physician (Radiation Oncology) Fleet Contras, MD (Internal Medicine) Romualdo Bolk, MD (Inactive) as Consulting Physician (Obstetrics and Gynecology)  DIAGNOSIS:  Encounter Diagnosis  Name Primary?   Malignant neoplasm of upper-inner quadrant of right breast in female, estrogen receptor positive (HCC) Yes    SUMMARY OF ONCOLOGIC HISTORY: Oncology History  Malignant neoplasm of upper-inner quadrant of right breast in female, estrogen receptor positive (HCC)  04/12/2019 Cancer Staging   Staging form: Breast, AJCC 8th Edition - Clinical stage from 04/12/2019: Stage IA (cT1b, cN0, cM0, G3, ER+, PR-, HER2+)    04/19/2019 Initial Diagnosis   Screening mammogram detected right breast asymmetry. Diagnostic mammogram and US showed 2 indeterminate right breast masses, 6mm at 1 o'clock, 7mm at 12 o'clock, with no axillary adenopathy. 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    05/30/2019 Surgery   Right lumpectomy Donell Beers) [phone]): IDC with DCIS, grade 3, 0.6cm, clear margins, 3 axillary lymph nodes negative.    06/06/2019 Cancer Staging   Staging form: Breast, AJCC 8th Edition - Pathologic: Stage IA (pT1b, pN0, cM0, G3, ER+, PR-, HER2+)   06/27/2019 - 04/16/2020 Chemotherapy   PACLitaxel (TAXOL) 174 mg in sodium chloride 0.9 % 250 mL chemo infusion (</= 80mg /m2), 80 mg/m2 = 174 mg, Intravenous,  Once, 3 of 3 cycles. Dose modification: 65 mg/m2 (original dose 80 mg/m2, Cycle 3, Reason: Dose not tolerated). Administration: 174 mg (06/27/2019), 174 mg (07/04/2019), 174 mg (07/25/2019), 174 mg (07/11/2019), 174 mg (07/18/2019), 174 mg (08/01/2019), 174 mg (08/08/2019), 174 mg (08/15/2019), 138 mg  (08/22/2019)  trastuzumab-anns (KANJINTI) 399 mg in sodium chloride 0.9 % 250 mL chemo infusion, 4 mg/kg = 399 mg (100 % of original dose 4 mg/kg), Intravenous,  Once, 14 of 17 cycles. Dose modification: 4 mg/kg (original dose 4 mg/kg, Cycle 1, Reason: Other (see comments), Comment: Biosimilar Conversion; pref by [facility]), 6 mg/kg (original dose 2 mg/kg, Cycle 3, Reason: Provider Judgment), 6 mg/kg (original dose 2 mg/kg, Cycle 4, Reason: Other (see comments), Comment: switch to maintenance q3 weeks), 600 mg (original dose 2 mg/kg, Cycle 4, Reason: Other (see comments), Comment: maint q3 weeks), 525 mg (original dose 2 mg/kg, Cycle 11, Reason: Other (see comments), Comment: Weight loss). Administration: 399 mg (06/27/2019), 189 mg (07/04/2019), 189 mg (07/11/2019), 189 mg (07/18/2019), 189 mg (07/25/2019), 189 mg (08/01/2019), 189 mg (08/08/2019), 189 mg (08/15/2019), 189 mg (08/22/2019), 600 mg (08/29/2019), 600 mg (09/19/2019), 600 mg (10/10/2019), 600 mg (10/31/2019), 600 mg (11/21/2019), 600 mg (12/12/2019), 600 mg (01/02/2020), 525 mg (01/23/2020), 525 mg (02/13/2020), 600 mg (03/05/2020), 600 mg (03/26/2020), 600 mg (04/16/2020)   09/25/2019 - 10/27/2019 Radiation Therapy   The patient initially received a dose of 40.05 Gy in 15 fractions to the breast using whole-breast tangent fields. This was delivered using a 3-D conformal technique. The pt received a boost delivering an additional 10 Gy in 5 fractions using a electron boost with electrons. The total dose was 50.05 Gy.   11/2019 - 11/2026 Anti-estrogen oral therapy   Anastrozole     CHIEF COMPLIANT: Follow-up on anastrozole therapy  HISTORY OF PRESENT ILLNESS:   History of Present Illness The patient, a widow with a history of breast cancer, presents for a routine follow-up. She reports tolerating anastrozole well, with no side  effects. She is approximately four and a half years post-diagnosis and surgery. She also has a history of neuropathy, which is  reportedly stable and not interfering with daily activities. The patient is active, doing her own yard work and caring for her twin granddaughters. She plans to return to the gym soon. She has been attending grief counseling following the death of her husband a year ago.     ALLERGIES:  has no known allergies.  MEDICATIONS:  Current Outpatient Medications  Medication Sig Dispense Refill   ACCU-CHEK AVIVA PLUS test strip      albuterol (VENTOLIN HFA) 108 (90 Base) MCG/ACT inhaler SMARTSIG:1 Puff(s) By Mouth Every 4-6 Hours PRN     anastrozole (ARIMIDEX) 1 MG tablet TAKE 1 TABLET BY MOUTH DAILY 100 tablet 2   aspirin EC 81 MG tablet Take 81 mg by mouth daily.     atorvastatin (LIPITOR) 40 MG tablet Take 40 mg by mouth daily at 6 PM.     Calcium Carb-Cholecalciferol (CALCIUM 600+D3 PO) Take 2 tablets by mouth daily.     cholecalciferol (VITAMIN D3) 25 MCG (1000 UT) tablet Take 1,000 Units by mouth daily.     citalopram (CELEXA) 10 MG tablet Take 10 mg by mouth daily.     Continuous Blood Gluc Sensor (FREESTYLE LIBRE 14 DAY SENSOR) MISC Apply topically every 14 (fourteen) days.     diclofenac Sodium (VOLTAREN) 1 % GEL Apply 2 g topically 4 (four) times daily as needed (knee pain).     hydrochlorothiazide (HYDRODIURIL) 25 MG tablet Take 1 tablet by mouth daily.     LANTUS 100 UNIT/ML injection Inject into the skin.     losartan (COZAAR) 100 MG tablet Take 1 tablet by mouth daily.     metFORMIN (GLUCOPHAGE) 500 MG tablet Take 500 mg by mouth every morning.      metoprolol succinate (TOPROL-XL) 100 MG 24 hr tablet Take 100 mg by mouth daily.     Omega-3 Fatty Acids (FISH OIL OMEGA-3 PO) Take by mouth.     No current facility-administered medications for this visit.    PHYSICAL EXAMINATION: ECOG PERFORMANCE STATUS: 1 - Symptomatic but completely ambulatory  Vitals:   12/09/23 0856  Pulse: (!) 48  Resp: 20  Temp: 98.3 F (36.8 C)  SpO2: 100%   Filed Weights   12/09/23 0856  Weight:  218 lb 1.6 oz (98.9 kg)    Physical Exam No palpable lumps or nodules in bilateral breasts or axilla  (exam performed in the presence of a chaperone)  LABORATORY DATA:  I have reviewed the data as listed    Latest Ref Rng & Units 04/18/2021    4:58 PM 02/12/2021    8:17 AM 10/14/2020   12:34 PM  CMP  Glucose 65 - 99 mg/dL 098  119  147   BUN 7 - 25 mg/dL 18  16  19    Creatinine 0.50 - 1.05 mg/dL 8.29  5.62  1.30   Sodium 135 - 146 mmol/L 139  139  135   Potassium 3.5 - 5.3 mmol/L 4.1  4.3  4.7   Chloride 98 - 110 mmol/L 103  105  100   CO2 20 - 32 mmol/L 27  27  27    Calcium 8.6 - 10.4 mg/dL 9.6  9.0  9.2   Total Protein 6.1 - 8.1 g/dL 7.0     Total Bilirubin 0.2 - 1.2 mg/dL 0.6     AST 10 - 35 U/L 18  ALT 6 - 29 U/L 18       Lab Results  Component Value Date   WBC 7.0 04/18/2021   HGB 12.4 04/18/2021   HCT 38.8 04/18/2021   MCV 90.7 04/18/2021   PLT 234 04/18/2021   NEUTROABS 4.1 05/07/2020    ASSESSMENT & PLAN:  Malignant neoplasm of upper-inner quadrant of right breast in female, estrogen receptor positive (HCC) 04/19/2019:Screening mammogram detected right breast asymmetry. Diagnostic mammogram and US showed 2 indeterminate right breast masses, 6mm at 1 o'clock, 7mm at 12 o'clock, with no axillary adenopathy. 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  Stage Ia   05/30/2019: Right lumpectomy:Right lumpectomy [facility]): IDC with DCIS, grade 3, 0.6cm, clear margins, 3 axillary lymph nodes negative.  HER-2 positive (3+), ER+ (80%), PR -, Ki67 40%.  Stage Ia   Treatment plan: 1. Adjuvant chemo with Taxol Herceptin x9 cycles (stopped for neuropathy) 06/27/2019-08/22/2019, Herceptin completed 06/18/2020 2. Adjuvant radiation therapy 09/26/2019- 10/27/19 3. Adjuvant antiestrogen therapy with anastrozole started  11/21/2019 ------------------------------------------------------------------------------------------------------------------------------------------------------- Current treatment: anastrozole 12/21/2019: Echocardiogram EF 60 to 65%     Chemo-induced peripheral neuropathy: Rates it at 5 out of 10 Participated in PEA study   Breast cancer surveillance:  Mammogram 04/07/2023: Benign breast density category C Bone density: 03/29/2020: T score 0: Normal Breast exam 12/09/2023: Benign   She takes care of her grand daughters who are twins and age 80 years old. Her husband passed away from Alzheimer's in 2024.   Return to clinic in 1 year for follow-up     No orders of the defined types were placed in this encounter.  The patient has a good understanding of the overall plan. she agrees with it. she will call with any problems that may develop before the next visit here. Total time spent: 30 mins including face to face time and time spent for planning, charting and co-ordination of care   Tamsen Meek, MD 12/09/23

## 2023-12-13 ENCOUNTER — Telehealth: Payer: Self-pay | Admitting: Internal Medicine

## 2023-12-13 NOTE — Telephone Encounter (Signed)
 Inbound call from patient requesting to speak with nurse. States she has not heard from [facility] regarding prep medication and has not received it as of yet. Requesting a call back. Please advise, thank you.

## 2023-12-14 ENCOUNTER — Telehealth: Payer: Self-pay | Admitting: Internal Medicine

## 2023-12-14 NOTE — Telephone Encounter (Signed)
 Patient called and stated that she has a procedure tomorrow and would like to know if she can take her insulin today since it was not stated what she should do if you take insulin on the prep instruction. Patient is requesting a call back .Please advise.

## 2023-12-14 NOTE — Telephone Encounter (Signed)
 Spoke with patient. Instructions for lantus on prep instructions. 1/2 daily dose. Hold morning of procedure

## 2023-12-15 ENCOUNTER — Encounter: Payer: Medicare Other | Admitting: Internal Medicine

## 2023-12-15 ENCOUNTER — Encounter: Payer: Self-pay | Admitting: Internal Medicine

## 2023-12-15 ENCOUNTER — Ambulatory Visit: Payer: Medicare Other | Admitting: Internal Medicine

## 2023-12-15 VITALS — BP 142/91 | HR 58 | Temp 97.9°F | Resp 12 | Ht 66.0 in | Wt 212.0 lb

## 2023-12-15 DIAGNOSIS — K5730 Diverticulosis of large intestine without perforation or abscess without bleeding: Secondary | ICD-10-CM | POA: Diagnosis not present

## 2023-12-15 DIAGNOSIS — K635 Polyp of colon: Secondary | ICD-10-CM

## 2023-12-15 DIAGNOSIS — D123 Benign neoplasm of transverse colon: Secondary | ICD-10-CM

## 2023-12-15 DIAGNOSIS — Z1211 Encounter for screening for malignant neoplasm of colon: Secondary | ICD-10-CM

## 2023-12-15 DIAGNOSIS — K648 Other hemorrhoids: Secondary | ICD-10-CM

## 2023-12-15 DIAGNOSIS — D120 Benign neoplasm of cecum: Secondary | ICD-10-CM | POA: Diagnosis not present

## 2023-12-15 MED ORDER — SODIUM CHLORIDE 0.9 % IV SOLN
500.0000 mL | Freq: Once | INTRAVENOUS | Status: DC
Start: 2023-12-15 — End: 2023-12-15

## 2023-12-15 NOTE — Patient Instructions (Signed)

## 2023-12-15 NOTE — Progress Notes (Signed)
 Pt's states no medical or surgical changes since previsit or office visit.

## 2023-12-15 NOTE — Progress Notes (Signed)
 Pt sedate, gd SR's, VSS, report to RN

## 2023-12-15 NOTE — Progress Notes (Signed)
 GASTROENTEROLOGY PROCEDURE H&P NOTE   Primary Care Physician: Fleet Contras, MD    Reason for Procedure:   Colon cancer screening  Plan:    Colonoscopy  Patient is appropriate for endoscopic procedure(s) in the ambulatory ([facility]) setting.  The nature of the procedure, as well as the risks, benefits, and alternatives were carefully and thoroughly reviewed with the patient. Ample time for discussion and questions allowed. The patient understood, was satisfied, and agreed to proceed.     HPI: Kathy Reese is a 73 y.o. female who presents for colonoscopy for colon cancer screening. Denies blood in stools, changes in bowel habits, or unintentional weight loss. Denies family history of colon cancer.  Last colonoscopy was in 2014 with some diverticulosis.  Past Medical History:  Diagnosis Date   Allergic rhinitis, cause unspecified    Allergy    Arthritis    Breast cancer, right (HCC)    COVID-19 virus infection 10/2020   Depression    Diverticulosis    History of Bell's palsy    Hypersomnia    Neuropathy    secondary to chemo   OSA on CPAP    not using at this time 06/15/19   Other and unspecified hyperlipidemia    Personal history of chemotherapy    Personal history of malignant neoplasm of ovary    Personal history of radiation therapy    Type II or unspecified type diabetes mellitus without mention of complication, not stated as uncontrolled    Unspecified essential hypertension    Vertigo     Past Surgical History:  Procedure Laterality Date   ABDOMINAL ADHESION SURGERY  12/96   exc peritoneal cyst   BREAST BIOPSY Left 2000   stereotactic, negative   BREAST BIOPSY Right 05/08/2019   BREAST LUMPECTOMY Right 05/30/2019   BREAST LUMPECTOMY WITH RADIOACTIVE SEED AND SENTINEL LYMPH NODE BIOPSY Right 05/30/2019   Procedure: RIGHT BREAST LUMPECTOMY WITH BRACKETED RADIOACTIVE SEEDS  AND SENTINEL LYMPH NODE BIOPSY;  Surgeon: Almond Lint, MD;  Location: [facility] OR;   Service: General;  Laterality: Right;   BREAST REDUCTION SURGERY Bilateral 10/03   COLONOSCOPY     EYE SURGERY Left    cataracts   LAPAROSCOPIC SALPINGO OOPHERECTOMY Right 12/96   LAPAROSCOPIC SALPINGO OOPHERECTOMY Left 2/08   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    LAPAROSCOPY ABDOMEN DIAGNOSTIC  2/08   w/LOA, LSO   PORT-A-CATH REMOVAL Left 02/12/2021   Procedure: REMOVAL PORT-A-CATH;  Surgeon: Almond Lint, MD;  Location: [facility] ORS;  Service: General;  Laterality: Left;   PORTACATH PLACEMENT Left 06/16/2019   Procedure: INSERTION PORT-A-CATH;  Surgeon: Almond Lint, MD;  Location: [facility] OR;  Service: General;  Laterality: Left;   REDUCTION MAMMAPLASTY Bilateral 2004   TONSILLECTOMY     TOTAL ABDOMINAL HYSTERECTOMY W/ BILATERAL SALPINGOOPHORECTOMY  5/94   secondary to uterine fibroids   TOTAL KNEE ARTHROPLASTY Right 05/12/2013   Procedure: TOTAL KNEE ARTHROPLASTY;  Surgeon: Harvie Junior, MD;  Location: [facility] OR;  Service: Orthopedics;  Laterality: Right;   TRIGGER FINGER RELEASE  07/06/2012   Procedure: RELEASE TRIGGER FINGER/A-1 PULLEY;  Surgeon: Harvie Junior, MD;  Location: [facility];  Service: Orthopedics;  Laterality: Left;  left ring finger   TUBAL LIGATION      Prior to Admission medications   Medication Sig Start Date End Date Taking? Authorizing Provider  ACCU-CHEK AVIVA PLUS test strip  08/26/19  Yes [provider]  anastrozole (ARIMIDEX) 1 MG tablet TAKE 1  TABLET BY MOUTH DAILY 04/26/23  Yes Serena Croissant, MD  aspirin EC 81 MG tablet Take 81 mg by mouth daily.   Yes [provider]  atorvastatin (LIPITOR) 40 MG tablet Take 40 mg by mouth daily at 6 PM.   Yes [provider]  Calcium Carb-Cholecalciferol (CALCIUM 600+D3 PO) Take 2 tablets by mouth daily.   Yes [provider]  cholecalciferol (VITAMIN D3) 25 MCG (1000 UT) tablet Take 1,000 Units by mouth daily.   Yes [provider]  citalopram (CELEXA) 10 MG tablet Take  10 mg by mouth daily. 04/25/20  Yes [provider]  Continuous Blood Gluc Sensor (FREESTYLE LIBRE 14 DAY SENSOR) MISC Apply topically every 14 (fourteen) days. 11/30/20  Yes [provider]  hydrochlorothiazide (HYDRODIURIL) 25 MG tablet Take 1 tablet by mouth daily. 01/20/21  Yes [provider]  LANTUS 100 UNIT/ML injection Inject into the skin.   Yes [provider]  losartan (COZAAR) 100 MG tablet Take 1 tablet by mouth daily. 01/20/21  Yes [provider]  metFORMIN (GLUCOPHAGE) 500 MG tablet Take 500 mg by mouth every morning.    Yes [provider]  metoprolol succinate (TOPROL-XL) 100 MG 24 hr tablet Take 100 mg by mouth daily. 04/25/20  Yes [provider]  Omega-3 Fatty Acids (FISH OIL OMEGA-3 PO) Take by mouth.   Yes [provider]  albuterol (VENTOLIN HFA) 108 (90 Base) MCG/ACT inhaler SMARTSIG:1 Puff(s) By Mouth Every 4-6 Hours PRN 08/23/23   [provider]  diclofenac Sodium (VOLTAREN) 1 % GEL Apply 2 g topically 4 (four) times daily as needed (knee pain). 08/26/20   [provider]    Current Outpatient Medications  Medication Sig Dispense Refill   ACCU-CHEK AVIVA PLUS test strip      anastrozole (ARIMIDEX) 1 MG tablet TAKE 1 TABLET BY MOUTH DAILY 100 tablet 2   aspirin EC 81 MG tablet Take 81 mg by mouth daily.     atorvastatin (LIPITOR) 40 MG tablet Take 40 mg by mouth daily at 6 PM.     Calcium Carb-Cholecalciferol (CALCIUM 600+D3 PO) Take 2 tablets by mouth daily.     cholecalciferol (VITAMIN D3) 25 MCG (1000 UT) tablet Take 1,000 Units by mouth daily.     citalopram (CELEXA) 10 MG tablet Take 10 mg by mouth daily.     Continuous Blood Gluc Sensor (FREESTYLE LIBRE 14 DAY SENSOR) MISC Apply topically every 14 (fourteen) days.     hydrochlorothiazide (HYDRODIURIL) 25 MG tablet Take 1 tablet by mouth daily.     LANTUS 100 UNIT/ML injection Inject into the skin.     losartan (COZAAR) 100 MG  tablet Take 1 tablet by mouth daily.     metFORMIN (GLUCOPHAGE) 500 MG tablet Take 500 mg by mouth every morning.      metoprolol succinate (TOPROL-XL) 100 MG 24 hr tablet Take 100 mg by mouth daily.     Omega-3 Fatty Acids (FISH OIL OMEGA-3 PO) Take by mouth.     albuterol (VENTOLIN HFA) 108 (90 Base) MCG/ACT inhaler SMARTSIG:1 Puff(s) By Mouth Every 4-6 Hours PRN     diclofenac Sodium (VOLTAREN) 1 % GEL Apply 2 g topically 4 (four) times daily as needed (knee pain).     Current Facility-Administered Medications  Medication Dose Route Frequency Provider Last Rate Last Admin   0.9 %  sodium chloride infusion  500 mL Intravenous Once Imogene Burn, MD        Allergies as  of 12/15/2023   (No Known Allergies)    Family History  Problem Relation Age of Onset   Lung cancer Mother    Stroke Sister    Pulmonary Hypertension Sister    Prostate cancer Brother    Stroke Brother    Prostate cancer Brother    Colon polyps Neg Hx    Colon cancer Neg Hx    Esophageal cancer Neg Hx    Rectal cancer Neg Hx    Stomach cancer Neg Hx     Social History   Socioeconomic History   Marital status: Married    Spouse name: Not on file   Number of children: 3   Years of education: Not on file   Highest education level: Not on file  Occupational History   Occupation: CUSTOMER SVC REP    Employer: [employer]  Tobacco Use   Smoking status: Never   Smokeless tobacco: Never  Vaping Use   Vaping status: Never Used  Substance and Sexual Activity   Alcohol use: Not Currently    Alcohol/week: 1.0 standard drink of alcohol    Types: 1 Glasses of wine per week    Comment: occasionally   Drug use: No   Sexual activity: Not Currently    Partners: Male    Birth control/protection: Abstinence  Other Topics Concern   Not on file  Social History Narrative   Not on file   Social Drivers of Health   Financial Resource Strain: Not on file  Food Insecurity: Not on file  Transportation  Needs: Not on file  Physical Activity: Not on file  Stress: Not on file  Social Connections: Not on file  Intimate Partner Violence: Not on file    Physical Exam: Vital signs in last 24 hours: BP (!) 165/78   Pulse 61   Temp 97.9 F (36.6 C)   Resp 16   Ht 5\' 6"  (1.676 m)   Wt 212 lb (96.2 kg)   LMP 02/02/1993 (Exact Date)   SpO2 99%   BMI 34.22 kg/m  GEN: NAD EYE: Sclerae anicteric ENT: MMM CV: Non-tachycardic Pulm: No increased work of breathing GI: Soft, NT/ND NEURO:  Alert & Oriented   Eulah Pont, MD [facility]  12/15/2023 1:33 PM

## 2023-12-15 NOTE — Progress Notes (Signed)
 Called to room to assist during endoscopic procedure.  Patient ID and intended procedure confirmed with present staff. Received instructions for my participation in the procedure from the performing physician.

## 2023-12-15 NOTE — Op Note (Signed)
 [facility] Patient Name: Kathy Reese Procedure Date: 12/15/2023 1:03 PM MRN: [id number] Endoscopist: Madelyn Brunner [place] , , [id number] Age: 73 Referring MD:  Date of Birth: [birth year] Gender: Female Account #: [id number] Procedure:                Colonoscopy Indications:              Screening for colorectal malignant neoplasm Medicines:                Monitored Anesthesia Care Procedure:                Pre-Anesthesia Assessment:                           - Prior to the procedure, a History and Physical                            was performed, and patient medications and                            allergies were reviewed. The patient's tolerance of                            previous anesthesia was also reviewed. The risks                            and benefits of the procedure and the sedation                            options and risks were discussed with the patient.                            All questions were answered, and informed consent                            was obtained. Prior Anticoagulants: The patient has                            taken no anticoagulant or antiplatelet agents. ASA                            Grade Assessment: III - A patient with severe                            systemic disease. After reviewing the risks and                            benefits, the patient was deemed in satisfactory                            condition to undergo the procedure.                           After obtaining informed consent, the colonoscope  was passed under direct vision. Throughout the                            procedure, the patient's blood pressure, pulse, and                            oxygen saturations were monitored continuously. The                            CF [id number] #[id number] was introduced through the anus                            and advanced to the the terminal ileum. The                            colonoscopy  was performed without difficulty. The                            patient tolerated the procedure well. The quality                            of the bowel preparation was good. The terminal                            ileum, ileocecal valve, appendiceal orifice, and                            rectum were photographed. Scope In: 1:39:11 PM Scope Out: 2:00:45 PM Scope Withdrawal Time: 0 hours 13 minutes 31 seconds  Total Procedure Duration: 0 hours 21 minutes 34 seconds  Findings:                 The terminal ileum appeared normal.                           Three sessile polyps were found in the transverse                            colon and cecum. The polyps were 3 to 6 mm in size.                            These polyps were removed with a cold snare.                            Resection and retrieval were complete.                           Multiple diverticula were found in the sigmoid                            colon, descending colon and transverse colon.                           Non-bleeding internal hemorrhoids were found during  retroflexion. Complications:            No immediate complications. Estimated Blood Loss:     Estimated blood loss was minimal. Impression:               - The examined portion of the ileum was normal.                           - Three 3 to 6 mm polyps in the transverse colon                            and in the cecum, removed with a cold snare.                            Resected and retrieved.                           - Diverticulosis in the sigmoid colon, in the                            descending colon and in the transverse colon.                           - Non-bleeding internal hemorrhoids. Recommendation:           - Discharge patient to home (with escort).                           - Await pathology results.                           - The findings and recommendations were discussed                            with  the patient. Dr Particia Lather "Alan Ripper" Leonides Schanz,  12/15/2023 2:11:42 PM

## 2023-12-16 ENCOUNTER — Telehealth: Payer: Self-pay

## 2023-12-16 NOTE — Telephone Encounter (Signed)
  Follow up Call-     12/15/2023   12:56 PM  Call back number  Post procedure Call Back phone  # [phone]  Permission to leave phone message Yes     Patient questions:  Do you have a fever, pain , or abdominal swelling? No. Pain Score  0 *  Have you tolerated food without any problems? Yes.    Have you been able to return to your normal activities? Yes.    Do you have any questions about your discharge instructions: Diet   No. Medications  No. Follow up visit  No.  Do you have questions or concerns about your Care? No.  Actions: * If pain score is 4 or above: No action needed, pain <4.

## 2023-12-20 ENCOUNTER — Encounter: Payer: Self-pay | Admitting: Internal Medicine

## 2023-12-20 LAB — SURGICAL PATHOLOGY

## 2024-01-30 ENCOUNTER — Other Ambulatory Visit: Payer: Self-pay | Admitting: Hematology and Oncology

## 2024-03-13 ENCOUNTER — Other Ambulatory Visit: Payer: Self-pay | Admitting: Internal Medicine

## 2024-03-13 DIAGNOSIS — Z1231 Encounter for screening mammogram for malignant neoplasm of breast: Secondary | ICD-10-CM

## 2024-04-06 ENCOUNTER — Ambulatory Visit
Admission: RE | Admit: 2024-04-06 | Discharge: 2024-04-06 | Disposition: A | Discharge status: Home / Self Care | Source: Ambulatory Visit | Attending: Internal Medicine | Admitting: Internal Medicine

## 2024-04-06 DIAGNOSIS — Z1231 Encounter for screening mammogram for malignant neoplasm of breast: Secondary | ICD-10-CM

## 2024-06-02 ENCOUNTER — Telehealth: Payer: Self-pay | Admitting: *Deleted

## 2024-06-02 NOTE — Telephone Encounter (Signed)
 Spoke with patient, Patient reports intermittent pressure in pelvis, started a few weeks ago, comes and goes. Seen by PCP 1 month ago, tx for UTI per patient.   Denies flank pain, fever, chills, dysuria or bleeding. Hx of hysterectomy. Would like to schedule OV.   Last AEX 07/28/22 -JJ; OV 11/19/22 JJ  OV scheduled for 9/2 at 0800 with Dr. Dallie. ER/Urgent Care precautions reviewed for new/worsening symptoms.   Advised I will send to Dr. Dallie for review, our office will call if any additional recommendations. Patient will schedule next B&P exam when she is in the office.   Routing to Dr. Dallie for review.

## 2024-06-02 NOTE — Progress Notes (Signed)
 73 y.o. 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 Married.  She was getting yearly CA 125, per Dr Eloy she no longer needs these.    She reports pelvic pressure prior to urination. Recently treated for UTI with pcp and symptoms have resolved. Completed abx 3 wk ago. Desires to still be checked. No other symptoms to report today including dysuria, urinary frequency, hematuria.  OB History  Gravida Para Term Preterm AB Living  4 4 3 1  0 3  SAB IAB Ectopic Multiple Live Births  0 0 0 0 4    # Outcome Date GA Lbr Len/2nd Weight Sex Type Anes PTL Lv  4 Term 09/1978 [redacted]w[redacted]d   M Vag-Spont   LIV  3 Term 03/1972 [redacted]w[redacted]d   F Vag-Spont   LIV  2 Term 04/1969 [redacted]w[redacted]d   F Vag-Spont   LIV  1 Preterm      Vag-Spont   ND   Past Medical History:  Diagnosis Date   Allergic rhinitis, cause unspecified    Allergy    Arthritis    Breast cancer, right (HCC)    COVID-19 virus infection 10/2020   Depression    Diverticulosis    History of Bell's palsy    Hypersomnia    Neuropathy    secondary to chemo   OSA on CPAP    not using at this time 06/15/19   Other and unspecified hyperlipidemia    Personal history of chemotherapy    Personal history of malignant neoplasm of ovary    Personal history of radiation therapy    Type II or unspecified type diabetes mellitus without mention of complication, not stated as uncontrolled    Unspecified essential hypertension    Vertigo    Past Surgical History:  Procedure Laterality Date   ABDOMINAL ADHESION SURGERY  12/96   exc peritoneal cyst   BREAST BIOPSY Left 2000   stereotactic, negative   BREAST BIOPSY Right 05/08/2019   BREAST LUMPECTOMY Right 05/30/2019   BREAST LUMPECTOMY WITH RADIOACTIVE SEED AND SENTINEL LYMPH NODE BIOPSY Right 05/30/2019   Procedure: RIGHT BREAST LUMPECTOMY WITH BRACKETED RADIOACTIVE SEEDS  AND  SENTINEL LYMPH NODE BIOPSY;  Surgeon: Aron Shoulders, MD;  Location: [facility];  Service: General;  Laterality: Right;   BREAST REDUCTION SURGERY Bilateral 10/03   COLONOSCOPY     EYE SURGERY Left    cataracts   LAPAROSCOPIC SALPINGO OOPHERECTOMY Right 12/96   LAPAROSCOPIC SALPINGO OOPHERECTOMY Left 2/08   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    LAPAROSCOPY ABDOMEN DIAGNOSTIC  2/08   w/LOA, LSO   PORT-A-CATH REMOVAL Left 02/12/2021   Procedure: REMOVAL PORT-A-CATH;  Surgeon: Aron Shoulders, MD;  Location: [facility];  Service: General;  Laterality: Left;   PORTACATH PLACEMENT Left 06/16/2019   Procedure: INSERTION PORT-A-CATH;  Surgeon: Aron Shoulders, MD;  Location: [facility];  Service: General;  Laterality: Left;   REDUCTION MAMMAPLASTY Bilateral 2004   TONSILLECTOMY     TOTAL ABDOMINAL HYSTERECTOMY W/ BILATERAL SALPINGOOPHORECTOMY  5/94   secondary to uterine fibroids   TOTAL KNEE ARTHROPLASTY Right 05/12/2013   Procedure: TOTAL KNEE ARTHROPLASTY;  Surgeon: Norleen LITTIE Gavel, MD;  Location: [facility];  Service: Orthopedics;  Laterality: Right;   TRIGGER FINGER RELEASE  07/06/2012   Procedure: RELEASE TRIGGER FINGER/A-1 PULLEY;  Surgeon: Norleen LITTIE Gavel, MD;  Location: [facility];  Service:  Orthopedics;  Laterality: Left;  left ring finger   TUBAL LIGATION     Current Outpatient Medications on File Prior to Visit  Medication Sig Dispense Refill   ACCU-CHEK AVIVA PLUS test strip      albuterol  (VENTOLIN  HFA) 108 (90 Base) MCG/ACT inhaler SMARTSIG:1 Puff(s) By Mouth Every 4-6 Hours PRN     anastrozole  (ARIMIDEX ) 1 MG tablet TAKE 1 TABLET BY MOUTH DAILY 100 tablet 2   aspirin  EC 81 MG tablet Take 81 mg by mouth daily.     atorvastatin  (LIPITOR) 40 MG tablet Take 40 mg by mouth daily at 6 PM.     Calcium  Carb-Cholecalciferol (CALCIUM  600+D3 PO) Take 2 tablets by mouth daily.     cholecalciferol (VITAMIN D3) 25 MCG (1000 UT) tablet Take 1,000 Units by mouth daily.     citalopram (CELEXA) 10 MG  tablet Take 10 mg by mouth daily.     Continuous Blood Gluc Sensor (FREESTYLE LIBRE 14 DAY SENSOR) MISC Apply topically every 14 (fourteen) days.     diclofenac Sodium (VOLTAREN) 1 % GEL Apply 2 g topically 4 (four) times daily as needed (knee pain).     hydrochlorothiazide (HYDRODIURIL) 25 MG tablet Take 1 tablet by mouth daily.     hydrOXYzine (ATARAX) 25 MG tablet Take 25 mg by mouth at bedtime as needed.     LANTUS 100 UNIT/ML injection Inject into the skin.     losartan (COZAAR) 100 MG tablet Take 1 tablet by mouth daily.     metFORMIN  (GLUCOPHAGE ) 500 MG tablet Take 500 mg by mouth every morning.      metoprolol  succinate (TOPROL -XL) 100 MG 24 hr tablet Take 100 mg by mouth daily.     Omega-3 Fatty Acids (FISH OIL OMEGA-3 PO) Take by mouth.     No current facility-administered medications on file prior to visit.   No Known Allergies    PE Today's Vitals   06/06/24 0815  BP: (!) 162/70  Pulse: (!) 49  Temp: 97.7 F (36.5 C)  TempSrc: Oral  SpO2: 98%  Weight: 208 lb (94.3 kg)   Body mass index is 33.57 kg/m.  Physical Exam Vitals reviewed. Exam conducted with a chaperone present.  Constitutional:      General: She is not in acute distress.    Appearance: Normal appearance.  HENT:     Head: Normocephalic and atraumatic.     Nose: Nose normal.  Eyes:     Extraocular Movements: Extraocular movements intact.     Conjunctiva/sclera: Conjunctivae normal.  Pulmonary:     Effort: Pulmonary effort is normal.  Genitourinary:    General: Normal vulva.     Exam position: Lithotomy position.     Vagina: Normal. No vaginal discharge.     Uterus: Absent.      Adnexa: Right adnexa normal and left adnexa normal.     Comments: Cervix absent Musculoskeletal:        General: Normal range of motion.     Cervical back: Normal range of motion.  Neurological:     General: No focal deficit present.     Mental Status: She is alert.  Psychiatric:        Mood and Affect: Mood normal.         Behavior: Behavior normal.       Assessment and Plan:        History of UTI  Treated for bladder infection with antibiotics.  Symptoms resolved.  Denies history of recurrent UTI. Normal  pelvic exam No further workup needed at this time  Patient on anastrozole  for history of breast cancer so would not recommend vaginal estrogen.   Vera LULLA Pa, MD

## 2024-06-06 ENCOUNTER — Encounter: Payer: Self-pay | Admitting: Obstetrics and Gynecology

## 2024-06-06 ENCOUNTER — Ambulatory Visit: Admitting: Obstetrics and Gynecology

## 2024-06-06 VITALS — BP 162/70 | HR 49 | Temp 97.7°F | Wt 208.0 lb

## 2024-06-06 DIAGNOSIS — Z87440 Personal history of urinary (tract) infections: Secondary | ICD-10-CM | POA: Diagnosis not present

## 2024-06-20 ENCOUNTER — Ambulatory Visit: Admitting: Obstetrics and Gynecology

## 2024-07-03 NOTE — Progress Notes (Signed)
 73 y.o. 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 Gudena) here for B&P exam- high risk medicare. Widowed, husband passed from Alzheimer's 2024. PCP: Shelda Atlas, MD   She has questions about usefulness of probiotic. Taking fiber to manage constipation. No other abdominal/bowel complaints. H/o SUI, mild. Increased water  intake to voiding more frequently. Nocturia 1-2x/night, drinks through night.  Urine sample provided: Yes  Postmenopausal bleeding: none Pelvic discharge or pain: none Breast mass, nipple discharge or skin changes : none  Sexually active: No  Last PAP: 08/29/13 Last mammogram: 04/06/24 density c, birads 1 Last DXA: 03/29/20 normal Last colonoscopy: 12/15/23 7 yr recall  Exercising: Yes, go to [facility] 4 days a week, walking on weekends Smoker:No  Flowsheet Row Office Visit from 07/04/2024 in [facility]  PHQ-2 Total Score 0      GYN HISTORY: Hx breast cancer, 2020 IC< 57yr of age  40 History  40 Para Term Preterm AB Living  4 4 3 1  0 3  SAB IAB Ectopic Multiple Live Births  0 0 0 0 4    # Outcome Date GA Lbr Len/2nd Weight Sex Type Anes PTL Lv  4 Term 09/1978 [redacted]w[redacted]d   M Vag-Spont   LIV  3 Term 03/1972 [redacted]w[redacted]d   F Vag-Spont   LIV  2 Term 04/1969 [redacted]w[redacted]d   F Vag-Spont   LIV  1 Preterm      Vag-Spont   ND   Past Medical History:  Diagnosis Date   Allergic rhinitis, cause unspecified    Allergy    Arthritis    Breast cancer, right (HCC)    COVID-19 virus infection 10/2020   Depression    Diverticulosis    History of Bell's palsy    Hypersomnia    Neuropathy    secondary to chemo   OSA on CPAP    not using at this time 06/15/19   Other and unspecified hyperlipidemia    Personal history of chemotherapy     Personal history of malignant neoplasm of ovary    Personal history of radiation therapy    Type II or unspecified type diabetes mellitus without mention of complication, not stated as uncontrolled    Unspecified essential hypertension    Vertigo    Past Surgical History:  Procedure Laterality Date   ABDOMINAL ADHESION SURGERY  12/96   exc peritoneal cyst   BREAST BIOPSY Left 2000   stereotactic, negative   BREAST BIOPSY Right 05/08/2019   BREAST LUMPECTOMY Right 05/30/2019   BREAST LUMPECTOMY WITH RADIOACTIVE SEED AND SENTINEL LYMPH NODE BIOPSY Right 05/30/2019   Procedure: RIGHT BREAST LUMPECTOMY WITH BRACKETED RADIOACTIVE SEEDS  AND SENTINEL LYMPH NODE BIOPSY;  Surgeon: Aron Shoulders, MD;  Location: [facility];  Service: General;  Laterality: Right;   BREAST REDUCTION SURGERY Bilateral 10/03   COLONOSCOPY     EYE SURGERY Left    cataracts   LAPAROSCOPIC SALPINGO OOPHERECTOMY Right 12/96   LAPAROSCOPIC SALPINGO OOPHERECTOMY Left 2/08   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    LAPAROSCOPY ABDOMEN DIAGNOSTIC  2/08   w/LOA, LSO   PORT-A-CATH REMOVAL Left 02/12/2021   Procedure: REMOVAL PORT-A-CATH;  Surgeon: Aron Shoulders, MD;  Location: [facility];  Service: General;  Laterality: Left;   PORTACATH PLACEMENT Left 06/16/2019  Procedure: INSERTION PORT-A-CATH;  Surgeon: Aron Shoulders, MD;  Location: [facility];  Service: General;  Laterality: Left;   REDUCTION MAMMAPLASTY Bilateral 2004   TONSILLECTOMY     TOTAL ABDOMINAL HYSTERECTOMY W/ BILATERAL SALPINGOOPHORECTOMY  5/94   secondary to uterine fibroids   TOTAL KNEE ARTHROPLASTY Right 05/12/2013   Procedure: TOTAL KNEE ARTHROPLASTY;  Surgeon: Norleen LITTIE Gavel, MD;  Location: [facility];  Service: Orthopedics;  Laterality: Right;   TRIGGER FINGER RELEASE  07/06/2012   Procedure: RELEASE TRIGGER FINGER/A-1 PULLEY;  Surgeon: Norleen LITTIE Gavel, MD;  Location: [facility];  Service: Orthopedics;  Laterality: Left;  left ring finger   TUBAL LIGATION      Current Outpatient Medications on File Prior to Visit  Medication Sig Dispense Refill   ACCU-CHEK AVIVA PLUS test strip      albuterol  (VENTOLIN  HFA) 108 (90 Base) MCG/ACT inhaler SMARTSIG:1 Puff(s) By Mouth Every 4-6 Hours PRN     anastrozole  (ARIMIDEX ) 1 MG tablet TAKE 1 TABLET BY MOUTH DAILY 100 tablet 2   aspirin  EC 81 MG tablet Take 81 mg by mouth daily.     atorvastatin  (LIPITOR) 40 MG tablet Take 40 mg by mouth daily at 6 PM.     Calcium  Carb-Cholecalciferol (CALCIUM  600+D3 PO) Take 2 tablets by mouth daily.     cholecalciferol (VITAMIN D3) 25 MCG (1000 UT) tablet Take 1,000 Units by mouth daily.     citalopram (CELEXA) 10 MG tablet Take 10 mg by mouth daily.     Continuous Blood Gluc Sensor (FREESTYLE LIBRE 14 DAY SENSOR) MISC Apply topically every 14 (fourteen) days.     diclofenac Sodium (VOLTAREN) 1 % GEL Apply 2 g topically 4 (four) times daily as needed (knee pain).     HUMALOG MIX 75/25 KWIKPEN (75-25) 100 UNIT/ML KwikPen Inject into the skin.     hydrochlorothiazide (HYDRODIURIL) 25 MG tablet Take 1 tablet by mouth daily.     hydrOXYzine (ATARAX) 25 MG tablet Take 25 mg by mouth at bedtime as needed.     losartan (COZAAR) 100 MG tablet Take 1 tablet by mouth daily.     metFORMIN  (GLUCOPHAGE ) 500 MG tablet Take 500 mg by mouth every morning.      metoprolol  succinate (TOPROL -XL) 100 MG 24 hr tablet Take 100 mg by mouth daily.     Omega-3 Fatty Acids (FISH OIL OMEGA-3 PO) Take by mouth.     LANTUS 100 UNIT/ML injection Inject into the skin. (Patient not taking: Reported on 07/04/2024)     No current facility-administered medications on file prior to visit.   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    Employer: [employer]  Tobacco Use   Smoking status: Never    Smokeless tobacco: Never  Vaping Use   Vaping status: Never Used  Substance and Sexual Activity   Alcohol use: Not Currently    Alcohol/week: 1.0 standard drink of alcohol    Types: 1 Glasses of wine per week    Comment: occasionally   Drug use: No   Sexual activity: Not Currently    Partners: Male    Birth control/protection: Abstinence    Comment: gcg- HR- hx breast cancer and IC> 34yr of age  Other Topics Concern  Not on file  Social History Narrative   Not on file   Social Drivers of Health   Financial Resource Strain: Not on file  Food Insecurity: Not on file  Transportation Needs: Not on file  Physical Activity: Not on file  Stress: Not on file  Social Connections: Not on file  Intimate Partner Violence: Not on file   Family History  Problem Relation Age of Onset   Lung cancer Mother    Stroke Sister    Pulmonary Hypertension Sister    Prostate cancer Brother    Stroke Brother    Prostate cancer Brother    Colon polyps Neg Hx    Colon cancer Neg Hx    Esophageal cancer Neg Hx    Rectal cancer Neg Hx    Stomach cancer Neg Hx    BRCA 1/2 Neg Hx    Breast cancer Neg Hx    No Known Allergies    PE Today's Vitals   07/04/24 1156  BP: 138/60  Pulse: (!) 55  Temp: 97.9 F (36.6 C)  TempSrc: Oral  SpO2: 97%  Weight: 209 lb (94.8 kg)  Height: 5' 3.5 (1.613 m)   Body mass index is 36.44 kg/m.  Physical Exam Vitals reviewed. Exam conducted with a chaperone present.  Constitutional:      General: She is not in acute distress.    Appearance: Normal appearance.  HENT:     Head: Normocephalic and atraumatic.     Nose: Nose normal.  Eyes:     Extraocular Movements: Extraocular movements intact.     Conjunctiva/sclera: Conjunctivae normal.  Neck:     Thyroid: No thyroid mass, thyromegaly or thyroid tenderness.  Pulmonary:     Effort: Pulmonary effort is normal.  Chest:     Chest wall: No mass or tenderness.  Breasts:    Right: Normal. No  swelling, mass, nipple discharge or tenderness.     Left: Normal. No swelling, mass, nipple discharge or tenderness.       Comments: Bilateral mammoplasty scars Abdominal:     General: There is no distension.     Palpations: Abdomen is soft.     Tenderness: There is no abdominal tenderness.  Genitourinary:    General: Normal vulva.     Exam position: Lithotomy position.     Urethra: No prolapse.     Vagina: Normal. No vaginal discharge or bleeding.     Cervix: No lesion.     Adnexa: Right adnexa normal and left adnexa normal.     Comments: Cervix and uterus absent Musculoskeletal:        General: Normal range of motion.     Cervical back: Normal range of motion.  Lymphadenopathy:     Upper Body:     Right upper body: No axillary adenopathy.     Left upper body: No axillary adenopathy.     Lower Body: No right inguinal adenopathy. No left inguinal adenopathy.  Skin:    General: Skin is warm and dry.  Neurological:     General: No focal deficit present.     Mental Status: She is alert.  Psychiatric:        Mood and Affect: Mood normal.        Behavior: Behavior normal.      Assessment and Plan:        Encounter for breast and pelvic examination Assessment & Plan: Cervical cancer screening no linger indicated Encouraged annual mammogram screening Colonoscopy UTD DXA UTD Labs and  immunizations with her primary Encouraged safe sexual practices as indicated Encouraged healthy lifestyle practices with diet and exercise For patients over 70yo, I recommend 1200mg  calcium  daily and 800IU of vitamin D  daily.    Negative depression screening  History of breast cancer  Continue to follow w/ Oncology, anastrazole per their management  Vera LULLA Pa, MD

## 2024-07-04 ENCOUNTER — Encounter: Payer: Self-pay | Admitting: Obstetrics and Gynecology

## 2024-07-04 ENCOUNTER — Ambulatory Visit (INDEPENDENT_AMBULATORY_CARE_PROVIDER_SITE_OTHER): Admitting: Obstetrics and Gynecology

## 2024-07-04 ENCOUNTER — Ambulatory Visit: Admitting: Obstetrics and Gynecology

## 2024-07-04 VITALS — BP 138/60 | HR 55 | Temp 97.9°F | Ht 63.5 in | Wt 209.0 lb

## 2024-07-04 DIAGNOSIS — Z853 Personal history of malignant neoplasm of breast: Secondary | ICD-10-CM | POA: Diagnosis not present

## 2024-07-04 DIAGNOSIS — Z1331 Encounter for screening for depression: Secondary | ICD-10-CM

## 2024-07-04 DIAGNOSIS — Z01419 Encounter for gynecological examination (general) (routine) without abnormal findings: Secondary | ICD-10-CM | POA: Insufficient documentation

## 2024-07-04 DIAGNOSIS — Z9289 Personal history of other medical treatment: Secondary | ICD-10-CM

## 2024-07-04 DIAGNOSIS — Z79818 Long term (current) use of other agents affecting estrogen receptors and estrogen levels: Secondary | ICD-10-CM

## 2024-07-04 NOTE — Patient Instructions (Signed)
 For patients under 50-73yo, I recommend 1200mg  calcium  daily and 600IU of vitamin D daily. For patients over 73yo, I recommend 1200mg  calcium  daily and 800IU of vitamin D daily.  Health Maintenance, Female Adopting a healthy lifestyle and getting preventive care are important in promoting health and wellness. Ask your health care provider about: The right schedule for you to have regular tests and exams. Things you can do on your own to prevent diseases and keep yourself healthy. What should I know about diet, weight, and exercise? Eat a healthy diet  Eat a diet that includes plenty of vegetables, fruits, low-fat dairy products, and lean protein. Do not eat a lot of foods that are high in solid fats, added sugars, or sodium. Maintain a healthy weight Body mass index (BMI) is used to identify weight problems. It estimates body fat based on height and weight. Your health care provider can help determine your BMI and help you achieve or maintain a healthy weight. Get regular exercise Get regular exercise. This is one of the most important things you can do for your health. Most adults should: Exercise for at least 150 minutes each week. The exercise should increase your heart rate and make you sweat (moderate-intensity exercise). Do strengthening exercises at least twice a week. This is in addition to the moderate-intensity exercise. Spend less time sitting. Even light physical activity can be beneficial. Watch cholesterol and blood lipids Have your blood tested for lipids and cholesterol at 73 years of age, then have this test every 5 years. Have your cholesterol levels checked more often if: Your lipid or cholesterol levels are high. You are older than 73 years of age. You are at high risk for heart disease. What should I know about cancer screening? Depending on your health history and family history, you may need to have cancer screening at various ages. This may include screening  for: Breast cancer. Cervical cancer. Colorectal cancer. Skin cancer. Lung cancer. What should I know about heart disease, diabetes, and high blood pressure? Blood pressure and heart disease High blood pressure causes heart disease and increases the risk of stroke. This is more likely to develop in people who have high blood pressure readings or are overweight. Have your blood pressure checked: Every 3-5 years if you are 25-57 years of age. Every year if you are 24 years old or older. Diabetes Have regular diabetes screenings. This checks your fasting blood sugar level. Have the screening done: Once every three years after age 62 if you are at a normal weight and have a low risk for diabetes. More often and at a younger age if you are overweight or have a high risk for diabetes. What should I know about preventing infection? Hepatitis B If you have a higher risk for hepatitis B, you should be screened for this virus. Talk with your health care provider to find out if you are at risk for hepatitis B infection. Hepatitis C Testing is recommended for: Everyone born from [birth year] through 1965. Anyone with known risk factors for hepatitis C. Sexually transmitted infections (STIs) Get screened for STIs, including gonorrhea and chlamydia, if: You are sexually active and are younger than 73 years of age. You are older than 73 years of age and your health care provider tells you that you are at risk for this type of infection. Your sexual activity has changed since you were last screened, and you are at increased risk for chlamydia or gonorrhea. Ask your health care provider if  you are at risk. Ask your health care provider about whether you are at high risk for HIV. Your health care provider may recommend a prescription medicine to help prevent HIV infection. If you choose to take medicine to prevent HIV, you should first get tested for HIV. You should then be tested every 3 months for as long as you  are taking the medicine. Osteoporosis and menopause Osteoporosis is a disease in which the bones lose minerals and strength with aging. This can result in bone fractures. If you are 72 years old or older, or if you are at risk for osteoporosis and fractures, ask your health care provider if you should: Be screened for bone loss. Take a calcium  or vitamin D supplement to lower your risk of fractures. Be given hormone replacement therapy (HRT) to treat symptoms of menopause. Follow these instructions at home: Alcohol use Do not drink alcohol if: Your health care provider tells you not to drink. You are pregnant, may be pregnant, or are planning to become pregnant. If you drink alcohol: Limit how much you have to: 0-1 drink a day. Know how much alcohol is in your drink. In the [place] one drink equals one 12 oz bottle of beer (355 mL), one 5 oz glass of wine (148 mL), or one 1 oz glass of hard liquor (44 mL). Lifestyle Do not use any products that contain nicotine or tobacco. These products include cigarettes, chewing tobacco, and vaping devices, such as e-cigarettes. If you need help quitting, ask your health care provider. Do not use street drugs. Do not share needles. Ask your health care provider for help if you need support or information about quitting drugs. General instructions Schedule regular health, dental, and eye exams. Stay current with your vaccines. Tell your health care provider if: You often feel depressed. You have ever been abused or do not feel safe at home. Summary Adopting a healthy lifestyle and getting preventive care are important in promoting health and wellness. Follow your health care provider's instructions about healthy diet, exercising, and getting tested or screened for diseases. Follow your health care provider's instructions on monitoring your cholesterol and blood pressure. This information is not intended to replace advice given to you by your health  care provider. Make sure you discuss any questions you have with your health care provider. Document Revised: 02/10/2021 Document Reviewed: 02/10/2021 Elsevier Patient Education  2024 [employer].

## 2024-07-04 NOTE — Assessment & Plan Note (Addendum)
 Cervical cancer screening no linger indicated Encouraged annual mammogram screening Colonoscopy UTD DXA UTD Labs and immunizations with her primary Encouraged safe sexual practices as indicated Encouraged healthy lifestyle practices with diet and exercise For patients over 73yo, I recommend 1200mg  calcium  daily and 800IU of vitamin D  daily.

## 2024-09-05 ENCOUNTER — Other Ambulatory Visit: Payer: Self-pay | Admitting: Hematology and Oncology

## 2024-12-11 ENCOUNTER — Inpatient Hospital Stay: Admitting: Hematology and Oncology

## 2025-07-05 ENCOUNTER — Encounter: Admitting: Obstetrics and Gynecology
